# Patient Record
Sex: Female | Born: 1935 | Race: Black or African American | Hispanic: No | Smoking: Former smoker
Health system: Southern US, Community
[De-identification: ages and names within clinical notes are randomized; demographics above are authoritative.]

## PROBLEM LIST (undated history)

## (undated) DIAGNOSIS — I2699 Other pulmonary embolism without acute cor pulmonale: Secondary | ICD-10-CM

## (undated) DIAGNOSIS — M3500 Sicca syndrome, unspecified: Secondary | ICD-10-CM

## (undated) DIAGNOSIS — M1990 Unspecified osteoarthritis, unspecified site: Secondary | ICD-10-CM

---

## 1999-03-02 ENCOUNTER — Other Ambulatory Visit: Admission: RE | Admit: 1999-03-02 | Discharge: 1999-03-02 | Payer: Self-pay | Admitting: Internal Medicine

## 1999-03-17 ENCOUNTER — Ambulatory Visit (HOSPITAL_COMMUNITY): Admission: RE | Admit: 1999-03-17 | Discharge: 1999-03-17 | Payer: Self-pay | Admitting: Internal Medicine

## 1999-03-17 ENCOUNTER — Encounter: Payer: Self-pay | Admitting: Internal Medicine

## 2000-03-06 ENCOUNTER — Other Ambulatory Visit: Admission: RE | Admit: 2000-03-06 | Discharge: 2000-03-06 | Payer: Self-pay | Admitting: Internal Medicine

## 2000-04-10 ENCOUNTER — Ambulatory Visit (HOSPITAL_COMMUNITY): Admission: RE | Admit: 2000-04-10 | Discharge: 2000-04-10 | Payer: Self-pay | Admitting: Internal Medicine

## 2000-04-10 ENCOUNTER — Encounter: Payer: Self-pay | Admitting: Internal Medicine

## 2001-09-27 ENCOUNTER — Other Ambulatory Visit: Admission: RE | Admit: 2001-09-27 | Discharge: 2001-09-27 | Payer: Self-pay | Admitting: Internal Medicine

## 2001-10-10 ENCOUNTER — Encounter: Payer: Self-pay | Admitting: Internal Medicine

## 2001-10-10 ENCOUNTER — Encounter: Admission: RE | Admit: 2001-10-10 | Discharge: 2001-10-10 | Payer: Self-pay | Admitting: Internal Medicine

## 2001-10-17 ENCOUNTER — Encounter: Payer: Self-pay | Admitting: Internal Medicine

## 2001-10-17 ENCOUNTER — Ambulatory Visit (HOSPITAL_COMMUNITY): Admission: RE | Admit: 2001-10-17 | Discharge: 2001-10-17 | Payer: Self-pay | Admitting: Internal Medicine

## 2005-12-08 ENCOUNTER — Other Ambulatory Visit: Admission: RE | Admit: 2005-12-08 | Discharge: 2005-12-08 | Payer: Self-pay | Admitting: Internal Medicine

## 2006-01-18 ENCOUNTER — Ambulatory Visit (HOSPITAL_COMMUNITY): Admission: RE | Admit: 2006-01-18 | Discharge: 2006-01-18 | Payer: Self-pay | Admitting: Internal Medicine

## 2006-04-12 ENCOUNTER — Ambulatory Visit (HOSPITAL_COMMUNITY): Admission: RE | Admit: 2006-04-12 | Discharge: 2006-04-12 | Payer: Self-pay | Admitting: Internal Medicine

## 2007-10-23 ENCOUNTER — Emergency Department (HOSPITAL_COMMUNITY): Admission: EM | Admit: 2007-10-23 | Discharge: 2007-10-23 | Payer: Self-pay | Admitting: Emergency Medicine

## 2014-10-21 DIAGNOSIS — H35363 Drusen (degenerative) of macula, bilateral: Secondary | ICD-10-CM | POA: Diagnosis not present

## 2014-10-21 DIAGNOSIS — H2513 Age-related nuclear cataract, bilateral: Secondary | ICD-10-CM | POA: Diagnosis not present

## 2015-01-14 DIAGNOSIS — L438 Other lichen planus: Secondary | ICD-10-CM | POA: Diagnosis not present

## 2015-01-22 DIAGNOSIS — L281 Prurigo nodularis: Secondary | ICD-10-CM | POA: Diagnosis not present

## 2015-01-22 DIAGNOSIS — E538 Deficiency of other specified B group vitamins: Secondary | ICD-10-CM | POA: Diagnosis not present

## 2015-01-22 DIAGNOSIS — R634 Abnormal weight loss: Secondary | ICD-10-CM | POA: Diagnosis not present

## 2015-01-22 DIAGNOSIS — M150 Primary generalized (osteo)arthritis: Secondary | ICD-10-CM | POA: Diagnosis not present

## 2015-01-22 DIAGNOSIS — F0390 Unspecified dementia without behavioral disturbance: Secondary | ICD-10-CM | POA: Diagnosis not present

## 2015-01-22 DIAGNOSIS — M069 Rheumatoid arthritis, unspecified: Secondary | ICD-10-CM | POA: Diagnosis not present

## 2015-01-22 DIAGNOSIS — M1029 Drug-induced gout, multiple sites: Secondary | ICD-10-CM | POA: Diagnosis not present

## 2015-01-22 DIAGNOSIS — Q809 Congenital ichthyosis, unspecified: Secondary | ICD-10-CM | POA: Diagnosis not present

## 2015-02-03 DIAGNOSIS — M3500 Sicca syndrome, unspecified: Secondary | ICD-10-CM | POA: Diagnosis not present

## 2015-02-03 DIAGNOSIS — M069 Rheumatoid arthritis, unspecified: Secondary | ICD-10-CM | POA: Diagnosis not present

## 2015-02-03 DIAGNOSIS — M25551 Pain in right hip: Secondary | ICD-10-CM | POA: Diagnosis not present

## 2017-11-29 ENCOUNTER — Emergency Department (HOSPITAL_BASED_OUTPATIENT_CLINIC_OR_DEPARTMENT_OTHER)
Admission: EM | Admit: 2017-11-29 | Discharge: 2017-11-29 | Disposition: A | Discharge status: Home / Self Care | Payer: Medicare Other | Attending: Emergency Medicine | Admitting: Emergency Medicine

## 2017-11-29 ENCOUNTER — Other Ambulatory Visit: Payer: Self-pay

## 2017-11-29 ENCOUNTER — Emergency Department (HOSPITAL_BASED_OUTPATIENT_CLINIC_OR_DEPARTMENT_OTHER): Payer: Medicare Other

## 2017-11-29 ENCOUNTER — Encounter (HOSPITAL_BASED_OUTPATIENT_CLINIC_OR_DEPARTMENT_OTHER): Payer: Self-pay | Admitting: Emergency Medicine

## 2017-11-29 DIAGNOSIS — Z79899 Other long term (current) drug therapy: Secondary | ICD-10-CM | POA: Insufficient documentation

## 2017-11-29 DIAGNOSIS — W010XXA Fall on same level from slipping, tripping and stumbling without subsequent striking against object, initial encounter: Secondary | ICD-10-CM | POA: Insufficient documentation

## 2017-11-29 DIAGNOSIS — S42292A Other displaced fracture of upper end of left humerus, initial encounter for closed fracture: Secondary | ICD-10-CM | POA: Insufficient documentation

## 2017-11-29 DIAGNOSIS — Y9301 Activity, walking, marching and hiking: Secondary | ICD-10-CM | POA: Insufficient documentation

## 2017-11-29 DIAGNOSIS — Y92008 Other place in unspecified non-institutional (private) residence as the place of occurrence of the external cause: Secondary | ICD-10-CM | POA: Insufficient documentation

## 2017-11-29 DIAGNOSIS — Y999 Unspecified external cause status: Secondary | ICD-10-CM | POA: Insufficient documentation

## 2017-11-29 DIAGNOSIS — S4992XA Unspecified injury of left shoulder and upper arm, initial encounter: Secondary | ICD-10-CM | POA: Diagnosis present

## 2017-11-29 HISTORY — DX: Unspecified osteoarthritis, unspecified site: M19.90

## 2017-11-29 LAB — CBC WITH DIFFERENTIAL/PLATELET
Basophils Absolute: 0 10*3/uL (ref 0.0–0.1)
Basophils Relative: 0 %
Eosinophils Absolute: 0.2 10*3/uL (ref 0.0–0.7)
Eosinophils Relative: 2 %
HCT: 32.2 % — ABNORMAL LOW (ref 36.0–46.0)
Hemoglobin: 11.7 g/dL — ABNORMAL LOW (ref 12.0–15.0)
Lymphocytes Relative: 20 %
Lymphs Abs: 1.5 10*3/uL (ref 0.7–4.0)
MCH: 29.3 pg (ref 26.0–34.0)
MCHC: 36.3 g/dL — ABNORMAL HIGH (ref 30.0–36.0)
MCV: 80.5 fL (ref 78.0–100.0)
Monocytes Absolute: 0.4 10*3/uL (ref 0.1–1.0)
Monocytes Relative: 5 %
Neutro Abs: 5.4 10*3/uL (ref 1.7–7.7)
Neutrophils Relative %: 73 %
Platelets: 284 10*3/uL (ref 150–400)
RBC: 4 MIL/uL (ref 3.87–5.11)
RDW: 16.6 % — ABNORMAL HIGH (ref 11.5–15.5)
WBC: 7.4 10*3/uL (ref 4.0–10.5)

## 2017-11-29 LAB — BASIC METABOLIC PANEL
Anion gap: 14 (ref 5–15)
BUN: 9 mg/dL (ref 8–23)
CO2: 24 mmol/L (ref 22–32)
Calcium: 9.1 mg/dL (ref 8.9–10.3)
Chloride: 101 mmol/L (ref 98–111)
Creatinine, Ser: 0.56 mg/dL (ref 0.44–1.00)
GFR calc Af Amer: >60 mL/min (ref >60)
GFR calc non Af Amer: >60 mL/min (ref >60)
Glucose, Bld: 125 mg/dL — ABNORMAL HIGH (ref 70–99)
Potassium: 3.8 mmol/L (ref 3.5–5.1)
Sodium: 139 mmol/L (ref 135–145)

## 2017-11-29 MED ORDER — LORAZEPAM 2 MG/ML IJ SOLN
0.5000 mg | Freq: Once | INTRAMUSCULAR | Status: AC
  Administered 2017-11-29: 0.5 mg via INTRAVENOUS
  Filled 2017-11-29: qty 1

## 2017-11-29 MED ORDER — OXYCODONE-ACETAMINOPHEN 5-325 MG PO TABS: 1.0000 | ORAL_TABLET | ORAL | 0 refills | Status: DC | PRN

## 2017-11-29 MED ORDER — SODIUM CHLORIDE 0.9 % IV SOLN
INTRAVENOUS | Status: DC
Start: 2017-11-29 — End: 2017-11-30
  Administered 2017-11-29: 20:00:00 via INTRAVENOUS

## 2017-11-29 MED ORDER — HYDROMORPHONE HCL 1 MG/ML IJ SOLN
0.5000 mg | Freq: Once | INTRAMUSCULAR | Status: AC
  Administered 2017-11-29: 0.5 mg via INTRAVENOUS
  Filled 2017-11-29: qty 1

## 2017-11-29 NOTE — ED Notes (Signed)
ED Provider at bedside. 

## 2017-11-29 NOTE — ED Triage Notes (Signed)
Patient tripped and fell on her porch this evening; co left shoulder pain; obvious deformity noted; good sensation and movement to left fingers.

## 2017-11-29 NOTE — ED Notes (Signed)
Patient transported to X-ray 

## 2017-11-29 NOTE — ED Provider Notes (Signed)
[facility] EMERGENCY DEPARTMENT Provider Note   CSN: [id number] Arrival date & time: 11/29/17  1922     History   Chief Complaint Chief Complaint  Patient presents with  . Shoulder Pain    HPI Connie Snyder is a 84 y.o. female.  HPI   84 year old female presenting with left shoulder pain.  She had a mechanical fall just prior to arrival.  She has had severe left shoulder pain since then.  She denies any significant pain elsewhere.  She is not anticoagulated.  She denies any numbness or tingling.  Past Medical History:  Diagnosis Date  . Arthritis     There are no active problems to display for this patient.   History reviewed. No pertinent surgical history.   OB History   None      Home Medications    Prior to Admission medications   Medication Sig Start Date End Date Taking? Authorizing Provider  donepezil (ARICEPT) 5 MG tablet  11/27/17   [provider]  memantine (NAMENDA) 5 MG tablet Take 5 mg by mouth daily. 11/20/17   [provider]  methotrexate (RHEUMATREX) 2.5 MG tablet Take 15 mg by mouth once a week. 11/13/17   [provider]  mirtazapine (REMERON) 15 MG tablet Take 15 mg by mouth every evening. 09/02/17   [provider]  mirtazapine (REMERON) 30 MG tablet TAKE 1 TABLET BEFORE BEDTIME 11/01/17   [provider]  triamterene-hydrochlorothiazide (MAXZIDE-25) 37.5-25 MG tablet Take 0.5 tablets by mouth daily. 09/06/17   [provider]    Family History No family history on file.  Social History Social History   Tobacco Use  . Smoking status: Never Smoker  . Smokeless tobacco: Never Used  Substance Use Topics  . Alcohol use: Not on file  . Drug use: Not on file     Allergies   Patient has no known allergies.   Review of Systems Review of Systems  All systems reviewed and negative, other than as noted in HPI.  Physical Exam Updated Vital Signs BP (!) 154/96 (BP Location:  Right Arm)   Pulse 70   Temp 99.5 F (37.5 C) (Oral)   Resp (!) 25   Ht 5\' 6"  (1.676 m)   Wt 81.6 kg   SpO2 100%   BMI 29.05 kg/m   Physical Exam  Constitutional: She appears well-developed and well-nourished. No distress.  HENT:  Head: Normocephalic and atraumatic.  Eyes: Conjunctivae are normal. Right eye exhibits no discharge. Left eye exhibits no discharge.  Neck: Neck supple.  Cardiovascular: Normal rate, regular rhythm and normal heart sounds. Exam reveals no gallop and no friction rub.  No murmur heard. Pulmonary/Chest: Effort normal and breath sounds normal. No respiratory distress.  Abdominal: Soft. She exhibits no distension. There is no tenderness.  Musculoskeletal: She exhibits tenderness.  Deformity L proximal humerus/shoulder. Severe TTP. Ecchymosis and skin tenting as pictured. Can move all fingers. Good radial pulse. Hard to assess skin sensation over deltoid. Pt says is just hurts. Sensation intact to light touch distally to this area.   Neurological: She is alert.  Skin: Skin is warm and dry.  Psychiatric: She has a normal mood and affect. Her behavior is normal. Thought content normal.  Nursing note and vitals reviewed.        ED Treatments / Results  Labs (all labs ordered are listed, but only abnormal results are displayed) Labs Reviewed  CBC WITH DIFFERENTIAL/PLATELET - Abnormal; Notable for the following  components:      Result Value   Hemoglobin 11.7 (*)    HCT 32.2 (*)    MCHC 36.3 (*)    RDW 16.6 (*)    All other components within normal limits  BASIC METABOLIC PANEL - Abnormal; Notable for the following components:   Glucose, Bld 125 (*)    All other components within normal limits    EKG None  Radiology Dg Shoulder Left  Result Date: 11/29/2017 CLINICAL DATA:  84 year old who tripped and fell on her porch this evening and injured the LEFT shoulder. Initial encounter. EXAM: LEFT SHOULDER - 2+ VIEW COMPARISON:  LEFT shoulder x-rays and  MRI 04/12/2006. FINDINGS: Acute comminuted fracture involving the proximal LEFT humeral metaphysis. Avulsion fracture arising from the humeral head. Narrowed subacromial space. Degenerative changes involving the acromioclavicular joint. Glenohumeral joint anatomically aligned with narrowing of the joint space. Generalized osseous demineralization. IMPRESSION: 1. Acute comminuted fracture involving the proximal LEFT humeral metaphysis. 2. Avulsion fracture arising from the RIGHT head. 3. Narrowed subacromial space indicating chronic supraspinatus tendon disease. 4. Osseous demineralization. 5. Osteoarthritis involving the glenohumeral joint and the AC joint. Electronically Signed   By: Hulan Saas M.D.   On: 11/29/2017 21:03    Procedures Procedures (including critical care time)  Medications Ordered in ED Medications  0.9 %  sodium chloride infusion (has no administration in time range)  LORazepam (ATIVAN) injection 0.5 mg (has no administration in time range)  HYDROmorphone (DILAUDID) injection 0.5 mg (has no administration in time range)     Initial Impression / Assessment and Plan / ED Course  I have reviewed the triage vital signs and the nursing notes.  Pertinent labs & imaging results that were available during my care of the patient were reviewed by me and considered in my medical decision making (see chart for details).      I have reviewed the triage vital signs and the nursing notes. Prior records were reviewed for additional information.    Pertinent labs & imaging results that were available during my care of the patient were reviewed by me and considered in my medical decision making (see chart for details).   89yF with L shoulder pain after mechanical fall. Clinically proximal humerus fracture. Skin is intact but tenting and bony fragment palpable very superficially.   Imaging as above..  This case was discussed with Dr. Magnus Ivan, orthopedic surgery.  He would treat  this like a typical proximal humerus fracture in an elderly patient.  Sling.  As needed pain medication.  Follow-up in the office in the next few days for reassessment.  Care was discussed with patient and her son.  Return precautions discussed.  Final Clinical Impressions(s) / ED Diagnoses   Final diagnoses:  Other closed displaced fracture of proximal end of left humerus, initial encounter    ED Discharge Orders    None       Raeford Razor, MD 11/30/17 1902

## 2017-11-30 ENCOUNTER — Encounter (INDEPENDENT_AMBULATORY_CARE_PROVIDER_SITE_OTHER): Payer: Self-pay | Admitting: Orthopaedic Surgery

## 2017-11-30 ENCOUNTER — Ambulatory Visit (INDEPENDENT_AMBULATORY_CARE_PROVIDER_SITE_OTHER): Payer: Medicare Other | Admitting: Orthopaedic Surgery

## 2017-11-30 ENCOUNTER — Telehealth (INDEPENDENT_AMBULATORY_CARE_PROVIDER_SITE_OTHER): Payer: Self-pay

## 2017-11-30 DIAGNOSIS — S42202A Unspecified fracture of upper end of left humerus, initial encounter for closed fracture: Secondary | ICD-10-CM | POA: Insufficient documentation

## 2017-11-30 MED ORDER — OXYCODONE HCL 5 MG PO TABS: 5.0000 mg | ORAL_TABLET | ORAL | 0 refills | Status: DC | PRN

## 2017-11-30 NOTE — Progress Notes (Signed)
Office Visit Note   Patient: Connie Snyder           Date of Birth: [birth year]           MRN: [id number] Visit Date: 11/30/2017              Requested by: No referring provider defined for this encounter. PCP: Patient, No Pcp Per   Assessment & Plan: Visit Diagnoses:  1. Closed fracture of proximal end of left humerus, unspecified fracture morphology, initial encounter     Plan: Obviously given the combination of her advanced age combined with osteoporotic bone and her anxiety was dementia within a try to hold off on operative intervention.  I do feel that with a sling and gravity that the alignment will likely improve.  I would like to see her back in a week though.  At that visit I would like a single AP view of the left shoulder.  Her son agrees with this plan as well.  All question concerns were answered and addressed.  I will send in some oxycodone as well.  Follow-Up Instructions: Return in about 1 week (around 12/07/2017).   Orders:  No orders of the defined types were placed in this encounter.  Meds ordered this encounter  Medications  . oxyCODONE (ROXICODONE) 5 MG immediate release tablet    Sig: Take 1-2 tablets (5-10 mg total) by mouth every 4 (four) hours as needed for severe pain.    Dispense:  40 tablet    Refill:  0      Procedures: No procedures performed   Clinical Data: No additional findings.   Subjective: No chief complaint on file. The patient comes in with her son today.  She is 84 years old and has slight dementia and some anxiety and unfortunately sustained a mechanical fall last night injuring her left shoulder.  She was seen at [facility] and found to have a complex proximal humerus fracture.  There is slight tenting of the skin and she was placed appropriately sling and follow-up in our office today.  She does report significant left shoulder pain denies other injuries.  Again her son is with her.  HPI  Review of Systems She  currently denies any shortness of breath, chest pain, nausea, vomiting, fever, chills  Objective: Vital Signs: There were no vitals taken for this visit.  Physical Exam She is alert and does follow commands and in obvious discomfort Ortho Exam Examination of her left shoulder shows limited mobility of the shoulder secondary to her fracture.  There is bruising and slight tenting of the skin anteriorly but no significant soft tissue compromise. Specialty Comments:  No specialty comments available.  Imaging: Dg Shoulder Left  Result Date: 11/29/2017 CLINICAL DATA:  84 year old who tripped and fell on her porch this evening and injured the LEFT shoulder. Initial encounter. EXAM: LEFT SHOULDER - 2+ VIEW COMPARISON:  LEFT shoulder x-rays and MRI 04/12/2006. FINDINGS: Acute comminuted fracture involving the proximal LEFT humeral metaphysis. Avulsion fracture arising from the humeral head. Narrowed subacromial space. Degenerative changes involving the acromioclavicular joint. Glenohumeral joint anatomically aligned with narrowing of the joint space. Generalized osseous demineralization. IMPRESSION: 1. Acute comminuted fracture involving the proximal LEFT humeral metaphysis. 2. Avulsion fracture arising from the RIGHT head. 3. Narrowed subacromial space indicating chronic supraspinatus tendon disease. 4. Osseous demineralization. 5. Osteoarthritis involving the glenohumeral joint and the AC joint. Electronically Signed   By: Hulan Saas M.D.   On: 11/29/2017 21:03  Independent review of the x-rays of her left shoulder do show a 2 part to three-part proximal humerus fracture with significant displacement.  PMFS History: Patient Active Problem List   Diagnosis Date Noted  . Closed fracture of left proximal humerus 11/30/2017   Past Medical History:  Diagnosis Date  . Arthritis     History reviewed. No pertinent family history.  History reviewed. No pertinent surgical history. Social History     Occupational History  . Not on file  Tobacco Use  . Smoking status: Never Smoker  . Smokeless tobacco: Never Used  Substance and Sexual Activity  . Alcohol use: Not on file  . Drug use: Not on file  . Sexual activity: Not on file

## 2017-11-30 NOTE — Telephone Encounter (Signed)
Error

## 2017-12-07 ENCOUNTER — Other Ambulatory Visit (INDEPENDENT_AMBULATORY_CARE_PROVIDER_SITE_OTHER): Payer: Self-pay | Admitting: Orthopaedic Surgery

## 2017-12-07 ENCOUNTER — Encounter (INDEPENDENT_AMBULATORY_CARE_PROVIDER_SITE_OTHER): Payer: Self-pay | Admitting: Orthopaedic Surgery

## 2017-12-07 ENCOUNTER — Ambulatory Visit (INDEPENDENT_AMBULATORY_CARE_PROVIDER_SITE_OTHER): Payer: Medicare Other | Admitting: Orthopaedic Surgery

## 2017-12-07 ENCOUNTER — Ambulatory Visit (INDEPENDENT_AMBULATORY_CARE_PROVIDER_SITE_OTHER): Payer: Self-pay

## 2017-12-07 DIAGNOSIS — S42202A Unspecified fracture of upper end of left humerus, initial encounter for closed fracture: Secondary | ICD-10-CM

## 2017-12-07 MED ORDER — OXYCODONE HCL 5 MG PO TABS: 5.0000 mg | ORAL_TABLET | ORAL | 0 refills | Status: DC | PRN

## 2017-12-07 NOTE — Progress Notes (Signed)
The patient is a very elderly 84 year old female who is following up after having sustained a 2 part left proximal humerus fracture.  With that in a sling.  We have been worried about soft tissue compromise due to tenting of the skin.  With gravity and time the skin tenting is less.  There is still some swelling but there is not the same soft tissue compromise we worried about a week ago.  She is moving her elbow and hand fine.  She is been tolerating the pain better.  This is certainly complex fracture.  The x-rays today show that it is displaced proximal humerus fracture.  We will see her in 2 weeks with internal and external rotated views of the left shoulder.  Certainly may still need to proceed with an operative intervention if this does not head in the right direction in terms of healing or soft tissue issues.  The family understands this as well.  We will see what she looks like in 2 weeks.

## 2017-12-21 ENCOUNTER — Encounter (HOSPITAL_BASED_OUTPATIENT_CLINIC_OR_DEPARTMENT_OTHER): Payer: Self-pay

## 2017-12-21 ENCOUNTER — Ambulatory Visit (INDEPENDENT_AMBULATORY_CARE_PROVIDER_SITE_OTHER): Payer: Medicare Other

## 2017-12-21 ENCOUNTER — Other Ambulatory Visit: Payer: Self-pay

## 2017-12-21 ENCOUNTER — Emergency Department (HOSPITAL_BASED_OUTPATIENT_CLINIC_OR_DEPARTMENT_OTHER): Payer: Medicare Other

## 2017-12-21 ENCOUNTER — Ambulatory Visit (INDEPENDENT_AMBULATORY_CARE_PROVIDER_SITE_OTHER): Payer: Medicare Other | Admitting: Orthopaedic Surgery

## 2017-12-21 ENCOUNTER — Inpatient Hospital Stay (HOSPITAL_BASED_OUTPATIENT_CLINIC_OR_DEPARTMENT_OTHER)
Admission: EM | Admit: 2017-12-21 | Discharge: 2017-12-28 | DRG: 470 | Disposition: A | Discharge status: Skilled Nursing Facility | Payer: Medicare Other | Attending: Internal Medicine | Admitting: Internal Medicine

## 2017-12-21 ENCOUNTER — Encounter (INDEPENDENT_AMBULATORY_CARE_PROVIDER_SITE_OTHER): Payer: Self-pay | Admitting: Orthopaedic Surgery

## 2017-12-21 DIAGNOSIS — S42202D Unspecified fracture of upper end of left humerus, subsequent encounter for fracture with routine healing: Secondary | ICD-10-CM

## 2017-12-21 DIAGNOSIS — I447 Left bundle-branch block, unspecified: Secondary | ICD-10-CM | POA: Diagnosis not present

## 2017-12-21 DIAGNOSIS — M25552 Pain in left hip: Secondary | ICD-10-CM | POA: Diagnosis present

## 2017-12-21 DIAGNOSIS — D62 Acute posthemorrhagic anemia: Secondary | ICD-10-CM | POA: Diagnosis not present

## 2017-12-21 DIAGNOSIS — S72002A Fracture of unspecified part of neck of left femur, initial encounter for closed fracture: Secondary | ICD-10-CM | POA: Diagnosis not present

## 2017-12-21 DIAGNOSIS — E440 Moderate protein-calorie malnutrition: Secondary | ICD-10-CM | POA: Diagnosis not present

## 2017-12-21 DIAGNOSIS — F0390 Unspecified dementia without behavioral disturbance: Secondary | ICD-10-CM | POA: Diagnosis not present

## 2017-12-21 DIAGNOSIS — Z7982 Long term (current) use of aspirin: Secondary | ICD-10-CM | POA: Diagnosis not present

## 2017-12-21 DIAGNOSIS — S72042A Displaced fracture of base of neck of left femur, initial encounter for closed fracture: Secondary | ICD-10-CM | POA: Diagnosis not present

## 2017-12-21 DIAGNOSIS — D649 Anemia, unspecified: Secondary | ICD-10-CM | POA: Diagnosis not present

## 2017-12-21 DIAGNOSIS — M069 Rheumatoid arthritis, unspecified: Secondary | ICD-10-CM | POA: Diagnosis not present

## 2017-12-21 DIAGNOSIS — W19XXXA Unspecified fall, initial encounter: Secondary | ICD-10-CM

## 2017-12-21 DIAGNOSIS — Z8781 Personal history of (healed) traumatic fracture: Secondary | ICD-10-CM

## 2017-12-21 DIAGNOSIS — Z419 Encounter for procedure for purposes other than remedying health state, unspecified: Secondary | ICD-10-CM

## 2017-12-21 DIAGNOSIS — Z6829 Body mass index (BMI) 29.0-29.9, adult: Secondary | ICD-10-CM

## 2017-12-21 DIAGNOSIS — S42202A Unspecified fracture of upper end of left humerus, initial encounter for closed fracture: Secondary | ICD-10-CM | POA: Diagnosis present

## 2017-12-21 DIAGNOSIS — Z9181 History of falling: Secondary | ICD-10-CM

## 2017-12-21 DIAGNOSIS — M3500 Sicca syndrome, unspecified: Secondary | ICD-10-CM | POA: Diagnosis not present

## 2017-12-21 DIAGNOSIS — T148XXA Other injury of unspecified body region, initial encounter: Secondary | ICD-10-CM

## 2017-12-21 DIAGNOSIS — W19XXXD Unspecified fall, subsequent encounter: Secondary | ICD-10-CM | POA: Diagnosis present

## 2017-12-21 DIAGNOSIS — Z96649 Presence of unspecified artificial hip joint: Secondary | ICD-10-CM

## 2017-12-21 DIAGNOSIS — I10 Essential (primary) hypertension: Secondary | ICD-10-CM | POA: Diagnosis present

## 2017-12-21 DIAGNOSIS — W108XXA Fall (on) (from) other stairs and steps, initial encounter: Secondary | ICD-10-CM | POA: Diagnosis present

## 2017-12-21 HISTORY — DX: Sjogren syndrome, unspecified: M35.00

## 2017-12-21 LAB — BASIC METABOLIC PANEL
Anion gap: 9 (ref 5–15)
BUN: 11 mg/dL (ref 8–23)
CALCIUM: 8.8 mg/dL — AB (ref 8.9–10.3)
CO2: 28 mmol/L (ref 22–32)
Chloride: 104 mmol/L (ref 98–111)
Creatinine, Ser: 0.48 mg/dL (ref 0.44–1.00)
GFR calc non Af Amer: >60 mL/min (ref >60)
Glucose, Bld: 97 mg/dL (ref 70–99)
Potassium: 4 mmol/L (ref 3.5–5.1)
SODIUM: 141 mmol/L (ref 135–145)

## 2017-12-21 LAB — CBC WITH DIFFERENTIAL/PLATELET
BASOS PCT: 1 %
Basophils Absolute: 0 10*3/uL (ref 0.0–0.1)
Eosinophils Absolute: 0.2 10*3/uL (ref 0.0–0.7)
Eosinophils Relative: 3 %
HCT: 30.1 % — ABNORMAL LOW (ref 36.0–46.0)
HEMOGLOBIN: 10.7 g/dL — AB (ref 12.0–15.0)
Lymphocytes Relative: 10 %
Lymphs Abs: 0.8 10*3/uL (ref 0.7–4.0)
MCH: 29.4 pg (ref 26.0–34.0)
MCHC: 35.5 g/dL (ref 30.0–36.0)
MCV: 82.7 fL (ref 78.0–100.0)
MONOS PCT: 7 %
Monocytes Absolute: 0.5 10*3/uL (ref 0.1–1.0)
NEUTROS PCT: 79 %
Neutro Abs: 6.2 10*3/uL (ref 1.7–7.7)
PLATELETS: 416 10*3/uL — AB (ref 150–400)
RBC: 3.64 MIL/uL — AB (ref 3.87–5.11)
RDW: 16.2 % — ABNORMAL HIGH (ref 11.5–15.5)
WBC: 7.8 10*3/uL (ref 4.0–10.5)

## 2017-12-21 MED ORDER — MORPHINE SULFATE (PF) 4 MG/ML IV SOLN
4.0000 mg | INTRAVENOUS | Status: AC | PRN
  Administered 2017-12-21 (×2): 4 mg via INTRAVENOUS
  Filled 2017-12-21 (×2): qty 1

## 2017-12-21 MED ORDER — ONDANSETRON HCL 4 MG/2ML IJ SOLN
4.0000 mg | Freq: Four times a day (QID) | INTRAMUSCULAR | Status: DC | PRN
  Administered 2017-12-21: 4 mg via INTRAVENOUS
  Filled 2017-12-21: qty 2

## 2017-12-21 MED ORDER — OXYCODONE HCL 5 MG PO TABS: 5.0000 mg | ORAL_TABLET | ORAL | 0 refills | Status: DC | PRN

## 2017-12-21 NOTE — ED Notes (Signed)
[facility] notified (Kim) - hospitalist consult @ [facility] 

## 2017-12-21 NOTE — ED Triage Notes (Addendum)
Pt was walking into her house and lost her footing and fell, c/o left upper leg and hip pain, already has a broken left arm that is in a sling and apparently just recovered from an injury to the right leg, no shortening, no rotation, but pt does not want to extend leg and is tender to touch

## 2017-12-21 NOTE — ED Notes (Signed)
Attempted to call report to 5N; spoke with Greggory Stallion, RN; he will call back for report.

## 2017-12-21 NOTE — Plan of Care (Signed)
  Problem: Education: Goal: Knowledge of General Education information will improve Description: Including pain rating scale, medication(s)/side effects and non-pharmacologic comfort measures Outcome: Progressing   Problem: Pain Managment: Goal: General experience of comfort will improve Outcome: Progressing   Problem: Safety: Goal: Ability to remain free from injury will improve Outcome: Progressing   

## 2017-12-21 NOTE — ED Notes (Signed)
Attempted to obtain IV access x 2

## 2017-12-21 NOTE — Progress Notes (Addendum)
Transfer from [facility] for hip fracture, secondary to mechanical fall.  Recent closed fracture of the humerus 3 weeks ago, follows with Dr. Magnus Ivan today.  Dr. Roda Shutters with [facility] orthopedist will see patient.  Admit order inpatient MedSurg.  Wendall Stade, MD. TRH.  LOS- NO CHARGE.

## 2017-12-21 NOTE — ED Notes (Signed)
Report to Greggory Stallion, RN at [facility]N.

## 2017-12-21 NOTE — ED Provider Notes (Signed)
[facility] EMERGENCY DEPARTMENT Provider Note   CSN: [id number] Arrival date & time: 12/21/17  1751     History   Chief Complaint Chief Complaint  Patient presents with  . Fall    HPI Connie Snyder is a 84 y.o. female, Sjgrens and arthritis, presents s/p mechanical fall. Pt states she was walking up the steps to her house when she missed a step and fell. She landed on her left hip. Pt's son at bedside was present for the fall and verified HPI. Pt denies hitting her head, LOC. Pt has a known left humeral fx from a fall 3 weeks ago. She has been followed by Dr. Magnus Ivan for that fracture. Pt denies any other pain. She denies any numbness, tingling, weakness, syncope. The history is provided by the patient and a relative.  Fall  This is a new problem. The current episode started less than 1 hour ago. Pertinent negatives include no chest pain, no abdominal pain, no headaches and no shortness of breath.    Past Medical History:  Diagnosis Date  . Arthritis   . Sjogren's disease [facility])     Patient Active Problem List   Diagnosis Date Noted  . Closed fracture of left proximal humerus 11/30/2017    History reviewed. No pertinent surgical history.   OB History   None      Home Medications    Prior to Admission medications   Medication Sig Start Date End Date Taking? Authorizing Provider  donepezil (ARICEPT) 5 MG tablet  11/27/17   [provider]  memantine (NAMENDA) 5 MG tablet Take 5 mg by mouth daily. 11/20/17   [provider]  methotrexate (RHEUMATREX) 2.5 MG tablet Take 15 mg by mouth once a week. 11/13/17   [provider]  mirtazapine (REMERON) 15 MG tablet Take 15 mg by mouth every evening. 09/02/17   [provider]  mirtazapine (REMERON) 30 MG tablet TAKE 1 TABLET BEFORE BEDTIME 11/01/17   [provider]  oxyCODONE (ROXICODONE) 5 MG immediate release tablet Take 1-2 tablets (5-10 mg total) by mouth every 4  (four) hours as needed for severe pain. 12/21/17   Kathryne Hitch, MD  oxyCODONE-acetaminophen (PERCOCET/ROXICET) 5-325 MG tablet Take 1 tablet by mouth every 4 (four) hours as needed for severe pain. 11/29/17   Raeford Razor, MD  triamterene-hydrochlorothiazide (MAXZIDE-25) 37.5-25 MG tablet Take 0.5 tablets by mouth daily. 09/06/17   [provider]    Family History No family history on file.  Social History Social History   Tobacco Use  . Smoking status: Never Smoker  . Smokeless tobacco: Never Used  Substance Use Topics  . Alcohol use: Not on file  . Drug use: Not on file     Allergies   Patient has no known allergies.   Review of Systems Review of Systems  Constitutional: Negative for chills and fever.  HENT: Negative for rhinorrhea and sore throat.   Eyes: Negative for visual disturbance.  Respiratory: Negative for cough and shortness of breath.   Cardiovascular: Negative for chest pain and leg swelling.  Gastrointestinal: Negative for abdominal pain, diarrhea, nausea and vomiting.  Genitourinary: Negative for dysuria, frequency and urgency.  Musculoskeletal: Positive for arthralgias (left hip pain). Negative for back pain and neck pain.  Skin: Negative for rash and wound.  Neurological: Negative for syncope, numbness and headaches.  All other systems reviewed and are negative.    Physical Exam Updated Vital Signs BP (!) 160/71 (BP Location: Right  Arm)   Pulse 87   Temp 100.1 F (37.8 C) (Oral)   Resp 20   Ht 5\' 6"  (1.676 m)   Wt 81.6 kg   SpO2 100%   BMI 29.04 kg/m   Physical Exam  Constitutional: She is oriented to person, place, and time. She appears well-developed and well-nourished.  HENT:  Head: Normocephalic and atraumatic.  Eyes: Conjunctivae and EOM are normal.  Neck: Neck supple.  Cardiovascular: Normal rate, regular rhythm and normal heart sounds.  No murmur heard. Pulmonary/Chest: Effort normal and breath sounds normal. No  respiratory distress. She has no wheezes. She has no rales.  Abdominal: Soft. Bowel sounds are normal. She exhibits no distension. There is no tenderness.  Musculoskeletal:       Left shoulder: She exhibits decreased range of motion (known left humeral fx, skin tenting noted, no change from imaging several weeks ago), tenderness and bony tenderness.       Left hip: She exhibits decreased range of motion and bony tenderness (tender over the lateral left hip at the femoral head). She exhibits no swelling and no crepitus.  Left leg externally rotated and shortened  Neurological: She is alert and oriented to person, place, and time.  Skin: Skin is warm and dry. No rash noted. No erythema.  Nursing note and vitals reviewed.    ED Treatments / Results  Labs (all labs ordered are listed, but only abnormal results are displayed) Labs Reviewed  BASIC METABOLIC PANEL  CBC WITH DIFFERENTIAL/PLATELET    EKG None  Radiology Xr Humerus Left  Result Date: 12/21/2017 2 views of the left proximal humerus show a 2 part proximal humerus fracture with slight interval healing.   Procedures Procedures (including critical care time)  Medications Ordered in ED Medications  morphine 4 MG/ML injection 4 mg (has no administration in time range)  ondansetron (ZOFRAN) injection 4 mg (has no administration in time range)     Initial Impression / Assessment and Plan / ED Course  I have reviewed the triage vital signs and the nursing notes.  Pertinent labs & imaging results that were available during my care of the patient were reviewed by me and considered in my medical decision making (see chart for details).  Clinical Course as of Dec 21 2000  Thu Dec 21, 2017  1931 DG FEMUR MIN 2 VIEWS LEFT [CK]    Clinical Course User Index [CK] Dec 23, 2017, PA-C    8:03 PM Pt has left hip fracture. Pt resting comfortably in bed, no acute distress. Pt declined more pain medication. Spoke with Dr.  Clayborne Artist who states he has a full schedule tomorrow. Recommends admitting to [facility] hospitalitis for Dr. [facility] to do the case.  8:16 PM Dr. Rhona Leavens accepted pt to [facility]   Final Clinical Impressions(s) / ED Diagnoses   Final diagnoses:  None    ED Discharge Orders    None       [facility], PA-C 12/21/17 2355    12/23/17, DO 12/22/17 [phone]

## 2017-12-21 NOTE — ED Notes (Signed)
Pt assisted to use bedpan; voided w/o difficulty

## 2017-12-21 NOTE — Progress Notes (Signed)
The patient is a 84 year old who is now 3 weeks status post a complex left proximal humerus fracture.  All this was just a 2 part fracture, it was causing some soft tissue compromise.  With patterns sling.  She is actually feeling better this recently.  Out of the sling I can put her through some internal extra rotation it seems to move the unit and she seems to be less uncomfortable.  Her son is with her says most of her pain is at night.  He does wish to have a refill of oxycodone.  2 views of the left shoulder show that the humeral head is well located.  I do feel the alignment of the fracture is improving.  At this point given her improved clinical exam, we will have her still use a sling with coming in and out of it as comfort allows.  I like to see her back in 4 weeks with an AP of the left shoulder.

## 2017-12-22 ENCOUNTER — Encounter (HOSPITAL_COMMUNITY): Payer: Self-pay | Admitting: Internal Medicine

## 2017-12-22 ENCOUNTER — Inpatient Hospital Stay (HOSPITAL_COMMUNITY): Payer: Medicare Other

## 2017-12-22 ENCOUNTER — Inpatient Hospital Stay (HOSPITAL_COMMUNITY): Payer: Medicare Other | Admitting: Anesthesiology

## 2017-12-22 ENCOUNTER — Encounter (HOSPITAL_COMMUNITY)
Admission: EM | Disposition: A | Discharge status: Skilled Nursing Facility | Payer: Self-pay | Source: Home / Self Care | Attending: Internal Medicine

## 2017-12-22 DIAGNOSIS — Z8781 Personal history of (healed) traumatic fracture: Secondary | ICD-10-CM

## 2017-12-22 DIAGNOSIS — S72042A Displaced fracture of base of neck of left femur, initial encounter for closed fracture: Secondary | ICD-10-CM

## 2017-12-22 DIAGNOSIS — E440 Moderate protein-calorie malnutrition: Secondary | ICD-10-CM

## 2017-12-22 DIAGNOSIS — D649 Anemia, unspecified: Secondary | ICD-10-CM | POA: Diagnosis present

## 2017-12-22 HISTORY — PX: TOTAL HIP ARTHROPLASTY: SHX124

## 2017-12-22 LAB — CBC
HCT: 28.5 % — ABNORMAL LOW (ref 36.0–46.0)
HEMATOCRIT: 30.4 % — AB (ref 36.0–46.0)
HEMOGLOBIN: 10.1 g/dL — AB (ref 12.0–15.0)
HEMOGLOBIN: 10.4 g/dL — AB (ref 12.0–15.0)
MCH: 29.7 pg (ref 26.0–34.0)
MCH: 28.9 pg (ref 26.0–34.0)
MCHC: 35.4 g/dL (ref 30.0–36.0)
MCHC: 34.2 g/dL (ref 30.0–36.0)
MCV: 83.8 fL (ref 78.0–100.0)
MCV: 84.4 fL (ref 78.0–100.0)
PLATELETS: 345 10*3/uL (ref 150–400)
Platelets: 372 10*3/uL (ref 150–400)
RBC: 3.4 MIL/uL — ABNORMAL LOW (ref 3.87–5.11)
RBC: 3.6 MIL/uL — ABNORMAL LOW (ref 3.87–5.11)
RDW: 15.9 % — ABNORMAL HIGH (ref 11.5–15.5)
RDW: 16.2 % — AB (ref 11.5–15.5)
WBC: 10.9 10*3/uL — ABNORMAL HIGH (ref 4.0–10.5)
WBC: 19.3 10*3/uL — AB (ref 4.0–10.5)

## 2017-12-22 LAB — URINALYSIS, ROUTINE W REFLEX MICROSCOPIC
Bilirubin Urine: NEGATIVE
GLUCOSE, UA: NEGATIVE mg/dL
HGB URINE DIPSTICK: NEGATIVE
Ketones, ur: NEGATIVE mg/dL
Leukocytes, UA: NEGATIVE
Nitrite: NEGATIVE
PROTEIN: NEGATIVE mg/dL
Specific Gravity, Urine: 1.011 (ref 1.005–1.030)
pH: 9 — ABNORMAL HIGH (ref 5.0–8.0)

## 2017-12-22 LAB — SURGICAL PCR SCREEN
MRSA, PCR: NEGATIVE
STAPHYLOCOCCUS AUREUS: POSITIVE — AB

## 2017-12-22 LAB — ABO/RH: ABO/RH(D): B POS

## 2017-12-22 LAB — BASIC METABOLIC PANEL
ANION GAP: 7 (ref 5–15)
BUN: 7 mg/dL — ABNORMAL LOW (ref 8–23)
CALCIUM: 8.6 mg/dL — AB (ref 8.9–10.3)
CO2: 26 mmol/L (ref 22–32)
CREATININE: 0.52 mg/dL (ref 0.44–1.00)
Chloride: 105 mmol/L (ref 98–111)
GFR calc Af Amer: >60 mL/min (ref >60)
GLUCOSE: 107 mg/dL — AB (ref 70–99)
Potassium: 4.1 mmol/L (ref 3.5–5.1)
Sodium: 138 mmol/L (ref 135–145)

## 2017-12-22 LAB — CREATININE, SERUM: CREATININE: 0.54 mg/dL (ref 0.44–1.00)

## 2017-12-22 LAB — TROPONIN I

## 2017-12-22 SURGERY — ARTHROPLASTY, HIP, TOTAL, ANTERIOR APPROACH
Anesthesia: General | Site: Hip | Laterality: Left

## 2017-12-22 MED ORDER — SENNOSIDES-DOCUSATE SODIUM 8.6-50 MG PO TABS: 1.0000 | ORAL_TABLET | Freq: Every evening | ORAL | Status: DC | PRN

## 2017-12-22 MED ORDER — MEMANTINE HCL 10 MG PO TABS
5.0000 mg | ORAL_TABLET | Freq: Every day | ORAL | Status: DC
  Administered 2017-12-23 – 2017-12-28 (×6): 5 mg via ORAL
  Filled 2017-12-22 (×6): qty 1

## 2017-12-22 MED ORDER — POVIDONE-IODINE 10 % EX SWAB: 2.0000 "application " | Freq: Once | CUTANEOUS | Status: DC

## 2017-12-22 MED ORDER — ACETAMINOPHEN 325 MG PO TABS
325.0000 mg | ORAL_TABLET | Freq: Four times a day (QID) | ORAL | Status: DC | PRN
  Administered 2017-12-23: 325 mg via ORAL
  Administered 2017-12-24 – 2017-12-28 (×5): 650 mg via ORAL
  Filled 2017-12-22 (×2): qty 2
  Filled 2017-12-22: qty 1
  Filled 2017-12-22 (×3): qty 2

## 2017-12-22 MED ORDER — FENTANYL CITRATE (PF) 100 MCG/2ML IJ SOLN
INTRAMUSCULAR | Status: AC
  Filled 2017-12-22: qty 2

## 2017-12-22 MED ORDER — POLYETHYLENE GLYCOL 3350 17 G PO PACK: 17.0000 g | PACK | Freq: Every day | ORAL | Status: DC | PRN

## 2017-12-22 MED ORDER — METOCLOPRAMIDE HCL 5 MG/ML IJ SOLN
5.0000 mg | Freq: Once | INTRAMUSCULAR | Status: AC
  Administered 2017-12-22: 5 mg via INTRAVENOUS

## 2017-12-22 MED ORDER — ACETAMINOPHEN 500 MG PO TABS
500.0000 mg | ORAL_TABLET | Freq: Four times a day (QID) | ORAL | Status: AC
  Administered 2017-12-22 – 2017-12-23 (×4): 500 mg via ORAL
  Filled 2017-12-22 (×4): qty 1

## 2017-12-22 MED ORDER — ONDANSETRON HCL 4 MG/2ML IJ SOLN: 4.0000 mg | Freq: Four times a day (QID) | INTRAMUSCULAR | Status: DC | PRN

## 2017-12-22 MED ORDER — ONDANSETRON HCL 4 MG PO TABS: 4.0000 mg | ORAL_TABLET | Freq: Four times a day (QID) | ORAL | Status: DC | PRN

## 2017-12-22 MED ORDER — PHENOL 1.4 % MT LIQD: 1.0000 | OROMUCOSAL | Status: DC | PRN

## 2017-12-22 MED ORDER — SUGAMMADEX SODIUM 200 MG/2ML IV SOLN
INTRAVENOUS | Status: DC | PRN
  Administered 2017-12-22: 200 mg via INTRAVENOUS

## 2017-12-22 MED ORDER — MORPHINE SULFATE (PF) 2 MG/ML IV SOLN: 1.0000 mg | INTRAVENOUS | Status: DC | PRN

## 2017-12-22 MED ORDER — HYDROCODONE-ACETAMINOPHEN 5-325 MG PO TABS
1.0000 | ORAL_TABLET | ORAL | Status: DC | PRN
  Administered 2017-12-27: 2 via ORAL
  Filled 2017-12-22: qty 2

## 2017-12-22 MED ORDER — ROCURONIUM BROMIDE 50 MG/5ML IV SOSY
PREFILLED_SYRINGE | INTRAVENOUS | Status: DC | PRN
  Administered 2017-12-22: 10 mg via INTRAVENOUS
  Administered 2017-12-22: 40 mg via INTRAVENOUS

## 2017-12-22 MED ORDER — SODIUM CHLORIDE 0.9 % IR SOLN
Status: DC | PRN
  Administered 2017-12-22: 3000 mL

## 2017-12-22 MED ORDER — DEXAMETHASONE SODIUM PHOSPHATE 10 MG/ML IJ SOLN
INTRAMUSCULAR | Status: AC
  Filled 2017-12-22: qty 1

## 2017-12-22 MED ORDER — CEFAZOLIN SODIUM-DEXTROSE 2-4 GM/100ML-% IV SOLN
2.0000 g | Freq: Four times a day (QID) | INTRAVENOUS | Status: AC
  Administered 2017-12-22 – 2017-12-23 (×3): 2 g via INTRAVENOUS
  Filled 2017-12-22 (×3): qty 100

## 2017-12-22 MED ORDER — LACTATED RINGERS IV SOLN
INTRAVENOUS | Status: DC
  Administered 2017-12-22 (×2): via INTRAVENOUS

## 2017-12-22 MED ORDER — SORBITOL 70 % SOLN: 30.0000 mL | Freq: Every day | Status: DC | PRN

## 2017-12-22 MED ORDER — FENTANYL CITRATE (PF) 250 MCG/5ML IJ SOLN
INTRAMUSCULAR | Status: AC
  Filled 2017-12-22: qty 5

## 2017-12-22 MED ORDER — 0.9 % SODIUM CHLORIDE (POUR BTL) OPTIME
TOPICAL | Status: DC | PRN
  Administered 2017-12-22: 1000 mL

## 2017-12-22 MED ORDER — ENOXAPARIN SODIUM 40 MG/0.4ML ~~LOC~~ SOLN: 40.0000 mg | Freq: Every day | SUBCUTANEOUS | 0 refills | Status: DC

## 2017-12-22 MED ORDER — MIRTAZAPINE 15 MG PO TABS
30.0000 mg | ORAL_TABLET | Freq: Every day | ORAL | Status: DC
  Administered 2017-12-22 – 2017-12-27 (×7): 30 mg via ORAL
  Filled 2017-12-22 (×7): qty 2

## 2017-12-22 MED ORDER — MORPHINE SULFATE (PF) 2 MG/ML IV SOLN
0.5000 mg | INTRAVENOUS | Status: DC | PRN
  Administered 2017-12-22: 0.5 mg via INTRAVENOUS
  Filled 2017-12-22: qty 1

## 2017-12-22 MED ORDER — LIDOCAINE 2% (20 MG/ML) 5 ML SYRINGE
INTRAMUSCULAR | Status: DC | PRN
  Administered 2017-12-22: 80 mg via INTRAVENOUS

## 2017-12-22 MED ORDER — LIDOCAINE 2% (20 MG/ML) 5 ML SYRINGE
INTRAMUSCULAR | Status: AC
  Filled 2017-12-22: qty 5

## 2017-12-22 MED ORDER — METHOCARBAMOL 1000 MG/10ML IJ SOLN
500.0000 mg | Freq: Four times a day (QID) | INTRAVENOUS | Status: DC | PRN
  Filled 2017-12-22: qty 5

## 2017-12-22 MED ORDER — VANCOMYCIN HCL 1000 MG IV SOLR
INTRAVENOUS | Status: DC | PRN
  Administered 2017-12-22: 1000 mg via TOPICAL

## 2017-12-22 MED ORDER — CEFAZOLIN SODIUM-DEXTROSE 2-4 GM/100ML-% IV SOLN
INTRAVENOUS | Status: AC
  Filled 2017-12-22: qty 100

## 2017-12-22 MED ORDER — CHLORHEXIDINE GLUCONATE CLOTH 2 % EX PADS
6.0000 | MEDICATED_PAD | Freq: Every day | CUTANEOUS | Status: AC
  Administered 2017-12-23 – 2017-12-26 (×4): 6 via TOPICAL

## 2017-12-22 MED ORDER — METOCLOPRAMIDE HCL 5 MG/ML IJ SOLN
INTRAMUSCULAR | Status: AC
  Filled 2017-12-22: qty 2

## 2017-12-22 MED ORDER — OXYCODONE-ACETAMINOPHEN 5-325 MG PO TABS: 1.0000 | ORAL_TABLET | ORAL | 0 refills | Status: DC | PRN

## 2017-12-22 MED ORDER — FENTANYL CITRATE (PF) 100 MCG/2ML IJ SOLN
25.0000 ug | INTRAMUSCULAR | Status: DC | PRN
  Administered 2017-12-22: 25 ug via INTRAVENOUS

## 2017-12-22 MED ORDER — VANCOMYCIN HCL 1000 MG IV SOLR
INTRAVENOUS | Status: AC
  Filled 2017-12-22: qty 1000

## 2017-12-22 MED ORDER — ALUM & MAG HYDROXIDE-SIMETH 200-200-20 MG/5ML PO SUSP: 30.0000 mL | ORAL | Status: DC | PRN

## 2017-12-22 MED ORDER — MENTHOL 3 MG MT LOZG: 1.0000 | LOZENGE | OROMUCOSAL | Status: DC | PRN

## 2017-12-22 MED ORDER — MUPIROCIN 2 % EX OINT
1.0000 "application " | TOPICAL_OINTMENT | Freq: Two times a day (BID) | CUTANEOUS | Status: AC
  Administered 2017-12-22 – 2017-12-26 (×10): 1 via NASAL
  Filled 2017-12-22 (×4): qty 22

## 2017-12-22 MED ORDER — DONEPEZIL HCL 5 MG PO TABS
5.0000 mg | ORAL_TABLET | Freq: Every day | ORAL | Status: DC
  Administered 2017-12-22 – 2017-12-27 (×7): 5 mg via ORAL
  Filled 2017-12-22 (×7): qty 1

## 2017-12-22 MED ORDER — ONDANSETRON HCL 4 MG/2ML IJ SOLN
INTRAMUSCULAR | Status: DC | PRN
  Administered 2017-12-22: 4 mg via INTRAVENOUS

## 2017-12-22 MED ORDER — MAGNESIUM CITRATE PO SOLN: 1.0000 | Freq: Once | ORAL | Status: DC | PRN

## 2017-12-22 MED ORDER — ROCURONIUM BROMIDE 50 MG/5ML IV SOSY
PREFILLED_SYRINGE | INTRAVENOUS | Status: AC
  Filled 2017-12-22: qty 5

## 2017-12-22 MED ORDER — ONDANSETRON HCL 4 MG/2ML IJ SOLN: 4.0000 mg | Freq: Once | INTRAMUSCULAR | Status: DC | PRN

## 2017-12-22 MED ORDER — DEXAMETHASONE SODIUM PHOSPHATE 10 MG/ML IJ SOLN
INTRAMUSCULAR | Status: DC | PRN
  Administered 2017-12-22: 10 mg via INTRAVENOUS

## 2017-12-22 MED ORDER — ENOXAPARIN SODIUM 40 MG/0.4ML ~~LOC~~ SOLN
40.0000 mg | SUBCUTANEOUS | Status: DC
  Administered 2017-12-23 – 2017-12-28 (×6): 40 mg via SUBCUTANEOUS
  Filled 2017-12-22 (×6): qty 0.4

## 2017-12-22 MED ORDER — PROPOFOL 10 MG/ML IV BOLUS
INTRAVENOUS | Status: DC | PRN
  Administered 2017-12-22: 100 mg via INTRAVENOUS

## 2017-12-22 MED ORDER — CEFAZOLIN SODIUM-DEXTROSE 2-4 GM/100ML-% IV SOLN
2.0000 g | INTRAVENOUS | Status: AC
  Administered 2017-12-22: 2 g via INTRAVENOUS

## 2017-12-22 MED ORDER — DOCUSATE SODIUM 100 MG PO CAPS
100.0000 mg | ORAL_CAPSULE | Freq: Two times a day (BID) | ORAL | Status: DC
  Administered 2017-12-22 – 2017-12-28 (×12): 100 mg via ORAL
  Filled 2017-12-22 (×12): qty 1

## 2017-12-22 MED ORDER — ONDANSETRON HCL 4 MG/2ML IJ SOLN
INTRAMUSCULAR | Status: AC
  Filled 2017-12-22: qty 2

## 2017-12-22 MED ORDER — PROPOFOL 10 MG/ML IV BOLUS
INTRAVENOUS | Status: AC
  Filled 2017-12-22: qty 20

## 2017-12-22 MED ORDER — SODIUM CHLORIDE 0.9 % IV SOLN
INTRAVENOUS | Status: DC
  Administered 2017-12-22: 20:00:00 via INTRAVENOUS

## 2017-12-22 MED ORDER — HYDROCODONE-ACETAMINOPHEN 7.5-325 MG PO TABS: 1.0000 | ORAL_TABLET | ORAL | Status: DC | PRN

## 2017-12-22 MED ORDER — TRANEXAMIC ACID 1000 MG/10ML IV SOLN
2000.0000 mg | INTRAVENOUS | Status: AC
  Administered 2017-12-22: 2000 mg via TOPICAL
  Filled 2017-12-22: qty 20

## 2017-12-22 MED ORDER — FENTANYL CITRATE (PF) 100 MCG/2ML IJ SOLN
INTRAMUSCULAR | Status: DC | PRN
  Administered 2017-12-22 (×2): 50 ug via INTRAVENOUS
  Administered 2017-12-22 (×2): 25 ug via INTRAVENOUS
  Administered 2017-12-22 (×2): 50 ug via INTRAVENOUS

## 2017-12-22 MED ORDER — METHOCARBAMOL 500 MG PO TABS
500.0000 mg | ORAL_TABLET | Freq: Four times a day (QID) | ORAL | Status: DC | PRN
  Administered 2017-12-23 – 2017-12-28 (×3): 500 mg via ORAL
  Filled 2017-12-22 (×3): qty 1

## 2017-12-22 MED ORDER — TRANEXAMIC ACID 1000 MG/10ML IV SOLN
1000.0000 mg | INTRAVENOUS | Status: AC
  Administered 2017-12-22: 1000 mg via INTRAVENOUS
  Filled 2017-12-22: qty 1000

## 2017-12-22 SURGICAL SUPPLY — 58 items
BAG DECANTER FOR FLEXI CONT (MISCELLANEOUS) ×3 IMPLANT
CELLS DAT CNTRL 66122 CELL SVR (MISCELLANEOUS) IMPLANT
COVER SURGICAL LIGHT HANDLE (MISCELLANEOUS) ×3 IMPLANT
DRAPE C-ARM 42X72 X-RAY (DRAPES) ×3 IMPLANT
DRAPE POUCH INSTRU U-SHP 10X18 (DRAPES) ×3 IMPLANT
DRAPE STERI IOBAN 125X83 (DRAPES) ×3 IMPLANT
DRAPE U-SHAPE 47X51 STRL (DRAPES) ×6 IMPLANT
DRSG AQUACEL AG ADV 3.5X10 (GAUZE/BANDAGES/DRESSINGS) ×3 IMPLANT
DRSG MEPILEX BORDER 4X8 (GAUZE/BANDAGES/DRESSINGS) ×2 IMPLANT
DURAPREP 26ML APPLICATOR (WOUND CARE) ×3 IMPLANT
ELECT BLADE 4.0 EZ CLEAN MEGAD (MISCELLANEOUS) ×3
ELECT REM PT RETURN 9FT ADLT (ELECTROSURGICAL) ×3
ELECTRODE BLDE 4.0 EZ CLN MEGD (MISCELLANEOUS) ×1 IMPLANT
ELECTRODE REM PT RTRN 9FT ADLT (ELECTROSURGICAL) ×1 IMPLANT
GLOVE BIOGEL PI IND STRL 7.0 (GLOVE) ×1 IMPLANT
GLOVE BIOGEL PI INDICATOR 7.0 (GLOVE) ×2
GLOVE ECLIPSE 7.0 STRL STRAW (GLOVE) ×6 IMPLANT
GLOVE SKINSENSE NS SZ7.5 (GLOVE) ×2
GLOVE SKINSENSE STRL SZ7.5 (GLOVE) ×1 IMPLANT
GLOVE SURG SYN 7.5  E (GLOVE) ×8
GLOVE SURG SYN 7.5 E (GLOVE) ×4 IMPLANT
GLOVE SURG SYN 7.5 PF PI (GLOVE) ×4 IMPLANT
GOWN SRG XL XLNG 56XLVL 4 (GOWN DISPOSABLE) ×1 IMPLANT
GOWN STRL NON-REIN XL XLG LVL4 (GOWN DISPOSABLE) ×2
GOWN STRL REUS W/ TWL LRG LVL3 (GOWN DISPOSABLE) IMPLANT
GOWN STRL REUS W/ TWL XL LVL3 (GOWN DISPOSABLE) ×1 IMPLANT
GOWN STRL REUS W/TWL LRG LVL3 (GOWN DISPOSABLE)
GOWN STRL REUS W/TWL XL LVL3 (GOWN DISPOSABLE) ×2
HANDPIECE INTERPULSE COAX TIP (DISPOSABLE) ×2
HEAD M SROM 36MM PLUS 1.5 (Hips) IMPLANT
HOOD PEEL AWAY FLYTE STAYCOOL (MISCELLANEOUS) ×6 IMPLANT
IV NS IRRIG 3000ML ARTHROMATIC (IV SOLUTION) ×3 IMPLANT
KIT BASIN OR (CUSTOM PROCEDURE TRAY) ×3 IMPLANT
LINER NEUTRAL 52X36MM PLUS 4 (Liner) ×2 IMPLANT
MARKER SKIN DUAL TIP RULER LAB (MISCELLANEOUS) ×3 IMPLANT
NDL SPNL 18GX3.5 QUINCKE PK (NEEDLE) ×1 IMPLANT
NEEDLE SPNL 18GX3.5 QUINCKE PK (NEEDLE) ×3 IMPLANT
PACK TOTAL JOINT (CUSTOM PROCEDURE TRAY) ×3 IMPLANT
PACK UNIVERSAL I (CUSTOM PROCEDURE TRAY) ×3 IMPLANT
PIN SECTOR W/GRIP ACE CUP 52MM (Hips) ×2 IMPLANT
RETRACTOR WND ALEXIS 18 MED (MISCELLANEOUS) IMPLANT
RTRCTR WOUND ALEXIS 18CM MED (MISCELLANEOUS)
SAW OSC TIP CART 19.5X105X1.3 (SAW) ×3 IMPLANT
SCREW 6.5MMX25MM (Screw) ×4 IMPLANT
SET HNDPC FAN SPRY TIP SCT (DISPOSABLE) ×1 IMPLANT
SROM M HEAD 36MM PLUS 1.5 (Hips) ×3 IMPLANT
STAPLER VISISTAT 35W (STAPLE) IMPLANT
STEM CORAIL KA12 (Stem) ×2 IMPLANT
SUT ETHIBOND 2 V 37 (SUTURE) ×3 IMPLANT
SUT ETHILON 2 0 FS 18 (SUTURE) ×8 IMPLANT
SUT VIC AB 1 CT1 27 (SUTURE) ×2
SUT VIC AB 1 CT1 27XBRD ANBCTR (SUTURE) ×1 IMPLANT
SUT VIC AB 2-0 CT1 27 (SUTURE) ×4
SUT VIC AB 2-0 CT1 TAPERPNT 27 (SUTURE) ×2 IMPLANT
SYRINGE 60CC LL (MISCELLANEOUS) ×3 IMPLANT
TOWEL OR 17X26 10 PK STRL BLUE (TOWEL DISPOSABLE) ×3 IMPLANT
TRAY CATH 16FR W/PLASTIC CATH (SET/KITS/TRAYS/PACK) IMPLANT
YANKAUER SUCT BULB TIP NO VENT (SUCTIONS) ×3 IMPLANT

## 2017-12-22 NOTE — Progress Notes (Signed)
Initial Nutrition Assessment  DOCUMENTATION CODES:   Non-severe (moderate) malnutrition in context of acute illness/injury  INTERVENTION:  ONS upon diet advancement. Pt reports liking Chocolate flavor at home. Monitor pt is receiving soft foods for optimal PO intake d/t not having dentures at current time.    NUTRITION DIAGNOSIS:   Moderate Malnutrition related to acute illness, decreased appetite(2 falls resulting in fractures in 3 weeks) as evidenced by energy intake < 75% for > 7 days, mild fat depletion, mild muscle depletion.   GOAL:   Patient will meet greater than or equal to 90% of their needs   MONITOR:   Diet advancement, PO intake, Supplement acceptance  REASON FOR ASSESSMENT:   Consult Hip fracture protocol  ASSESSMENT:   Pt admitted w/ left femoral neck fracture s/p fall on 9/19. Previous fall 3 weeks ago causing left proximal humerus fracture. Scheduled for surgery at 1:30 today.    Son at bedside at visit and reports living with pt. Son stated that pt takes vitD, vitB, and emergen-C every day. Pt reports very dry mouth, feeling week, having mild pain and anxious about surgery. Son stated she has history of anxiety and has not had medication this morning. PTA, son stated pt was very active before previous fall 3 weeks ago.   Pt has no dentes and lost dentures over 2 years ago and diet consists of softer foods. Pt reports no trouble chewing or swallowing. Son stated that her appetite was good, but has declined after her fall a few weeks ago and attributes loss in appetite to pain medications for the broken arm.   Prior to the first fall, pt reports typical breakfast consisting of a piece of toast w/ butter, boiled egg, and cup of coffee. 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 Son reported dinner consisting of a meat w/ potatoes, or mac/chz, and a  vegetable and stated pt would eat about 75-80% of meals. Son stated that pt does drink 1 Boost/day and enjoys the chocolate.   PMH: Sjogens disease, mild dementia  Medications reviewed and include: Remeron, Aricept, Namenda,  Morphine.   Labs reviewed: Glucose 107 (H), BUN 7 (L), Ca 8.6 (L)  NUTRITION - FOCUSED PHYSICAL EXAM:    Most Recent Value  Orbital Region  No depletion  Upper Arm Region  Mild depletion  Thoracic and Lumbar Region  Mild depletion  Buccal Region  Mild depletion  Temple Region  Mild depletion  Clavicle Bone Region  Moderate depletion  Clavicle and Acromion Bone Region  Mild depletion  Scapular Bone Region  Unable to assess  Dorsal Hand  Mild depletion  Patellar Region  Mild depletion  Anterior Thigh Region  No depletion  Posterior Calf Region  No depletion  Edema (RD Assessment)  Mild [non-pitting to BLE]  Hair  Reviewed  Eyes  Reviewed  Mouth  Reviewed [dry]  Skin  Reviewed  Nails  Reviewed       Diet Order:   Diet Order            Diet NPO time specified Except for: Sips with Meds  Diet effective midnight        Diet NPO time specified Except for: Sips with Meds, Ice Chips  Diet effective now              EDUCATION NEEDS:   Not appropriate for education at this  time  Skin:  Skin Assessment: Reviewed RN Assessment(eczema, dry)  Last BM:  (9/19 per RN note)  Height:   Ht Readings from Last 1 Encounters:  12/22/17 5' 5.98" (1.676 m)    Weight:   Wt Readings from Last 1 Encounters:  12/22/17 81.6 kg    Ideal Body Weight:  59 kg  BMI:  Body mass index is 29.05 kg/m.  Estimated Nutritional Needs:   Kcal:  1650-1800 (20-22kcal/kg)  Protein:  90-110grams (1.1-1.3g/kg)  Fluid:  1.8L    Lajuan Lines, RD, LDN  After Hours/Weekend Pager: [phone]

## 2017-12-22 NOTE — Consult Note (Signed)
ORTHOPAEDIC CONSULTATION  REQUESTING PHYSICIAN: Maretta Bees, MD  Chief Complaint: Left femoral neck fracture  HPI: Connie Snyder is a 84 y.o. female who presents with left femoral neck fracture s/p mechanical fall yesterday.  She has mild dementia on namenda and aricept.  She is very active and play ball with her grandson.  She sustained a left proximal humerus fracture 3 weeks ago which is being followed by Dr. Magnus Ivan.   Her son [name] is HCPOA.  Ortho consulted for acute left femoral neck fracture.  Pain is severe and worse with movement; no radiation of pain.  Past Medical History:  Diagnosis Date  . Arthritis   . Sjogren's disease (HCC)    History reviewed. No pertinent surgical history. Social History   Socioeconomic History  . Marital status: Widowed    Spouse name: Not on file  . Number of children: Not on file  . Years of education: Not on file  . Highest education level: Not on file  Occupational History  . Not on file  Social Needs  . Financial resource strain: Not on file  . Food insecurity:    Worry: Not on file    Inability: Not on file  . Transportation needs:    Medical: Not on file    Non-medical: Not on file  Tobacco Use  . Smoking status: Never Smoker  . Smokeless tobacco: Never Used  Substance and Sexual Activity  . Alcohol use: Not on file  . Drug use: Not on file  . Sexual activity: Not on file  Lifestyle  . Physical activity:    Days per week: Not on file    Minutes per session: Not on file  . Stress: Not on file  Relationships  . Social connections:    Talks on phone: Not on file    Gets together: Not on file    Attends religious service: Not on file    Active member of club or organization: Not on file    Attends meetings of clubs or organizations: Not on file    Relationship status: Not on file  Other Topics Concern  . Not on file  Social History Narrative  . Not on file   Family History  Family history unknown: Yes    - negative except otherwise stated in the family history section No Known Allergies Prior to Admission medications   Medication Sig Start Date End Date Taking? Authorizing Provider  donepezil (ARICEPT) 5 MG tablet  11/27/17   [provider]  memantine (NAMENDA) 5 MG tablet Take 5 mg by mouth daily. 11/20/17   [provider]  methotrexate (RHEUMATREX) 2.5 MG tablet Take 15 mg by mouth once a week. 11/13/17   [provider]  mirtazapine (REMERON) 15 MG tablet Take 15 mg by mouth every evening. 09/02/17   [provider]  mirtazapine (REMERON) 30 MG tablet TAKE 1 TABLET BEFORE BEDTIME 11/01/17   [provider]  oxyCODONE (ROXICODONE) 5 MG immediate release tablet Take 1-2 tablets (5-10 mg total) by mouth every 4 (four) hours as needed for severe pain. 12/21/17   Kathryne Hitch, MD  oxyCODONE-acetaminophen (PERCOCET/ROXICET) 5-325 MG tablet Take 1 tablet by mouth every 4 (four) hours as needed for severe pain. 11/29/17   Raeford Razor, MD  triamterene-hydrochlorothiazide (MAXZIDE-25) 37.5-25 MG tablet Take 0.5 tablets by mouth daily. 09/06/17   [provider]   Dg Chest 1 View  Result Date: 12/21/2017 CLINICAL DATA:  Patient fell today while  walking in her house. Left hip and knee pain. EXAM: CHEST  1 VIEW COMPARISON:  Left shoulder radiographs 12/07/2017 FINDINGS: Redemonstration of subacute displaced overlapping fracture of the proximal left humerus involving the surgical neck. Osteoarthritis of the [facility] and both glenohumeral joints. Cardiomegaly with aortic atherosclerosis. No acute pulmonary consolidation or pneumothorax. No effusion. No acute displaced rib fracture. IMPRESSION: Cardiomegaly with aortic atherosclerosis. No active pulmonary disease. Known subacute left surgical neck fracture of the humerus with displaced humeral diaphysis as before. Electronically Signed   By: Tollie Eth M.D.   On: 12/21/2017 19:48   Dg Hip Unilat With  Pelvis 2-3 Views Left  Result Date: 12/21/2017 CLINICAL DATA:  Fall.  Left hip pain. EXAM: DG HIP (WITH OR WITHOUT PELVIS) 2-3V LEFT COMPARISON:  None. FINDINGS: Bones are diffusely demineralized. Frontal pelvis shows unremarkable SI joints and symphysis pubis. Left femoral neck fracture in subcapsular to transcervical location shows varus angulation. No worrisome lytic or sclerotic osseous abnormality. IMPRESSION: Left femoral neck fracture with varus angulation. Electronically Signed   By: Kennith Center M.D.   On: 12/21/2017 19:47   Dg Femur Min 2 Views Left  Result Date: 12/21/2017 CLINICAL DATA:  Fall.  Left hip and knee pain. EXAM: LEFT FEMUR 2 VIEWS COMPARISON:  None. FINDINGS: Two views study reviewed along with the left hip exam. Two-view exam of the left femur shows a femoral neck fracture with varus angulation. No worrisome lytic or sclerotic osseous abnormality. Advanced degenerative changes are evident at the knee with probable intra-articular loose body. IMPRESSION: 1. Femoral neck fracture with varus angulation. 2. Marked degenerative changes in the knee with intra-articular loose body. Electronically Signed   By: Kennith Center M.D.   On: 12/21/2017 19:45   Xr Humerus Left  Result Date: 12/21/2017 2 views of the left proximal humerus show a 2 part proximal humerus fracture with slight interval healing.  - pertinent xrays, CT, MRI studies were reviewed and independently interpreted  Positive ROS: All other systems have been reviewed and were otherwise negative with the exception of those mentioned in the HPI and as above.  Physical Exam: General: Alert, no acute distress Cardiovascular: No pedal edema Respiratory: No cyanosis, no use of accessory musculature GI: No organomegaly, abdomen is soft and non-tender Skin: No lesions in the area of chief complaint Neurologic: Sensation intact distally Psychiatric: Patient has mild dementia.  Oriented to place, time, and self Lymphatic:  No axillary or cervical lymphadenopathy  MUSCULOSKELETAL:  LLE exam shows pain with movement, NVI  Assessment: Left femoral neck fracture  Plan: - patient is very active at her age at baseline - discussed with son that recommendation is for THA which will give pain relief and ability to rehab from injury - r/b/a and rehab and recovery reviewed with son who agrees to proceed - continue NPO - surgery scheduled for 1:30 pm today  Thank you for the consult and the opportunity to see Ms. Hillery Jacks. Glee Arvin, MD [facility] [phone] 7:30 AM

## 2017-12-22 NOTE — H&P (Addendum)
History and Physical    Connie Snyder GNO:[id number] DOB: 04-12-1927 DOA: 12/21/2017  PCP: Patient, No Pcp Per  Patient coming from: Home.  History obtained from ER physician and patient's nurse.  Chief Complaint: Fall.  HPI: Connie Snyder is a 84 y.o. female with history of dementia, Sjogren's disease arthritis on methotrexate, anemia had a fall at home while after returning from a orthopedic office visit.  Was witnessed by patient's son.  Patient slid onto the floor.  Did not lose consciousness.  Patient had a fall 3 weeks ago and had sustained left humerus fracture and followed up with Dr. Magnus Ivan orthopedic surgeon yesterday.  Patient did not complain of any chest pain headache or any visual symptoms.  Was having left hip pain.  ED Course: In the ER x-rays revealed left hip fracture.  Orthopedic surgeon Dr. Magnus Ivan was notified.  On my exam patient is not in distress.  Review of Systems: As per HPI, rest all negative.   Past Medical History:  Diagnosis Date  . Arthritis   . Sjogren's disease (HCC)     History reviewed. No pertinent surgical history.   reports that she has never smoked. She has never used smokeless tobacco. Her alcohol and drug histories are not on file.  No Known Allergies  Family History  Family history unknown: Yes    Prior to Admission medications   Medication Sig Start Date End Date Taking? Authorizing Provider  donepezil (ARICEPT) 5 MG tablet  11/27/17   [provider]  memantine (NAMENDA) 5 MG tablet Take 5 mg by mouth daily. 11/20/17   [provider]  methotrexate (RHEUMATREX) 2.5 MG tablet Take 15 mg by mouth once a week. 11/13/17   [provider]  mirtazapine (REMERON) 15 MG tablet Take 15 mg by mouth every evening. 09/02/17   [provider]  mirtazapine (REMERON) 30 MG tablet TAKE 1 TABLET BEFORE BEDTIME 11/01/17   [provider]  oxyCODONE (ROXICODONE) 5 MG immediate release tablet Take 1-2  tablets (5-10 mg total) by mouth every 4 (four) hours as needed for severe pain. 12/21/17   Kathryne Hitch, MD  oxyCODONE-acetaminophen (PERCOCET/ROXICET) 5-325 MG tablet Take 1 tablet by mouth every 4 (four) hours as needed for severe pain. 11/29/17   Raeford Razor, MD  triamterene-hydrochlorothiazide (MAXZIDE-25) 37.5-25 MG tablet Take 0.5 tablets by mouth daily. 09/06/17   [provider]    Physical Exam: Vitals:   12/21/17 1800 12/21/17 2053 12/21/17 2158 12/21/17 2259  BP: (!) 160/71 (!) 146/68 (!) 146/98 (!) 158/76  Pulse: 87 62 74 79  Resp: 20 18 18 18   Temp: 100.1 F (37.8 C)   100.2 F (37.9 C)  TempSrc: Oral   Oral  SpO2: 100% 100% 100% 100%  Weight:      Height:          Constitutional: Moderately built and nourished. Vitals:   12/21/17 1800 12/21/17 2053 12/21/17 2158 12/21/17 2259  BP: (!) 160/71 (!) 146/68 (!) 146/98 (!) 158/76  Pulse: 87 62 74 79  Resp: 20 18 18 18   Temp: 100.1 F (37.8 C)   100.2 F (37.9 C)  TempSrc: Oral   Oral  SpO2: 100% 100% 100% 100%  Weight:      Height:       Eyes: Anicteric no pallor. ENMT: No discharge from the ears eyes nose or mouth. Neck: No mass or.  No neck rigidity. Respiratory: No rhonchi or crepitations. Cardiovascular: S1-S2 heard no murmurs  appreciated. Abdomen: Soft nontender bowel sounds present. Musculoskeletal: Pain on moving left hip.  And also left shoulder. Skin: No rash. Neurologic: Alert awake oriented to name and place.  Pain on moving both left upper and lower extremities. Psychiatric: Has dementia.   Labs on Admission: I have personally reviewed following labs and imaging studies  CBC: Recent Labs  Lab 12/21/17 1838  WBC 7.8  NEUTROABS 6.2  HGB 10.7*  HCT 30.1*  MCV 82.7  PLT 416*   Basic Metabolic Panel: Recent Labs  Lab 12/21/17 1838  NA 141  K 4.0  CL 104  CO2 28  GLUCOSE 97  BUN 11  CREATININE 0.48  CALCIUM 8.8*   GFR: Estimated Creatinine Clearance: 53.3  mL/min (by C-G formula based on SCr of 0.48 mg/dL). Liver Function Tests: No results for input(s): AST, ALT, ALKPHOS, BILITOT, PROT, ALBUMIN in the last 168 hours. No results for input(s): LIPASE, AMYLASE in the last 168 hours. No results for input(s): AMMONIA in the last 168 hours. Coagulation Profile: No results for input(s): INR, PROTIME in the last 168 hours. Cardiac Enzymes: No results for input(s): CKTOTAL, CKMB, CKMBINDEX, TROPONINI in the last 168 hours. BNP (last 3 results) No results for input(s): PROBNP in the last 8760 hours. HbA1C: No results for input(s): HGBA1C in the last 72 hours. CBG: No results for input(s): GLUCAP in the last 168 hours. Lipid Profile: No results for input(s): CHOL, HDL, LDLCALC, TRIG, CHOLHDL, LDLDIRECT in the last 72 hours. Thyroid Function Tests: No results for input(s): TSH, T4TOTAL, FREET4, T3FREE, THYROIDAB in the last 72 hours. Anemia Panel: No results for input(s): VITAMINB12, FOLATE, FERRITIN, TIBC, IRON, RETICCTPCT in the last 72 hours. Urine analysis: No results found for: COLORURINE, APPEARANCEUR, LABSPEC, PHURINE, GLUCOSEU, HGBUR, BILIRUBINUR, KETONESUR, PROTEINUR, UROBILINOGEN, NITRITE, LEUKOCYTESUR Sepsis Labs: @LABRCNTIP (procalcitonin:4,lacticidven:4) )No results found for this or any previous visit (from the past 240 hour(s)).   Radiological Exams on Admission: Dg Chest 1 View  Result Date: 12/21/2017 CLINICAL DATA:  Patient fell today while walking in her house. Left hip and knee pain. EXAM: CHEST  1 VIEW COMPARISON:  Left shoulder radiographs 12/07/2017 FINDINGS: Redemonstration of subacute displaced overlapping fracture of the proximal left humerus involving the surgical neck. Osteoarthritis of the [facility] and both glenohumeral joints. Cardiomegaly with aortic atherosclerosis. No acute pulmonary consolidation or pneumothorax. No effusion. No acute displaced rib fracture. IMPRESSION: Cardiomegaly with aortic atherosclerosis. No active  pulmonary disease. Known subacute left surgical neck fracture of the humerus with displaced humeral diaphysis as before. Electronically Signed   By: [facility] M.D.   On: 12/21/2017 19:48   Dg Hip Unilat With Pelvis 2-3 Views Left  Result Date: 12/21/2017 CLINICAL DATA:  Fall.  Left hip pain. EXAM: DG HIP (WITH OR WITHOUT PELVIS) 2-3V LEFT COMPARISON:  None. FINDINGS: Bones are diffusely demineralized. Frontal pelvis shows unremarkable SI joints and symphysis pubis. Left femoral neck fracture in subcapsular to transcervical location shows varus angulation. No worrisome lytic or sclerotic osseous abnormality. IMPRESSION: Left femoral neck fracture with varus angulation. Electronically Signed   By: 04-30-1968 M.D.   On: 12/21/2017 19:47   Dg Femur Min 2 Views Left  Result Date: 12/21/2017 CLINICAL DATA:  Fall.  Left hip and knee pain. EXAM: LEFT FEMUR 2 VIEWS COMPARISON:  None. FINDINGS: Two views study reviewed along with the left hip exam. Two-view exam of the left femur shows a femoral neck fracture with varus angulation. No worrisome lytic or sclerotic osseous abnormality. Advanced degenerative changes are  evident at the knee with probable intra-articular loose body. IMPRESSION: 1. Femoral neck fracture with varus angulation. 2. Marked degenerative changes in the knee with intra-articular loose body. Electronically Signed   By: Kennith Center M.D.   On: 12/21/2017 19:45   Xr Humerus Left  Result Date: 12/21/2017 2 views of the left proximal humerus show a 2 part proximal humerus fracture with slight interval healing.   EKG: Independently reviewed.  Normal sinus rhythm with LBBB.  Assessment/Plan Principal Problem:   Closed fracture of left hip (HCC) Active Problems:   Closed fracture of left proximal humerus   Normocytic normochromic anemia   S/p left hip fracture    1. Left hip fracture status post mechanical fall and also has a left humerus fracture -orthopedic surgeon has been  notified.  Patient will be kept n.p.o. in anticipation of possible procedure and pain relief medications.  Patient will be at moderate risk for intermediate risk procedure. 2. History of dementia on Namenda and Aricept. 3. Will be seen in EKG no old EKG to compare denies any chest pain. 4. Mild fever we will check UA chest x-ray unremarkable. 5. Chronic anemia follow CBC. 6. History of arthritis on methotrexate and takes weekly.   DVT prophylaxis: SCDs. Code Status: Full code.  This was confirmed by the patient's son. Family Communication: ER physician and the patient nurse and discussed with patient's son. Disposition Plan: Probably rehab.  On Consults called: Orthopedic surgery. Admission status: Inpatient.   Eduard Clos MD [facility] Pager [phone].  If 7PM-7AM, please contact night-coverage [email or web address] Password [facility]  12/22/2017, 12:35 AM

## 2017-12-22 NOTE — Progress Notes (Signed)
PROGRESS NOTE        PATIENT DETAILS Name: Connie Snyder Age: 84 y.o. Sex: female Date of Birth: 10/19/1927 Admit Date: 12/21/2017 Admitting Physician Ejiroghene Wendall Stade, MD BSW:[id number], No Pcp Per  Brief Narrative: Patient is a 84 y.o. female with prior history of dementia, Sjogren's disease/arthritis on methotrexate admitted following a mechanical fall found to have a left hip fracture.  See below for further details.  Subjective: Lying comfortably in bed-answers most of my questions appropriately-does appear intermittently confused at times.  Denies any chest pain or shortness of breath.  Claims that she did not lose consciousness when she fell and broke her hip-she claims she try to reach over her grandson and tripped.  Assessment/Plan: Left hip fracture: Following a mechanical fall-Ortho following with plans for surgical repair later today.  Dementia: Suspect she is very close to usual baseline-mostly awake and alert-intermittently confused-continue Namenda and Aricept.  Left bundle branch block: Probably chronic-no prior EKGs to compare.  She is highly active-apparently plays ball with her grandson, and is easily able to climb up a few flight of stairs.  She has no prior history of CAD/CHF/CVA.  Hypertension: Blood pressure currently controlled-all antihypertensives on hold-resume when able.  Normocytic anemia: Appears to be chronic-we will follow-no evidence of GI blood loss at this point-slight drop can be due to hip fracture itself.  Sjogren's disease/arthritis: Hold methotrexate for now.  DVT Prophylaxis: SCD's-postoperative DVT prophylaxis deferred to the orthopedic service.  Code Status: Full code  Family Communication: None at bedside  Disposition Plan: Remain inpatient-probably SNF on discharge sometime early next week.  Antimicrobial agents: Anti-infectives (From admission, onward)   None     Procedures: None  CONSULTS:  orthopedic surgery  Time spent: 25- minutes-Greater than 50% of this time was spent in counseling, explanation of diagnosis, planning of further management, and coordination of care.  MEDICATIONS: Scheduled Meds: . Chlorhexidine Gluconate Cloth  6 each Topical Daily  . donepezil  5 mg Oral QHS  . memantine  5 mg Oral Daily  . mirtazapine  30 mg Oral QHS  . mupirocin ointment  1 application Nasal BID  . povidone-iodine  2 application Topical Once   Continuous Infusions: . tranexamic acid     PRN Meds:.morphine injection, senna-docusate   PHYSICAL EXAM: Vital signs: Vitals:   12/21/17 2053 12/21/17 2158 12/21/17 2259 12/22/17 0415  BP: (!) 146/68 (!) 146/98 (!) 158/76 132/83  Pulse: 62 74 79 69  Resp: 18 18 18 17   Temp:   100.2 F (37.9 C) 99.1 F (37.3 C)  TempSrc:   Oral Oral  SpO2: 100% 100% 100% 98%  Weight:    81.6 kg  Height:    5' 5.98" (1.676 m)   Filed Weights   12/21/17 1757 12/22/17 0415  Weight: 81.6 kg 81.6 kg   Body mass index is 29.05 kg/m.   General appearance :Awake, alert, not in any distress.  Eyes:Pink conjunctiva HEENT: Atraumatic and Normocephalic Neck: supple Resp:Good air entry bilaterally, no added sounds  CVS: S1 S2 regular, no murmurs.  GI: Bowel sounds present, Non tender and not distended with no gaurding, rigidity or rebound.No organomegaly Extremities: B/L Lower Ext shows no edema, both legs are warm to touch Neurology:  speech clear,Non focal, sensation is grossly intact. Musculoskeletal:No digital cyanosis Skin:No Rash, warm and dry Wounds:N/A  I have personally  reviewed following labs and imaging studies  LABORATORY DATA: CBC: Recent Labs  Lab 12/21/17 1838 12/22/17 0335  WBC 7.8 10.9*  NEUTROABS 6.2  --   HGB 10.7* 10.1*  HCT 30.1* 28.5*  MCV 82.7 83.8  PLT 416* 345    Basic Metabolic Panel: Recent Labs  Lab 12/21/17 1838 12/22/17 0335  NA 141 138  K 4.0 4.1  CL 104 105  CO2 28 26  GLUCOSE 97 107*  BUN  11 7*  CREATININE 0.48 0.52  CALCIUM 8.8* 8.6*    GFR: Estimated Creatinine Clearance: 53.3 mL/min (by C-G formula based on SCr of 0.52 mg/dL).  Liver Function Tests: No results for input(s): AST, ALT, ALKPHOS, BILITOT, PROT, ALBUMIN in the last 168 hours. No results for input(s): LIPASE, AMYLASE in the last 168 hours. No results for input(s): AMMONIA in the last 168 hours.  Coagulation Profile: No results for input(s): INR, PROTIME in the last 168 hours.  Cardiac Enzymes: Recent Labs  Lab 12/22/17 0639  TROPONINI <0.03    BNP (last 3 results) No results for input(s): PROBNP in the last 8760 hours.  HbA1C: No results for input(s): HGBA1C in the last 72 hours.  CBG: No results for input(s): GLUCAP in the last 168 hours.  Lipid Profile: No results for input(s): CHOL, HDL, LDLCALC, TRIG, CHOLHDL, LDLDIRECT in the last 72 hours.  Thyroid Function Tests: No results for input(s): TSH, T4TOTAL, FREET4, T3FREE, THYROIDAB in the last 72 hours.  Anemia Panel: No results for input(s): VITAMINB12, FOLATE, FERRITIN, TIBC, IRON, RETICCTPCT in the last 72 hours.  Urine analysis:    Component Value Date/Time   COLORURINE YELLOW 12/22/2017 0742   APPEARANCEUR CLOUDY (A) 12/22/2017 0742   LABSPEC 1.011 12/22/2017 0742   PHURINE 9.0 (H) 12/22/2017 0742   GLUCOSEU NEGATIVE 12/22/2017 0742   HGBUR NEGATIVE 12/22/2017 0742   BILIRUBINUR NEGATIVE 12/22/2017 0742   KETONESUR NEGATIVE 12/22/2017 0742   PROTEINUR NEGATIVE 12/22/2017 0742   NITRITE NEGATIVE 12/22/2017 0742   LEUKOCYTESUR NEGATIVE 12/22/2017 0742    Sepsis Labs: Lactic Acid, Venous No results found for: LATICACIDVEN  MICROBIOLOGY: Recent Results (from the past 240 hour(s))  Surgical pcr screen     Status: Abnormal   Collection Time: 12/21/17 10:48 PM  Result Value Ref Range Status   MRSA, PCR NEGATIVE NEGATIVE Final   Staphylococcus aureus POSITIVE (A) NEGATIVE Final    Comment: (NOTE) The Xpert SA Assay (FDA  approved for NASAL specimens in patients 37 years of age and older), is one component of a comprehensive surveillance program. It is not intended to diagnose infection nor to guide or monitor treatment. Performed at [facility], [address], [place], [place] [address]     RADIOLOGY STUDIES/RESULTS: Dg Chest 1 View  Result Date: 12/21/2017 CLINICAL DATA:  Patient fell today while walking in her house. Left hip and knee pain. EXAM: CHEST  1 VIEW COMPARISON:  Left shoulder radiographs 12/07/2017 FINDINGS: Redemonstration of subacute displaced overlapping fracture of the proximal left humerus involving the surgical neck. Osteoarthritis of the [facility] and both glenohumeral joints. Cardiomegaly with aortic atherosclerosis. No acute pulmonary consolidation or pneumothorax. No effusion. No acute displaced rib fracture. IMPRESSION: Cardiomegaly with aortic atherosclerosis. No active pulmonary disease. Known subacute left surgical neck fracture of the humerus with displaced humeral diaphysis as before. Electronically Signed   By: Tollie Eth M.D.   On: 12/21/2017 19:48   Dg Shoulder Left  Result Date: 11/29/2017 CLINICAL DATA:  84 year old who tripped and  fell on her porch this evening and injured the LEFT shoulder. Initial encounter. EXAM: LEFT SHOULDER - 2+ VIEW COMPARISON:  LEFT shoulder x-rays and MRI 04/12/2006. FINDINGS: Acute comminuted fracture involving the proximal LEFT humeral metaphysis. Avulsion fracture arising from the humeral head. Narrowed subacromial space. Degenerative changes involving the acromioclavicular joint. Glenohumeral joint anatomically aligned with narrowing of the joint space. Generalized osseous demineralization. IMPRESSION: 1. Acute comminuted fracture involving the proximal LEFT humeral metaphysis. 2. Avulsion fracture arising from the RIGHT head. 3. Narrowed subacromial space indicating chronic supraspinatus tendon disease. 4. Osseous demineralization. 5. Osteoarthritis  involving the glenohumeral joint and the AC joint. Electronically Signed   By: Hulan Saas M.D.   On: 11/29/2017 21:03   Dg Shoulder Left Port  Result Date: 12/22/2017 CLINICAL DATA:  Left shoulder fracture.  Follow-up. EXAM: LEFT SHOULDER - 1 VIEW COMPARISON:  11/29/2017 FINDINGS: Left humeral neck fracture again noted. There is anterior displacement on the Y-view with significant angulation. This has changed since prior study. No subluxation or dislocation. IMPRESSION: Increasing anterior displacement and angulation at the left humeral neck fracture. Electronically Signed   By: Charlett Nose M.D.   On: 12/22/2017 09:19   Dg Hip Unilat With Pelvis 2-3 Views Left  Result Date: 12/21/2017 CLINICAL DATA:  Fall.  Left hip pain. EXAM: DG HIP (WITH OR WITHOUT PELVIS) 2-3V LEFT COMPARISON:  None. FINDINGS: Bones are diffusely demineralized. Frontal pelvis shows unremarkable SI joints and symphysis pubis. Left femoral neck fracture in subcapsular to transcervical location shows varus angulation. No worrisome lytic or sclerotic osseous abnormality. IMPRESSION: Left femoral neck fracture with varus angulation. Electronically Signed   By: Kennith Center M.D.   On: 12/21/2017 19:47   Dg Femur Min 2 Views Left  Result Date: 12/21/2017 CLINICAL DATA:  Fall.  Left hip and knee pain. EXAM: LEFT FEMUR 2 VIEWS COMPARISON:  None. FINDINGS: Two views study reviewed along with the left hip exam. Two-view exam of the left femur shows a femoral neck fracture with varus angulation. No worrisome lytic or sclerotic osseous abnormality. Advanced degenerative changes are evident at the knee with probable intra-articular loose body. IMPRESSION: 1. Femoral neck fracture with varus angulation. 2. Marked degenerative changes in the knee with intra-articular loose body. Electronically Signed   By: Kennith Center M.D.   On: 12/21/2017 19:45   Xr Humerus Left  Result Date: 12/21/2017 2 views of the left proximal humerus show a 2  part proximal humerus fracture with slight interval healing.  Xr Shoulder 1v Left  Result Date: 12/07/2017 Single view of the left shoulder shows a 2 part proximal humerus fracture that is displaced.    LOS: 1 day   Jeoffrey Massed, MD  [facility]  If 7PM-7AM, please contact night-coverage  Please page via [email or web address]-Password [email or web address]-click on MD name and type text message  12/22/2017, 10:11 AM

## 2017-12-22 NOTE — Op Note (Signed)
TOTAL HIP ARTHROPLASTY ANTERIOR APPROACH  Procedure Note Connie Snyder   [id number]  Pre-op Diagnosis: left femoral neck fracture     Post-op Diagnosis: same   Operative Procedures  1. Total hip replacement; Left hip; uncemented [id number]   Personnel  Surgeon(s): Tarry Kos, MD  Assist: Oneal Grout, PA-C; necessary for the timely completion of procedure and due to complexity of procedure.   Anesthesia: general  Prosthesis: Depuy Acetabulum: Pinnacle 52 mm Femur: Corail KA 12 Head: 36 mm size: +1.5 Liner: +4 neutral Bearing Type: metal on poly  Total Hip Arthroplasty (Anterior Approach) Op Note:  After informed consent was obtained and the operative extremity marked in the holding area, the patient was brought back to the operating room and placed supine on the HANA table. Next, the operative extremity was prepped and draped in normal sterile fashion. Surgical timeout occurred verifying patient identification, surgical site, surgical procedure and administration of antibiotics.  A modified anterior Smith-Peterson approach to the hip was performed, using the interval between tensor fascia lata and sartorius.  Dissection was carried bluntly down onto the anterior hip capsule. The lateral femoral circumflex vessels were identified and coagulated. A capsulotomy was performed and the capsular flaps tagged for later repair.  Fluoroscopy was utilized to prepare for the femoral neck cut. The neck osteotomy was performed. The femoral head was removed, the acetabular rim was cleared of soft tissue and attention was turned to reaming the acetabulum.  Sequential reaming was performed under fluoroscopic guidance. We reamed to a size 51 mm, and then impacted the acetabular shell. The liner was then placed after irrigation and attention turned to the femur.  After placing the femoral hook, the leg was taken to externally rotated, extended and adducted position taking care to perform  soft tissue releases to allow for adequate mobilization of the femur. Soft tissue was cleared from the shoulder of the greater trochanter and the hook elevator used to improve exposure of the proximal femur. Sequential broaching performed up to a size 12. Trial neck and head were placed. The leg was brought back up to neutral and the construct reduced. The position and sizing of components, offset and leg lengths were checked using fluoroscopy. Stability of the  construct was checked in extension and external rotation without any subluxation or impingement of prosthesis. We dislocated the prosthesis, dropped the leg back into position, removed trial components, and irrigated copiously. The final stem and head was then placed, the leg brought back up, the system reduced and fluoroscopy used to verify positioning.  We irrigated, obtained hemostasis and closed the capsule using #2 ethibond suture.  One gram of vancomycin powder was placed in the surgical bed. The fascia was closed with #1 vicryl plus, the deep fat layer was closed with 0 vicryl, the subcutaneous layers closed with 2.0 Vicryl Plus and the skin closed with 2.0 nylon. A sterile dressing was applied. The patient was awakened in the operating room and taken to recovery in stable condition.  All sponge, needle, and instrument counts were correct at the end of the case.   Position: supine  Complications: none.  Time Out: performed   Drains/Packing: none  Estimated blood loss: 150 cc  Returned to Recovery Room: in good condition.   Antibiotics: yes   Mechanical VTE (DVT) Prophylaxis: sequential compression devices, TED thigh-high  Chemical VTE (DVT) Prophylaxis: lovenox   Fluid Replacement: see anesthesia record  Specimens Removed: 1 to pathology   Sponge and Instrument Count  Correct? yes   PACU: portable radiograph - low AP   Admission: inpatient status  Plan/RTC: Return in 2 weeks for staple removal. Weight Bearing/Load Lower  Extremity: full  Hip precautions: none Suture Removal: 10-14 days  Betadine to incision twice daily once dressing is removed on POD#7  N. Glee Arvin, MD [facility] [phone] 3:39 PM     Implant Name Type Inv. Item Serial No. Manufacturer Lot No. LRB No. Used  LINER NEUTRAL [id number] PLUS 4 - [id number] Liner LINER NEUTRAL 52X36MM PLUS 4  DEPUY SYNTHES [id number] Left 1  PIN SECTOR W/GRIP ACE CUP - [id number] Hips PIN SECTOR W/GRIP ACE CUP  DEPUY SYNTHES [id number] Left 1  SCREW 6.5MMX25MM - [id number] Screw SCREW 6.5MMX25MM  DEPUY SYNTHES [id number] Left 1  SCREW 6.5MMX25MM - [id number] Screw SCREW 6.5MMX25MM  DEPUY SYNTHES [id number] Left 1

## 2017-12-22 NOTE — Anesthesia Preprocedure Evaluation (Addendum)
Anesthesia Evaluation  Patient identified by MRN, date of birth, ID band Patient awake    Reviewed: Allergy & Precautions, NPO status , Patient's Chart, lab work & pertinent test results  Airway Mallampati: II  TM Distance: >3 FB Neck ROM: Full    Dental  (+) Dental Advisory Given, Edentulous Upper, Edentulous Lower   Pulmonary neg pulmonary ROS,    Pulmonary exam normal breath sounds clear to auscultation       Cardiovascular Normal cardiovascular exam+ dysrhythmias (LBBB)  Rhythm:Regular Rate:Normal     Neuro/Psych Dementia  negative psych ROS   GI/Hepatic negative GI ROS, Neg liver ROS,   Endo/Other  Sjogren's disease   Renal/GU negative Renal ROS     Musculoskeletal  (+) Arthritis , Rheumatoid disorders,  left femoral neck fracture   Abdominal   Peds  Hematology  (+) Blood dyscrasia, anemia , Plt 345k   Anesthesia Other Findings Day of surgery medications reviewed with the patient.  Reproductive/Obstetrics                          Anesthesia Physical Anesthesia Plan  ASA: III  Anesthesia Plan: General   Post-op Pain Management:    Induction: Intravenous  PONV Risk Score and Plan: 3 and Dexamethasone and Ondansetron  Airway Management Planned: Oral ETT  Additional Equipment:   Intra-op Plan:   Post-operative Plan: Extubation in OR  Informed Consent: I have reviewed the patients History and Physical, chart, labs and discussed the procedure including the risks, benefits and alternatives for the proposed anesthesia with the patient or authorized representative who has indicated his/her understanding and acceptance.   Dental advisory given  Plan Discussed with: CRNA  Anesthesia Plan Comments:        Anesthesia Quick Evaluation

## 2017-12-22 NOTE — Progress Notes (Signed)
Called patient son concerning patient home meds and code status. Son stated that he wanted CPR to be performed in the event that patient stops breathing.  Son also listed current meds that patient was taking at home. Information relayed to MD. Son also stated that he will bring patients sling from home and that patient is supposed to wear sling d/t a fractured Lt humerus.

## 2017-12-22 NOTE — Anesthesia Procedure Notes (Signed)
Procedure Name: Intubation Date/Time: 12/22/2017 2:15 PM Performed by: Scheryl Darter, CRNA Pre-anesthesia Checklist: Patient identified, Emergency Drugs available, Suction available and Patient being monitored Patient Re-evaluated:Patient Re-evaluated prior to induction Oxygen Delivery Method: Circle System Utilized Preoxygenation: Pre-oxygenation with 100% oxygen Induction Type: IV induction Ventilation: Mask ventilation without difficulty and Oral airway inserted - appropriate to patient size Laryngoscope Size: Mac and 3 Grade View: Grade I Tube type: Oral Tube size: 7.0 mm Number of attempts: 1 Airway Equipment and Method: Stylet and Oral airway Placement Confirmation: ETT inserted through vocal cords under direct vision,  positive ETCO2 and breath sounds checked- equal and bilateral Secured at: 22 cm Tube secured with: Tape Dental Injury: Teeth and Oropharynx as per pre-operative assessment

## 2017-12-22 NOTE — H&P (Signed)
H&P update  The surgical history has been reviewed and remains accurate without interval change.  The patient was re-examined and patient's physiologic condition has not changed significantly in the last 30 days. The condition still exists that makes this procedure necessary. The treatment plan remains the same, without new options for care.  No new pharmacological allergies or types of therapy has been initiated that would change the plan or the appropriateness of the plan.  The patient and/or family understand the potential benefits and risks.  Mayra Reel, MD 12/22/2017 10:06 AM

## 2017-12-22 NOTE — Transfer of Care (Signed)
Immediate Anesthesia Transfer of Care Note  Patient: Connie Snyder  Procedure(s) Performed: TOTAL HIP ARTHROPLASTY ANTERIOR APPROACH (Left Hip)  Patient Location: PACU  Anesthesia Type:General  Level of Consciousness: awake, alert  and patient cooperative  Airway & Oxygen Therapy: Patient Spontanous Breathing and Patient connected to face mask oxygen  Post-op Assessment: Report given to RN and Post -op Vital signs reviewed and stable  Post vital signs: Reviewed and stable  Last Vitals:  Vitals Value Taken Time  BP 155/129 12/22/2017  4:18 PM  Temp    Pulse 99 12/22/2017  4:18 PM  Resp 27 12/22/2017  4:18 PM  SpO2 98 % 12/22/2017  4:18 PM  Vitals shown include unvalidated device data.  Last Pain:  Vitals:   12/22/17 0738  TempSrc:   PainSc: Asleep         Complications: No apparent anesthesia complications

## 2017-12-22 NOTE — Discharge Instructions (Signed)
° ° °  1. Change dressings as needed °2. May shower but keep incisions covered and dry °3. Take lovenox to prevent blood clots °4. Take stool softeners as needed °5. Take pain meds as needed ° °

## 2017-12-23 DIAGNOSIS — F0390 Unspecified dementia without behavioral disturbance: Secondary | ICD-10-CM

## 2017-12-23 DIAGNOSIS — I10 Essential (primary) hypertension: Secondary | ICD-10-CM

## 2017-12-23 DIAGNOSIS — D649 Anemia, unspecified: Secondary | ICD-10-CM

## 2017-12-23 LAB — BASIC METABOLIC PANEL
Anion gap: 11 (ref 5–15)
BUN: 12 mg/dL (ref 8–23)
CHLORIDE: 102 mmol/L (ref 98–111)
CO2: 22 mmol/L (ref 22–32)
CREATININE: 0.61 mg/dL (ref 0.44–1.00)
Calcium: 8.3 mg/dL — ABNORMAL LOW (ref 8.9–10.3)
GFR calc non Af Amer: >60 mL/min (ref >60)
Glucose, Bld: 184 mg/dL — ABNORMAL HIGH (ref 70–99)
Potassium: 3.9 mmol/L (ref 3.5–5.1)
Sodium: 135 mmol/L (ref 135–145)

## 2017-12-23 LAB — CBC
HCT: 25.6 % — ABNORMAL LOW (ref 36.0–46.0)
HEMOGLOBIN: 8.9 g/dL — AB (ref 12.0–15.0)
MCH: 29.3 pg (ref 26.0–34.0)
MCHC: 34.8 g/dL (ref 30.0–36.0)
MCV: 84.2 fL (ref 78.0–100.0)
Platelets: 297 10*3/uL (ref 150–400)
RBC: 3.04 MIL/uL — ABNORMAL LOW (ref 3.87–5.11)
RDW: 16 % — ABNORMAL HIGH (ref 11.5–15.5)
WBC: 12.6 10*3/uL — ABNORMAL HIGH (ref 4.0–10.5)

## 2017-12-23 NOTE — Evaluation (Signed)
Physical Therapy Evaluation Patient Details Name: Connie Snyder MRN: [id number] DOB: 30-Sep-1927 Today's Date: 12/23/2017   History of Present Illness  (P)  Pt is an 84 y.o. female with prior history of dementia, Sjogren's disease/arthritis, and L proximal humeral fx sustained in a fall approx 3 weeks ago. She was admitted following a second mechanical fall sustaining a left hip fracture. She underwent L THA 12-22-17.     Clinical Impression  Pt admitted with above diagnosis. Pt currently with functional limitations due to the deficits listed below (see PT Problem List). PTA pt lived at home with son, independent with functional mobility. On eval, she required +2 mod assist bed mobility, +2 min assist sit to stand, and +2 mod assist SPT bed to recliner.  Mobility limited by pain LLE and NWB status LUE. She is very motivated and eager to participate in therapy. She has a supportive family. Pt will benefit from skilled PT to increase their independence and safety with mobility to allow discharge to the venue listed below.       Follow Up Recommendations CIR    Equipment Recommendations  Other (comment)(TBD)    Recommendations for Other Services Rehab consult     Precautions / Restrictions Precautions Precautions: (P) Fall Precaution Comments: (P) Lt UE humeral fx  Required Braces or Orthoses: (P) Sling Restrictions Weight Bearing Restrictions: (P) Yes LUE Weight Bearing: (P) Non weight bearing LLE Weight Bearing: (P) Weight bearing as tolerated      Mobility  Bed Mobility Overal bed mobility: Needs Assistance Bed Mobility: Supine to Sit     Supine to sit: +2 for physical assistance;Mod assist;HOB elevated     General bed mobility comments: use of bed pad to scoot hips to EOB, assist with LLE off bed, assist to elevate trunk  Transfers Overall transfer level: Needs assistance Equipment used: None Transfers: Sit to/from [employer] Transfers Sit to Stand: +2 physical  assistance;Min assist Stand pivot transfers: +2 physical assistance;Mod assist       General transfer comment: pivot steps toward right, bed to recliner, with upright posture  Ambulation/Gait             General Gait Details: unable due to pain with WB LLE and inability to use RW due to L humeral fx  Stairs            Wheelchair Mobility    Modified Rankin (Stroke Patients Only)       Balance Overall balance assessment: Needs assistance Sitting-balance support: No upper extremity supported;Feet supported Sitting balance-Leahy Scale: Good     Standing balance support: Single extremity supported;During functional activity Standing balance-Leahy Scale: Poor Standing balance comment: reliant on external support                             Pertinent Vitals/Pain Pain Assessment: (P) Faces Faces Pain Scale: Hurts even more Pain Location: L thigh Pain Descriptors / Indicators: Sore;Grimacing;Operative site guarding Pain Intervention(s): Monitored during session;Repositioned    Home Living Family/patient expects to be discharged to:: (P) Private residence Living Arrangements: (P) Children Available Help at Discharge: (P) Family;Available PRN/intermittently Type of Home: (P) House Home Access: (P) Stairs to enter Entrance Stairs-Rails: (P) Right;Left Entrance Stairs-Number of Steps: (P) 2 Home Layout: (P) Two level   Additional Comments: (P) Pt lives with son and grandson     Prior Function Level of Independence: (P) Independent         Comments: (P)  son provided short-term assist with bathing and dressing during the first 2 weeks following L humeral fx     Hand Dominance   Dominant Hand: (P) Right    Extremity/Trunk Assessment   Upper Extremity Assessment Upper Extremity Assessment: Defer to OT evaluation    Lower Extremity Assessment Lower Extremity Assessment: LLE deficits/detail LLE Deficits / Details: s/p THA LLE: Unable to  fully assess due to pain    Cervical / Trunk Assessment Cervical / Trunk Assessment: Normal  Communication   Communication: (P) No difficulties  Cognition Arousal/Alertness: Awake/alert Behavior During Therapy: WFL for tasks assessed/performed Overall Cognitive Status: History of cognitive impairments - at baseline                                 General Comments: h/o dementia      General Comments      Exercises     Assessment/Plan    PT Assessment Patient needs continued PT services  PT Problem List Decreased mobility;Decreased safety awareness;Decreased knowledge of precautions;Decreased activity tolerance;Decreased cognition;Pain;Decreased knowledge of use of DME;Decreased balance       PT Treatment Interventions DME instruction;Therapeutic activities    PT Goals (Current goals can be found in the Care Plan section)  Acute Rehab PT Goals Patient Stated Goal: home PT Goal Formulation: With patient/family Time For Goal Achievement: 01/06/18 Potential to Achieve Goals: Good    Frequency Min 3X/week   Barriers to discharge        Co-evaluation PT/OT/SLP Co-Evaluation/Treatment: Yes Reason for Co-Treatment: For patient/therapist safety;To address functional/ADL transfers PT goals addressed during session: Mobility/safety with mobility;Balance OT goals addressed during session: ADL's and self-care       AM-PAC PT "6 Clicks" Daily Activity  Outcome Measure Difficulty turning over in bed (including adjusting bedclothes, sheets and blankets)?: A Lot Difficulty moving from lying on back to sitting on the side of the bed? : Unable Difficulty sitting down on and standing up from a chair with arms (e.g., wheelchair, bedside commode, etc,.)?: Unable Help needed moving to and from a bed to chair (including a wheelchair)?: A Lot Help needed walking in hospital room?: A Lot Help needed climbing 3-5 steps with a railing? : Total 6 Click Score: 9    End of  Session Equipment Utilized During Treatment: Gait belt;Other (comment)(LUE sling) Activity Tolerance: Patient tolerated treatment well Patient left: in chair;with call bell/phone within reach;with family/visitor present Nurse Communication: Mobility status;Weight bearing status PT Visit Diagnosis: Other abnormalities of gait and mobility (R26.89);Repeated falls (R29.6);Pain Pain - Right/Left: Left Pain - part of body: Leg    Time: 5361-4431 PT Time Calculation (min) (ACUTE ONLY): 24 min   Charges:   PT Evaluation $PT Eval Moderate Complexity: 1 Mod          Aida Raider, PT  Office # [phone] Pager [phone]   Ilda Foil 12/23/2017, 2:08 PM

## 2017-12-23 NOTE — Plan of Care (Signed)
  Problem: Activity: Goal: Risk for activity intolerance will decrease Outcome: Progressing   Problem: Nutrition: Goal: Adequate nutrition will be maintained Outcome: Progressing   Problem: Pain Managment: Goal: General experience of comfort will improve Outcome: Progressing   Problem: Safety: Goal: Ability to remain free from injury will improve Outcome: Progressing   Problem: Skin Integrity: Goal: Risk for impaired skin integrity will decrease Outcome: Progressing   

## 2017-12-23 NOTE — Progress Notes (Signed)
Pt removed her IV and Mepilex dressing and states that she "wants to go home." Pt is re-oriented, new dressing applied, pain med is given. Pt's son is at the bedside. RN will continue to monitor.

## 2017-12-23 NOTE — Evaluation (Signed)
Occupational Therapy Evaluation Patient Details Name: Connie Snyder MRN: [id number] DOB: 16-Jun-1927 Today's Date: 12/23/2017    History of Present Illness  Pt is an 84 y.o. female with prior history of dementia, Sjogren's disease/arthritis, and L proximal humeral fx sustained in a fall approx 3 weeks ago. She was admitted following a second mechanical fall sustaining a left hip fracture. She underwent L THA 12-22-17.    Clinical Impression   Pt admitted with above. She demonstrates the below listed deficits and will benefit from continued OT to maximize safety and independence with BADLs.  Pt requires min A for bed mobility, min A for UB ADLs, max A, overall for LB ADLs, and mod A +2 for functional transfers. She lives with her son, who is supportive.  She had recently returned to mod I with ADLs after her humeral fx.  I anticipate she will progress nicely with rehab efforts, and would benefit from the consistency and intensity of CIR to allow her to maximize her ability to regain and maintain her independence.  Will follow acutely.       Follow Up Recommendations  CIR;Supervision - Intermittent    Equipment Recommendations  3 in 1 bedside commode    Recommendations for Other Services Rehab consult     Precautions / Restrictions Precautions Precautions: Fall Precaution Comments: Lt UE humeral fx  Required Braces or Orthoses: Sling Restrictions Weight Bearing Restrictions: Yes LUE Weight Bearing: Non weight bearing LLE Weight Bearing: Weight bearing as tolerated      Mobility Bed Mobility Overal bed mobility: Needs Assistance Bed Mobility: Supine to Sit     Supine to sit: +2 for physical assistance;Mod assist;HOB elevated     General bed mobility comments: use of bed pad to scoot hips to EOB, assist with LLE off bed, assist to elevate trunk  Transfers Overall transfer level: Needs assistance Equipment used: 1 person hand held assist Transfers: Sit to/from Stand;Stand  Pivot Transfers Sit to Stand: +2 physical assistance;Min assist Stand pivot transfers: +2 physical assistance;Mod assist       General transfer comment: pivot steps toward right, bed to recliner, with upright posture    Balance Overall balance assessment: Needs assistance Sitting-balance support: No upper extremity supported;Feet supported Sitting balance-Leahy Scale: Good     Standing balance support: Single extremity supported;During functional activity Standing balance-Leahy Scale: Poor Standing balance comment: reliant on external support                           ADL either performed or assessed with clinical judgement   ADL Overall ADL's : Needs assistance/impaired Eating/Feeding: Set up;Sitting   Grooming: Wash/dry hands;Wash/dry face;Oral care;Brushing hair;Set up;Sitting   Upper Body Bathing: Minimal assistance;Sitting   Lower Body Bathing: Maximal assistance;Sit to/from stand   Upper Body Dressing : Sitting;Minimal assistance   Lower Body Dressing: +2 for physical assistance;Sit to/from stand   Toilet Transfer: Moderate assistance;+2 for physical assistance;+2 for safety/equipment;BSC   Toileting- Clothing Manipulation and Hygiene: Sit to/from stand;Maximal assistance       Functional mobility during ADLs: Moderate assistance;+2 for physical assistance;+2 for safety/equipment       Vision         Perception     Praxis      Pertinent Vitals/Pain Pain Assessment: Faces Faces Pain Scale: Hurts even more Pain Location: L thigh Pain Descriptors / Indicators: Sore;Grimacing;Operative site guarding Pain Intervention(s): Monitored during session     Hand Dominance Right  Extremity/Trunk Assessment Upper Extremity Assessment Upper Extremity Assessment: LUE deficits/detail LUE Deficits / Details: h/o recent humeral fx.  Not formally assessed    Lower Extremity Assessment Lower Extremity Assessment: Defer to PT evaluation LLE Deficits /  Details: s/p THA LLE: Unable to fully assess due to pain   Cervical / Trunk Assessment Cervical / Trunk Assessment: Normal   Communication Communication Communication: No difficulties   Cognition Arousal/Alertness: Awake/alert Behavior During Therapy: WFL for tasks assessed/performed Overall Cognitive Status: History of cognitive impairments - at baseline                                 General Comments: h/o dementia   General Comments       Exercises     Shoulder Instructions      Home Living Family/patient expects to be discharged to:: Private residence Living Arrangements: Children Available Help at Discharge: Family;Available PRN/intermittently Type of Home: House Home Access: Stairs to enter [employer] of Steps: 2 Entrance Stairs-Rails: Right;Left Home Layout: Two level Alternate Level Stairs-Number of Steps: 8 Alternate Level Stairs-Rails: Right;Left Bathroom Shower/Tub: Chief Strategy Officer: Standard         Additional Comments: Pt lives with son and grandson       Prior Functioning/Environment Level of Independence: Independent        Comments: son provided short-term assist with bathing and dressing during the first 2 weeks following L humeral fx        OT Problem List: Decreased strength;Decreased activity tolerance;Impaired balance (sitting and/or standing);Decreased cognition;Decreased safety awareness;Pain      OT Treatment/Interventions: Self-care/ADL training;DME and/or AE instruction;Therapeutic activities;Cognitive remediation/compensation;Patient/family education;Balance training    OT Goals(Current goals can be found in the care plan section) Acute Rehab OT Goals Patient Stated Goal: home OT Goal Formulation: With patient/family Time For Goal Achievement: 12/30/17 Potential to Achieve Goals: Good ADL Goals Pt Will Perform Upper Body Bathing: with supervision;sitting Pt Will Perform Lower Body  Bathing: with min assist;sit to/from stand Pt Will Perform Upper Body Dressing: with supervision;sitting Pt Will Perform Lower Body Dressing: with min assist;sit to/from stand Pt Will Transfer to Toilet: with min assist;ambulating;regular height toilet;bedside commode;grab bars Pt Will Perform Toileting - Clothing Manipulation and hygiene: with min assist;sit to/from stand  OT Frequency: Min 2X/week   Barriers to D/C:            Co-evaluation PT/OT/SLP Co-Evaluation/Treatment: Yes Reason for Co-Treatment: For patient/therapist safety;To address functional/ADL transfers PT goals addressed during session: Mobility/safety with mobility;Balance OT goals addressed during session: ADL's and self-care      AM-PAC PT "6 Clicks" Daily Activity     Outcome Measure Help from another person eating meals?: None Help from another person taking care of personal grooming?: A Little Help from another person toileting, which includes using toliet, bedpan, or urinal?: A Lot Help from another person bathing (including washing, rinsing, drying)?: A Lot Help from another person to put on and taking off regular upper body clothing?: A Little Help from another person to put on and taking off regular lower body clothing?: Total 6 Click Score: 15   End of Session Equipment Utilized During Treatment: Gait belt Nurse Communication: Mobility status  Activity Tolerance: Patient tolerated treatment well Patient left: in chair;with call bell/phone within reach  OT Visit Diagnosis: Unsteadiness on feet (R26.81);Pain Pain - Right/Left: Left Pain - part of body: Hip  Time: 0737-1062 OT Time Calculation (min): 19 min Charges:  OT General Charges $OT Visit: 1 Visit OT Evaluation $OT Eval Moderate Complexity: 1 Mod  Jeani Hawking, OTR/L Acute Rehabilitation Services Pager [phone] Office [phone]   Jeani Hawking M 12/23/2017, 2:23 PM

## 2017-12-23 NOTE — Progress Notes (Signed)
   Subjective:  Patient reports pain as mild.  Mainly soreness  Objective:   VITALS:   Vitals:   12/22/17 1745 12/22/17 1832 12/23/17 0031 12/23/17 0617  BP: (!) 114/56 114/70 124/73 104/65  Pulse: (!) 111 (!) 102 93 98  Resp: (!) 22  16 17   Temp:  98.3 F (36.8 C) 98.9 F (37.2 C) 99.2 F (37.3 C)  TempSrc:  Oral Oral Oral  SpO2: 99% 98% 99% 99%  Weight:      Height:        Neurologically intact Neurovascular intact Sensation intact distally Intact pulses distally Dorsiflexion/Plantar flexion intact Incision: dressing C/D/I and no drainage No cellulitis present Compartment soft   Lab Results  Component Value Date   WBC 12.6 (H) 12/23/2017   HGB 8.9 (L) 12/23/2017   HCT 25.6 (L) 12/23/2017   MCV 84.2 12/23/2017   PLT 297 12/23/2017     Assessment/Plan:  1 Day Post-Op   - Expected postop acute blood loss anemia - will monitor for symptoms - Up with PT/OT - DVT ppx - SCDs, ambulation, lovenox - WBAT LLE - NWB LUE, sling for comfort - no hip precautions - Pain control - Discharge planning - will need SNF  12/25/2017 12/23/2017, 8:13 AM [phone]

## 2017-12-23 NOTE — Progress Notes (Signed)
PROGRESS NOTE        PATIENT DETAILS Name: Connie Snyder Age: 84 y.o. Sex: female Date of Birth: [birth year] Admit Date: 12/21/2017 Admitting Physician Ejiroghene Wendall Stade, MD KLK:[id number], No Pcp Per  Brief Narrative: Patient is a 84 y.o. female with prior history of dementia, Sjogren's disease/arthritis on methotrexate admitted following a mechanical fall found to have a left hip fracture.  See below for further details.  Subjective: Some mild pain at the operative site-but no major events overnight.  No chest pain or shortness of breath.  Assessment/Plan: Left hip fracture: Following a mechanical fall, underwent left hip replacement on 9/20.  Orthopedics following, WBAT LLE, NWB LUE, continue Lovenox for prophylaxis.  Await PT eval-suspect will require SNF.   Anemia: Mild perioperative blood loss anemia, no indication for transfusion-follow for now.  Recent history of left proximal humerus fracture: Approximately 3 weeks prior to this hospital-followed by Dr. Roe Rutherford NWB to LUE  Dementia: Pleasantly confused-very close to usual baseline.  Continue Namenda and Aricept.  May have mild delirium due to narcotics/acute illness-family aware.    Left bundle branch block: Probably chronic-no prior EKGs to compare with.  She has tolerated surgery well.  She apparently is highly functional-unable to play with her grandson.  She has no prior history of CAD.   Hypertension: BP controlled-continue to hold all antihypertensives.  Will resume when able.  Sjogren's disease/arthritis: Continue to hold methotrexate.  DVT Prophylaxis: Prophylactic Lovenox.  Code Status: Full code  Family Communication: None at bedside  Disposition Plan: Remain inpatient-probably SNF on discharge sometime early next week.  Antimicrobial agents: Anti-infectives (From admission, onward)   Start     Dose/Rate Route Frequency Ordered Stop   12/23/17 0600  ceFAZolin  (ANCEF) IVPB 2g/100 mL premix     2 g 200 mL/hr over 30 Minutes Intravenous On call to O.R. 12/22/17 1316 12/22/17 1447   12/22/17 2100  ceFAZolin (ANCEF) IVPB 2g/100 mL premix     2 g 200 mL/hr over 30 Minutes Intravenous Every 6 hours 12/22/17 1813 12/23/17 0918   12/22/17 1512  vancomycin (VANCOCIN) powder  Status:  Discontinued       As needed 12/22/17 1512 12/22/17 1613   12/22/17 1321  ceFAZolin (ANCEF) 2-4 GM/100ML-% IVPB    Note to Pharmacy:  Aquilla Hacker   : cabinet override      12/22/17 1321 12/22/17 1417     Procedures: None  CONSULTS:  orthopedic surgery  Time spent: 25- minutes-Greater than 50% of this time was spent in counseling, explanation of diagnosis, planning of further management, and coordination of care.  MEDICATIONS: Scheduled Meds: . acetaminophen  500 mg Oral Q6H  . Chlorhexidine Gluconate Cloth  6 each Topical Daily  . docusate sodium  100 mg Oral BID  . donepezil  5 mg Oral QHS  . enoxaparin (LOVENOX) injection  40 mg Subcutaneous Q24H  . memantine  5 mg Oral Daily  . mirtazapine  30 mg Oral QHS  . mupirocin ointment  1 application Nasal BID   Continuous Infusions: . sodium chloride 75 mL/hr at 12/22/17 2013  . lactated ringers 10 mL/hr at 12/22/17 1313  . methocarbamol (ROBAXIN) IV     PRN Meds:.acetaminophen, alum & mag hydroxide-simeth, HYDROcodone-acetaminophen, HYDROcodone-acetaminophen, magnesium citrate, menthol-cetylpyridinium **OR** phenol, methocarbamol **OR** methocarbamol (ROBAXIN) IV, morphine injection, ondansetron **OR** ondansetron (ZOFRAN) IV, polyethylene glycol,  senna-docusate, sorbitol   PHYSICAL EXAM: Vital signs: Vitals:   12/22/17 1745 12/22/17 1832 12/23/17 0031 12/23/17 0617  BP: (!) 114/56 114/70 124/73 104/65  Pulse: (!) 111 (!) 102 93 98  Resp: (!) 22  16 17   Temp:  98.3 F (36.8 C) 98.9 F (37.2 C) 99.2 F (37.3 C)  TempSrc:  Oral Oral Oral  SpO2: 99% 98% 99% 99%  Weight:      Height:       Filed  Weights   12/21/17 1757 12/22/17 0415  Weight: 81.6 kg 81.6 kg   Body mass index is 29.05 kg/m.   General appearance:Awake, alert, not in any distress.  Eyes:no scleral icterus. HEENT: Atraumatic and Normocephalic Neck: supple, no JVD. Resp:Good air entry bilaterally,no rales or rhonchi CVS: S1 S2 regular, no murmurs.  GI: Bowel sounds present, Non tender and not distended with no gaurding, rigidity or rebound. Extremities: B/L Lower Ext shows no edema, both legs are warm to touch Neurology:  Non focal Psychiatric: Normal judgment and insight. Normal mood. Musculoskeletal:No digital cyanosis Skin:No Rash, warm and dry Wounds:N/A  I have personally reviewed following labs and imaging studies  LABORATORY DATA: CBC: Recent Labs  Lab 12/21/17 1838 12/22/17 0335 12/22/17 1839 12/23/17 0408  WBC 7.8 10.9* 19.3* 12.6*  NEUTROABS 6.2  --   --   --   HGB 10.7* 10.1* 10.4* 8.9*  HCT 30.1* 28.5* 30.4* 25.6*  MCV 82.7 83.8 84.4 84.2  PLT 416* 345 372 297    Basic Metabolic Panel: Recent Labs  Lab 12/21/17 1838 12/22/17 0335 12/22/17 1839 12/23/17 0408  NA 141 138  --  135  K 4.0 4.1  --  3.9  CL 104 105  --  102  CO2 28 26  --  22  GLUCOSE 97 107*  --  184*  BUN 11 7*  --  12  CREATININE 0.48 0.52 0.54 0.61  CALCIUM 8.8* 8.6*  --  8.3*    GFR: Estimated Creatinine Clearance: 53.3 mL/min (by C-G formula based on SCr of 0.61 mg/dL).  Liver Function Tests: No results for input(s): AST, ALT, ALKPHOS, BILITOT, PROT, ALBUMIN in the last 168 hours. No results for input(s): LIPASE, AMYLASE in the last 168 hours. No results for input(s): AMMONIA in the last 168 hours.  Coagulation Profile: No results for input(s): INR, PROTIME in the last 168 hours.  Cardiac Enzymes: Recent Labs  Lab 12/22/17 0639  TROPONINI <0.03    BNP (last 3 results) No results for input(s): PROBNP in the last 8760 hours.  HbA1C: No results for input(s): HGBA1C in the last 72  hours.  CBG: No results for input(s): GLUCAP in the last 168 hours.  Lipid Profile: No results for input(s): CHOL, HDL, LDLCALC, TRIG, CHOLHDL, LDLDIRECT in the last 72 hours.  Thyroid Function Tests: No results for input(s): TSH, T4TOTAL, FREET4, T3FREE, THYROIDAB in the last 72 hours.  Anemia Panel: No results for input(s): VITAMINB12, FOLATE, FERRITIN, TIBC, IRON, RETICCTPCT in the last 72 hours.  Urine analysis:    Component Value Date/Time   COLORURINE YELLOW 12/22/2017 0742   APPEARANCEUR CLOUDY (A) 12/22/2017 0742   LABSPEC 1.011 12/22/2017 0742   PHURINE 9.0 (H) 12/22/2017 0742   GLUCOSEU NEGATIVE 12/22/2017 0742   HGBUR NEGATIVE 12/22/2017 0742   BILIRUBINUR NEGATIVE 12/22/2017 0742   KETONESUR NEGATIVE 12/22/2017 0742   PROTEINUR NEGATIVE 12/22/2017 0742   NITRITE NEGATIVE 12/22/2017 0742   LEUKOCYTESUR NEGATIVE 12/22/2017 0742    Sepsis Labs: Lactic Acid,  Venous No results found for: LATICACIDVEN  MICROBIOLOGY: Recent Results (from the past 240 hour(s))  Surgical pcr screen     Status: Abnormal   Collection Time: 12/21/17 10:48 PM  Result Value Ref Range Status   MRSA, PCR NEGATIVE NEGATIVE Final   Staphylococcus aureus POSITIVE (A) NEGATIVE Final    Comment: (NOTE) The Xpert SA Assay (FDA approved for NASAL specimens in patients 97 years of age and older), is one component of a comprehensive surveillance program. It is not intended to diagnose infection nor to guide or monitor treatment. Performed at [facility], [address], [place], [place] [address]     RADIOLOGY STUDIES/RESULTS: Dg Chest 1 View  Result Date: 12/21/2017 CLINICAL DATA:  Patient fell today while walking in her house. Left hip and knee pain. EXAM: CHEST  1 VIEW COMPARISON:  Left shoulder radiographs 12/07/2017 FINDINGS: Redemonstration of subacute displaced overlapping fracture of the proximal left humerus involving the surgical neck. Osteoarthritis of the [facility] and both  glenohumeral joints. Cardiomegaly with aortic atherosclerosis. No acute pulmonary consolidation or pneumothorax. No effusion. No acute displaced rib fracture. IMPRESSION: Cardiomegaly with aortic atherosclerosis. No active pulmonary disease. Known subacute left surgical neck fracture of the humerus with displaced humeral diaphysis as before. Electronically Signed   By: Tollie Eth M.D.   On: 12/21/2017 19:48   Pelvis Portable  Result Date: 12/22/2017 CLINICAL DATA:  Anterior approach left hip arthroplasty EXAM: PORTABLE PELVIS 1-2 VIEWS COMPARISON:  12/22/2017 intraoperative radiographs. FINDINGS: Postop change with soft tissue emphysema overlying the left hip is identified. Intact uncemented left total hip arthroplasty without complicating features or postoperative fracture. Degenerative disc disease and facet arthrosis of the included lower lumbar spine from L4 through S1. The bony pelvis appears intact with osteoarthritic change of the SI joints. Pubic symphysis is maintained. The native right hip joint is intact with minimal axial joint space narrowing noted. No fracture of the right hip. IMPRESSION: No immediate complications status post left total hip arthroplasty. Electronically Signed   By: Tollie Eth M.D.   On: 12/22/2017 17:43   Dg Shoulder Left  Result Date: 11/29/2017 CLINICAL DATA:  84 year old who tripped and fell on her porch this evening and injured the LEFT shoulder. Initial encounter. EXAM: LEFT SHOULDER - 2+ VIEW COMPARISON:  LEFT shoulder x-rays and MRI 04/12/2006. FINDINGS: Acute comminuted fracture involving the proximal LEFT humeral metaphysis. Avulsion fracture arising from the humeral head. Narrowed subacromial space. Degenerative changes involving the acromioclavicular joint. Glenohumeral joint anatomically aligned with narrowing of the joint space. Generalized osseous demineralization. IMPRESSION: 1. Acute comminuted fracture involving the proximal LEFT humeral metaphysis. 2.  Avulsion fracture arising from the RIGHT head. 3. Narrowed subacromial space indicating chronic supraspinatus tendon disease. 4. Osseous demineralization. 5. Osteoarthritis involving the glenohumeral joint and the AC joint. Electronically Signed   By: Hulan Saas M.D.   On: 11/29/2017 21:03   Dg Shoulder Left Port  Result Date: 12/22/2017 CLINICAL DATA:  Left shoulder fracture.  Follow-up. EXAM: LEFT SHOULDER - 1 VIEW COMPARISON:  11/29/2017 FINDINGS: Left humeral neck fracture again noted. There is anterior displacement on the Y-view with significant angulation. This has changed since prior study. No subluxation or dislocation. IMPRESSION: Increasing anterior displacement and angulation at the left humeral neck fracture. Electronically Signed   By: Charlett Nose M.D.   On: 12/22/2017 09:19   Dg C-arm 1-60 Min  Result Date: 12/22/2017 CLINICAL DATA:  LEFT hip fracture. EXAM: DG C-ARM 61-120 MIN; OPERATIVE LEFT HIP WITH  PELVIS COMPARISON:  Hip films 12/21/2017. FINDINGS: LEFT total hip arthroplasty.  Satisfactory position and alignment. IMPRESSION: No adverse features. Electronically Signed   By: Elsie Stain M.D.   On: 12/22/2017 15:48   Dg Hip Operative Unilat W Or W/o Pelvis Left  Result Date: 12/22/2017 CLINICAL DATA:  LEFT hip fracture. EXAM: DG C-ARM 61-120 MIN; OPERATIVE LEFT HIP WITH PELVIS COMPARISON:  Hip films 12/21/2017. FINDINGS: LEFT total hip arthroplasty.  Satisfactory position and alignment. IMPRESSION: No adverse features. Electronically Signed   By: Elsie Stain M.D.   On: 12/22/2017 15:48   Dg Hip Unilat With Pelvis 2-3 Views Left  Result Date: 12/21/2017 CLINICAL DATA:  Fall.  Left hip pain. EXAM: DG HIP (WITH OR WITHOUT PELVIS) 2-3V LEFT COMPARISON:  None. FINDINGS: Bones are diffusely demineralized. Frontal pelvis shows unremarkable SI joints and symphysis pubis. Left femoral neck fracture in subcapsular to transcervical location shows varus angulation. No worrisome lytic  or sclerotic osseous abnormality. IMPRESSION: Left femoral neck fracture with varus angulation. Electronically Signed   By: Kennith Center M.D.   On: 12/21/2017 19:47   Dg Femur Min 2 Views Left  Result Date: 12/21/2017 CLINICAL DATA:  Fall.  Left hip and knee pain. EXAM: LEFT FEMUR 2 VIEWS COMPARISON:  None. FINDINGS: Two views study reviewed along with the left hip exam. Two-view exam of the left femur shows a femoral neck fracture with varus angulation. No worrisome lytic or sclerotic osseous abnormality. Advanced degenerative changes are evident at the knee with probable intra-articular loose body. IMPRESSION: 1. Femoral neck fracture with varus angulation. 2. Marked degenerative changes in the knee with intra-articular loose body. Electronically Signed   By: Kennith Center M.D.   On: 12/21/2017 19:45   Xr Humerus Left  Result Date: 12/21/2017 2 views of the left proximal humerus show a 2 part proximal humerus fracture with slight interval healing.  Xr Shoulder 1v Left  Result Date: 12/07/2017 Single view of the left shoulder shows a 2 part proximal humerus fracture that is displaced.    LOS: 2 days   Jeoffrey Massed, MD  [facility]  If 7PM-7AM, please contact night-coverage  Please page via [email or web address]-Password [email or web address]-click on MD name and type text message  12/23/2017, 12:51 PM

## 2017-12-24 DIAGNOSIS — S72002A Fracture of unspecified part of neck of left femur, initial encounter for closed fracture: Principal | ICD-10-CM

## 2017-12-24 DIAGNOSIS — S42202A Unspecified fracture of upper end of left humerus, initial encounter for closed fracture: Secondary | ICD-10-CM

## 2017-12-24 DIAGNOSIS — D62 Acute posthemorrhagic anemia: Secondary | ICD-10-CM

## 2017-12-24 LAB — BASIC METABOLIC PANEL
ANION GAP: 9 (ref 5–15)
BUN: 15 mg/dL (ref 8–23)
CALCIUM: 8.1 mg/dL — AB (ref 8.9–10.3)
CO2: 26 mmol/L (ref 22–32)
Chloride: 101 mmol/L (ref 98–111)
Creatinine, Ser: 0.54 mg/dL (ref 0.44–1.00)
GFR calc non Af Amer: >60 mL/min (ref >60)
GLUCOSE: 103 mg/dL — AB (ref 70–99)
POTASSIUM: 3.9 mmol/L (ref 3.5–5.1)
Sodium: 136 mmol/L (ref 135–145)

## 2017-12-24 LAB — CBC
HCT: 24 % — ABNORMAL LOW (ref 36.0–46.0)
Hemoglobin: 8.5 g/dL — ABNORMAL LOW (ref 12.0–15.0)
MCH: 29.2 pg (ref 26.0–34.0)
MCHC: 35.4 g/dL (ref 30.0–36.0)
MCV: 82.5 fL (ref 78.0–100.0)
PLATELETS: 284 10*3/uL (ref 150–400)
RBC: 2.91 MIL/uL — AB (ref 3.87–5.11)
RDW: 15.9 % — AB (ref 11.5–15.5)
WBC: 11.4 10*3/uL — AB (ref 4.0–10.5)

## 2017-12-24 MED ORDER — MAGIC MOUTHWASH W/LIDOCAINE
10.0000 mL | Freq: Four times a day (QID) | ORAL | Status: DC | PRN
  Filled 2017-12-24: qty 10

## 2017-12-24 NOTE — Consult Note (Signed)
Physical Medicine and Rehabilitation Consult Reason for Consult: Decreased mobility after left femoral neck fracture Referring Phsyician: CONSTANT KRONINGER is an 84 y.o. female.   HPI: Patient fell on 12/21/2017 and sustained left femoral neck fracture.  No loss of consciousness.  Patient reportedly slid onto the floor.  Prior history of mild dementia on Namenda and Aricept but remained independent and active physically.  Previously she had a left proximal humerus fracture after a fall approximately 3 weeks prior to admission.  Her past medical history is also significant for Sjogren's disease and arthritis.  She is on methotrexate. Patient had an elevated temp at admission, work-up included UA and chest x-ray which were negative.  Initial white count was up to 19 K but subsequent follow-ups including 12/24/2017 down to 11.4K.  Troponin was negative.  Basic metabolic package revealed normal kidney function. Repeat left shoulder x-ray demonstrated some increased anterior displacement and angulation of left humeral neck fracture.  Left hip total arthroplasty was performed on 12/22/2017 via anterior approach.  Preoperative hemoglobin was 10.4 postoperative hemoglobin down to 8.5  Prior to admission patient had a functional decline related to her humeral fracture but was able to regain functional independence just prior to her most recent fall.  She lives with her son. OT evaluation on 12/23/2017+ to mod assist for functional transfers, min assist upper body ADLs max assist lower body ADLs PT evaluation 12/23/2017, +2 mod assist bed mobility, plus to min assist to stand, plus to mod assist standing pivot transfers bed to recliner.  Did not ambulate during session  Patient unaware of left hip or left humeral fracture.  Review of Systems - Unable to obtain because of cognition.  Past Medical History:  Diagnosis Date  . Arthritis   . Sjogren's disease (HCC)    History reviewed. No pertinent surgical  history. Family History  Family history unknown: Yes   Social History:  reports that she has never smoked. She has never used smokeless tobacco. Her alcohol and drug histories are not on file. Allergies: No Known Allergies Medications Prior to Admission  Medication Sig Dispense Refill  . aspirin 325 MG tablet Take 650 mg by mouth daily.    . cholecalciferol (VITAMIN D) 1000 units tablet Take 1,000 Units by mouth daily.    . feeding supplement (BOOST HIGH PROTEIN) LIQD Take 1 Container by mouth 2 (two) times daily between meals.    . ferrous sulfate 325 (65 FE) MG tablet Take 325 mg by mouth daily with breakfast.    . folic acid (FOLVITE) 400 MCG tablet Take 800 mcg by mouth daily.    . memantine (NAMENDA) 5 MG tablet Take 5 mg by mouth daily.  2  . methotrexate (RHEUMATREX) 2.5 MG tablet Take 15 mg by mouth once a week.  0  . mirtazapine (REMERON) 15 MG tablet Take 15 mg by mouth every evening.  12  . mirtazapine (REMERON) 30 MG tablet Take 30 mg by mouth at bedtime.   3  . triamterene-hydrochlorothiazide (MAXZIDE-25) 37.5-25 MG tablet Take 0.5 tablets by mouth daily.  3  . vitamin B-12 (CYANOCOBALAMIN) 1000 MCG tablet Take 1,000 mcg by mouth daily.    Marland Kitchen donepezil (ARICEPT) 5 MG tablet     . oxyCODONE (ROXICODONE) 5 MG immediate release tablet Take 1-2 tablets (5-10 mg total) by mouth every 4 (four) hours as needed for severe pain. 40 tablet 0  . oxyCODONE-acetaminophen (PERCOCET/ROXICET) 5-325 MG tablet Take 1 tablet by mouth every 4 (four) hours  as needed for severe pain. (Patient not taking: Reported on 12/22/2017) 20 tablet 0    Home: Home Living Family/patient expects to be discharged to:: Private residence Living Arrangements: Children Available Help at Discharge: Family, Available PRN/intermittently Type of Home: House Home Access: Stairs to enter Secretary/administrator of Steps: 2 Entrance Stairs-Rails: Right, Left Home Layout: Two level Alternate Level Stairs-Number of  Steps: 8 Alternate Level Stairs-Rails: Right, Left Bathroom Shower/Tub: Engineer, manufacturing systems: Standard Additional Comments: Pt lives with son and grandson   Functional History: Prior Function Comments: son provided short-term assist with bathing and dressing during the first 2 weeks following L humeral fx Functional Status:  Mobility:     Ambulation/Gait General Gait Details: unable due to pain with WB LLE and inability to use RW due to L humeral fx    ADL:    Cognition: Cognition Overall Cognitive Status: History of cognitive impairments - at baseline Orientation Level: Oriented to person, Disoriented to situation, Disoriented to time, Disoriented to place Cognition Arousal/Alertness: Awake/alert Behavior During Therapy: WFL for tasks assessed/performed Overall Cognitive Status: History of cognitive impairments - at baseline General Comments: h/o dementia  Blood pressure 113/61, pulse (!) 102, temperature 98.3 F (36.8 C), temperature source Oral, resp. rate 16, height 5' 5.98" (1.676 m), weight 81.6 kg, SpO2 100 %. Physical Exam  Nursing note and vitals reviewed. Constitutional: She is oriented to person, place, and time. She appears well-developed and well-nourished. No distress.  HENT:  Head: Normocephalic and atraumatic.  Eyes: Pupils are equal, round, and reactive to light. Conjunctivae and EOM are normal.  Neck: Normal range of motion.  Cardiovascular: Normal rate and regular rhythm.  No murmur heard. Respiratory: Effort normal and breath sounds normal. No respiratory distress. She has no wheezes.  GI: Soft. Bowel sounds are normal. She exhibits no distension. There is no tenderness.  Musculoskeletal:  No pain with left shoulder or left hip range of motion  Neurological: She is alert and oriented to person, place, and time. Gait abnormal.  Motor strength is 4- in the left bicep tricep grip and limited range of motion left shoulder, 4- at the left equal  dorsiflexion plantar flexor, 2- hip flexor Right side is 5/5 in the deltoid bicep tricep grip hip flexor knee extensor ankle dorsi flexion.  Gait not tested, please see PT notes Sensation reported equal to light touch in bilateral upper and lower limbs  Skin: Skin is warm and dry. She is not diaphoretic.  Psychiatric: She has a normal mood and affect.    Results for orders placed or performed during the hospital encounter of 12/21/17 (from the past 24 hour(s))  CBC     Status: Abnormal   Collection Time: 12/24/17  7:07 AM  Result Value Ref Range   WBC 11.4 (H) 4.0 - 10.5 K/uL   RBC 2.91 (L) 3.87 - 5.11 MIL/uL   Hemoglobin 8.5 (L) 12.0 - 15.0 g/dL   HCT 40.8 (L) 14.4 - 81.8 %   MCV 82.5 78.0 - 100.0 fL   MCH 29.2 26.0 - 34.0 pg   MCHC 35.4 30.0 - 36.0 g/dL   RDW 56.3 (H) 14.9 - 70.2 %   Platelets 284 150 - 400 K/uL  Basic metabolic panel     Status: Abnormal   Collection Time: 12/24/17  7:07 AM  Result Value Ref Range   Sodium 136 135 - 145 mmol/L   Potassium 3.9 3.5 - 5.1 mmol/L   Chloride 101 98 - 111 mmol/L  CO2 26 22 - 32 mmol/L   Glucose, Bld 103 (H) 70 - 99 mg/dL   BUN 15 8 - 23 mg/dL   Creatinine, Ser 2.70 0.44 - 1.00 mg/dL   Calcium 8.1 (L) 8.9 - 10.3 mg/dL   GFR calc non Af Amer >60 >60 mL/min   GFR calc Af Amer >60 >60 mL/min   Anion gap 9 5 - 15   Pelvis Portable  Result Date: 12/22/2017 CLINICAL DATA:  Anterior approach left hip arthroplasty EXAM: PORTABLE PELVIS 1-2 VIEWS COMPARISON:  12/22/2017 intraoperative radiographs. FINDINGS: Postop change with soft tissue emphysema overlying the left hip is identified. Intact uncemented left total hip arthroplasty without complicating features or postoperative fracture. Degenerative disc disease and facet arthrosis of the included lower lumbar spine from L4 through S1. The bony pelvis appears intact with osteoarthritic change of the SI joints. Pubic symphysis is maintained. The native right hip joint is intact with minimal  axial joint space narrowing noted. No fracture of the right hip. IMPRESSION: No immediate complications status post left total hip arthroplasty. Electronically Signed   By: Tollie Eth M.D.   On: 12/22/2017 17:43   Dg C-arm 1-60 Min  Result Date: 12/22/2017 CLINICAL DATA:  LEFT hip fracture. EXAM: DG C-ARM 61-120 MIN; OPERATIVE LEFT HIP WITH PELVIS COMPARISON:  Hip films 12/21/2017. FINDINGS: LEFT total hip arthroplasty.  Satisfactory position and alignment. IMPRESSION: No adverse features. Electronically Signed   By: Elsie Stain M.D.   On: 12/22/2017 15:48   Dg Hip Operative Unilat W Or W/o Pelvis Left  Result Date: 12/22/2017 CLINICAL DATA:  LEFT hip fracture. EXAM: DG C-ARM 61-120 MIN; OPERATIVE LEFT HIP WITH PELVIS COMPARISON:  Hip films 12/21/2017. FINDINGS: LEFT total hip arthroplasty.  Satisfactory position and alignment. IMPRESSION: No adverse features. Electronically Signed   By: Elsie Stain M.D.   On: 12/22/2017 15:48    Assessment/Plan: Diagnosis: Left femoral neck fracture following mechanical fall without loss of consciousness in a patient who has had a recent left humeral fracture 1. Does the need for close, 24 hr/day medical supervision in concert with the patient's rehab needs make it unreasonable for this patient to be served in a less intensive setting? Yes 2. Co-Morbidities requiring supervision/potential complications: Acute blood loss anemia history of dementia, Sjogren's disease, on chronic methotrexate, at risk for infection 3. Due to bladder management, bowel management, safety, skin/wound care, disease management, medication administration, pain management and patient education, does the patient require 24 hr/day rehab nursing? Yes 4. Does the patient require coordinated care of a physician, rehab nurse, PT (1-2 hrs/day, 5 days/week), OT (1-2 hrs/day, 5 days/week) and SLP (.5-1 hrs/day, 3 days/week) to address physical and functional deficits in the context of the above  medical diagnosis(es)? Yes Addressing deficits in the following areas: balance, endurance, locomotion, strength, transferring, bowel/bladder control, bathing, dressing, feeding, grooming, toileting, cognition and psychosocial support 5. Can the patient actively participate in an intensive therapy program of at least 3 hrs of therapy per day at least 5 days per week? Yes 6. The potential for patient to make measurable gains while on inpatient rehab is good 7. Anticipated functional outcomes upon discharge from inpatients are Sup PT, sup OT, sup SLP 8. Estimated rehab length of stay to reach the above functional goals is: 14-17d 9. Does the patient have adequate social supports to accommodate these discharge functional goals? Yes 10. Anticipated D/C setting: Home 11. Anticipated post D/C treatments: HH therapy 12. Overall Rehab/Functional Prognosis: good  RECOMMENDATIONS: This  patient's condition is appropriate for continued rehabilitative care in the following setting: CIR Patient has agreed to participate in recommended program. Yes Note that insurance prior authorization may be required for reimbursement for recommended care.  Comment: Need to check with orthopedics whether any additional range of motion or weightbearing restrictions are needed for left upper extremity.   Erick Colace 12/24/2017

## 2017-12-24 NOTE — Progress Notes (Addendum)
PROGRESS NOTE        PATIENT DETAILS Name: SAPHRONIA THISTLE Age: 84 y.o. Sex: female Date of Birth: 04/28/1927 Admit Date: 12/21/2017 Admitting Physician Ejiroghene Wendall Stade, MD EXH:[id number], No Pcp Per  Brief Narrative: Patient is a 84 y.o. female with prior history of dementia, Sjogren's disease/arthritis on methotrexate admitted following a mechanical fall found to have a left hip fracture.  See below for further details.  Subjective: Just woke up-somewhat confused but then easily reoriented.  Son at bedside claims that she is having some difficulty swallowing solid food.  See does not have any difficulty with liquid intake.  Assessment/Plan: Left hip fracture: Following a mechanical fall, underwent left hip replacement on 9/20.  Orthopedics following, recommendations are WBAT LLE and Lovenox for VTE prophylaxis.  I have consulted CIR.  Per son-she is having some mild difficulty swallowing some solids-this morning she was able to drink water without any difficulty or pain.  Will add as needed Magic mouthwash-if this issue continues-we will need swallow eval at some point.    Anemia: Slight drop in hemoglobin but no indication for transfusion, etiology of anemia is thought to be perioperative blood loss.  Follow CBC.  Recent history of left proximal humerus fracture: This apparently occurred 3 weeks prior to this hospital stay, follows with Dr. Roe Rutherford NWB to LUE.    Dementia: Pleasantly confused but very close to usual baseline, continue Namenda and Aricept.  Left bundle branch block: Probably chronic-no prior EKGs to compare with.   Hypertension: BP controlled-continue to hold all antihypertensives, resume when able.  Sjogren's disease/arthritis: Continue to hold methotrexate  DVT Prophylaxis: Prophylactic Lovenox.  Code Status: Full code  Family Communication: Son at bedside  Disposition Plan: Remain inpatient-probably SNF on discharge  sometime early next week.  Antimicrobial agents: Anti-infectives (From admission, onward)   Start     Dose/Rate Route Frequency Ordered Stop   12/23/17 0600  ceFAZolin (ANCEF) IVPB 2g/100 mL premix     2 g 200 mL/hr over 30 Minutes Intravenous On call to O.R. 12/22/17 1316 12/22/17 1447   12/22/17 2100  ceFAZolin (ANCEF) IVPB 2g/100 mL premix     2 g 200 mL/hr over 30 Minutes Intravenous Every 6 hours 12/22/17 1813 12/23/17 0918   12/22/17 1512  vancomycin (VANCOCIN) powder  Status:  Discontinued       As needed 12/22/17 1512 12/22/17 1613   12/22/17 1321  ceFAZolin (ANCEF) 2-4 GM/100ML-% IVPB    Note to Pharmacy:  Aquilla Hacker   : cabinet override      12/22/17 1321 12/22/17 1417     Procedures: None  CONSULTS:  orthopedic surgery  Time spent: 25- minutes-Greater than 50% of this time was spent in counseling, explanation of diagnosis, planning of further management, and coordination of care.  MEDICATIONS: Scheduled Meds: . Chlorhexidine Gluconate Cloth  6 each Topical Daily  . docusate sodium  100 mg Oral BID  . donepezil  5 mg Oral QHS  . enoxaparin (LOVENOX) injection  40 mg Subcutaneous Q24H  . memantine  5 mg Oral Daily  . mirtazapine  30 mg Oral QHS  . mupirocin ointment  1 application Nasal BID   Continuous Infusions: . sodium chloride 75 mL/hr at 12/22/17 2013  . methocarbamol (ROBAXIN) IV     PRN Meds:.acetaminophen, alum & mag hydroxide-simeth, HYDROcodone-acetaminophen, HYDROcodone-acetaminophen, magnesium citrate, menthol-cetylpyridinium **OR**  phenol, methocarbamol **OR** methocarbamol (ROBAXIN) IV, morphine injection, ondansetron **OR** ondansetron (ZOFRAN) IV, polyethylene glycol, senna-docusate, sorbitol   PHYSICAL EXAM: Vital signs: Vitals:   12/23/17 0617 12/23/17 1558 12/23/17 1929 12/24/17 0433  BP: 104/65 (!) 91/58 101/70 113/61  Pulse: 98 100 (!) 106 (!) 102  Resp: 17 15 16 16   Temp: 99.2 F (37.3 C) 98.5 F (36.9 C) 98.6 F (37 C) 98.3 F  (36.8 C)  TempSrc: Oral Oral Oral Oral  SpO2: 99% 100% 100% 100%  Weight:      Height:       Filed Weights   12/21/17 1757 12/22/17 0415  Weight: 81.6 kg 81.6 kg   Body mass index is 29.05 kg/m.   General appearance:Awake, pleasantly confused, not in any distress.  Eyes:no scleral icterus. HEENT: Atraumatic and Normocephalic Neck: supple, no JVD. Resp:Good air entry bilaterally,no rales or rhonchi CVS: S1 S2 regular, no murmurs.  GI: Bowel sounds present, Non tender and not distended with no gaurding, rigidity or rebound. Extremities: B/L Lower Ext shows no edema, both legs are warm to touch Neurology:  Non focal Psychiatric: Normal judgment and insight. Normal mood. Musculoskeletal:No digital cyanosis Skin:No Rash, warm and dry Wounds:N/A  I have personally reviewed following labs and imaging studies  LABORATORY DATA: CBC: Recent Labs  Lab 12/21/17 1838 12/22/17 0335 12/22/17 1839 12/23/17 0408 12/24/17 0707  WBC 7.8 10.9* 19.3* 12.6* 11.4*  NEUTROABS 6.2  --   --   --   --   HGB 10.7* 10.1* 10.4* 8.9* 8.5*  HCT 30.1* 28.5* 30.4* 25.6* 24.0*  MCV 82.7 83.8 84.4 84.2 82.5  PLT 416* 345 372 297 284    Basic Metabolic Panel: Recent Labs  Lab 12/21/17 1838 12/22/17 0335 12/22/17 1839 12/23/17 0408 12/24/17 0707  NA 141 138  --  135 136  K 4.0 4.1  --  3.9 3.9  CL 104 105  --  102 101  CO2 28 26  --  22 26  GLUCOSE 97 107*  --  184* 103*  BUN 11 7*  --  12 15  CREATININE 0.48 0.52 0.54 0.61 0.54  CALCIUM 8.8* 8.6*  --  8.3* 8.1*    GFR: Estimated Creatinine Clearance: 53.3 mL/min (by C-G formula based on SCr of 0.54 mg/dL).  Liver Function Tests: No results for input(s): AST, ALT, ALKPHOS, BILITOT, PROT, ALBUMIN in the last 168 hours. No results for input(s): LIPASE, AMYLASE in the last 168 hours. No results for input(s): AMMONIA in the last 168 hours.  Coagulation Profile: No results for input(s): INR, PROTIME in the last 168 hours.  Cardiac  Enzymes: Recent Labs  Lab 12/22/17 0639  TROPONINI <0.03    BNP (last 3 results) No results for input(s): PROBNP in the last 8760 hours.  HbA1C: No results for input(s): HGBA1C in the last 72 hours.  CBG: No results for input(s): GLUCAP in the last 168 hours.  Lipid Profile: No results for input(s): CHOL, HDL, LDLCALC, TRIG, CHOLHDL, LDLDIRECT in the last 72 hours.  Thyroid Function Tests: No results for input(s): TSH, T4TOTAL, FREET4, T3FREE, THYROIDAB in the last 72 hours.  Anemia Panel: No results for input(s): VITAMINB12, FOLATE, FERRITIN, TIBC, IRON, RETICCTPCT in the last 72 hours.  Urine analysis:    Component Value Date/Time   COLORURINE YELLOW 12/22/2017 0742   APPEARANCEUR CLOUDY (A) 12/22/2017 0742   LABSPEC 1.011 12/22/2017 0742   PHURINE 9.0 (H) 12/22/2017 0742   GLUCOSEU NEGATIVE 12/22/2017 0742   HGBUR NEGATIVE 12/22/2017  0742   BILIRUBINUR NEGATIVE 12/22/2017 0742   KETONESUR NEGATIVE 12/22/2017 0742   PROTEINUR NEGATIVE 12/22/2017 0742   NITRITE NEGATIVE 12/22/2017 0742   LEUKOCYTESUR NEGATIVE 12/22/2017 0742    Sepsis Labs: Lactic Acid, Venous No results found for: LATICACIDVEN  MICROBIOLOGY: Recent Results (from the past 240 hour(s))  Surgical pcr screen     Status: Abnormal   Collection Time: 12/21/17 10:48 PM  Result Value Ref Range Status   MRSA, PCR NEGATIVE NEGATIVE Final   Staphylococcus aureus POSITIVE (A) NEGATIVE Final    Comment: (NOTE) The Xpert SA Assay (FDA approved for NASAL specimens in patients 82 years of age and older), is one component of a comprehensive surveillance program. It is not intended to diagnose infection nor to guide or monitor treatment. Performed at [facility], [address], [place], [place] [address]     RADIOLOGY STUDIES/RESULTS: Dg Chest 1 View  Result Date: 12/21/2017 CLINICAL DATA:  Patient fell today while walking in her house. Left hip and knee pain. EXAM: CHEST  1 VIEW COMPARISON:   Left shoulder radiographs 12/07/2017 FINDINGS: Redemonstration of subacute displaced overlapping fracture of the proximal left humerus involving the surgical neck. Osteoarthritis of the [facility] and both glenohumeral joints. Cardiomegaly with aortic atherosclerosis. No acute pulmonary consolidation or pneumothorax. No effusion. No acute displaced rib fracture. IMPRESSION: Cardiomegaly with aortic atherosclerosis. No active pulmonary disease. Known subacute left surgical neck fracture of the humerus with displaced humeral diaphysis as before. Electronically Signed   By: Tollie Eth M.D.   On: 12/21/2017 19:48   Pelvis Portable  Result Date: 12/22/2017 CLINICAL DATA:  Anterior approach left hip arthroplasty EXAM: PORTABLE PELVIS 1-2 VIEWS COMPARISON:  12/22/2017 intraoperative radiographs. FINDINGS: Postop change with soft tissue emphysema overlying the left hip is identified. Intact uncemented left total hip arthroplasty without complicating features or postoperative fracture. Degenerative disc disease and facet arthrosis of the included lower lumbar spine from L4 through S1. The bony pelvis appears intact with osteoarthritic change of the SI joints. Pubic symphysis is maintained. The native right hip joint is intact with minimal axial joint space narrowing noted. No fracture of the right hip. IMPRESSION: No immediate complications status post left total hip arthroplasty. Electronically Signed   By: Tollie Eth M.D.   On: 12/22/2017 17:43   Dg Shoulder Left  Result Date: 11/29/2017 CLINICAL DATA:  84 year old who tripped and fell on her porch this evening and injured the LEFT shoulder. Initial encounter. EXAM: LEFT SHOULDER - 2+ VIEW COMPARISON:  LEFT shoulder x-rays and MRI 04/12/2006. FINDINGS: Acute comminuted fracture involving the proximal LEFT humeral metaphysis. Avulsion fracture arising from the humeral head. Narrowed subacromial space. Degenerative changes involving the acromioclavicular joint.  Glenohumeral joint anatomically aligned with narrowing of the joint space. Generalized osseous demineralization. IMPRESSION: 1. Acute comminuted fracture involving the proximal LEFT humeral metaphysis. 2. Avulsion fracture arising from the RIGHT head. 3. Narrowed subacromial space indicating chronic supraspinatus tendon disease. 4. Osseous demineralization. 5. Osteoarthritis involving the glenohumeral joint and the AC joint. Electronically Signed   By: Hulan Saas M.D.   On: 11/29/2017 21:03   Dg Shoulder Left Port  Result Date: 12/22/2017 CLINICAL DATA:  Left shoulder fracture.  Follow-up. EXAM: LEFT SHOULDER - 1 VIEW COMPARISON:  11/29/2017 FINDINGS: Left humeral neck fracture again noted. There is anterior displacement on the Y-view with significant angulation. This has changed since prior study. No subluxation or dislocation. IMPRESSION: Increasing anterior displacement and angulation at the left humeral neck fracture. Electronically  Signed   By: Charlett Nose M.D.   On: 12/22/2017 09:19   Dg C-arm 1-60 Min  Result Date: 12/22/2017 CLINICAL DATA:  LEFT hip fracture. EXAM: DG C-ARM 61-120 MIN; OPERATIVE LEFT HIP WITH PELVIS COMPARISON:  Hip films 12/21/2017. FINDINGS: LEFT total hip arthroplasty.  Satisfactory position and alignment. IMPRESSION: No adverse features. Electronically Signed   By: Elsie Stain M.D.   On: 12/22/2017 15:48   Dg Hip Operative Unilat W Or W/o Pelvis Left  Result Date: 12/22/2017 CLINICAL DATA:  LEFT hip fracture. EXAM: DG C-ARM 61-120 MIN; OPERATIVE LEFT HIP WITH PELVIS COMPARISON:  Hip films 12/21/2017. FINDINGS: LEFT total hip arthroplasty.  Satisfactory position and alignment. IMPRESSION: No adverse features. Electronically Signed   By: Elsie Stain M.D.   On: 12/22/2017 15:48   Dg Hip Unilat With Pelvis 2-3 Views Left  Result Date: 12/21/2017 CLINICAL DATA:  Fall.  Left hip pain. EXAM: DG HIP (WITH OR WITHOUT PELVIS) 2-3V LEFT COMPARISON:  None. FINDINGS: Bones  are diffusely demineralized. Frontal pelvis shows unremarkable SI joints and symphysis pubis. Left femoral neck fracture in subcapsular to transcervical location shows varus angulation. No worrisome lytic or sclerotic osseous abnormality. IMPRESSION: Left femoral neck fracture with varus angulation. Electronically Signed   By: Kennith Center M.D.   On: 12/21/2017 19:47   Dg Femur Min 2 Views Left  Result Date: 12/21/2017 CLINICAL DATA:  Fall.  Left hip and knee pain. EXAM: LEFT FEMUR 2 VIEWS COMPARISON:  None. FINDINGS: Two views study reviewed along with the left hip exam. Two-view exam of the left femur shows a femoral neck fracture with varus angulation. No worrisome lytic or sclerotic osseous abnormality. Advanced degenerative changes are evident at the knee with probable intra-articular loose body. IMPRESSION: 1. Femoral neck fracture with varus angulation. 2. Marked degenerative changes in the knee with intra-articular loose body. Electronically Signed   By: Kennith Center M.D.   On: 12/21/2017 19:45   Xr Humerus Left  Result Date: 12/21/2017 2 views of the left proximal humerus show a 2 part proximal humerus fracture with slight interval healing.  Xr Shoulder 1v Left  Result Date: 12/07/2017 Single view of the left shoulder shows a 2 part proximal humerus fracture that is displaced.    LOS: 3 days   Jeoffrey Massed, MD  [facility]  If 7PM-7AM, please contact night-coverage  Please page via [email or web address]-Password [email or web address]-click on MD name and type text message  12/24/2017, 10:20 AM

## 2017-12-24 NOTE — Clinical Social Work Note (Signed)
Clinical Social Work Assessment  Patient Details  Name: Connie Snyder MRN: [id number] Date of Birth: [birth year]  Date of referral:  12/24/17               Reason for consult:  Facility Placement                Permission sought to share information with:  Family Supports Permission granted to share information::     Name::     Administrator, arts::     Relationship::  Daughter  Contact Information:     Housing/Transportation Living arrangements for the past 2 months:  Single Family Home Source of Information:  Adult Children Patient Interpreter Needed:  None Criminal Activity/Legal Involvement Pertinent to Current Situation/Hospitalization:  No - Comment as needed Significant Relationships:  Adult Children Lives with:  Adult Children(Brother) Do you feel safe going back to the place where you live?  No Need for family participation in patient care:  No (Coment)  Care giving concerns:  Pt is only alert to self. CSW spoke with pt's daughter via telephone.   Social Worker assessment / plan:  CSW spoke with pt's daughter, Ander Slade, via telephone. Pt lives with pt's son. Pt's daughter states her understanding is they are recommending CIR. CSW explained that SNF would be backup in the case that CIR could not take pt--pt's daughter understanding and appreciative. Pt's daughter does not know of any facilities and would appreciate a list-- CSW emailed pt's daughter a list of SNF to be looking over via email per pt's daughter's request. CSW will follow up with pt's daughter regarding choices.  Employment status:  Retired Database administrator PT Recommendations:  Inpatient Rehab Consult Information / Referral to community resources:  Skilled Nursing Facility  Patient/Family's Response to care:  Pt's daughter verbalized understanding of CSW role and expressed appreciation for support. Pt's daughter denies any concern regarding pt care at this time.  Patient/Family's Understanding  of and Emotional Response to Diagnosis, Current Treatment, and Prognosis:  Pt's daughter understanding and realistic regarding pt's physical limitations. Pt's daughter understands the need for pt to have a SNF backup in the case that CIR can not take pt.  Pt's daughter denies any concern regarding pt's treatment plan at this time. CSW will continue to provide support and facilitate d/c needs.   Emotional Assessment Appearance:  Appears stated age Attitude/Demeanor/Rapport:  Unable to Assess Affect (typically observed):  Unable to Assess Orientation:  Oriented to Self Alcohol / Substance use:  Not Applicable Psych involvement (Current and /or in the community):  No (Comment)  Discharge Needs  Concerns to be addressed:  Basic Needs, Care Coordination Readmission within the last 30 days:  No Current discharge risk:  Dependent with Mobility Barriers to Discharge:  Continued Medical Work up   [employer], LCSW 12/24/2017, 12:13 PM

## 2017-12-25 ENCOUNTER — Encounter (HOSPITAL_COMMUNITY): Payer: Self-pay | Admitting: Orthopaedic Surgery

## 2017-12-25 LAB — CBC
HCT: 21.7 % — ABNORMAL LOW (ref 36.0–46.0)
HCT: 28.2 % — ABNORMAL LOW (ref 36.0–46.0)
HEMOGLOBIN: 7.6 g/dL — AB (ref 12.0–15.0)
Hemoglobin: 9.9 g/dL — ABNORMAL LOW (ref 12.0–15.0)
MCH: 29.3 pg (ref 26.0–34.0)
MCH: 29.4 pg (ref 26.0–34.0)
MCHC: 35 g/dL (ref 30.0–36.0)
MCHC: 35.1 g/dL (ref 30.0–36.0)
MCV: 83.8 fL (ref 78.0–100.0)
MCV: 83.7 fL (ref 78.0–100.0)
PLATELETS: 258 10*3/uL (ref 150–400)
PLATELETS: 292 10*3/uL (ref 150–400)
RBC: 2.59 MIL/uL — AB (ref 3.87–5.11)
RBC: 3.37 MIL/uL — AB (ref 3.87–5.11)
RDW: 15.7 % — ABNORMAL HIGH (ref 11.5–15.5)
RDW: 15.4 % (ref 11.5–15.5)
WBC: 8.8 10*3/uL (ref 4.0–10.5)
WBC: 8.8 10*3/uL (ref 4.0–10.5)

## 2017-12-25 LAB — PREPARE RBC (CROSSMATCH)

## 2017-12-25 MED ORDER — DIPHENHYDRAMINE HCL 50 MG/ML IJ SOLN
25.0000 mg | Freq: Once | INTRAMUSCULAR | Status: AC
  Administered 2017-12-25: 25 mg via INTRAVENOUS
  Filled 2017-12-25: qty 1

## 2017-12-25 MED ORDER — ACETAMINOPHEN 325 MG PO TABS
650.0000 mg | ORAL_TABLET | Freq: Once | ORAL | Status: AC
  Administered 2017-12-25: 650 mg via ORAL
  Filled 2017-12-25: qty 2

## 2017-12-25 MED ORDER — FUROSEMIDE 10 MG/ML IJ SOLN
20.0000 mg | Freq: Once | INTRAMUSCULAR | Status: AC
  Administered 2017-12-25: 20 mg via INTRAVENOUS
  Filled 2017-12-25: qty 2

## 2017-12-25 MED ORDER — SODIUM CHLORIDE 0.9% IV SOLUTION
Freq: Once | INTRAVENOUS | Status: AC
  Administered 2017-12-25: 09:00:00 via INTRAVENOUS

## 2017-12-25 NOTE — Care Management Important Message (Signed)
Important Message  Patient Details  Name: DEVANSHI CALIFF MRN: [id number] Date of Birth: 1927-04-23   Medicare Important Message Given:  Yes    Dorena Bodo 12/25/2017, 2:53 PM

## 2017-12-25 NOTE — Progress Notes (Signed)
Physical Therapy Treatment Patient Details Name: Connie Snyder MRN: [id number] DOB: 1927/09/06 Today's Date: 12/25/2017    History of Present Illness  Pt is an 84 y.o. female with prior history of dementia, Sjogren's disease/arthritis, and L proximal humeral fx sustained in a fall approx 3 weeks ago. She was admitted following a second mechanical fall sustaining a left hip fracture. She underwent L THA 12-22-17.     PT Comments    Session limited today by lethargy from medication. Pt received pain meds as well as benadryl for blood transfusion. She performed LLE exercises in bed. She required mod assist supine to sit, min assist sit to stand, and mod assist SPT bed to recliner. At end of session, pt in recliner with feet elevated.    Follow Up Recommendations  CIR     Equipment Recommendations  Other (comment)(TBD)    Recommendations for Other Services       Precautions / Restrictions Precautions Precautions: Fall;Other (comment) Precaution Comments: Lt UE humeral fx  Required Braces or Orthoses: Sling Restrictions LUE Weight Bearing: Non weight bearing LLE Weight Bearing: Weight bearing as tolerated    Mobility  Bed Mobility Overal bed mobility: Needs Assistance Bed Mobility: Supine to Sit     Supine to sit: Mod assist;HOB elevated     General bed mobility comments: cues for sequencing and NWB LUE  Transfers Overall transfer level: Needs assistance Equipment used: 1 person hand held assist Transfers: Sit to/from [employer] Sit to Stand: Min assist Stand pivot transfers: Mod assist       General transfer comment: pivot tranfer toward right bed to recliner. Cues for sequencing. Therapist positioned in front of pt providing RUE support.  Ambulation/Gait             General Gait Details: unable due to lethargy from meds.   Stairs             Wheelchair Mobility    Modified Rankin (Stroke Patients Only)       Balance  Overall balance assessment: Needs assistance Sitting-balance support: No upper extremity supported;Feet supported Sitting balance-Leahy Scale: Good     Standing balance support: Single extremity supported;During functional activity Standing balance-Leahy Scale: Poor Standing balance comment: reliant on external support                            Cognition Arousal/Alertness: Lethargic;Suspect due to medications(benadryl given for blood transfusion) Behavior During Therapy: [facility] for tasks assessed/performed Overall Cognitive Status: History of cognitive impairments - at baseline                                 General Comments: h/o dementia      Exercises Total Joint Exercises Ankle Circles/Pumps: AROM;Both;10 reps Heel Slides: AAROM;Left;10 reps Hip ABduction/ADduction: AAROM;Left;10 reps Straight Leg Raises: AAROM;Left;10 reps    General Comments        Pertinent Vitals/Pain Pain Assessment: Faces Faces Pain Scale: Hurts little more Pain Location: L hip Pain Descriptors / Indicators: Sore;Grimacing;Operative site guarding Pain Intervention(s): Limited activity within patient's tolerance;Monitored during session;Repositioned    Home Living                      Prior Function            PT Goals (current goals can now be found in the care plan section) Acute Rehab  PT Goals Patient Stated Goal: home PT Goal Formulation: With patient/family Time For Goal Achievement: 01/06/18 Potential to Achieve Goals: Good Progress towards PT goals: Progressing toward goals    Frequency    Min 3X/week      PT Plan Current plan remains appropriate    Co-evaluation              AM-PAC PT "6 Clicks" Daily Activity  Outcome Measure  Difficulty turning over in bed (including adjusting bedclothes, sheets and blankets)?: A Lot Difficulty moving from lying on back to sitting on the side of the bed? : Unable Difficulty sitting down on and  standing up from a chair with arms (e.g., wheelchair, bedside commode, etc,.)?: Unable Help needed moving to and from a bed to chair (including a wheelchair)?: A Little Help needed walking in hospital room?: A Lot Help needed climbing 3-5 steps with a railing? : A Lot 6 Click Score: 11    End of Session Equipment Utilized During Treatment: Gait belt;Other (comment)(LUE sling) Activity Tolerance: Patient tolerated treatment well Patient left: in chair;with call bell/phone within reach;with chair alarm set Nurse Communication: Mobility status PT Visit Diagnosis: Other abnormalities of gait and mobility (R26.89);Repeated falls (R29.6);Pain Pain - Right/Left: Left Pain - part of body: Hip     Time: 6269-4854 PT Time Calculation (min) (ACUTE ONLY): 25 min  Charges:  $Therapeutic Exercise: 8-22 mins $Therapeutic Activity: 8-22 mins                     Aida Raider, PT  Office # [phone] Pager [phone]    Ilda Foil 12/25/2017, 1:24 PM

## 2017-12-25 NOTE — Progress Notes (Signed)
PT Cancellation Note  Patient Details Name: Connie Snyder MRN: [id number] DOB: November 06, 1927   Cancelled Treatment:    Reason Eval/Treat Not Completed: Medical issues which prohibited therapy. Pt receiving blood. PT to re-attempt as time allows.   Ilda Foil 12/25/2017, 10:51 AM   Aida Raider, PT  Office # [phone] Pager [phone]

## 2017-12-25 NOTE — Plan of Care (Signed)
  Problem: Education: Goal: Knowledge of General Education information will improve Description Including pain rating scale, medication(s)/side effects and non-pharmacologic comfort measures Outcome: Progressing   Problem: Health Behavior/Discharge Planning: Goal: Ability to manage health-related needs will improve Outcome: Progressing   Problem: Clinical Measurements: Goal: Ability to maintain clinical measurements within normal limits will improve Outcome: Progressing Goal: Will remain free from infection Outcome: Progressing   Problem: Activity: Goal: Risk for activity intolerance will decrease Outcome: Progressing   Problem: Nutrition: Goal: Adequate nutrition will be maintained Outcome: Progressing   Problem: Elimination: Goal: Will not experience complications related to bowel motility Outcome: Progressing Goal: Will not experience complications related to urinary retention Outcome: Progressing

## 2017-12-25 NOTE — Progress Notes (Signed)
Inpatient Rehabilitation-Admissions Coordinator    Met with patient and her son at the bedside to discuss team's recommendation for inpatient rehabilitation. Shared booklets, expectations while in CIR, expected length of stay, and anticipated functional level at DC.  Both pt and son interested in rehab at this time. AC will initiate insurance authorization and will follow up once decision has been made.   Jhonnie Garner, OTR/L  Rehab Admissions Coordinator  [phone] 12/25/2017 12:24 PM

## 2017-12-25 NOTE — Evaluation (Signed)
Clinical/Bedside Swallow Evaluation Patient Details  Name: Connie Snyder MRN: [id number] Date of Birth: 05-16-1927  Today's Date: 12/25/2017 Time: SLP Start Time (ACUTE ONLY): 1355 SLP Stop Time (ACUTE ONLY): 1422 SLP Time Calculation (min) (ACUTE ONLY): 27 min  Past Medical History:  Past Medical History:  Diagnosis Date  . Arthritis   . Sjogren's disease [facility])    Past Surgical History:  Past Surgical History:  Procedure Laterality Date  . TOTAL HIP ARTHROPLASTY Left 12/22/2017   Procedure: TOTAL HIP ARTHROPLASTY ANTERIOR APPROACH;  Surgeon: Tarry Kos, MD;  Location: [facility] OR;  Service: Orthopedics;  Laterality: Left;   HPI:  Pt is an 84 y.o. female with prior history of dementia, Sjogren's disease/arthritis and anemia. Per chart, she was admitted to ED on 9/19 with left hip pain secondary to a fall at home (no LOC as witnessed by son). Pt was Intubated and extubated for left total hip anthroplasty 9/20 and per MD, reports pain when swallowing. CXR revealed cardiomegaly with aortic atherosclerosis.   Assessment / Plan / Recommendation Clinical Impression    Pt was alert and cooperative throughout evaluation with regular lunch tray today. She denied history of pneumonia, dysphagia, or pain during swallowing, however pt did report decreased appetite and difficulty masticating regular texture PO's. Son at bedside expressed concern for lack of PO intake and suspected pain during swallowing given recent intubation during her surgery. Oral motor exam revealed facial symmetry, lingual and labial strength WFL, ROM mildly impaired. Volitional cough was strong, and given palpation during volitional swallow, suspect decreased hyolaryngeal elevation. Although pt did not exhibit any overt s/s aspiration during consumption of puree, thin, and regular texture PO's, prolonged and ineffective mastication observed with regular texture. She expectorated regular PO's that she could not adequately masticate  and son reported she has been without dentures for approx 2 years. Given oral phase impairments and endentulous status, recommend downgrade to dysphagia 2 (minced/ground), continue thin liquids, take small bites/sips and minimize environmental distractions. ST will follow up to provide treatment with diet safety and efficiency.   SLP Visit Diagnosis: Dysphagia, oral phase (R13.11)    Aspiration Risk  Mild aspiration risk    Diet Recommendation Dysphagia 2 (Fine chop);Thin liquid   Liquid Administration via: Cup;Straw Medication Administration: Whole meds with liquid Supervision: Patient able to self feed;Intermittent supervision to cue for compensatory strategies Compensations: Minimize environmental distractions;Slow rate;Small sips/bites Postural Changes: Seated upright at 90 degrees    Other  Recommendations Oral Care Recommendations: Oral care BID   Follow up Recommendations (TBD)      Frequency and Duration min 1 x/week  2 weeks       Prognosis Prognosis for Safe Diet Advancement: Good Barriers to Reach Goals: Cognitive deficits      Swallow Study   General HPI: Pt is an 84 y.o. female with prior history of dementia, Sjogren's disease/arthritis and anemia. Per chart, she was admitted to ED on 9/19 with left hip pain secondary to a fall at home (no LOC as witnessed by son). Pt was Intubated and extubated for left total hip anthroplasty 9/20 and per MD, reports pain when swallowing. CXR revealed cardiomegaly with aortic atherosclerosis. Type of Study: Bedside Swallow Evaluation Diet Prior to this Study: Regular;Thin liquids Temperature Spikes Noted: No Respiratory Status: Room air History of Recent Intubation: Yes Length of Intubations (days): 1 days(during surgery) Date extubated: 12/22/17 Behavior/Cognition: Alert;Cooperative;Pleasant mood;Distractible;Requires cueing Oral Cavity Assessment: Within Functional Limits Oral Care Completed by SLP: No Oral Cavity -  Dentition: Edentulous(no dentures (did not replace after lost 2nd pair)) Vision: Functional for self-feeding Self-Feeding Abilities: Able to feed self Patient Positioning: Upright in chair Baseline Vocal Quality: Normal Volitional Cough: Strong Volitional Swallow: Able to elicit    Oral/Motor/Sensory Function Overall Oral Motor/Sensory Function: Within functional limits   Ice Chips Ice chips: Not tested   Thin Liquid Thin Liquid: Within functional limits Presentation: Cup;Straw    Nectar Thick Nectar Thick Liquid: Not tested   Honey Thick Honey Thick Liquid: Not tested   Puree Puree: Within functional limits Presentation: Spoon;Self Fed   Solid     Solid: Impaired Oral Phase Impairments: Impaired mastication Oral Phase Functional Implications: Prolonged oral transit;Impaired mastication Pharyngeal Phase Impairments: (none)     Suzzette Righter, Student SLP  Suzzette Righter 12/25/2017,3:33 PM

## 2017-12-25 NOTE — Anesthesia Postprocedure Evaluation (Signed)
Anesthesia Post Note  Patient: Connie Snyder  Procedure(s) Performed: TOTAL HIP ARTHROPLASTY ANTERIOR APPROACH (Left Hip)     Patient location during evaluation: PACU Anesthesia Type: General Level of consciousness: awake and alert Pain management: pain level controlled Vital Signs Assessment: post-procedure vital signs reviewed and stable Respiratory status: spontaneous breathing, nonlabored ventilation, respiratory function stable and patient connected to nasal cannula oxygen Cardiovascular status: blood pressure returned to baseline and stable Postop Assessment: no apparent nausea or vomiting Anesthetic complications: no    Last Vitals:  Vitals:   12/25/17 0822 12/25/17 0847  BP: 106/65 101/66  Pulse: 76 82  Resp: 12 12  Temp: 37.3 C 37.1 C  SpO2: 100% 100%    Last Pain:  Vitals:   12/25/17 0847  TempSrc: Oral  PainSc:                  Trevor Iha

## 2017-12-25 NOTE — Progress Notes (Signed)
Subjective: 3 Days Post-Op Procedure(s) (LRB): TOTAL HIP ARTHROPLASTY ANTERIOR APPROACH (Left) Patient reports pain as mild.  Patient resting comfortably.  Very minimal pain  Objective: Vital signs in last 24 hours: Temp:  [97.6 F (36.4 C)-98.5 F (36.9 C)] 97.6 F (36.4 C) (09/23 0611) Pulse Rate:  [81-97] 81 (09/23 0611) Resp:  [14-16] 15 (09/23 0611) BP: (99-124)/(56-70) 124/70 (09/23 0611) SpO2:  [99 %-100 %] 100 % (09/23 0611)  Intake/Output from previous day: 09/22 0701 - 09/23 0700 In: 357 [P.O.:357] Out: 1000 [Urine:1000] Intake/Output this shift: No intake/output data recorded.  Recent Labs    12/22/17 1839 12/23/17 0408 12/24/17 0707 12/25/17 0402  HGB 10.4* 8.9* 8.5* 7.6*   Recent Labs    12/24/17 0707 12/25/17 0402  WBC 11.4* 8.8  RBC 2.91* 2.59*  HCT 24.0* 21.7*  PLT 284 258   Recent Labs    12/23/17 0408 12/24/17 0707  NA 135 136  K 3.9 3.9  CL 102 101  CO2 22 26  BUN 12 15  CREATININE 0.61 0.54  GLUCOSE 184* 103*  CALCIUM 8.3* 8.1*   No results for input(s): LABPT, INR in the last 72 hours.  Neurologically intact Neurovascular intact Sensation intact distally Intact pulses distally Dorsiflexion/Plantar flexion intact Incision: dressing C/D/I No cellulitis present Compartment soft    Assessment/Plan: 3 Days Post-Op Procedure(s) (LRB): TOTAL HIP ARTHROPLASTY ANTERIOR APPROACH (Left) Up with therapy  WBAT LLE- no precautions ABLA- trending down- consider transfusion but will defer to medicine D/c dispo per medicine F/u with Dr. Roda Shutters 2 weeks post-op for wound check and suture removal    Cristie Hem 12/25/2017, 7:20 AM

## 2017-12-25 NOTE — Progress Notes (Signed)
PROGRESS NOTE        PATIENT DETAILS Name: Connie Snyder Age: 84 y.o. Sex: female Date of Birth: Sep 05, 1927 Admit Date: 12/21/2017 Admitting Physician Ejiroghene Wendall Stade, MD RFF:[id number], No Pcp Per  Brief Narrative: Patient is a 84 y.o. female with prior history of dementia, Sjogren's disease/arthritis on methotrexate admitted following a mechanical fall found to have a left hip fracture.  See below for further details.  Subjective: No major issues overnight-son continues to claim that she is not able to swallow well-primarily due to pain.  Assessment/Plan: Left hip fracture: Following a mechanical fall-underwent left hip replacement on 9/20.  Orthopedics following-AA following a mechanical fall, WBAT LLE.  Continue Lovenox for prophylaxis.  Awaiting CIR placement.    Anemia: Secondary to perioperative blood loss anemia-hemoglobin continues to downtrend-we will transfuse 1 unit of PRBC today.  Follow CBC.  Recent history of left proximal humerus fracture: Occurred approximately 3 weeks prior to this hospital stay, followed by Dr. Roe Rutherford NWB to LUE  Dementia: Pleasantly confused-suspect close to usual baseline.  Continue Namenda and Aricept.    Left bundle branch block: Probably chronic-no prior EKGs to compare with.  She has tolerated surgery well.  She apparently is highly functional-unable to play with her grandson.  She has no prior history of CAD.   Hypertension: BP controlled-continue to hold all antihypertensives.  Resume when able.  Sjogren's disease/arthritis: Continue to hold methotrexate.  ?  Odynophagia/dysphagia: Obtain speech therapy evaluation-if found to have objective issues-we can consider further work-up.  DVT Prophylaxis: Prophylactic Lovenox.  Code Status: Full code  Family Communication: None at bedside  Disposition Plan: Remain inpatient-CIR when bed available.  Antimicrobial agents: Anti-infectives (From  admission, onward)   Start     Dose/Rate Route Frequency Ordered Stop   12/23/17 0600  ceFAZolin (ANCEF) IVPB 2g/100 mL premix     2 g 200 mL/hr over 30 Minutes Intravenous On call to O.R. 12/22/17 1316 12/22/17 1447   12/22/17 2100  ceFAZolin (ANCEF) IVPB 2g/100 mL premix     2 g 200 mL/hr over 30 Minutes Intravenous Every 6 hours 12/22/17 1813 12/23/17 0918   12/22/17 1512  vancomycin (VANCOCIN) powder  Status:  Discontinued       As needed 12/22/17 1512 12/22/17 1613   12/22/17 1321  ceFAZolin (ANCEF) 2-4 GM/100ML-% IVPB    Note to Pharmacy:  Aquilla Hacker   : cabinet override      12/22/17 1321 12/22/17 1417     Procedures: None  CONSULTS:  orthopedic surgery  Time spent: 25- minutes-Greater than 50% of this time was spent in counseling, explanation of diagnosis, planning of further management, and coordination of care.  MEDICATIONS: Scheduled Meds: . Chlorhexidine Gluconate Cloth  6 each Topical Daily  . docusate sodium  100 mg Oral BID  . donepezil  5 mg Oral QHS  . enoxaparin (LOVENOX) injection  40 mg Subcutaneous Q24H  . memantine  5 mg Oral Daily  . mirtazapine  30 mg Oral QHS  . mupirocin ointment  1 application Nasal BID   Continuous Infusions: . sodium chloride 75 mL/hr at 12/22/17 2013  . methocarbamol (ROBAXIN) IV     PRN Meds:.acetaminophen, alum & mag hydroxide-simeth, HYDROcodone-acetaminophen, HYDROcodone-acetaminophen, magic mouthwash w/lidocaine, magnesium citrate, menthol-cetylpyridinium **OR** phenol, methocarbamol **OR** methocarbamol (ROBAXIN) IV, morphine injection, ondansetron **OR** ondansetron (ZOFRAN) IV, polyethylene glycol, senna-docusate, sorbitol  PHYSICAL EXAM: Vital signs: Vitals:   12/25/17 0611 12/25/17 0822 12/25/17 0847 12/25/17 1124  BP: 124/70 106/65 101/66 107/67  Pulse: 81 76 82 87  Resp: 15 12 12 14   Temp: 97.6 F (36.4 C) 99.1 F (37.3 C) 98.7 F (37.1 C) 98.5 F (36.9 C)  TempSrc: Axillary Oral Oral Oral  SpO2: 100%  100% 100% 99%  Weight:      Height:       Filed Weights   12/21/17 1757 12/22/17 0415  Weight: 81.6 kg 81.6 kg   Body mass index is 29.05 kg/m.   General appearance:Awake, pleasantly confused Eyes:no scleral icterus. HEENT: Atraumatic and Normocephalic Neck: supple, no JVD. Resp:Good air entry bilaterally,no rales or rhonchi CVS: S1 S2 regular, no murmurs.  GI: Bowel sounds present, Non tender and not distended with no gaurding, rigidity or rebound. Extremities: B/L Lower Ext shows no edema, both legs are warm to touch Neurology:  Non focal Psychiatric: Normal judgment and insight. Normal mood. Musculoskeletal:No digital cyanosis Skin:No Rash, warm and dry Wounds:N/A  I have personally reviewed following labs and imaging studies  LABORATORY DATA: CBC: Recent Labs  Lab 12/21/17 1838 12/22/17 0335 12/22/17 1839 12/23/17 0408 12/24/17 0707 12/25/17 0402  WBC 7.8 10.9* 19.3* 12.6* 11.4* 8.8  NEUTROABS 6.2  --   --   --   --   --   HGB 10.7* 10.1* 10.4* 8.9* 8.5* 7.6*  HCT 30.1* 28.5* 30.4* 25.6* 24.0* 21.7*  MCV 82.7 83.8 84.4 84.2 82.5 83.8  PLT 416* 345 372 297 284 258    Basic Metabolic Panel: Recent Labs  Lab 12/21/17 1838 12/22/17 0335 12/22/17 1839 12/23/17 0408 12/24/17 0707  NA 141 138  --  135 136  K 4.0 4.1  --  3.9 3.9  CL 104 105  --  102 101  CO2 28 26  --  22 26  GLUCOSE 97 107*  --  184* 103*  BUN 11 7*  --  12 15  CREATININE 0.48 0.52 0.54 0.61 0.54  CALCIUM 8.8* 8.6*  --  8.3* 8.1*    GFR: Estimated Creatinine Clearance: 53.3 mL/min (by C-G formula based on SCr of 0.54 mg/dL).  Liver Function Tests: No results for input(s): AST, ALT, ALKPHOS, BILITOT, PROT, ALBUMIN in the last 168 hours. No results for input(s): LIPASE, AMYLASE in the last 168 hours. No results for input(s): AMMONIA in the last 168 hours.  Coagulation Profile: No results for input(s): INR, PROTIME in the last 168 hours.  Cardiac Enzymes: Recent Labs  Lab  12/22/17 0639  TROPONINI <0.03    BNP (last 3 results) No results for input(s): PROBNP in the last 8760 hours.  HbA1C: No results for input(s): HGBA1C in the last 72 hours.  CBG: No results for input(s): GLUCAP in the last 168 hours.  Lipid Profile: No results for input(s): CHOL, HDL, LDLCALC, TRIG, CHOLHDL, LDLDIRECT in the last 72 hours.  Thyroid Function Tests: No results for input(s): TSH, T4TOTAL, FREET4, T3FREE, THYROIDAB in the last 72 hours.  Anemia Panel: No results for input(s): VITAMINB12, FOLATE, FERRITIN, TIBC, IRON, RETICCTPCT in the last 72 hours.  Urine analysis:    Component Value Date/Time   COLORURINE YELLOW 12/22/2017 0742   APPEARANCEUR CLOUDY (A) 12/22/2017 0742   LABSPEC 1.011 12/22/2017 0742   PHURINE 9.0 (H) 12/22/2017 0742   GLUCOSEU NEGATIVE 12/22/2017 0742   HGBUR NEGATIVE 12/22/2017 0742   BILIRUBINUR NEGATIVE 12/22/2017 0742   KETONESUR NEGATIVE 12/22/2017 0742   PROTEINUR NEGATIVE  12/22/2017 0742   NITRITE NEGATIVE 12/22/2017 0742   LEUKOCYTESUR NEGATIVE 12/22/2017 0742    Sepsis Labs: Lactic Acid, Venous No results found for: LATICACIDVEN  MICROBIOLOGY: Recent Results (from the past 240 hour(s))  Surgical pcr screen     Status: Abnormal   Collection Time: 12/21/17 10:48 PM  Result Value Ref Range Status   MRSA, PCR NEGATIVE NEGATIVE Final   Staphylococcus aureus POSITIVE (A) NEGATIVE Final    Comment: (NOTE) The Xpert SA Assay (FDA approved for NASAL specimens in patients 46 years of age and older), is one component of a comprehensive surveillance program. It is not intended to diagnose infection nor to guide or monitor treatment. Performed at [facility], [address] [place], [place] [address]     RADIOLOGY STUDIES/RESULTS: Dg Chest 1 View  Result Date: 12/21/2017 CLINICAL DATA:  Patient fell today while walking in her house. Left hip and knee pain. EXAM: CHEST  1 VIEW COMPARISON:  Left shoulder radiographs  12/07/2017 FINDINGS: Redemonstration of subacute displaced overlapping fracture of the proximal left humerus involving the surgical neck. Osteoarthritis of the [facility] and both glenohumeral joints. Cardiomegaly with aortic atherosclerosis. No acute pulmonary consolidation or pneumothorax. No effusion. No acute displaced rib fracture. IMPRESSION: Cardiomegaly with aortic atherosclerosis. No active pulmonary disease. Known subacute left surgical neck fracture of the humerus with displaced humeral diaphysis as before. Electronically Signed   By: Tollie Eth M.D.   On: 12/21/2017 19:48   Pelvis Portable  Result Date: 12/22/2017 CLINICAL DATA:  Anterior approach left hip arthroplasty EXAM: PORTABLE PELVIS 1-2 VIEWS COMPARISON:  12/22/2017 intraoperative radiographs. FINDINGS: Postop change with soft tissue emphysema overlying the left hip is identified. Intact uncemented left total hip arthroplasty without complicating features or postoperative fracture. Degenerative disc disease and facet arthrosis of the included lower lumbar spine from L4 through S1. The bony pelvis appears intact with osteoarthritic change of the SI joints. Pubic symphysis is maintained. The native right hip joint is intact with minimal axial joint space narrowing noted. No fracture of the right hip. IMPRESSION: No immediate complications status post left total hip arthroplasty. Electronically Signed   By: Tollie Eth M.D.   On: 12/22/2017 17:43   Dg Shoulder Left  Result Date: 11/29/2017 CLINICAL DATA:  84 year old who tripped and fell on her porch this evening and injured the LEFT shoulder. Initial encounter. EXAM: LEFT SHOULDER - 2+ VIEW COMPARISON:  LEFT shoulder x-rays and MRI 04/12/2006. FINDINGS: Acute comminuted fracture involving the proximal LEFT humeral metaphysis. Avulsion fracture arising from the humeral head. Narrowed subacromial space. Degenerative changes involving the acromioclavicular joint. Glenohumeral joint anatomically  aligned with narrowing of the joint space. Generalized osseous demineralization. IMPRESSION: 1. Acute comminuted fracture involving the proximal LEFT humeral metaphysis. 2. Avulsion fracture arising from the RIGHT head. 3. Narrowed subacromial space indicating chronic supraspinatus tendon disease. 4. Osseous demineralization. 5. Osteoarthritis involving the glenohumeral joint and the AC joint. Electronically Signed   By: Hulan Saas M.D.   On: 11/29/2017 21:03   Dg Shoulder Left Port  Result Date: 12/22/2017 CLINICAL DATA:  Left shoulder fracture.  Follow-up. EXAM: LEFT SHOULDER - 1 VIEW COMPARISON:  11/29/2017 FINDINGS: Left humeral neck fracture again noted. There is anterior displacement on the Y-view with significant angulation. This has changed since prior study. No subluxation or dislocation. IMPRESSION: Increasing anterior displacement and angulation at the left humeral neck fracture. Electronically Signed   By: Charlett Nose M.D.   On: 12/22/2017 09:19   Dg C-arm  1-60 Min  Result Date: 12/22/2017 CLINICAL DATA:  LEFT hip fracture. EXAM: DG C-ARM 61-120 MIN; OPERATIVE LEFT HIP WITH PELVIS COMPARISON:  Hip films 12/21/2017. FINDINGS: LEFT total hip arthroplasty.  Satisfactory position and alignment. IMPRESSION: No adverse features. Electronically Signed   By: Elsie Stain M.D.   On: 12/22/2017 15:48   Dg Hip Operative Unilat W Or W/o Pelvis Left  Result Date: 12/22/2017 CLINICAL DATA:  LEFT hip fracture. EXAM: DG C-ARM 61-120 MIN; OPERATIVE LEFT HIP WITH PELVIS COMPARISON:  Hip films 12/21/2017. FINDINGS: LEFT total hip arthroplasty.  Satisfactory position and alignment. IMPRESSION: No adverse features. Electronically Signed   By: Elsie Stain M.D.   On: 12/22/2017 15:48   Dg Hip Unilat With Pelvis 2-3 Views Left  Result Date: 12/21/2017 CLINICAL DATA:  Fall.  Left hip pain. EXAM: DG HIP (WITH OR WITHOUT PELVIS) 2-3V LEFT COMPARISON:  None. FINDINGS: Bones are diffusely demineralized.  Frontal pelvis shows unremarkable SI joints and symphysis pubis. Left femoral neck fracture in subcapsular to transcervical location shows varus angulation. No worrisome lytic or sclerotic osseous abnormality. IMPRESSION: Left femoral neck fracture with varus angulation. Electronically Signed   By: Kennith Center M.D.   On: 12/21/2017 19:47   Dg Femur Min 2 Views Left  Result Date: 12/21/2017 CLINICAL DATA:  Fall.  Left hip and knee pain. EXAM: LEFT FEMUR 2 VIEWS COMPARISON:  None. FINDINGS: Two views study reviewed along with the left hip exam. Two-view exam of the left femur shows a femoral neck fracture with varus angulation. No worrisome lytic or sclerotic osseous abnormality. Advanced degenerative changes are evident at the knee with probable intra-articular loose body. IMPRESSION: 1. Femoral neck fracture with varus angulation. 2. Marked degenerative changes in the knee with intra-articular loose body. Electronically Signed   By: Kennith Center M.D.   On: 12/21/2017 19:45   Xr Humerus Left  Result Date: 12/21/2017 2 views of the left proximal humerus show a 2 part proximal humerus fracture with slight interval healing.  Xr Shoulder 1v Left  Result Date: 12/07/2017 Single view of the left shoulder shows a 2 part proximal humerus fracture that is displaced.    LOS: 4 days   Jeoffrey Massed, MD  [facility]  If 7PM-7AM, please contact night-coverage  Please page via [email or web address]-Password [email or web address]-click on MD name and type text message  12/25/2017, 12:26 PM

## 2017-12-26 LAB — TYPE AND SCREEN
ABO/RH(D): B POS
ANTIBODY SCREEN: NEGATIVE
UNIT DIVISION: 0

## 2017-12-26 LAB — BPAM RBC
Blood Product Expiration Date: 201910012359
ISSUE DATE / TIME: 201909230820
UNIT TYPE AND RH: 7300

## 2017-12-26 MED ORDER — ENSURE ENLIVE PO LIQD
237.0000 mL | Freq: Two times a day (BID) | ORAL | Status: DC
  Administered 2017-12-26 – 2017-12-28 (×5): 237 mL via ORAL

## 2017-12-26 MED ORDER — WHITE PETROLATUM EX OINT
TOPICAL_OINTMENT | CUTANEOUS | Status: AC
  Administered 2017-12-27: 04:00:00
  Filled 2017-12-26: qty 28.35

## 2017-12-26 NOTE — Progress Notes (Signed)
CSW notes CIR involvement. Awaiting insurance auth.   CSW singing off at this time.   [place], [place] [phone]

## 2017-12-26 NOTE — Progress Notes (Signed)
Nutrition Follow-up  DOCUMENTATION CODES:   Non-severe (moderate) malnutrition in context of acute illness/injury  INTERVENTION:    Chocolate Ensure Enlive po BID, each supplement provides 350 kcal and 20 grams of protein  NUTRITION DIAGNOSIS:   Moderate Malnutrition related to acute illness, decreased appetite(2 falls resulting in fractures in 3 weeks) as evidenced by energy intake < 75% for > 7 days, mild fat depletion, mild muscle depletion.  Ongoing  GOAL:   Patient will meet greater than or equal to 90% of their needs  Unmet  MONITOR:   Diet advancement, PO intake, Supplement acceptance  ASSESSMENT:   Pt admitted w/ left femoral neck fracture s/p fall on 9/19. Previous fall 3 weeks ago causing left proximal humerus fracture. Scheduled for surgery at 1:30 today.   Patient speaking with RN during RD visit. Spoke with Runner, broadcasting/film/video, who reports patient consumed 50% of breakfast today. Patient would benefit from PO supplements to maximize protein and energy intake.   Labs and medications reviewed.   I/O -2.5 L since admission  Diet Order:   Diet Order            DIET DYS 2 Room service appropriate? Yes; Fluid consistency: Thin  Diet effective now              EDUCATION NEEDS:   Not appropriate for education at this time  Skin:  Skin Assessment: Reviewed RN Assessment(eczema, dry)  Last BM:  9/21 (type 6)  Height:   Ht Readings from Last 1 Encounters:  12/22/17 5' 5.98" (1.676 m)    Weight:   Wt Readings from Last 1 Encounters:  12/22/17 81.6 kg    Ideal Body Weight:  59 kg  BMI:  Body mass index is 29.05 kg/m.  Estimated Nutritional Needs:   Kcal:  1650-1850  Protein:  90-100 gm  Fluid:  1.8 L   Joaquin Courts, RD, LDN, CNSC Pager [phone] After Hours Pager [phone]

## 2017-12-26 NOTE — Progress Notes (Signed)
Physical Therapy Treatment Patient Details Name: Connie Snyder MRN: [id number] DOB: [birth year] Today's Date: 12/26/2017    History of Present Illness  Pt is an 84 y.o. female with prior history of dementia, Sjogren's disease/arthritis, and L proximal humeral fx sustained in a fall approx 3 weeks ago. She was admitted following a second mechanical fall sustaining a left hip fracture. She underwent L THA 12-22-17.     PT Comments    Pt asleep upon entry, Son reports pt has altered sleeping habits at baseline usually awake from noon to midnight. Pt easily awakened and agreeable to participate. Pt has strong short term memory deficits and is oriented to both surgical leg and subacute Lt humeral fracture >4x each in session. Pt requires modA to rise from chair, but minA when surface is elevated. Tactile and verbal cues provided for safety. Pt performs 6 transfers with rest between, becomes "woozie" after the last of each set of three. Vitals established and WNL. Session ended and RN notified.     Follow Up Recommendations  CIR     Equipment Recommendations  (hemiwalker)    Recommendations for Other Services Rehab consult     Precautions / Restrictions Precautions Precautions: Fall;Other (comment) Precaution Comments: Lt UE humeral fx (3-4WA)  Required Braces or Orthoses: Sling Restrictions LUE Weight Bearing: Non weight bearing LLE Weight Bearing: Weight bearing as tolerated    Mobility  Bed Mobility               General bed mobility comments: received up in chair   Transfers Overall transfer level: Needs assistance Equipment used: Hemi-walker Transfers: Sit to/from Stand Sit to Stand: Mod assist;From elevated surface         General transfer comment: support given as Rt ischial tuberosity and sternum for cuing and safety, good transisiton to [facility] once standing. Pt reports feelign dizzy two times, vitals established as WNL. RN notified   Ambulation/Gait                  Stairs             Wheelchair Mobility    Modified Rankin (Stroke Patients Only)       Balance                                            Cognition Arousal/Alertness: Awake/alert Behavior During Therapy: WFL for tasks assessed/performed Overall Cognitive Status: History of cognitive impairments - at baseline                                 General Comments: h/o dementia      Exercises Total Joint Exercises Long Arc Quad: AROM;10 reps;Both    General Comments        Pertinent Vitals/Pain Pain Assessment: No/denies pain(mild discomfort)    Home Living                      Prior Function            PT Goals (current goals can now be found in the care plan section) Acute Rehab PT Goals Patient Stated Goal: home PT Goal Formulation: With patient/family Time For Goal Achievement: 01/06/18 Potential to Achieve Goals: Good Progress towards PT goals: Progressing toward goals    Frequency    Min  3X/week      PT Plan Current plan remains appropriate    Co-evaluation              AM-PAC PT "6 Clicks" Daily Activity  Outcome Measure  Difficulty turning over in bed (including adjusting bedclothes, sheets and blankets)?: A Lot Difficulty moving from lying on back to sitting on the side of the bed? : Unable Difficulty sitting down on and standing up from a chair with arms (e.g., wheelchair, bedside commode, etc,.)?: Unable Help needed moving to and from a bed to chair (including a wheelchair)?: A Little Help needed walking in hospital room?: A Lot Help needed climbing 3-5 steps with a railing? : A Lot 6 Click Score: 11    End of Session Equipment Utilized During Treatment: Gait belt;Other (comment) Activity Tolerance: Patient tolerated treatment well Patient left: in chair;with call bell/phone within reach;with chair alarm set Nurse Communication: Mobility status PT Visit Diagnosis: Other  abnormalities of gait and mobility (R26.89);Repeated falls (R29.6);Pain Pain - Right/Left: Left Pain - part of body: Hip     Time: 1204-1237 PT Time Calculation (min) (ACUTE ONLY): 33 min  Charges:  $Therapeutic Exercise: 8-22 mins $Therapeutic Activity: 8-22 mins                     1:09 PM, 12/26/17 Rosamaria Lints, PT, DPT Physical Therapist - [place] [phone] (Pager)  [phone] (Office)      Pa Tennant C 12/26/2017, 1:05 PM

## 2017-12-26 NOTE — Progress Notes (Signed)
Inpatient Rehabilitation-Admissions Coordinator   Informed by pt's insurance that she has been denied request for CIR. They did offer a peer to peer option, which Dr. Jerral Ralph has agreed to complete.   Noted that the pt will be assigned a new attending tomorrow due to schedule changes and if peer to peer not completed today, [facility] will discuss with new provider if they are willing to complete peer to peer if needed.   Will follow up once final insurance decision has been made.   Nanine Means, OTR/L  Rehab Admissions Coordinator  [phone] 12/26/2017 4:37 PM

## 2017-12-26 NOTE — Progress Notes (Signed)
PROGRESS NOTE        PATIENT DETAILS Name: Connie Snyder Age: 84 y.o. Sex: female Date of Birth: 1928-01-04 Admit Date: 12/21/2017 Admitting Physician Ejiroghene Wendall Stade, MD TKP:[id number], No Pcp Per  Brief Narrative: Patient is a 84 y.o. female with prior history of dementia, Sjogren's disease/arthritis on methotrexate admitted following a mechanical fall found to have a left hip fracture.  See below for further details.  Subjective: Remains pleasantly confused-no major issues overnight.  Denies any chest pain or shortness of breath.  Assessment/Plan: Left hip fracture: Following a mechanical fall-underwent left hip replacement on 9/20.orthopedics following-WBAT to LLE, continue Lovenox for prophylaxis.  Awaiting CIR placement.    Anemia: Secondary to perioperative blood loss-hemoglobin stable-transfuse 1 unit of PRBC so far.  Continue to follow CBC periodically.  Recent history of left proximal humerus fracture: Occurred approximately 3 weeks prior to this hospital stay, followed by Dr. Roe Rutherford [facility] to LUE  Dementia: Remains pleasantly confused-she is close to usual baseline-continue Namenda and Aricept.      Left bundle branch block: Probably chronic-no prior EKGs to compare with.  She has tolerated surgery well.  She apparently is highly functional-unable to play with her grandson.  She has no prior history of CAD.   Hypertension: Controlled without the use of any antihypertensives-resume when able.    Sjogren's disease/arthritis: Need to hold methotrexate-suspect should be able to resume in the next few weeks if no issues with wound healing..  ?  Odynophagia/dysphagia: Appreciate speech therapy evaluation-per RN-having no issues with dysphagia to diet.  Doubt any further work-up required at this point.   DVT Prophylaxis: Prophylactic Lovenox.  Code Status: Full code  Family Communication: None at bedside  Disposition Plan: Remain  inpatient-CIR when bed available.  Antimicrobial agents: Anti-infectives (From admission, onward)   Start     Dose/Rate Route Frequency Ordered Stop   12/23/17 0600  ceFAZolin (ANCEF) IVPB 2g/100 mL premix     2 g 200 mL/hr over 30 Minutes Intravenous On call to O.R. 12/22/17 1316 12/22/17 1447   12/22/17 2100  ceFAZolin (ANCEF) IVPB 2g/100 mL premix     2 g 200 mL/hr over 30 Minutes Intravenous Every 6 hours 12/22/17 1813 12/23/17 0918   12/22/17 1512  vancomycin (VANCOCIN) powder  Status:  Discontinued       As needed 12/22/17 1512 12/22/17 1613   12/22/17 1321  ceFAZolin (ANCEF) 2-4 GM/100ML-% IVPB    Note to Pharmacy:  Aquilla Hacker   : cabinet override      12/22/17 1321 12/22/17 1417     Procedures: None  CONSULTS:  orthopedic surgery  Time spent: 25- minutes-Greater than 50% of this time was spent in counseling, explanation of diagnosis, planning of further management, and coordination of care.  MEDICATIONS: Scheduled Meds: . Chlorhexidine Gluconate Cloth  6 each Topical Daily  . docusate sodium  100 mg Oral BID  . donepezil  5 mg Oral QHS  . enoxaparin (LOVENOX) injection  40 mg Subcutaneous Q24H  . memantine  5 mg Oral Daily  . mirtazapine  30 mg Oral QHS   Continuous Infusions: . sodium chloride 75 mL/hr at 12/22/17 2013  . methocarbamol (ROBAXIN) IV     PRN Meds:.acetaminophen, alum & mag hydroxide-simeth, HYDROcodone-acetaminophen, HYDROcodone-acetaminophen, magic mouthwash w/lidocaine, magnesium citrate, menthol-cetylpyridinium **OR** phenol, methocarbamol **OR** methocarbamol (ROBAXIN) IV, morphine injection, ondansetron **OR**  ondansetron (ZOFRAN) IV, polyethylene glycol, senna-docusate, sorbitol   PHYSICAL EXAM: Vital signs: Vitals:   12/25/17 2100 12/26/17 0800 12/26/17 1143 12/26/17 1225  BP: 112/69 (!) 103/58 112/61   Pulse: 80 85 81   Resp: 15 18 12    Temp: 98.8 F (37.1 C) 99.1 F (37.3 C) 98.3 F (36.8 C)   TempSrc: Oral Oral Oral   SpO2:  100% 97% 98% 98%  Weight:      Height:       Filed Weights   12/21/17 1757 12/22/17 0415  Weight: 81.6 kg 81.6 kg   Body mass index is 29.05 kg/m.   General appearance:Awake, pleasantly confused-lying comfortably in bed without any distress. Eyes:no scleral icterus. HEENT: Atraumatic and Normocephalic Neck: supple, no JVD. Resp:Good air entry bilaterally,no rales or rhonchi CVS: S1 S2 regular, no murmurs.  GI: Bowel sounds present, Non tender and not distended with no gaurding, rigidity or rebound. Extremities: B/L Lower Ext shows no edema, both legs are warm to touch Neurology:  Non focal Musculoskeletal:No digital cyanosis Skin:No Rash, warm and dry Wounds:N/A  I have personally reviewed following labs and imaging studies  LABORATORY DATA: CBC: Recent Labs  Lab 12/21/17 1838  12/22/17 1839 12/23/17 0408 12/24/17 0707 12/25/17 0402 12/25/17 1611  WBC 7.8   < > 19.3* 12.6* 11.4* 8.8 8.8  NEUTROABS 6.2  --   --   --   --   --   --   HGB 10.7*   < > 10.4* 8.9* 8.5* 7.6* 9.9*  HCT 30.1*   < > 30.4* 25.6* 24.0* 21.7* 28.2*  MCV 82.7   < > 84.4 84.2 82.5 83.8 83.7  PLT 416*   < > 372 297 284 258 292   < > = values in this interval not displayed.    Basic Metabolic Panel: Recent Labs  Lab 12/21/17 1838 12/22/17 0335 12/22/17 1839 12/23/17 0408 12/24/17 0707  NA 141 138  --  135 136  K 4.0 4.1  --  3.9 3.9  CL 104 105  --  102 101  CO2 28 26  --  22 26  GLUCOSE 97 107*  --  184* 103*  BUN 11 7*  --  12 15  CREATININE 0.48 0.52 0.54 0.61 0.54  CALCIUM 8.8* 8.6*  --  8.3* 8.1*    GFR: Estimated Creatinine Clearance: 53.3 mL/min (by C-G formula based on SCr of 0.54 mg/dL).  Liver Function Tests: No results for input(s): AST, ALT, ALKPHOS, BILITOT, PROT, ALBUMIN in the last 168 hours. No results for input(s): LIPASE, AMYLASE in the last 168 hours. No results for input(s): AMMONIA in the last 168 hours.  Coagulation Profile: No results for input(s): INR,  PROTIME in the last 168 hours.  Cardiac Enzymes: Recent Labs  Lab 12/22/17 0639  TROPONINI <0.03    BNP (last 3 results) No results for input(s): PROBNP in the last 8760 hours.  HbA1C: No results for input(s): HGBA1C in the last 72 hours.  CBG: No results for input(s): GLUCAP in the last 168 hours.  Lipid Profile: No results for input(s): CHOL, HDL, LDLCALC, TRIG, CHOLHDL, LDLDIRECT in the last 72 hours.  Thyroid Function Tests: No results for input(s): TSH, T4TOTAL, FREET4, T3FREE, THYROIDAB in the last 72 hours.  Anemia Panel: No results for input(s): VITAMINB12, FOLATE, FERRITIN, TIBC, IRON, RETICCTPCT in the last 72 hours.  Urine analysis:    Component Value Date/Time   COLORURINE YELLOW 12/22/2017 0742   APPEARANCEUR CLOUDY (A) 12/22/2017 2330  LABSPEC 1.011 12/22/2017 0742   PHURINE 9.0 (H) 12/22/2017 0742   GLUCOSEU NEGATIVE 12/22/2017 0742   HGBUR NEGATIVE 12/22/2017 0742   BILIRUBINUR NEGATIVE 12/22/2017 0742   KETONESUR NEGATIVE 12/22/2017 0742   PROTEINUR NEGATIVE 12/22/2017 0742   NITRITE NEGATIVE 12/22/2017 0742   LEUKOCYTESUR NEGATIVE 12/22/2017 0742    Sepsis Labs: Lactic Acid, Venous No results found for: LATICACIDVEN  MICROBIOLOGY: Recent Results (from the past 240 hour(s))  Surgical pcr screen     Status: Abnormal   Collection Time: 12/21/17 10:48 PM  Result Value Ref Range Status   MRSA, PCR NEGATIVE NEGATIVE Final   Staphylococcus aureus POSITIVE (A) NEGATIVE Final    Comment: (NOTE) The Xpert SA Assay (FDA approved for NASAL specimens in patients 34 years of age and older), is one component of a comprehensive surveillance program. It is not intended to diagnose infection nor to guide or monitor treatment. Performed at [facility], [address] [place], [place] [address]     RADIOLOGY STUDIES/RESULTS: Dg Chest 1 View  Result Date: 12/21/2017 CLINICAL DATA:  Patient fell today while walking in her house. Left hip and  knee pain. EXAM: CHEST  1 VIEW COMPARISON:  Left shoulder radiographs 12/07/2017 FINDINGS: Redemonstration of subacute displaced overlapping fracture of the proximal left humerus involving the surgical neck. Osteoarthritis of the [facility] and both glenohumeral joints. Cardiomegaly with aortic atherosclerosis. No acute pulmonary consolidation or pneumothorax. No effusion. No acute displaced rib fracture. IMPRESSION: Cardiomegaly with aortic atherosclerosis. No active pulmonary disease. Known subacute left surgical neck fracture of the humerus with displaced humeral diaphysis as before. Electronically Signed   By: Tollie Eth M.D.   On: 12/21/2017 19:48   Pelvis Portable  Result Date: 12/22/2017 CLINICAL DATA:  Anterior approach left hip arthroplasty EXAM: PORTABLE PELVIS 1-2 VIEWS COMPARISON:  12/22/2017 intraoperative radiographs. FINDINGS: Postop change with soft tissue emphysema overlying the left hip is identified. Intact uncemented left total hip arthroplasty without complicating features or postoperative fracture. Degenerative disc disease and facet arthrosis of the included lower lumbar spine from L4 through S1. The bony pelvis appears intact with osteoarthritic change of the SI joints. Pubic symphysis is maintained. The native right hip joint is intact with minimal axial joint space narrowing noted. No fracture of the right hip. IMPRESSION: No immediate complications status post left total hip arthroplasty. Electronically Signed   By: Tollie Eth M.D.   On: 12/22/2017 17:43   Dg Shoulder Left  Result Date: 11/29/2017 CLINICAL DATA:  84 year old who tripped and fell on her porch this evening and injured the LEFT shoulder. Initial encounter. EXAM: LEFT SHOULDER - 2+ VIEW COMPARISON:  LEFT shoulder x-rays and MRI 04/12/2006. FINDINGS: Acute comminuted fracture involving the proximal LEFT humeral metaphysis. Avulsion fracture arising from the humeral head. Narrowed subacromial space. Degenerative changes  involving the acromioclavicular joint. Glenohumeral joint anatomically aligned with narrowing of the joint space. Generalized osseous demineralization. IMPRESSION: 1. Acute comminuted fracture involving the proximal LEFT humeral metaphysis. 2. Avulsion fracture arising from the RIGHT head. 3. Narrowed subacromial space indicating chronic supraspinatus tendon disease. 4. Osseous demineralization. 5. Osteoarthritis involving the glenohumeral joint and the AC joint. Electronically Signed   By: Hulan Saas M.D.   On: 11/29/2017 21:03   Dg Shoulder Left Port  Result Date: 12/22/2017 CLINICAL DATA:  Left shoulder fracture.  Follow-up. EXAM: LEFT SHOULDER - 1 VIEW COMPARISON:  11/29/2017 FINDINGS: Left humeral neck fracture again noted. There is anterior displacement on the Y-view with significant angulation. This  has changed since prior study. No subluxation or dislocation. IMPRESSION: Increasing anterior displacement and angulation at the left humeral neck fracture. Electronically Signed   By: Charlett Nose M.D.   On: 12/22/2017 09:19   Dg C-arm 1-60 Min  Result Date: 12/22/2017 CLINICAL DATA:  LEFT hip fracture. EXAM: DG C-ARM 61-120 MIN; OPERATIVE LEFT HIP WITH PELVIS COMPARISON:  Hip films 12/21/2017. FINDINGS: LEFT total hip arthroplasty.  Satisfactory position and alignment. IMPRESSION: No adverse features. Electronically Signed   By: Elsie Stain M.D.   On: 12/22/2017 15:48   Dg Hip Operative Unilat W Or W/o Pelvis Left  Result Date: 12/22/2017 CLINICAL DATA:  LEFT hip fracture. EXAM: DG C-ARM 61-120 MIN; OPERATIVE LEFT HIP WITH PELVIS COMPARISON:  Hip films 12/21/2017. FINDINGS: LEFT total hip arthroplasty.  Satisfactory position and alignment. IMPRESSION: No adverse features. Electronically Signed   By: Elsie Stain M.D.   On: 12/22/2017 15:48   Dg Hip Unilat With Pelvis 2-3 Views Left  Result Date: 12/21/2017 CLINICAL DATA:  Fall.  Left hip pain. EXAM: DG HIP (WITH OR WITHOUT PELVIS) 2-3V  LEFT COMPARISON:  None. FINDINGS: Bones are diffusely demineralized. Frontal pelvis shows unremarkable SI joints and symphysis pubis. Left femoral neck fracture in subcapsular to transcervical location shows varus angulation. No worrisome lytic or sclerotic osseous abnormality. IMPRESSION: Left femoral neck fracture with varus angulation. Electronically Signed   By: Kennith Center M.D.   On: 12/21/2017 19:47   Dg Femur Min 2 Views Left  Result Date: 12/21/2017 CLINICAL DATA:  Fall.  Left hip and knee pain. EXAM: LEFT FEMUR 2 VIEWS COMPARISON:  None. FINDINGS: Two views study reviewed along with the left hip exam. Two-view exam of the left femur shows a femoral neck fracture with varus angulation. No worrisome lytic or sclerotic osseous abnormality. Advanced degenerative changes are evident at the knee with probable intra-articular loose body. IMPRESSION: 1. Femoral neck fracture with varus angulation. 2. Marked degenerative changes in the knee with intra-articular loose body. Electronically Signed   By: Kennith Center M.D.   On: 12/21/2017 19:45   Xr Humerus Left  Result Date: 12/21/2017 2 views of the left proximal humerus show a 2 part proximal humerus fracture with slight interval healing.  Xr Shoulder 1v Left  Result Date: 12/07/2017 Single view of the left shoulder shows a 2 part proximal humerus fracture that is displaced.    LOS: 5 days   Jeoffrey Massed, MD  [facility]  If 7PM-7AM, please contact night-coverage  Please page via [email or web address]-Password [email or web address]-click on MD name and type text message  12/26/2017, 1:02 PM

## 2017-12-27 LAB — CBC
HEMATOCRIT: 28.7 % — AB (ref 36.0–46.0)
Hemoglobin: 9.9 g/dL — ABNORMAL LOW (ref 12.0–15.0)
MCH: 28.9 pg (ref 26.0–34.0)
MCHC: 34.5 g/dL (ref 30.0–36.0)
MCV: 83.7 fL (ref 78.0–100.0)
Platelets: 354 10*3/uL (ref 150–400)
RBC: 3.43 MIL/uL — ABNORMAL LOW (ref 3.87–5.11)
RDW: 15.5 % (ref 11.5–15.5)
WBC: 7.7 10*3/uL (ref 4.0–10.5)

## 2017-12-27 NOTE — Progress Notes (Signed)
Inpatient Rehabilitation-Admissions Coordinator   [facility] made aware by insurance that pt's denial for CIR was upheld. AC has communicated with SW/CM regarding need for new dispo needs. Family made aware.   AC will sign off.   Please call if questions.   Nanine Means, OTR/L  Rehab Admissions Coordinator  [phone] 12/27/2017 9:09 AM

## 2017-12-27 NOTE — Progress Notes (Signed)
CSW left voicemail with son after consulting with Dr. Who reports anticipated D/C tomorrow. CSW inquiring about SNF choice as soon as possible. No response at this time.   [place], [place] [phone]

## 2017-12-27 NOTE — Progress Notes (Signed)
CSW provded son [name] with list of SNF's in [place] area. Family is meeting this evening, son will be available tomorrow to speak with concerning top 3 choices to being referral process/insurance auth.   [place], [place] [phone]

## 2017-12-27 NOTE — Progress Notes (Signed)
PROGRESS NOTE        PATIENT DETAILS Name: Connie Snyder Age: 84 y.o. Sex: female Date of Birth: [birth year] Admit Date: 12/21/2017 Admitting Physician Ejiroghene Wendall Stade, MD OBS:JGGEZMO, No Pcp Per  Brief Narrative: Patient is a 84 y.o. female with prior history of dementia, Sjogren's disease/arthritis on methotrexate admitted following a mechanical fall found to have a left hip fracture.  See below for further details.  Subjective: Remains pleasantly confused-no major issues overnight.    Assessment/Plan: Left hip fracture: Following a mechanical fall-underwent left hip replacement on 9/20.orthopedics following-WBAT to LLE, continue Lovenox for prophylaxis.  Insurance did not approve for CIR, current plan for SNF  Anemia: Secondary to perioperative blood loss-hemoglobin stable-transfuse 1 unit of PRBC so far.  Continue to follow CBC periodically.  Recent history of left proximal humerus fracture: Occurred approximately 3 weeks prior to this hospital stay, followed by Dr. Roe Rutherford [facility] to LUE  Dementia: Remains pleasantly confused-she is close to usual baseline-continue Namenda and Aricept.      Left bundle branch block: Probably chronic-no prior EKGs to compare with.  She has tolerated surgery well.  She apparently is highly functional-unable to play with her grandson.  She has no prior history of CAD.   Hypertension: Controlled without the use of any antihypertensives-resume when able.    Sjogren's disease/arthritis: Need to hold methotrexate-suspect should be able to resume in the next few weeks if no issues with wound healing..  ?  Odynophagia/dysphagia: Appreciate speech therapy evaluation-per RN-having no issues with dysphagia to diet.  Doubt any further work-up required at this point.   DVT Prophylaxis: Prophylactic Lovenox.  Code Status: Full code  Family Communication: Son at bedside  Disposition Plan: Insurance did not  approve for CIR, discharge for SNF,  Antimicrobial agents: Anti-infectives (From admission, onward)   Start     Dose/Rate Route Frequency Ordered Stop   12/23/17 0600  ceFAZolin (ANCEF) IVPB 2g/100 mL premix     2 g 200 mL/hr over 30 Minutes Intravenous On call to O.R. 12/22/17 1316 12/22/17 1447   12/22/17 2100  ceFAZolin (ANCEF) IVPB 2g/100 mL premix     2 g 200 mL/hr over 30 Minutes Intravenous Every 6 hours 12/22/17 1813 12/23/17 0918   12/22/17 1512  vancomycin (VANCOCIN) powder  Status:  Discontinued       As needed 12/22/17 1512 12/22/17 1613   12/22/17 1321  ceFAZolin (ANCEF) 2-4 GM/100ML-% IVPB    Note to Pharmacy:  Aquilla Hacker   : cabinet override      12/22/17 1321 12/22/17 1417     Procedures: None  CONSULTS:  orthopedic surgery  Time spent: 25- minutes-Greater than 50% of this time was spent in counseling, explanation of diagnosis, planning of further management, and coordination of care.  MEDICATIONS: Scheduled Meds: . docusate sodium  100 mg Oral BID  . donepezil  5 mg Oral QHS  . enoxaparin (LOVENOX) injection  40 mg Subcutaneous Q24H  . feeding supplement (ENSURE ENLIVE)  237 mL Oral BID BM  . memantine  5 mg Oral Daily  . mirtazapine  30 mg Oral QHS   Continuous Infusions: . sodium chloride 75 mL/hr at 12/22/17 2013  . methocarbamol (ROBAXIN) IV     PRN Meds:.acetaminophen, alum & mag hydroxide-simeth, HYDROcodone-acetaminophen, HYDROcodone-acetaminophen, magic mouthwash w/lidocaine, magnesium citrate, menthol-cetylpyridinium **OR** phenol, methocarbamol **OR** methocarbamol (ROBAXIN) IV,  morphine injection, ondansetron **OR** ondansetron (ZOFRAN) IV, polyethylene glycol, senna-docusate, sorbitol   PHYSICAL EXAM: Vital signs: Vitals:   12/26/17 1225 12/26/17 1551 12/26/17 1953 12/27/17 0418  BP:  (!) 99/54 (!) 108/52 110/64  Pulse:  91 93 95  Resp:  16 13 15   Temp:  98.6 F (37 C) 99 F (37.2 C) 98.7 F (37.1 C)  TempSrc:  Oral Oral Oral    SpO2: 98% 100% 100% 100%  Weight:      Height:       Filed Weights   12/21/17 1757 12/22/17 0415  Weight: 81.6 kg 81.6 kg   Body mass index is 29.05 kg/m.   Awake Alert, pleasantly confused, denies any complaints  symmetrical Chest wall movement, Good air movement bilaterally, CTAB RRR,No Gallops,Rubs or new Murmurs, No Parasternal Heave +ve B.Sounds, Abd Soft, No tenderness, No rebound - guarding or rigidity. No Cyanosis, Clubbing or edema, No new Rash or bruise, both legs warm to touch   I have personally reviewed following labs and imaging studies  LABORATORY DATA: CBC: Recent Labs  Lab 12/21/17 1838  12/23/17 0408 12/24/17 0707 12/25/17 0402 12/25/17 1611 12/27/17 0344  WBC 7.8   < > 12.6* 11.4* 8.8 8.8 7.7  NEUTROABS 6.2  --   --   --   --   --   --   HGB 10.7*   < > 8.9* 8.5* 7.6* 9.9* 9.9*  HCT 30.1*   < > 25.6* 24.0* 21.7* 28.2* 28.7*  MCV 82.7   < > 84.2 82.5 83.8 83.7 83.7  PLT 416*   < > 297 284 258 292 354   < > = values in this interval not displayed.    Basic Metabolic Panel: Recent Labs  Lab 12/21/17 1838 12/22/17 0335 12/22/17 1839 12/23/17 0408 12/24/17 0707  NA 141 138  --  135 136  K 4.0 4.1  --  3.9 3.9  CL 104 105  --  102 101  CO2 28 26  --  22 26  GLUCOSE 97 107*  --  184* 103*  BUN 11 7*  --  12 15  CREATININE 0.48 0.52 0.54 0.61 0.54  CALCIUM 8.8* 8.6*  --  8.3* 8.1*    GFR: Estimated Creatinine Clearance: 53.3 mL/min (by C-G formula based on SCr of 0.54 mg/dL).  Liver Function Tests: No results for input(s): AST, ALT, ALKPHOS, BILITOT, PROT, ALBUMIN in the last 168 hours. No results for input(s): LIPASE, AMYLASE in the last 168 hours. No results for input(s): AMMONIA in the last 168 hours.  Coagulation Profile: No results for input(s): INR, PROTIME in the last 168 hours.  Cardiac Enzymes: Recent Labs  Lab 12/22/17 0639  TROPONINI <0.03    BNP (last 3 results) No results for input(s): PROBNP in the last 8760  hours.  HbA1C: No results for input(s): HGBA1C in the last 72 hours.  CBG: No results for input(s): GLUCAP in the last 168 hours.  Lipid Profile: No results for input(s): CHOL, HDL, LDLCALC, TRIG, CHOLHDL, LDLDIRECT in the last 72 hours.  Thyroid Function Tests: No results for input(s): TSH, T4TOTAL, FREET4, T3FREE, THYROIDAB in the last 72 hours.  Anemia Panel: No results for input(s): VITAMINB12, FOLATE, FERRITIN, TIBC, IRON, RETICCTPCT in the last 72 hours.  Urine analysis:    Component Value Date/Time   COLORURINE YELLOW 12/22/2017 0742   APPEARANCEUR CLOUDY (A) 12/22/2017 0742   LABSPEC 1.011 12/22/2017 0742   PHURINE 9.0 (H) 12/22/2017 0742   GLUCOSEU NEGATIVE  12/22/2017 0742   HGBUR NEGATIVE 12/22/2017 0742   BILIRUBINUR NEGATIVE 12/22/2017 0742   KETONESUR NEGATIVE 12/22/2017 0742   PROTEINUR NEGATIVE 12/22/2017 0742   NITRITE NEGATIVE 12/22/2017 0742   LEUKOCYTESUR NEGATIVE 12/22/2017 0742    Sepsis Labs: Lactic Acid, Venous No results found for: LATICACIDVEN  MICROBIOLOGY: Recent Results (from the past 240 hour(s))  Surgical pcr screen     Status: Abnormal   Collection Time: 12/21/17 10:48 PM  Result Value Ref Range Status   MRSA, PCR NEGATIVE NEGATIVE Final   Staphylococcus aureus POSITIVE (A) NEGATIVE Final    Comment: (NOTE) The Xpert SA Assay (FDA approved for NASAL specimens in patients 26 years of age and older), is one component of a comprehensive surveillance program. It is not intended to diagnose infection nor to guide or monitor treatment. Performed at [facility], [address], [place], [place] [address]     RADIOLOGY STUDIES/RESULTS: Dg Chest 1 View  Result Date: 12/21/2017 CLINICAL DATA:  Patient fell today while walking in her house. Left hip and knee pain. EXAM: CHEST  1 VIEW COMPARISON:  Left shoulder radiographs 12/07/2017 FINDINGS: Redemonstration of subacute displaced overlapping fracture of the proximal left humerus  involving the surgical neck. Osteoarthritis of the [facility] and both glenohumeral joints. Cardiomegaly with aortic atherosclerosis. No acute pulmonary consolidation or pneumothorax. No effusion. No acute displaced rib fracture. IMPRESSION: Cardiomegaly with aortic atherosclerosis. No active pulmonary disease. Known subacute left surgical neck fracture of the humerus with displaced humeral diaphysis as before. Electronically Signed   By: Tollie Eth M.D.   On: 12/21/2017 19:48   Pelvis Portable  Result Date: 12/22/2017 CLINICAL DATA:  Anterior approach left hip arthroplasty EXAM: PORTABLE PELVIS 1-2 VIEWS COMPARISON:  12/22/2017 intraoperative radiographs. FINDINGS: Postop change with soft tissue emphysema overlying the left hip is identified. Intact uncemented left total hip arthroplasty without complicating features or postoperative fracture. Degenerative disc disease and facet arthrosis of the included lower lumbar spine from L4 through S1. The bony pelvis appears intact with osteoarthritic change of the SI joints. Pubic symphysis is maintained. The native right hip joint is intact with minimal axial joint space narrowing noted. No fracture of the right hip. IMPRESSION: No immediate complications status post left total hip arthroplasty. Electronically Signed   By: Tollie Eth M.D.   On: 12/22/2017 17:43   Dg Shoulder Left  Result Date: 11/29/2017 CLINICAL DATA:  83 year old who tripped and fell on her porch this evening and injured the LEFT shoulder. Initial encounter. EXAM: LEFT SHOULDER - 2+ VIEW COMPARISON:  LEFT shoulder x-rays and MRI 04/12/2006. FINDINGS: Acute comminuted fracture involving the proximal LEFT humeral metaphysis. Avulsion fracture arising from the humeral head. Narrowed subacromial space. Degenerative changes involving the acromioclavicular joint. Glenohumeral joint anatomically aligned with narrowing of the joint space. Generalized osseous demineralization. IMPRESSION: 1. Acute comminuted  fracture involving the proximal LEFT humeral metaphysis. 2. Avulsion fracture arising from the RIGHT head. 3. Narrowed subacromial space indicating chronic supraspinatus tendon disease. 4. Osseous demineralization. 5. Osteoarthritis involving the glenohumeral joint and the AC joint. Electronically Signed   By: Hulan Saas M.D.   On: 11/29/2017 21:03   Dg Shoulder Left Port  Result Date: 12/22/2017 CLINICAL DATA:  Left shoulder fracture.  Follow-up. EXAM: LEFT SHOULDER - 1 VIEW COMPARISON:  11/29/2017 FINDINGS: Left humeral neck fracture again noted. There is anterior displacement on the Y-view with significant angulation. This has changed since prior study. No subluxation or dislocation. IMPRESSION: Increasing anterior displacement and angulation  at the left humeral neck fracture. Electronically Signed   By: Charlett Nose M.D.   On: 12/22/2017 09:19   Dg C-arm 1-60 Min  Result Date: 12/22/2017 CLINICAL DATA:  LEFT hip fracture. EXAM: DG C-ARM 61-120 MIN; OPERATIVE LEFT HIP WITH PELVIS COMPARISON:  Hip films 12/21/2017. FINDINGS: LEFT total hip arthroplasty.  Satisfactory position and alignment. IMPRESSION: No adverse features. Electronically Signed   By: Elsie Stain M.D.   On: 12/22/2017 15:48   Dg Hip Operative Unilat W Or W/o Pelvis Left  Result Date: 12/22/2017 CLINICAL DATA:  LEFT hip fracture. EXAM: DG C-ARM 61-120 MIN; OPERATIVE LEFT HIP WITH PELVIS COMPARISON:  Hip films 12/21/2017. FINDINGS: LEFT total hip arthroplasty.  Satisfactory position and alignment. IMPRESSION: No adverse features. Electronically Signed   By: Elsie Stain M.D.   On: 12/22/2017 15:48   Dg Hip Unilat With Pelvis 2-3 Views Left  Result Date: 12/21/2017 CLINICAL DATA:  Fall.  Left hip pain. EXAM: DG HIP (WITH OR WITHOUT PELVIS) 2-3V LEFT COMPARISON:  None. FINDINGS: Bones are diffusely demineralized. Frontal pelvis shows unremarkable SI joints and symphysis pubis. Left femoral neck fracture in subcapsular to  transcervical location shows varus angulation. No worrisome lytic or sclerotic osseous abnormality. IMPRESSION: Left femoral neck fracture with varus angulation. Electronically Signed   By: Kennith Center M.D.   On: 12/21/2017 19:47   Dg Femur Min 2 Views Left  Result Date: 12/21/2017 CLINICAL DATA:  Fall.  Left hip and knee pain. EXAM: LEFT FEMUR 2 VIEWS COMPARISON:  None. FINDINGS: Two views study reviewed along with the left hip exam. Two-view exam of the left femur shows a femoral neck fracture with varus angulation. No worrisome lytic or sclerotic osseous abnormality. Advanced degenerative changes are evident at the knee with probable intra-articular loose body. IMPRESSION: 1. Femoral neck fracture with varus angulation. 2. Marked degenerative changes in the knee with intra-articular loose body. Electronically Signed   By: Kennith Center M.D.   On: 12/21/2017 19:45   Xr Humerus Left  Result Date: 12/21/2017 2 views of the left proximal humerus show a 2 part proximal humerus fracture with slight interval healing.  Xr Shoulder 1v Left  Result Date: 12/07/2017 Single view of the left shoulder shows a 2 part proximal humerus fracture that is displaced.    LOS: 6 days   Huey Bienenstock, MD  [facility]  If 7PM-7AM, please contact night-coverage  Please page via [email or web address]-Password [email or web address]-click on MD name and type text message  12/27/2017, 2:18 PM

## 2017-12-27 NOTE — NC FL2 (Signed)
[place] MEDICAID FL2 LEVEL OF CARE SCREENING TOOL     IDENTIFICATION  Patient Name: Connie Snyder Birthdate: [birth year] Sex: female Admission Date (Current Location): 12/21/2017  [facility] and [place] Number:  Producer, television/film/video and Address:  [facility], [address], [place], [place] [address]      Provider Number: [id number]  Attending Physician Name and Address:  Starleen Arms, MD  Relative Name and Phone Number:  [name] (son) [phone]    Current Level of Care: Hospital Recommended Level of Care: Skilled Nursing Facility Prior Approval Number:    Date Approved/Denied: 12/27/17 PASRR Number: [id number]  Discharge Plan: SNF    Current Diagnoses: Patient Active Problem List   Diagnosis Date Noted  . Normocytic normochromic anemia 12/22/2017  . S/p left hip fracture 12/22/2017  . Malnutrition of moderate degree 12/22/2017  . Closed fracture of left hip (HCC) 12/21/2017  . Closed fracture of left proximal humerus 11/30/2017    Orientation RESPIRATION BLADDER Height & Weight     Self  Normal Continent Weight: 179 lb 14.3 oz (81.6 kg) Height:  5' 5.98" (167.6 cm)  BEHAVIORAL SYMPTOMS/MOOD NEUROLOGICAL BOWEL NUTRITION STATUS      Continent Diet(see discharge summary)  AMBULATORY STATUS COMMUNICATION OF NEEDS Skin   Limited Assist Verbally Surgical wounds(eczema, left hip surgical closed incision)                       Personal Care Assistance Level of Assistance  Bathing, Feeding, Dressing, Total care Bathing Assistance: Maximum assistance Feeding assistance: Limited assistance Dressing Assistance: Maximum assistance Total Care Assistance: Maximum assistance   Functional Limitations Info  Sight, Hearing, Speech Sight Info: Adequate Hearing Info: Adequate Speech Info: Adequate    SPECIAL CARE FACTORS FREQUENCY  PT (By licensed PT), OT (By licensed OT)     PT Frequency: 5x weekly OT Frequency: 5x weekly             Contractures Contractures Info: Not present    Additional Factors Info  Code Status, Allergies Code Status Info: full Allergies Info: no known           Current Medications (12/27/2017):  This is the current hospital active medication list Current Facility-Administered Medications  Medication Dose Route Frequency Provider Last Rate Last Dose  . 0.9 %  sodium chloride infusion   Intravenous Continuous Maretta Bees, MD 75 mL/hr at 12/22/17 2013    . acetaminophen (TYLENOL) tablet 325-650 mg  325-650 mg Oral Q6H PRN Tarry Kos, MD   650 mg at 12/27/17 1136  . alum & mag hydroxide-simeth (MAALOX/MYLANTA) 200-200-20 MG/5ML suspension 30 mL  30 mL Oral Q4H PRN Tarry Kos, MD      . docusate sodium (COLACE) capsule 100 mg  100 mg Oral BID Tarry Kos, MD   100 mg at 12/27/17 1136  . donepezil (ARICEPT) tablet 5 mg  5 mg Oral QHS Tarry Kos, MD   5 mg at 12/26/17 2219  . enoxaparin (LOVENOX) injection 40 mg  40 mg Subcutaneous Q24H Tarry Kos, MD   40 mg at 12/27/17 1136  . feeding supplement (ENSURE ENLIVE) (ENSURE ENLIVE) liquid 237 mL  237 mL Oral BID BM Maretta Bees, MD   237 mL at 12/27/17 1137  . HYDROcodone-acetaminophen (NORCO) 7.5-325 MG per tablet 1-2 tablet  1-2 tablet Oral Q4H PRN Tarry Kos, MD      . HYDROcodone-acetaminophen (NORCO/VICODIN) 5-325 MG  per tablet 1-2 tablet  1-2 tablet Oral Q4H PRN Tarry Kos, MD   2 tablet at 12/27/17 0429  . magic mouthwash w/lidocaine  10 mL Oral QID PRN Maretta Bees, MD      . magnesium citrate solution 1 Bottle  1 Bottle Oral Once PRN Tarry Kos, MD      . memantine [facility]) tablet 5 mg  5 mg Oral Daily Tarry Kos, MD   5 mg at 12/27/17 1137  . menthol-cetylpyridinium (CEPACOL) lozenge 3 mg  1 lozenge Oral PRN Tarry Kos, MD       Or  . phenol (CHLORASEPTIC) mouth spray 1 spray  1 spray Mouth/Throat PRN Tarry Kos, MD      . methocarbamol (ROBAXIN) tablet 500 mg  500 mg Oral Q6H PRN  Tarry Kos, MD   500 mg at 12/26/17 0125   Or  . methocarbamol (ROBAXIN) 500 mg in dextrose 5 % 50 mL IVPB  500 mg Intravenous Q6H PRN Tarry Kos, MD      . mirtazapine (REMERON) tablet 30 mg  30 mg Oral QHS Tarry Kos, MD   30 mg at 12/26/17 2218  . morphine 2 MG/ML injection 1 mg  1 mg Intravenous Q2H PRN Tarry Kos, MD      . ondansetron [facility]) tablet 4 mg  4 mg Oral Q6H PRN Tarry Kos, MD       Or  . ondansetron [facility]) injection 4 mg  4 mg Intravenous Q6H PRN Tarry Kos, MD      . polyethylene glycol (MIRALAX / GLYCOLAX) packet 17 g  17 g Oral Daily PRN Tarry Kos, MD      . senna-docusate (Senokot-S) tablet 1 tablet  1 tablet Oral QHS PRN Tarry Kos, MD      . sorbitol 70 % solution 30 mL  30 mL Oral Daily PRN Tarry Kos, MD         Discharge Medications: Please see discharge summary for a list of discharge medications.  Relevant Imaging Results:  Relevant Lab Results:   Additional Information SSN: [id number]  Gildardo Griffes, LCSW

## 2017-12-27 NOTE — Progress Notes (Signed)
D/W Dr Jerral Ralph, peer to peer done 12/26/2017 evening, still not approved to CIR, plan for SNF placment. Huey Bienenstock MD

## 2017-12-27 NOTE — Progress Notes (Signed)
Physical Therapy Treatment Patient Details Name: Connie Snyder MRN: [id number] DOB: 07-18-1927 Today's Date: 12/27/2017    History of Present Illness  Pt is an 84 y.o. female with prior history of dementia, Sjogren's disease/arthritis, and L proximal humeral fx sustained in a fall approx 3 weeks ago. She was admitted following a second mechanical fall sustaining a left hip fracture. She underwent L THA 12-22-17.     PT Comments    Pt performed supine exercises in bed with AAROM for LLE.  Pt with pain with movement but good tolerance.  Pt performed OOB to [facility] and BSC to recliner.  Pt able to perform short bouts of gt between surfaces.  Plan remains appropriate for rehab in a post acute setting to improve strength and function before returning home.      Follow Up Recommendations  CIR     Equipment Recommendations  (hemiwalker)    Recommendations for Other Services Rehab consult     Precautions / Restrictions Precautions Precautions: Fall;Other (comment) Precaution Comments: Lt UE humeral fx (3-4WA)  Required Braces or Orthoses: Sling Restrictions Weight Bearing Restrictions: Yes LUE Weight Bearing: Non weight bearing LLE Weight Bearing: Weight bearing as tolerated    Mobility  Bed Mobility Overal bed mobility: Needs Assistance Bed Mobility: Supine to Sit     Supine to sit: Mod assist;HOB elevated     General bed mobility comments: Pt required assistance with B LEs to edge of bed, cues for R hand placement and use of bed pad to advance to edge of bed.    Transfers Overall transfer level: Needs assistance Equipment used: Hemi-walker Transfers: Sit to/from Stand Sit to Stand: Mod assist         General transfer comment: Pt required assistance to boost into standing.  Pt with LOB forward backwards and laterally requring mod assistance to correct and stabilize.    Ambulation/Gait Ambulation/Gait assistance: Mod assist;+2 safety/equipment Gait Distance (Feet): 4  Feet(+ 6 ft.  Pt sat on BSC inbetween trials.  ) Assistive device: Hemi-walker Gait Pattern/deviations: Step-to pattern;Shuffle;Trunk flexed;Decreased stride length     General Gait Details: Pt required assistance to weight shift and advance steps forward, assistance to turn and back with hemiwalker.     Stairs             Wheelchair Mobility    Modified Rankin (Stroke Patients Only)       Balance Overall balance assessment: Needs assistance   Sitting balance-Leahy Scale: Good       Standing balance-Leahy Scale: Poor                              Cognition Arousal/Alertness: Awake/alert Behavior During Therapy: WFL for tasks assessed/performed Overall Cognitive Status: History of cognitive impairments - at baseline                                 General Comments: h/o dementia      Exercises General Exercises - Lower Extremity Ankle Circles/Pumps: AROM;Both;10 reps;Supine Quad Sets: AROM;10 reps;Supine;Both Short Arc Quad: AROM;10 reps;Supine;AAROM Heel Slides: AROM;AAROM;Left;10 reps;Supine Hip ABduction/ADduction: AROM;AAROM;Left;10 reps;Supine Straight Leg Raises: AAROM;AROM;Left;10 reps;Supine    General Comments        Pertinent Vitals/Pain Pain Assessment: Faces Faces Pain Scale: Hurts even more Pain Location: L hip Pain Descriptors / Indicators: Sore;Grimacing;Operative site guarding Pain Intervention(s): Monitored during session;Repositioned    Home  Living                      Prior Function            PT Goals (current goals can now be found in the care plan section) Acute Rehab PT Goals Patient Stated Goal: home Potential to Achieve Goals: Good Progress towards PT goals: Progressing toward goals    Frequency    Min 3X/week      PT Plan Current plan remains appropriate    Co-evaluation              AM-PAC PT "6 Clicks" Daily Activity  Outcome Measure  Difficulty turning over in  bed (including adjusting bedclothes, sheets and blankets)?: A Lot Difficulty moving from lying on back to sitting on the side of the bed? : Unable Difficulty sitting down on and standing up from a chair with arms (e.g., wheelchair, bedside commode, etc,.)?: Unable Help needed moving to and from a bed to chair (including a wheelchair)?: A Little Help needed walking in hospital room?: A Lot Help needed climbing 3-5 steps with a railing? : A Lot 6 Click Score: 11    End of Session Equipment Utilized During Treatment: Gait belt;Other (comment) Activity Tolerance: Patient tolerated treatment well Patient left: in chair;with call bell/phone within reach;with chair alarm set Nurse Communication: Mobility status PT Visit Diagnosis: Other abnormalities of gait and mobility (R26.89);Repeated falls (R29.6);Pain Pain - Right/Left: Left Pain - part of body: Hip     Time: 3810-1751 PT Time Calculation (min) (ACUTE ONLY): 43 min  Charges:  $Gait Training: 8-22 mins $Therapeutic Exercise: 8-22 mins $Therapeutic Activity: 8-22 mins                     Joycelyn Rua, PTA Acute Rehabilitation Services Pager [phone] Office [phone]     Connie Snyder 12/27/2017, 5:57 PM

## 2017-12-28 MED ORDER — ENOXAPARIN SODIUM 40 MG/0.4ML ~~LOC~~ SOLN: 40.0000 mg | Freq: Every day | SUBCUTANEOUS | 0 refills | Status: DC

## 2017-12-28 MED ORDER — HYDROCODONE-ACETAMINOPHEN 7.5-325 MG PO TABS: 1.0000 | ORAL_TABLET | ORAL | 0 refills | Status: DC | PRN

## 2017-12-28 MED ORDER — ONDANSETRON HCL 4 MG PO TABS: 4.0000 mg | ORAL_TABLET | Freq: Four times a day (QID) | ORAL | 0 refills | Status: DC | PRN

## 2017-12-28 MED ORDER — DOCUSATE SODIUM 100 MG PO CAPS: 100.0000 mg | ORAL_CAPSULE | Freq: Two times a day (BID) | ORAL | 0 refills | Status: DC

## 2017-12-28 MED ORDER — ASPIRIN EC 81 MG PO TBEC: 81.0000 mg | DELAYED_RELEASE_TABLET | Freq: Every day | ORAL | Status: DC

## 2017-12-28 MED ORDER — SENNOSIDES-DOCUSATE SODIUM 8.6-50 MG PO TABS: 1.0000 | ORAL_TABLET | Freq: Every evening | ORAL | Status: DC | PRN

## 2017-12-28 NOTE — Clinical Social Work Placement (Signed)
   CLINICAL SOCIAL WORK PLACEMENT  NOTE  Date:  12/28/2017  Patient Details  Name: Connie Snyder MRN: [id number] Date of Birth: [birth year]  Clinical Social Work is seeking post-discharge placement for this patient at the Skilled  Nursing Facility level of care (*CSW will initial, date and re-position this form in  chart as items are completed):  Yes   Patient/family provided with [facility] Clinical Social Work Department's list of facilities offering this level of care within the geographic area requested by the patient (or if unable, by the patient's family).  Yes   Patient/family informed of their freedom to choose among providers that offer the needed level of care, that participate in Medicare, Medicaid or managed care program needed by the patient, have an available bed and are willing to accept the patient.      Patient/family informed of [facility]'s ownership interest in [facility] and [facility], as well as of the fact that they are under no obligation to receive care at these facilities.  PASRR submitted to EDS on       PASRR number received on 12/27/17     Existing PASRR number confirmed on       FL2 transmitted to all facilities in geographic area requested by pt/family on 12/27/17     FL2 transmitted to all facilities within larger geographic area on       Patient informed that his/her managed care company has contracts with or will negotiate with certain facilities, including the following:        Yes   Patient/family informed of bed offers received.  Patient chooses bed at Other - please specify in the comment section below:([facility])     Physician recommends and patient chooses bed at Other - please specify in the comment section below:([facility])    Patient to be transferred to Other - please specify in the comment section below:([facility]) on 12/28/17.  Patient to be transferred to facility by [facility]     Patient family notified on 12/28/17 of  transfer.  Name of family member notified:  Son [name]     PHYSICIAN       Additional Comment:    _______________________________________________ Gildardo Griffes, LCSW 12/28/2017, 4:06 PM

## 2017-12-28 NOTE — Progress Notes (Addendum)
Patient son [name] called CSW to report he now wants [facility] instead of [facility]. CSW explained authorization had already started on [facility] but CSW would switch to [facility] and send insurance information after meeting. Patient son became very verbally aggressive in which CSW attempted to calm the son, and ultimately had to end conversation with son due to aggressive language.  CSW notified 5N desk of aggression and that patient son had reported he was heading up to the unit to speak with management as he is very upset.   CSW has started insurance authorization with [facility] who will be making a bed offer for discharge, possibly today.   [place], [place] [phone]

## 2017-12-28 NOTE — Progress Notes (Signed)
CSW called son for 3rd time to ensure notified of discharge and needing SNF choice of placement. Son answered, reported being upset about not being notified regarding discharge. CSW explained 3 calls to son with voicemail left, yesterday and today after speaking with son at hospital.   Connie Snyder reports choosing [facility] as SNF. CSW called [facility] who is able to accept pt, running insurance authorization now and hoping to have acceptance by end of day today.   [place], [place] [phone]

## 2017-12-28 NOTE — Progress Notes (Signed)
  Speech Language Pathology Treatment: Dysphagia  Patient Details Name: Connie Snyder MRN: [id number] DOB: 25-Jan-1928 Today's Date: 12/28/2017 Time: 2836-6294 SLP Time Calculation (min) (ACUTE ONLY): 15 min  Assessment / Plan / Recommendation Clinical Impression  Pt seen with lunch meal with son at bedside. Pt continues to orally hold solids, complains she doesn't eat at this time, doesn't want to eat and expresses some anxiety about eating. No pain noted with swallowing; appears more cognitive in nature with decreased initiation of bolus formation and transit common with dementia and cognitive impairment. Discussed strategies with son including reducing distraction, given a liquid wash, nutritional supplementation, giving sweet but nutritionally dense foods just as Magic cup, and softening foods to puree. Will change diet to puree at this time as son wants pt to focus on increased nutrition. Also suggested interventions to target xerostomia in setting of Sjogren's disease. Will continue to follow acutely, but SLp at next level fo care to also f/u.   HPI HPI: Pt is an 84 y.o. female with prior history of dementia, Sjogren's disease/arthritis and anemia. Per chart, she was admitted to ED on 9/19 with left hip pain secondary to a fall at home (no LOC as witnessed by son). Pt was Intubated and extubated for left total hip anthroplasty 9/20 and per MD, reports pain when swallowing. CXR revealed cardiomegaly with aortic atherosclerosis.      SLP Plan  Continue with current plan of care       Recommendations  Diet recommendations: Dysphagia 1 (puree);Thin liquid Liquids provided via: Cup;Straw Medication Administration: Whole meds with liquid Supervision: Patient able to self feed Compensations: Minimize environmental distractions;Slow rate;Small sips/bites;Follow solids with liquid                Oral Care Recommendations: Oral care BID Plan: Continue with current plan of care        GO               Connie Ditty, MA CCC-SLP  Acute Rehabilitation Services Pager [phone] Office [phone]  Connie Snyder 12/28/2017, 1:38 PM

## 2017-12-28 NOTE — Clinical Social Work Note (Signed)
Clinical Social Work Assessment  Patient Details  Name: Connie Snyder MRN: [id number] Date of Birth: 1927-08-17  Date of referral:  12/24/17               Reason for consult:  Facility Placement                Permission sought to share information with:  Family Supports Permission granted to share information::     Name::     Electronics engineer::  SNF  Relationship::  son  Contact Information:  [phone]  Housing/Transportation Living arrangements for the past 2 months:  Single Family Home Source of Information:  Adult Children Patient Interpreter Needed:  None Criminal Activity/Legal Involvement Pertinent to Current Situation/Hospitalization:  No - Comment as needed Significant Relationships:  Adult Children Lives with:  Adult Children(Brother) Do you feel safe going back to the place where you live?  No Need for family participation in patient care:  No (Coment)  Care giving concerns: CSW received referral for possible SNF placement at time of discharge. Spoke with patient regarding possibility of SNF placement . Patient's son is currently unable to care for her at their home given patient's current needs and fall risk.  Patient and  Son expressed understanding of PT recommendation and are agreeable to SNF placement at time of discharge. CSW to continue to follow and assist with discharge planning needs.     Social Worker assessment / plan:  Spoke with patient and son concerning possibility of rehab at [facility] before returning home.    Employment status:  Retired Database administrator PT Recommendations:  Inpatient Rehab Consult Information / Referral to community resources:  Skilled Nursing Facility  Patient/Family's Response to care:  Patient and son  recognize need for rehab before returning home and are agreeable to a SNF. They report preference for [facility]  . CSW explained insurance authorization process. Patient's family reported that they want  patient to get stronger to be able to come back home.    Patient/Family's Understanding of and Emotional Response to Diagnosis, Current Treatment, and Prognosis:  Patient/family is realistic regarding therapy needs and expressed being hopeful for SNF placement. Patient expressed understanding of CSW role and discharge process as well as medical condition. No questions/concerns about plan or treatment.    Emotional Assessment Appearance:  Appears stated age Attitude/Demeanor/Rapport:  Unable to Assess Affect (typically observed):  Unable to Assess Orientation:  Oriented to Self Alcohol / Substance use:  Not Applicable Psych involvement (Current and /or in the community):  No (Comment)  Discharge Needs  Concerns to be addressed:  Basic Needs, Care Coordination Readmission within the last 30 days:  No Current discharge risk:  Dependent with Mobility Barriers to Discharge:  Continued Medical Work up   [employer], LCSW 12/28/2017, 4:03 PM

## 2017-12-28 NOTE — Progress Notes (Signed)
Patient will DC to:[facility]  Anticipated DC date: 12/28/2017 Family notified: son Corporate investment banker by: Sharin Mons  Per MD patient ready for DC to [facility] . RN, patient, patient's family, and facility notified of DC. Discharge Summary sent to facility. RN given number for report [phone] Room 106. DC packet on chart. Ambulance transport requested for patient.  CSW signing off.  [place], [place] [phone]

## 2017-12-28 NOTE — Progress Notes (Signed)
CSW left 2nd voicemail to son Connie Snyder regarding decision for SNF after no response from voicemail yesterday. No SNF decision has been made at this time, CSW will continue to attempt calls and follow up with room contact in attempts to connect with son regarding decision.   [place], [place] [phone]

## 2017-12-28 NOTE — Discharge Summary (Signed)
Connie Snyder, is a 84 y.o. female  DOB 21-Jun-1927  MRN [id number].  Admission date:  12/21/2017  Admitting Physician  Onnie Boer, MD  Discharge Date:  12/28/2017   Primary MD  Patient, No Pcp Per  Recommendations for primary care physician for things to follow:  -Please check CBC, BMP in 3 days -To follow with orthopedics in 10 days   Admission Diagnosis  Fall [W19.XXXA] Closed fracture of left hip, initial encounter (HCC) [S72.002A] Fall, initial encounter [W19.XXXA]   Discharge Diagnosis  Fall [W19.XXXA] Closed fracture of left hip, initial encounter (HCC) [S72.002A] Fall, initial encounter [W19.XXXA]    Principal Problem:   Closed fracture of left hip (HCC) Active Problems:   Closed fracture of left proximal humerus   Normocytic normochromic anemia   S/p left hip fracture   Malnutrition of moderate degree      Past Medical History:  Diagnosis Date  . Arthritis   . Sjogren's disease [facility])     Past Surgical History:  Procedure Laterality Date  . TOTAL HIP ARTHROPLASTY Left 12/22/2017   Procedure: TOTAL HIP ARTHROPLASTY ANTERIOR APPROACH;  Surgeon: Tarry Kos, MD;  Location: [facility] OR;  Service: Orthopedics;  Laterality: Left;       History of present illness and  Hospital Course:     Kindly see H&P for history of present illness and admission details, please review complete Labs, Consult reports and Test reports for all details in brief  HPI  from the history and physical done on the day of admission 12/22/2017  HPI: Connie Snyder is a 84 y.o. female with history of dementia, Sjogren's disease arthritis on methotrexate, anemia had a fall at home while after returning from a orthopedic office visit.  Was witnessed by patient's son.  Patient slid onto the floor.  Did not lose consciousness.  Patient had a fall 3 weeks ago and had sustained left humerus fracture and  followed up with Dr. Magnus Ivan orthopedic surgeon yesterday.  Patient did not complain of any chest pain headache or any visual symptoms.  Was having left hip pain.  ED Course: In the ER x-rays revealed left hip fracture.  Orthopedic surgeon Dr. Magnus Ivan was notified.  On my exam patient is not in distress.   Hospital Course   Patient is a 84 y.o. female with prior history of dementia, Sjogren's disease/arthritis on methotrexate admitted following a mechanical fall found to have a left hip fracture.  See below for further details.   Left hip fracture: Following a mechanical fall-underwent left hip replacement on 9/20.orthopedics following-WBAT to LLE, continue Lovenox for prophylaxis.  Insurance did not approve for CIR, current plan for SNF  Anemia: Secondary to perioperative blood loss-hemoglobin stable-transfused 1 unit of PRBC so far.  Continue to follow CBC periodically.  She will be discharged on iron supplements  Recent history of left proximal humerus fracture: Occurred approximately 3 weeks prior to this hospital stay, followed by Dr. Roe Rutherford [facility] to LUE  Dementia: Remains pleasantly confused-she is close to  usual baseline-continue Namenda and Aricept.      Left bundle branch block: Probably chronic-no prior EKGs to compare with.  She has tolerated surgery well.  She apparently is highly functional-unable to play with her grandson.  She has no prior history of CAD.   Hypertension: Controlled without the use of any antihypertensives-so her home meds has been stopped on discharge  Sjogren's disease/arthritis: Continue with methotrexate  ?  Odynophagia/dysphagia: Appreciate speech therapy evaluation-per RN-having no issues with dysphagia to diet.  Doubt any further work-up required at this point.    Discharge Condition:  stable      Follow UP  Follow-up Information    Tarry Kos, MD In 2 weeks.   Specialty:  Orthopedic Surgery Why:  For suture removal,  For wound re-check Contact information: [address] [place] [address] [phone]             Discharge Instructions  and  Discharge Medications    Discharge Instructions    Discharge instructions   Complete by:  As directed    Follow with Primary MD or SNF physician in 3 days  Get CBC, CMP,    Activity: Per Ortho recommendation   Disposition SNF   Diet: Heart Healthy , with feeding assistance and aspiration precautions.  For Heart failure patients - Check your Weight same time everyday, if you gain over 2 pounds, or you develop in leg swelling, experience more shortness of breath or chest pain, call your Primary MD immediately. Follow Cardiac Low Salt Diet and 1.5 lit/day fluid restriction.   On your next visit with your primary care physician please Get Medicines reviewed and adjusted.   Please request your Prim.MD to go over all Hospital Tests and Procedure/Radiological results at the follow up, please get all Hospital records sent to your Prim MD by signing hospital release before you go home.   If you experience worsening of your admission symptoms, develop shortness of breath, life threatening emergency, suicidal or homicidal thoughts you must seek medical attention immediately by calling 911 or calling your MD immediately  if symptoms less severe.  You Must read complete instructions/literature along with all the possible adverse reactions/side effects for all the Medicines you take and that have been prescribed to you. Take any new Medicines after you have completely understood and accpet all the possible adverse reactions/side effects.   Do not drive, operating heavy machinery, perform activities at heights, swimming or participation in water activities or provide baby sitting services if your were admitted for syncope or siezures until you have seen by Primary MD or a Neurologist and advised to do so again.  Do not drive when taking Pain  medications.    Do not take more than prescribed Pain, Sleep and Anxiety Medications  Special Instructions: If you have smoked or chewed Tobacco  in the last 2 yrs please stop smoking, stop any regular Alcohol  and or any Recreational drug use.  Wear Seat belts while driving.   Please note  You were cared for by a hospitalist during your hospital stay. If you have any questions about your discharge medications or the care you received while you were in the hospital after you are discharged, you can call the unit and asked to speak with the hospitalist on call if the hospitalist that took care of you is not available. Once you are discharged, your primary care physician will handle any further medical issues. Please note that NO REFILLS for any discharge  medications will be authorized once you are discharged, as it is imperative that you return to your primary care physician (or establish a relationship with a primary care physician if you do not have one) for your aftercare needs so that they can reassess your need for medications and monitor your lab values.   Increase activity slowly   Complete by:  As directed    Weight bearing as tolerated   Complete by:  As directed      Allergies as of 12/28/2017   No Known Allergies     Medication List    STOP taking these medications   aspirin 325 MG tablet Replaced by:  aspirin EC 81 MG tablet   oxyCODONE 5 MG immediate release tablet Commonly known as:  Oxy IR/ROXICODONE   oxyCODONE-acetaminophen 5-325 MG tablet Commonly known as:  PERCOCET/ROXICET   triamterene-hydrochlorothiazide 37.5-25 MG tablet Commonly known as:  MAXZIDE-25     TAKE these medications   aspirin EC 81 MG tablet Take 1 tablet (81 mg total) by mouth daily. Replaces:  aspirin 325 MG tablet   cholecalciferol 1000 units tablet Commonly known as:  VITAMIN D Take 1,000 Units by mouth daily.   docusate sodium 100 MG capsule Commonly known as:  COLACE Take 1  capsule (100 mg total) by mouth 2 (two) times daily.   donepezil 5 MG tablet Commonly known as:  ARICEPT   enoxaparin 40 MG/0.4ML injection Commonly known as:  LOVENOX Inject 0.4 mLs (40 mg total) into the skin daily for 22 days.   feeding supplement Liqd Take 1 Container by mouth 2 (two) times daily between meals.   ferrous sulfate 325 (65 FE) MG tablet Take 325 mg by mouth daily with breakfast.   folic acid 400 MCG tablet Commonly known as:  FOLVITE Take 800 mcg by mouth daily.   HYDROcodone-acetaminophen 7.5-325 MG tablet Commonly known as:  NORCO Take 1 tablet by mouth every 4 (four) hours as needed for severe pain (pain score 7-10).   memantine 5 MG tablet Commonly known as:  NAMENDA Take 5 mg by mouth daily.   methotrexate 2.5 MG tablet Commonly known as:  RHEUMATREX Take 15 mg by mouth once a week.   mirtazapine 30 MG tablet Commonly known as:  REMERON Take 30 mg by mouth at bedtime. What changed:  Another medication with the same name was removed. Continue taking this medication, and follow the directions you see here.   ondansetron 4 MG tablet Commonly known as:  ZOFRAN Take 1 tablet (4 mg total) by mouth every 6 (six) hours as needed for nausea.   senna-docusate 8.6-50 MG tablet Commonly known as:  Senokot-S Take 1 tablet by mouth at bedtime as needed for mild constipation.   vitamin B-12 1000 MCG tablet Commonly known as:  CYANOCOBALAMIN Take 1,000 mcg by mouth daily.            Discharge Care Instructions  (From admission, onward)         Start     Ordered   12/22/17 0000  Weight bearing as tolerated     12/22/17 1542            Diet and Activity recommendation: See Discharge Instructions above   Consults obtained -  ortho   Major procedures and Radiology Reports - PLEASE review detailed and final reports for all details, in brief -     Dg Chest 1 View  Result Date: 12/21/2017 CLINICAL DATA:  Patient fell today while walking  in her house. Left hip and knee pain. EXAM: CHEST  1 VIEW COMPARISON:  Left shoulder radiographs 12/07/2017 FINDINGS: Redemonstration of subacute displaced overlapping fracture of the proximal left humerus involving the surgical neck. Osteoarthritis of the [facility] and both glenohumeral joints. Cardiomegaly with aortic atherosclerosis. No acute pulmonary consolidation or pneumothorax. No effusion. No acute displaced rib fracture. IMPRESSION: Cardiomegaly with aortic atherosclerosis. No active pulmonary disease. Known subacute left surgical neck fracture of the humerus with displaced humeral diaphysis as before. Electronically Signed   By: Tollie Eth M.D.   On: 12/21/2017 19:48   Pelvis Portable  Result Date: 12/22/2017 CLINICAL DATA:  Anterior approach left hip arthroplasty EXAM: PORTABLE PELVIS 1-2 VIEWS COMPARISON:  12/22/2017 intraoperative radiographs. FINDINGS: Postop change with soft tissue emphysema overlying the left hip is identified. Intact uncemented left total hip arthroplasty without complicating features or postoperative fracture. Degenerative disc disease and facet arthrosis of the included lower lumbar spine from L4 through S1. The bony pelvis appears intact with osteoarthritic change of the SI joints. Pubic symphysis is maintained. The native right hip joint is intact with minimal axial joint space narrowing noted. No fracture of the right hip. IMPRESSION: No immediate complications status post left total hip arthroplasty. Electronically Signed   By: Tollie Eth M.D.   On: 12/22/2017 17:43   Dg Shoulder Left  Result Date: 11/29/2017 CLINICAL DATA:  84 year old who tripped and fell on her porch this evening and injured the LEFT shoulder. Initial encounter. EXAM: LEFT SHOULDER - 2+ VIEW COMPARISON:  LEFT shoulder x-rays and MRI 04/12/2006. FINDINGS: Acute comminuted fracture involving the proximal LEFT humeral metaphysis. Avulsion fracture arising from the humeral head. Narrowed subacromial space.  Degenerative changes involving the acromioclavicular joint. Glenohumeral joint anatomically aligned with narrowing of the joint space. Generalized osseous demineralization. IMPRESSION: 1. Acute comminuted fracture involving the proximal LEFT humeral metaphysis. 2. Avulsion fracture arising from the RIGHT head. 3. Narrowed subacromial space indicating chronic supraspinatus tendon disease. 4. Osseous demineralization. 5. Osteoarthritis involving the glenohumeral joint and the AC joint. Electronically Signed   By: Hulan Saas M.D.   On: 11/29/2017 21:03   Dg Shoulder Left Port  Result Date: 12/22/2017 CLINICAL DATA:  Left shoulder fracture.  Follow-up. EXAM: LEFT SHOULDER - 1 VIEW COMPARISON:  11/29/2017 FINDINGS: Left humeral neck fracture again noted. There is anterior displacement on the Y-view with significant angulation. This has changed since prior study. No subluxation or dislocation. IMPRESSION: Increasing anterior displacement and angulation at the left humeral neck fracture. Electronically Signed   By: Charlett Nose M.D.   On: 12/22/2017 09:19   Dg C-arm 1-60 Min  Result Date: 12/22/2017 CLINICAL DATA:  LEFT hip fracture. EXAM: DG C-ARM 61-120 MIN; OPERATIVE LEFT HIP WITH PELVIS COMPARISON:  Hip films 12/21/2017. FINDINGS: LEFT total hip arthroplasty.  Satisfactory position and alignment. IMPRESSION: No adverse features. Electronically Signed   By: Elsie Stain M.D.   On: 12/22/2017 15:48   Dg Hip Operative Unilat W Or W/o Pelvis Left  Result Date: 12/22/2017 CLINICAL DATA:  LEFT hip fracture. EXAM: DG C-ARM 61-120 MIN; OPERATIVE LEFT HIP WITH PELVIS COMPARISON:  Hip films 12/21/2017. FINDINGS: LEFT total hip arthroplasty.  Satisfactory position and alignment. IMPRESSION: No adverse features. Electronically Signed   By: Elsie Stain M.D.   On: 12/22/2017 15:48   Dg Hip Unilat With Pelvis 2-3 Views Left  Result Date: 12/21/2017 CLINICAL DATA:  Fall.  Left hip pain. EXAM: DG HIP (WITH OR  WITHOUT PELVIS) 2-3V LEFT COMPARISON:  None. FINDINGS: Bones are diffusely demineralized. Frontal pelvis shows unremarkable SI joints and symphysis pubis. Left femoral neck fracture in subcapsular to transcervical location shows varus angulation. No worrisome lytic or sclerotic osseous abnormality. IMPRESSION: Left femoral neck fracture with varus angulation. Electronically Signed   By: Kennith Center M.D.   On: 12/21/2017 19:47   Dg Femur Min 2 Views Left  Result Date: 12/21/2017 CLINICAL DATA:  Fall.  Left hip and knee pain. EXAM: LEFT FEMUR 2 VIEWS COMPARISON:  None. FINDINGS: Two views study reviewed along with the left hip exam. Two-view exam of the left femur shows a femoral neck fracture with varus angulation. No worrisome lytic or sclerotic osseous abnormality. Advanced degenerative changes are evident at the knee with probable intra-articular loose body. IMPRESSION: 1. Femoral neck fracture with varus angulation. 2. Marked degenerative changes in the knee with intra-articular loose body. Electronically Signed   By: Kennith Center M.D.   On: 12/21/2017 19:45   Xr Humerus Left  Result Date: 12/21/2017 2 views of the left proximal humerus show a 2 part proximal humerus fracture with slight interval healing.  Xr Shoulder 1v Left  Result Date: 12/07/2017 Single view of the left shoulder shows a 2 part proximal humerus fracture that is displaced.   Micro Results    Recent Results (from the past 240 hour(s))  Surgical pcr screen     Status: Abnormal   Collection Time: 12/21/17 10:48 PM  Result Value Ref Range Status   MRSA, PCR NEGATIVE NEGATIVE Final   Staphylococcus aureus POSITIVE (A) NEGATIVE Final    Comment: (NOTE) The Xpert SA Assay (FDA approved for NASAL specimens in patients 85 years of age and older), is one component of a comprehensive surveillance program. It is not intended to diagnose infection nor to guide or monitor treatment. Performed at [facility], [address] [place], [place], [place] [address]        Today   Subjective:   Shalae Lamke today denies any complaints.   Objective:   Blood pressure 102/60, pulse 90, temperature 97.6 F (36.4 C), temperature source Oral, resp. rate 13, height 5' 5.98" (1.676 m), weight 81.6 kg, SpO2 98 %.   Intake/Output Summary (Last 24 hours) at 12/28/2017 1142 Last data filed at 12/28/2017 0413 Gross per 24 hour  Intake 840 ml  Output 900 ml  Net -60 ml    Exam Awake Alert, pleasantly confused, laying in bed in no apparent distress Symmetrical Chest wall movement, Good air movement bilaterally, CTAB RRR,No Gallops,Rubs or new Murmurs, No Parasternal Heave +ve B.Sounds, Abd Soft, Non tender, No rebound -guarding or rigidity. No Cyanosis, Clubbing or edema, No new Rash or bruise  Data Review   CBC w Diff:  Lab Results  Component Value Date   WBC 7.7 12/27/2017   HGB 9.9 (L) 12/27/2017   HCT 28.7 (L) 12/27/2017   PLT 354 12/27/2017   LYMPHOPCT 10 12/21/2017   MONOPCT 7 12/21/2017   EOSPCT 3 12/21/2017   BASOPCT 1 12/21/2017    CMP:  Lab Results  Component Value Date   NA 136 12/24/2017   K 3.9 12/24/2017   CL 101 12/24/2017   CO2 26 12/24/2017   BUN 15 12/24/2017   CREATININE 0.54 12/24/2017  .   Total Time in preparing paper work, data evaluation and todays exam - 35 minutes  Huey Bienenstock M.D on 12/28/2017 at 11:42 AM  [facility]   Office  [phone]

## 2018-01-04 ENCOUNTER — Telehealth (INDEPENDENT_AMBULATORY_CARE_PROVIDER_SITE_OTHER): Payer: Self-pay | Admitting: Orthopaedic Surgery

## 2018-01-04 NOTE — Telephone Encounter (Signed)
blackman

## 2018-01-04 NOTE — Telephone Encounter (Signed)
Connie Snyder, PT, from [employer] called requesting clarification on if she can do ROM exercises.  CB#[phone]

## 2018-01-04 NOTE — Telephone Encounter (Signed)
Gentle shoulder motion allowed.  No abduction of the shoulder.

## 2018-01-04 NOTE — Telephone Encounter (Signed)
Connie Snyder did her surgery, which was a hip replacement.  They should mainly work on balance and coordination for a hip.  They do not need to worry about range of motion.

## 2018-01-04 NOTE — Telephone Encounter (Signed)
Tried calling. No answer. LMVM advising per Dr Magnus Ivan.

## 2018-01-04 NOTE — Telephone Encounter (Signed)
Please advise. Thanks.  

## 2018-01-04 NOTE — Telephone Encounter (Signed)
Please advise. Thanks. Wanting to know clarification on ROM

## 2018-01-04 NOTE — Telephone Encounter (Signed)
Needs clarification on ROM to UE for fracture in arm.

## 2018-01-06 ENCOUNTER — Emergency Department (HOSPITAL_COMMUNITY): Payer: Medicare Other

## 2018-01-06 ENCOUNTER — Emergency Department (HOSPITAL_COMMUNITY)
Admission: EM | Admit: 2018-01-06 | Discharge: 2018-01-06 | Disposition: A | Discharge status: Home / Self Care | Payer: Medicare Other | Attending: Emergency Medicine | Admitting: Emergency Medicine

## 2018-01-06 ENCOUNTER — Other Ambulatory Visit: Payer: Self-pay

## 2018-01-06 DIAGNOSIS — X58XXXA Exposure to other specified factors, initial encounter: Secondary | ICD-10-CM | POA: Diagnosis not present

## 2018-01-06 DIAGNOSIS — Y939 Activity, unspecified: Secondary | ICD-10-CM | POA: Insufficient documentation

## 2018-01-06 DIAGNOSIS — Z96642 Presence of left artificial hip joint: Secondary | ICD-10-CM | POA: Insufficient documentation

## 2018-01-06 DIAGNOSIS — S42352A Displaced comminuted fracture of shaft of humerus, left arm, initial encounter for closed fracture: Secondary | ICD-10-CM | POA: Diagnosis present

## 2018-01-06 DIAGNOSIS — S42352D Displaced comminuted fracture of shaft of humerus, left arm, subsequent encounter for fracture with routine healing: Secondary | ICD-10-CM

## 2018-01-06 DIAGNOSIS — Z79899 Other long term (current) drug therapy: Secondary | ICD-10-CM | POA: Diagnosis not present

## 2018-01-06 DIAGNOSIS — Y999 Unspecified external cause status: Secondary | ICD-10-CM | POA: Insufficient documentation

## 2018-01-06 DIAGNOSIS — Y929 Unspecified place or not applicable: Secondary | ICD-10-CM | POA: Insufficient documentation

## 2018-01-06 NOTE — ED Provider Notes (Signed)
[facility] EMERGENCY DEPARTMENT Provider Note   CSN: [id number] Arrival date & time: 01/06/18  0041     History   Chief Complaint No chief complaint on file.   HPI Connie Snyder is a 84 y.o. female.  The history is provided by the patient and medical records.    84 year old female with history of arthritis, Sjogren's syndrome, presenting to the ED with concern of new left arm fracture.  Patient initially seen in the ED on 11/29/17, found to have comminuted left humeral fracture, treated with sling.  She did have some skin tenting at that time but conservative treatment pursued.  This was followed up during subsequent ED visit and admission on 12/21/17 due to hip fracture.  She has been at rehab facility since September recovering, sent in today due to concern of deformity of left arm.  Patient denies any new injury/trauma or falls.  She denies any current pain in her arm.  She is wearing sling as directed.    Past Medical History:  Diagnosis Date  . Arthritis   . Sjogren's disease [facility])     Patient Active Problem List   Diagnosis Date Noted  . Normocytic normochromic anemia 12/22/2017  . S/p left hip fracture 12/22/2017  . Malnutrition of moderate degree 12/22/2017  . Closed fracture of left hip (HCC) 12/21/2017  . Closed fracture of left proximal humerus 11/30/2017    Past Surgical History:  Procedure Laterality Date  . TOTAL HIP ARTHROPLASTY Left 12/22/2017   Procedure: TOTAL HIP ARTHROPLASTY ANTERIOR APPROACH;  Surgeon: Tarry Kos, MD;  Location: [facility] OR;  Service: Orthopedics;  Laterality: Left;     OB History   None      Home Medications    Prior to Admission medications   Medication Sig Start Date End Date Taking? Authorizing Provider  acetaminophen (TYLENOL) 325 MG tablet Take 650 mg by mouth every 8 (eight) hours as needed for mild pain.   Yes [provider]  aspirin EC 81 MG tablet Take 1 tablet (81 mg total) by mouth daily.  12/28/17  Yes Elgergawy, Leana Roe, MD  cholecalciferol (VITAMIN D) 1000 units tablet Take 1,000 Units by mouth daily.   Yes [provider]  docusate sodium (COLACE) 100 MG capsule Take 1 capsule (100 mg total) by mouth 2 (two) times daily. 12/28/17  Yes Elgergawy, Leana Roe, MD  donepezil (ARICEPT) 5 MG tablet Take 5 mg by mouth daily.  11/27/17  Yes [provider]  enoxaparin (LOVENOX) 40 MG/0.4ML injection Inject 0.4 mLs (40 mg total) into the skin daily for 22 days. 12/28/17 01/19/18 Yes Elgergawy, Leana Roe, MD  ferrous sulfate 325 (65 FE) MG tablet Take 325 mg by mouth daily with breakfast.   Yes [provider]  folic acid (FOLVITE) 400 MCG tablet Take 800 mcg by mouth daily.   Yes [provider]  memantine (NAMENDA) 5 MG tablet Take 5 mg by mouth daily. 11/20/17  Yes [provider]  methotrexate (RHEUMATREX) 2.5 MG tablet Take 2.5 mg by mouth daily.  11/13/17  Yes [provider]  mirtazapine (REMERON) 30 MG tablet Take 30 mg by mouth at bedtime.  11/01/17  Yes [provider]  ondansetron (ZOFRAN) 4 MG tablet Take 1 tablet (4 mg total) by mouth every 6 (six) hours as needed for nausea. 12/28/17  Yes Elgergawy, Leana Roe, MD  oxyCODONE-acetaminophen (PERCOCET/ROXICET) 5-325 MG tablet Take 1 tablet by mouth every 4 (four) hours as needed for  severe pain.   Yes [provider]  sertraline (ZOLOFT) 25 MG tablet Take 12.5 mg by mouth daily.   Yes [provider]  vitamin B-12 (CYANOCOBALAMIN) 1000 MCG tablet Take 1,000 mcg by mouth daily.   Yes [provider]  feeding supplement (BOOST HIGH PROTEIN) LIQD Take 1 Container by mouth 2 (two) times daily between meals.    [provider]  HYDROcodone-acetaminophen (NORCO) 7.5-325 MG tablet Take 1 tablet by mouth every 4 (four) hours as needed for severe pain (pain score 7-10). 12/28/17   Elgergawy, Leana Roe, MD  senna-docusate (SENOKOT-S) 8.6-50 MG tablet Take 1  tablet by mouth at bedtime as needed for mild constipation. 12/28/17   Elgergawy, Leana Roe, MD    Family History Family History  Family history unknown: Yes    Social History Social History   Tobacco Use  . Smoking status: Never Smoker  . Smokeless tobacco: Never Used  Substance Use Topics  . Alcohol use: Not on file  . Drug use: Not on file     Allergies   Patient has no known allergies.   Review of Systems Review of Systems  Musculoskeletal: Positive for arthralgias.  All other systems reviewed and are negative.    Physical Exam Updated Vital Signs BP 108/66   Pulse 71   Temp 98.5 F (36.9 C) (Oral)   Resp 16   SpO2 98%   Physical Exam  Constitutional: She is oriented to person, place, and time. She appears well-developed and well-nourished.  HENT:  Head: Normocephalic and atraumatic.  Mouth/Throat: Oropharynx is clear and moist.  Eyes: Pupils are equal, round, and reactive to light. Conjunctivae and EOM are normal.  Neck: Normal range of motion.  Cardiovascular: Normal rate, regular rhythm and normal heart sounds.  Pulmonary/Chest: Effort normal and breath sounds normal.  Abdominal: Soft. Bowel sounds are normal.  Musculoskeletal: Normal range of motion.  Patient wearing sling, left shoulder deformity noted without swelling or bruising; no skin tenting or compromise; states this has been present for a few weeks now; denies pain; arm is NVI without signs of acute trauma  Neurological: She is alert and oriented to person, place, and time.  Skin: Skin is warm and dry.  Psychiatric: She has a normal mood and affect.  Nursing note and vitals reviewed.    ED Treatments / Results  Labs (all labs ordered are listed, but only abnormal results are displayed) Labs Reviewed - No data to display  EKG None  Radiology Ct Shoulder Left Wo Contrast  Result Date: 01/06/2018 CLINICAL DATA:  Left shoulder fracture. EXAM: CT OF THE UPPER LEFT EXTREMITY WITHOUT  CONTRAST TECHNIQUE: Multidetector CT imaging of the upper left extremity was performed according to the standard protocol. COMPARISON:  Multiple prior radiographs most recently earlier this day. Radiographs dating back to 11/29/2017 reviewed. FINDINGS: Bones/Joint/Cartilage Significantly displaced left proximal humerus fracture involves the proximal shaft. There is 2.7 cm anterior displacement of dominant distal fracture fragment. There is approximately 15 mm osseous overriding. Some early callus formation seen at the fracture site. No intra-articular extension. Humeral head is normally located with moderate glenohumeral osteoarthritis. Mild acromioclavicular osteoarthritis. Ligaments Suboptimally assessed by CT. Muscles and Tendons No muscle atrophy or hematoma Soft tissues No soft tissue hematoma. IMPRESSION: Subacute displaced left proximal humeral shaft fracture with osseous distraction and overriding. Some incomplete callus formation but no solid bony bridging. Electronically Signed   By: Narda Rutherford M.D.   On: 01/06/2018 04:02   Dg Humerus Left  Result Date: 01/06/2018 CLINICAL DATA:  Pain after fall EXAM: LEFT HUMERUS - 2+ VIEW COMPARISON:  December 22, 2017 FINDINGS: Unknown fracture through the proximal humerus at the base of the humeral head is again identified. The transscapular Y-view is limited limiting evaluation for dislocation. That being said, I do not believe the humeral head is likely dislocated. The remainder of the humerus is intact. The proximal radius and ulna are intact. IMPRESSION: Known proximal humeral fracture as above. Evaluation for humeral head dislocation is limited but I do not believe there is a dislocation. No other humeral fractures. Electronically Signed   By: Gerome Sam III M.D   On: 01/06/2018 02:29    Procedures Procedures (including critical care time)  Medications Ordered in ED Medications - No data to display   Initial Impression / Assessment and  Plan / ED Course  I have reviewed the triage vital signs and the nursing notes.  Pertinent labs & imaging results that were available during my care of the patient were reviewed by me and considered in my medical decision making (see chart for details).  84 year old female here with left arm deformity.  Facility sent her in with concern of possible new injury.  There was no fall or trauma today reported.  Patient's initial injury occurred 11/29/2017, found to have comminuted humeral fracture at that time.  Has been followed by orthopedics with routine healing and angiogram.  On exam she continues to have deformity of the left shoulder but is not complaining of any pain.  Arm is NVI.  Imaging today without acute changes from prior.  Patient remains without acute complaints there.  Stable for discharge back to her facility.  She can return here for any new/acute changes.  Final Clinical Impressions(s) / ED Diagnoses   Final diagnoses:  Closed displaced comminuted fracture of shaft of left humerus with routine healing, subsequent encounter    ED Discharge Orders    None       Garlon Hatchet, PA-C 01/06/18 [id number]    Glynn Octave, MD 01/06/18 2309

## 2018-01-06 NOTE — ED Notes (Signed)
[facility] called for transport.  

## 2018-01-06 NOTE — ED Notes (Signed)
[facility] has arrived 

## 2018-01-06 NOTE — Discharge Instructions (Signed)
Fracture is old, no new injuries seen on films today. Follow-up with orthopedics as scheduled. Return here for any new/acute changes.

## 2018-01-06 NOTE — ED Notes (Signed)
Report called to [facility]  [facility] called to transport back there

## 2018-01-06 NOTE — ED Triage Notes (Signed)
Pt to ED via [facility] from [facility]. Per EMS pt is at facility for rehab after fall and hip fracture. During stay thus far, pt has only been doing leg exercises but no walking. Today they got pt up to walk and her left arm "went forward." X ray was done, showing a fracture and the provider at facility wanted pt sent out since they do not know if it is a new or old fracture. Deformity noted, pulses and sensation intact. Pt denies pain in the arm unless she's moving it. Hx dementia.

## 2018-01-18 ENCOUNTER — Encounter (INDEPENDENT_AMBULATORY_CARE_PROVIDER_SITE_OTHER): Payer: Self-pay | Admitting: Physician Assistant

## 2018-01-18 ENCOUNTER — Ambulatory Visit (INDEPENDENT_AMBULATORY_CARE_PROVIDER_SITE_OTHER): Payer: Medicare Other | Admitting: Physician Assistant

## 2018-01-18 ENCOUNTER — Ambulatory Visit (INDEPENDENT_AMBULATORY_CARE_PROVIDER_SITE_OTHER): Payer: Medicare Other

## 2018-01-18 DIAGNOSIS — M25512 Pain in left shoulder: Secondary | ICD-10-CM

## 2018-01-18 DIAGNOSIS — S42202D Unspecified fracture of upper end of left humerus, subsequent encounter for fracture with routine healing: Secondary | ICD-10-CM

## 2018-01-18 NOTE — Progress Notes (Signed)
HPI: Connie Snyder returns today several weeks 2 days status post left proximal humerus fracture.  She is overall trending towards improvement.  She is no longer using a sling.  Physical exam: Left shoulder she has good internal and external rotation without pain.  She has full forward flexion passively.  Animal tenderness over the proximal humerus.  Negative impingement.  Full sensation left hand to light touch.  Good range of motion the elbow without pain.  Radial pulse intact.  Radiographs 2 views left shoulder: Shows significant consolidation of the fracture.  The humeral heads well located.  Overall position alignment remain unchanged from previous films.  Impression: Proximal left humerus fracture  Plan: At this point time she can work on range of motion strengthening of the left shoulder with physical therapy.  She is activities as tolerated.  She will follow-up on as-needed basis.

## 2018-01-24 ENCOUNTER — Encounter (INDEPENDENT_AMBULATORY_CARE_PROVIDER_SITE_OTHER): Payer: Self-pay

## 2018-01-24 ENCOUNTER — Telehealth (INDEPENDENT_AMBULATORY_CARE_PROVIDER_SITE_OTHER): Payer: Self-pay | Admitting: Orthopaedic Surgery

## 2018-01-24 NOTE — Telephone Encounter (Signed)
She is PRN activities as tolerated

## 2018-01-24 NOTE — Telephone Encounter (Signed)
[facility] is requesting a fax stating that the patient is cleared from using the sling and needs no more appts unless symptoms worsen.  Fax # [phone]

## 2018-01-24 NOTE — Telephone Encounter (Signed)
Note faxed to provided number.

## 2018-01-24 NOTE — Telephone Encounter (Signed)
Making sure this sounds ok before I write the note

## 2018-03-13 ENCOUNTER — Ambulatory Visit (INDEPENDENT_AMBULATORY_CARE_PROVIDER_SITE_OTHER): Payer: Medicare Other | Admitting: Orthopaedic Surgery

## 2018-03-13 ENCOUNTER — Ambulatory Visit (INDEPENDENT_AMBULATORY_CARE_PROVIDER_SITE_OTHER): Payer: Self-pay

## 2018-03-13 DIAGNOSIS — S42202D Unspecified fracture of upper end of left humerus, subsequent encounter for fracture with routine healing: Secondary | ICD-10-CM

## 2018-03-13 DIAGNOSIS — S72002D Fracture of unspecified part of neck of left femur, subsequent encounter for closed fracture with routine healing: Secondary | ICD-10-CM | POA: Diagnosis not present

## 2018-03-13 DIAGNOSIS — M25552 Pain in left hip: Secondary | ICD-10-CM | POA: Diagnosis not present

## 2018-03-13 NOTE — Progress Notes (Signed)
Office Visit Note   Patient: Connie Snyder           Date of Birth: 1927/10/24           MRN: [id number] Visit Date: 03/13/2018              Requested by: No referring provider defined for this encounter. PCP: Patient, No Pcp Per   Assessment & Plan: Visit Diagnoses:  1. Pain in left hip   2. Closed fracture of proximal end of left humerus with routine healing, unspecified fracture morphology, subsequent encounter   3. Closed fracture of left hip with routine healing, subsequent encounter     Plan: Impression is 3 months status post left total hip replacement for femoral neck fracture as well as nonunion of left proximal humerus fracture.  X-rays reviewed today with the patient and her son.  The hip implant is stable and doing well.  I would like to see her back in 3 months for this.  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  There is significant displacement of the humeral head and the humeral shaft.  Unfortunate this will not go on to union.  Patient would need a reverse shoulder replacement if she is symptomatic but she seems to be compensating for this quite well at this point.  She should follow-up in 3 months with standing AP pelvis and lateral hip on return. Total face to face encounter time was greater than 25 minutes and over half of this time was spent in counseling and/or coordination of care.  Follow-Up Instructions: Return in about 3 months (around 06/12/2018).   Orders:  Orders Placed This Encounter  Procedures  . XR HIP UNILAT W OR W/O PELVIS 2-3 VIEWS LEFT  . XR Shoulder Left   No orders of the defined types were placed in this encounter.     Procedures: No procedures performed   Clinical Data: No additional findings.   Subjective: Chief Complaint  Patient presents with  . Left Hip - Follow-up    Connie Snyder is almost 3 months status post left total hip replacement for femoral neck fracture as well as a left  proximal humerus fracture treated nonoperatively.  She reports no pain in her left hip.  She reports no significant pain at rest in her left shoulder.  She does have a prominent area anterior to her shoulder and this is causing her difficulty with raising her arm.  She is walking with a walker.  Denies any numbness and tingling.  She presents today with her son.   Review of Systems  Constitutional: Negative.   HENT: Negative.   Eyes: Negative.   Respiratory: Negative.   Cardiovascular: Negative.   Endocrine: Negative.   Musculoskeletal: Negative.   Neurological: Negative.   Hematological: Negative.   Psychiatric/Behavioral: Negative.   All other systems reviewed and are negative.    Objective: Vital Signs: There were no vitals taken for this visit.  Physical Exam  Constitutional: She is oriented to person, place, and time. She appears well-developed and well-nourished.  Pulmonary/Chest: Effort normal.  Neurological: She is alert and oriented to person, place, and time.  Skin: Skin is warm. Capillary refill takes less than 2 seconds.  Psychiatric: She has a normal mood and affect. Her behavior is normal. Judgment and thought content normal.  Nursing note and vitals reviewed.   Ortho Exam Left hip exam shows a fully healed surgical scar.  Painless movement  of the left hip joint. Left shoulder exam shows a prominent humeral shaft underneath the skin.  There is no skin compromise or tenting or blanching.  She has difficulty with forward elevation. Specialty Comments:  No specialty comments available.  Imaging: Xr Hip Unilat W Or W/o Pelvis 2-3 Views Left  Result Date: 03/13/2018 Stable left total hip replacement without complication.  Xr Shoulder Left  Result Date: 03/13/2018 Nonunion of proximal humerus fracture with anterior and superior displacement of humeral shaft    PMFS History: Patient Active Problem List   Diagnosis Date Noted  . Normocytic normochromic anemia  12/22/2017  . S/p left hip fracture 12/22/2017  . Malnutrition of moderate degree 12/22/2017  . Closed fracture of left hip (HCC) 12/21/2017  . Closed fracture of left proximal humerus 11/30/2017   Past Medical History:  Diagnosis Date  . Arthritis   . Sjogren's disease (HCC)     Family History  Family history unknown: Yes    Past Surgical History:  Procedure Laterality Date  . TOTAL HIP ARTHROPLASTY Left 12/22/2017   Procedure: TOTAL HIP ARTHROPLASTY ANTERIOR APPROACH;  Surgeon: Tarry Kos, MD;  Location: [facility] OR;  Service: Orthopedics;  Laterality: Left;   Social History   Occupational History  . Not on file  Tobacco Use  . Smoking status: Never Smoker  . Smokeless tobacco: Never Used  Substance and Sexual Activity  . Alcohol use: Not on file  . Drug use: Not on file  . Sexual activity: Not on file

## 2018-03-15 ENCOUNTER — Telehealth (INDEPENDENT_AMBULATORY_CARE_PROVIDER_SITE_OTHER): Payer: Self-pay

## 2018-03-15 NOTE — Telephone Encounter (Signed)
Flossie Buffy with  [facility] called stating that they will be going to see patient on 03/19/2018 for HHPT.

## 2018-03-20 ENCOUNTER — Telehealth (INDEPENDENT_AMBULATORY_CARE_PROVIDER_SITE_OTHER): Payer: Self-pay

## 2018-03-20 NOTE — Telephone Encounter (Signed)
Connie Snyder with [facility] wanted to let Dr. Magnus Ivan know that they will start HHPT with patient today, 03/20/2018.

## 2018-03-21 ENCOUNTER — Telehealth (INDEPENDENT_AMBULATORY_CARE_PROVIDER_SITE_OTHER): Payer: Self-pay | Admitting: Orthopaedic Surgery

## 2018-03-21 NOTE — Telephone Encounter (Signed)
Patient called and requested refill for Hydocodone.  Please call patient to advise.  [phone]

## 2018-03-21 NOTE — Telephone Encounter (Signed)
See message.

## 2018-03-21 NOTE — Telephone Encounter (Signed)
30

## 2018-03-22 ENCOUNTER — Other Ambulatory Visit (INDEPENDENT_AMBULATORY_CARE_PROVIDER_SITE_OTHER): Payer: Self-pay

## 2018-03-22 ENCOUNTER — Telehealth (INDEPENDENT_AMBULATORY_CARE_PROVIDER_SITE_OTHER): Payer: Self-pay

## 2018-03-22 MED ORDER — HYDROCODONE-ACETAMINOPHEN 5-325 MG PO TABS: 1.0000 | ORAL_TABLET | Freq: Two times a day (BID) | ORAL | 0 refills | Status: DC | PRN

## 2018-03-22 NOTE — Telephone Encounter (Signed)
Flossie Buffy with [facility] called stating that Home Health services will start for patient today, 03/22/2018.

## 2018-03-23 NOTE — Telephone Encounter (Signed)
Can you please sign this since Roda Shutters is out of the office?

## 2018-03-26 NOTE — Telephone Encounter (Signed)
In data base pt received a percocet 5/325 # 40 rx from another doctor on 03/13/18 do you wish to refill? Will hold for Dr. Roda Shutters

## 2018-03-26 NOTE — Telephone Encounter (Signed)
Looks like she got an Rx on 03/22/18

## 2018-03-29 ENCOUNTER — Telehealth (INDEPENDENT_AMBULATORY_CARE_PROVIDER_SITE_OTHER): Payer: Self-pay | Admitting: Orthopaedic Surgery

## 2018-03-29 NOTE — Telephone Encounter (Signed)
Tried to call patient. Number is disconnected no longer in service.

## 2018-03-29 NOTE — Telephone Encounter (Signed)
Maryruth Hancock, Case Manager, with [facility] called to request VO for evaluation of [facility] PT starting in January for 1x a month.  She was out to see her today, but the patient wants her son to be there and he was not home today.  CB#[phone].  Thank you.

## 2018-03-29 NOTE — Telephone Encounter (Signed)
That will be fine. 

## 2018-03-29 NOTE — Telephone Encounter (Signed)
Please advise 

## 2018-03-29 NOTE — Telephone Encounter (Signed)
In that case, no we shouldn't refill it.  We need to ask her why she's getting it from multiple doctors

## 2018-03-29 NOTE — Telephone Encounter (Signed)
LMOM ok

## 2018-03-29 NOTE — Telephone Encounter (Signed)
See message below °

## 2018-03-30 ENCOUNTER — Telehealth (INDEPENDENT_AMBULATORY_CARE_PROVIDER_SITE_OTHER): Payer: Self-pay | Admitting: Orthopedic Surgery

## 2018-03-30 MED ORDER — OXYCODONE-ACETAMINOPHEN 5-325 MG PO TABS: 1.0000 | ORAL_TABLET | Freq: Every day | ORAL | 0 refills | Status: DC | PRN

## 2018-03-30 NOTE — Telephone Encounter (Signed)
Please let her know that I sent this in.  This medication will not be refilled on a regular basis b/c it's a narcotic.

## 2018-03-30 NOTE — Telephone Encounter (Signed)
Please see message below

## 2018-03-30 NOTE — Addendum Note (Signed)
Addended by: Mayra Reel on: 03/30/2018 05:04 PM   Modules accepted: Orders

## 2018-03-30 NOTE — Telephone Encounter (Signed)
[name] is calling to request a refill of pain medication for his mother, Connie Snyder. She has taken Percocet in the past.  Ms. Hostler uses the [facility] on [address].  His call back # is [phone].

## 2018-04-02 NOTE — Telephone Encounter (Signed)
Called, she was unavailable. I will retry again later.

## 2018-04-03 ENCOUNTER — Telehealth (INDEPENDENT_AMBULATORY_CARE_PROVIDER_SITE_OTHER): Payer: Self-pay

## 2018-04-03 NOTE — Telephone Encounter (Signed)
Tried calling patient to advise her of Dr. Warren Danes message.  Please see other message in patient's chart.

## 2018-04-03 NOTE — Telephone Encounter (Signed)
Tried calling patient to advise her of Dr. Warren Danes message, but her home number is disconnected and her cell phone is stating that she is unavailable.

## 2018-04-06 ENCOUNTER — Other Ambulatory Visit (INDEPENDENT_AMBULATORY_CARE_PROVIDER_SITE_OTHER): Payer: Self-pay | Admitting: Orthopaedic Surgery

## 2018-04-06 ENCOUNTER — Telehealth (INDEPENDENT_AMBULATORY_CARE_PROVIDER_SITE_OTHER): Payer: Self-pay | Admitting: Orthopaedic Surgery

## 2018-04-06 NOTE — Telephone Encounter (Signed)
See Xu's last note, he said pain meds would not be filled on a regular basis.  I would hold for him until next week.

## 2018-04-06 NOTE — Telephone Encounter (Signed)
Please advise 

## 2018-04-06 NOTE — Telephone Encounter (Signed)
Patient's family member left a voicemail message requesting an RX refill on her pain medication (4:42 p.m).  The CB#[phone].  Thank you.

## 2018-04-09 NOTE — Telephone Encounter (Signed)
Tramadol #30

## 2018-04-09 NOTE — Telephone Encounter (Signed)
Per Dr. Magnus Ivan, hold for Dr. Roda Shutters.

## 2018-04-09 NOTE — Telephone Encounter (Signed)
SEE MESSAGE BELOW:

## 2018-04-12 ENCOUNTER — Other Ambulatory Visit (INDEPENDENT_AMBULATORY_CARE_PROVIDER_SITE_OTHER): Payer: Self-pay

## 2018-04-12 MED ORDER — TRAMADOL HCL 50 MG PO TABS: 50.0000 mg | ORAL_TABLET | Freq: Two times a day (BID) | ORAL | 0 refills | Status: DC | PRN

## 2018-04-12 NOTE — Telephone Encounter (Signed)
Called into pharm  

## 2018-04-16 ENCOUNTER — Encounter (INDEPENDENT_AMBULATORY_CARE_PROVIDER_SITE_OTHER): Payer: Self-pay | Admitting: Physician Assistant

## 2018-04-16 ENCOUNTER — Ambulatory Visit (INDEPENDENT_AMBULATORY_CARE_PROVIDER_SITE_OTHER): Payer: Medicare Other | Admitting: Physician Assistant

## 2018-04-16 DIAGNOSIS — S42202K Unspecified fracture of upper end of left humerus, subsequent encounter for fracture with nonunion: Secondary | ICD-10-CM | POA: Diagnosis not present

## 2018-04-16 MED ORDER — HYDROCODONE-ACETAMINOPHEN 7.5-325 MG PO TABS: 1.0000 | ORAL_TABLET | ORAL | 0 refills | Status: AC | PRN

## 2018-04-16 NOTE — Progress Notes (Signed)
Office Visit Note   Patient: Connie Snyder           Date of Birth: 05/19/1927           MRN: [id number] Visit Date: 04/16/2018              Requested by: No referring provider defined for this encounter. PCP: Connie Snyder   Assessment & Plan: Visit Diagnoses:  1. 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  Therefore she is activities as tolerated with the arm.  They ask about a possible brace discussed this with Connie Snyder he and I felt that we could try Sarmiento brace to see if this would help some especially with tenting skin and keep the humeral shaft more in place.  In regards to pain medicines we will give her a small amount for nighttime pain only and will refer her to pain management.  She will follow-up with Connie Snyder as planned for her hip in approximately 2 months.efer to pain management.   Follow-Up Instructions: Return for Connie Snyder.   Orders:  No orders of the defined types were placed in this encounter.  No orders of the defined types were placed in this encounter.     Procedures: No procedures performed   Clinical Data: No additional findings.   Subjective: Chief Complaint  Patient presents with  . Left Shoulder - Follow-up    HPI Connie Snyder returns today follow-up of her left shoulder again she sustained a left proximal humerus fracture in August 2019.  She and her family have elected to treat this conservatively.  This however is gone on to a nonunion.  Patient does not want any type of surgical procedure.  Her son who accompanies her today states that his mother after her hip surgery had some cognitive issues and she is never returned to her baseline is  concerned about further surgery and further cognitive decline.  However they are wondering what she can expect as far as range of motion and if her condition will become worse in regards to left shoulder.  She does not really have any pain during the day and only pain at night.  They are asking  for a refill on pain medicines. Review of Systems See HPI  Objective: Vital Signs: There were no vitals taken for this visit.  Physical Exam Constitutional:      Appearance: She is not ill-appearing or diaphoretic.  Pulmonary:     Effort: Pulmonary effort is normal.  Neurological:     Mental Status: She is alert.  Psychiatric:        Behavior: Behavior is slowed. Behavior is not agitated or aggressive.     Ortho Exam She is able to forward flex the humerus but the proximal end of the shaft actually tents the skin.  There is no breakdown of the skin where the shaft often tents skin.  She has good range of motion of the elbow forearm without pain. Specialty Comments:  No specialty comments available.  Imaging: No results found.   PMFS History: Patient Active Problem List   Diagnosis Date Noted  . Normocytic normochromic anemia 12/22/2017  . S/p left hip fracture 12/22/2017  .  Malnutrition of moderate degree 12/22/2017  . Closed fracture of left hip (HCC) 12/21/2017  . Closed fracture of left proximal humerus 11/30/2017   Past Medical History:  Diagnosis Date  . Arthritis   . Sjogren's disease (HCC)     Family History  Family history unknown: Yes    Past Surgical History:  Procedure Laterality Date  . TOTAL HIP ARTHROPLASTY Left 12/22/2017   Procedure: TOTAL HIP ARTHROPLASTY ANTERIOR APPROACH;  Surgeon: Connie Kos, MD;  Location: [facility] OR;  Service: Orthopedics;  Laterality: Left;   Social History   Occupational History  . Not on file  Tobacco Use  . Smoking status: Never Smoker  . Smokeless tobacco: Never Used  Substance and Sexual Activity  . Alcohol use: Not on file    . Drug use: Not on file  . Sexual activity: Not on file

## 2018-04-17 ENCOUNTER — Other Ambulatory Visit (INDEPENDENT_AMBULATORY_CARE_PROVIDER_SITE_OTHER): Payer: Self-pay

## 2018-04-17 DIAGNOSIS — S42202K Unspecified fracture of upper end of left humerus, subsequent encounter for fracture with nonunion: Secondary | ICD-10-CM

## 2018-04-26 ENCOUNTER — Telehealth (INDEPENDENT_AMBULATORY_CARE_PROVIDER_SITE_OTHER): Payer: Self-pay | Admitting: Orthopaedic Surgery

## 2018-04-26 NOTE — Telephone Encounter (Signed)
Prescription  [facility]  [address] Dr[address] Pearline Cables   [phone]   Patient son came into the office to request a prescription for him  3 in 1 for using the bathroom

## 2018-04-27 NOTE — Telephone Encounter (Signed)
Will bring to you

## 2018-04-27 NOTE — Telephone Encounter (Signed)
See message.

## 2018-04-27 NOTE — Telephone Encounter (Signed)
FAXED TO [facility] TO FAX NUMBER [phone]

## 2018-05-14 ENCOUNTER — Ambulatory Visit (INDEPENDENT_AMBULATORY_CARE_PROVIDER_SITE_OTHER): Payer: Medicare Other | Admitting: Physician Assistant

## 2018-06-12 ENCOUNTER — Ambulatory Visit (INDEPENDENT_AMBULATORY_CARE_PROVIDER_SITE_OTHER): Payer: Medicare Other | Admitting: Orthopaedic Surgery

## 2018-06-22 ENCOUNTER — Telehealth (INDEPENDENT_AMBULATORY_CARE_PROVIDER_SITE_OTHER): Payer: Self-pay | Admitting: Physician Assistant

## 2018-06-22 NOTE — Telephone Encounter (Signed)
Can you please advise?

## 2018-06-22 NOTE — Telephone Encounter (Signed)
Pt son called in said his mother needs a refill of Hydrocodone but the pharmacy told them to contact the provider who prescribed it.  [phone]

## 2018-06-23 MED ORDER — HYDROCODONE-ACETAMINOPHEN 5-325 MG PO TABS: 1.0000 | ORAL_TABLET | Freq: Four times a day (QID) | ORAL | 0 refills | Status: DC | PRN

## 2018-06-23 NOTE — Telephone Encounter (Signed)
I sent in just one more prescription for this medication.  If can not be refilled after this point.

## 2018-06-25 ENCOUNTER — Telehealth (INDEPENDENT_AMBULATORY_CARE_PROVIDER_SITE_OTHER): Payer: Self-pay

## 2018-06-25 NOTE — Telephone Encounter (Signed)
  Called patient. No answer Not receiving calls. If they return call, please ask them screening questions below. Thank you.   Do you have now or have you had in the past 7 days a fever and/or chills?   Do you have now or have you had in the past 7 days a cough?   Do you have now or have you had in the last 7 days nausea, vomiting or abdominal pain?   Have you been exposed to anyone who has tested positive for COVID-19?   Have you or anyone who lives with you traveled within the last month?

## 2018-06-25 NOTE — Telephone Encounter (Signed)
I called Connie Snyder and advised.

## 2018-06-26 ENCOUNTER — Ambulatory Visit (INDEPENDENT_AMBULATORY_CARE_PROVIDER_SITE_OTHER): Payer: Medicare Other | Admitting: Orthopaedic Surgery

## 2018-06-26 ENCOUNTER — Encounter (INDEPENDENT_AMBULATORY_CARE_PROVIDER_SITE_OTHER): Payer: Self-pay | Admitting: Orthopaedic Surgery

## 2018-06-26 ENCOUNTER — Other Ambulatory Visit: Payer: Self-pay

## 2018-06-26 ENCOUNTER — Ambulatory Visit (INDEPENDENT_AMBULATORY_CARE_PROVIDER_SITE_OTHER): Payer: Medicare Other

## 2018-06-26 DIAGNOSIS — Z96642 Presence of left artificial hip joint: Secondary | ICD-10-CM | POA: Diagnosis not present

## 2018-06-26 DIAGNOSIS — S42202K Unspecified fracture of upper end of left humerus, subsequent encounter for fracture with nonunion: Secondary | ICD-10-CM | POA: Diagnosis not present

## 2018-06-26 DIAGNOSIS — M25552 Pain in left hip: Secondary | ICD-10-CM

## 2018-06-26 MED ORDER — METHYLPREDNISOLONE 4 MG PO TBPK: ORAL_TABLET | ORAL | 0 refills | Status: DC

## 2018-06-26 MED ORDER — BACLOFEN 10 MG PO TABS: 5.0000 mg | ORAL_TABLET | Freq: Three times a day (TID) | ORAL | 2 refills | Status: DC | PRN

## 2018-06-26 MED ORDER — HYDROCODONE-ACETAMINOPHEN 5-325 MG PO TABS: 1.0000 | ORAL_TABLET | Freq: Every evening | ORAL | 0 refills | Status: DC | PRN

## 2018-06-26 NOTE — Progress Notes (Signed)
Office Visit Note   Patient: Connie Snyder           Date of Birth: September 02, 1927           MRN: [id number] Visit Date: 06/26/2018              Requested by: No referring provider defined for this encounter. PCP: Patient, No Pcp Per   Assessment & Plan: Visit Diagnoses:  1. 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 Pain in left hip     Plan: Impression is left hip pain likely abductor tendinosis with likely component from the back.  Prescription for hydrocodone, Medrol Dosepak, baclofen plus heat and rest.  Questions encouraged and answered.  In terms of the left proximal humerus nonunion she and her son both understand that the only way to correct the deformity and function would be to perform a reverse shoulder replacement which they are not interested in.  All questions answered to her satisfaction today.  Follow-up as needed. Total face to face encounter time was greater than 25 minutes and over half of this time was spent in counseling and/or coordination of care.  Follow-Up Instructions: Return if symptoms worsen or fail to improve.   Orders:  Orders Placed This Encounter  Procedures  . XR HIP UNILAT W OR W/O PELVIS 2-3 VIEWS LEFT  . XR Shoulder Left   Meds ordered this encounter  Medications  . HYDROcodone-acetaminophen (NORCO) 5-325 MG tablet    Sig: Take 1 tablet by mouth at bedtime as needed.    Dispense:  30 tablet    Refill:  0  . methylPREDNISolone (MEDROL DOSEPAK) 4 MG TBPK tablet    Sig: Use as directed    Dispense:  21 tablet    Refill:  0  . baclofen (LIORESAL) 10 MG tablet    Sig: Take 0.5 tablets (5 mg total) by mouth 3 (three) times daily as needed for muscle spasms.    Dispense:  30 each    Refill:  2      Procedures: No procedures performed   Clinical Data: No additional findings.   Subjective: Chief Complaint  Patient presents with  . Left Hip - Pain  .  Left Shoulder - Pain    Connie Snyder is a 85 year old female who is 6 months status post left total hip replacement for femoral neck fracture that I did.  She comes in for left hip pain.  She is also here for evaluation of her proximal humerus fracture.  In terms of the hip she endorses pain in the lower back and lateral hip region that radiates down into the thigh.  Her son states that she did a lot of walking on Sunday and left hip felt like it locked up.  Hydrocodone does help.  She has not had any definite trauma or injuries.  She also has questions regarding her left proximal humerus fracture that resulted in a nonunion.   Review of Systems  Constitutional: Negative.   HENT: Negative.   Eyes: Negative.   Respiratory: Negative.   Cardiovascular: Negative.   Endocrine: Negative.   Musculoskeletal: Negative.   Neurological: Negative.   Hematological: Negative.   Psychiatric/Behavioral: Negative.   All other systems reviewed and are negative.    Objective: Vital Signs: There were no vitals taken for this visit.  Physical Exam Vitals signs and nursing note reviewed.  Constitutional:      Appearance: She is  well-developed.  Pulmonary:     Effort: Pulmonary effort is normal.  Skin:    General: Skin is warm.     Capillary Refill: Capillary refill takes less than 2 seconds.  Neurological:     Mental Status: She is alert and oriented to person, place, and time.  Psychiatric:        Behavior: Behavior normal.        Thought Content: Thought content normal.        Judgment: Judgment normal.     Ortho Exam Left hip exam shows no pain with internal or external rotation.  She her surgical scar is fully healed.  She has mild tenderness of the trochanteric bursa.  Lower back is slightly tender to palpation.  No focal motor or sensory deficits. Left shoulder exam shows palpable fracture site with prominence of a humeral shaft.  There is no skin compromise. Specialty Comments:  No specialty  comments available.  Imaging: Xr Hip Unilat W Or W/o Pelvis 2-3 Views Left  Result Date: 06/26/2018 No complications to the left total hip replacement.  Small ossification within the abductors.  Xr Shoulder Left  Result Date: 06/26/2018 Chronic nonunion of proximal humerus fracture    PMFS History: Patient Active Problem List   Diagnosis Date Noted  . Normocytic normochromic anemia 12/22/2017  . S/p left hip fracture 12/22/2017  . Malnutrition of moderate degree 12/22/2017  . Closed fracture of left hip (HCC) 12/21/2017  . Closed fracture of left proximal humerus 11/30/2017   Past Medical History:  Diagnosis Date  . Arthritis   . Sjogren's disease (HCC)     Family History  Family history unknown: Yes    Past Surgical History:  Procedure Laterality Date  . TOTAL HIP ARTHROPLASTY Left 12/22/2017   Procedure: TOTAL HIP ARTHROPLASTY ANTERIOR APPROACH;  Surgeon: Tarry KosXu,  M, MD;  Location: [facility] OR;  Service: Orthopedics;  Laterality: Left;   Social History   Occupational History  . Not on file  Tobacco Use  . Smoking status: Never Smoker  . Smokeless tobacco: Never Used  Substance and Sexual Activity  . Alcohol use: Not on file  . Drug use: Not on file  . Sexual activity: Not on file

## 2018-11-07 ENCOUNTER — Observation Stay (HOSPITAL_BASED_OUTPATIENT_CLINIC_OR_DEPARTMENT_OTHER)
Admission: EM | Admit: 2018-11-07 | Discharge: 2018-11-09 | Disposition: A | Discharge status: Home / Self Care | Payer: Medicare Other | Attending: Internal Medicine | Admitting: Internal Medicine

## 2018-11-07 ENCOUNTER — Encounter (HOSPITAL_BASED_OUTPATIENT_CLINIC_OR_DEPARTMENT_OTHER): Payer: Self-pay

## 2018-11-07 ENCOUNTER — Emergency Department (HOSPITAL_BASED_OUTPATIENT_CLINIC_OR_DEPARTMENT_OTHER): Payer: Medicare Other

## 2018-11-07 ENCOUNTER — Other Ambulatory Visit: Payer: Self-pay

## 2018-11-07 DIAGNOSIS — R0600 Dyspnea, unspecified: Secondary | ICD-10-CM | POA: Insufficient documentation

## 2018-11-07 DIAGNOSIS — R4182 Altered mental status, unspecified: Secondary | ICD-10-CM | POA: Diagnosis not present

## 2018-11-07 DIAGNOSIS — Z96642 Presence of left artificial hip joint: Secondary | ICD-10-CM | POA: Diagnosis not present

## 2018-11-07 DIAGNOSIS — Z7982 Long term (current) use of aspirin: Secondary | ICD-10-CM | POA: Diagnosis not present

## 2018-11-07 DIAGNOSIS — Z791 Long term (current) use of non-steroidal anti-inflammatories (NSAID): Secondary | ICD-10-CM | POA: Insufficient documentation

## 2018-11-07 DIAGNOSIS — R0602 Shortness of breath: Secondary | ICD-10-CM

## 2018-11-07 DIAGNOSIS — Z20828 Contact with and (suspected) exposure to other viral communicable diseases: Secondary | ICD-10-CM | POA: Diagnosis not present

## 2018-11-07 DIAGNOSIS — R064 Hyperventilation: Secondary | ICD-10-CM | POA: Insufficient documentation

## 2018-11-07 DIAGNOSIS — F0390 Unspecified dementia without behavioral disturbance: Secondary | ICD-10-CM | POA: Diagnosis not present

## 2018-11-07 DIAGNOSIS — R443 Hallucinations, unspecified: Secondary | ICD-10-CM | POA: Diagnosis not present

## 2018-11-07 DIAGNOSIS — Z7901 Long term (current) use of anticoagulants: Secondary | ICD-10-CM | POA: Insufficient documentation

## 2018-11-07 DIAGNOSIS — Z79899 Other long term (current) drug therapy: Secondary | ICD-10-CM | POA: Insufficient documentation

## 2018-11-07 DIAGNOSIS — Z87891 Personal history of nicotine dependence: Secondary | ICD-10-CM | POA: Insufficient documentation

## 2018-11-07 DIAGNOSIS — E042 Nontoxic multinodular goiter: Secondary | ICD-10-CM | POA: Diagnosis not present

## 2018-11-07 DIAGNOSIS — M1990 Unspecified osteoarthritis, unspecified site: Secondary | ICD-10-CM | POA: Diagnosis not present

## 2018-11-07 DIAGNOSIS — R410 Disorientation, unspecified: Secondary | ICD-10-CM

## 2018-11-07 DIAGNOSIS — M3500 Sicca syndrome, unspecified: Secondary | ICD-10-CM | POA: Insufficient documentation

## 2018-11-07 DIAGNOSIS — F0150 Vascular dementia without behavioral disturbance: Secondary | ICD-10-CM

## 2018-11-07 LAB — CBC WITH DIFFERENTIAL/PLATELET
Abs Immature Granulocytes: 0.02 10*3/uL (ref 0.00–0.07)
Basophils Absolute: 0 10*3/uL (ref 0.0–0.1)
Basophils Relative: 0 %
Eosinophils Absolute: 0.2 10*3/uL (ref 0.0–0.5)
Eosinophils Relative: 3 %
HCT: 36 % (ref 36.0–46.0)
Hemoglobin: 12.7 g/dL (ref 12.0–15.0)
Immature Granulocytes: 0 %
Lymphocytes Relative: 18 %
Lymphs Abs: 1.3 10*3/uL (ref 0.7–4.0)
MCH: 29.2 pg (ref 26.0–34.0)
MCHC: 35.3 g/dL (ref 30.0–36.0)
MCV: 82.8 fL (ref 80.0–100.0)
Monocytes Absolute: 0.5 10*3/uL (ref 0.1–1.0)
Monocytes Relative: 8 %
Neutro Abs: 5 10*3/uL (ref 1.7–7.7)
Neutrophils Relative %: 71 %
Platelets: 288 10*3/uL (ref 150–400)
RBC: 4.35 MIL/uL (ref 3.87–5.11)
RDW: 14.6 % (ref 11.5–15.5)
WBC: 7 10*3/uL (ref 4.0–10.5)
nRBC: 0 % (ref 0.0–0.2)

## 2018-11-07 LAB — BASIC METABOLIC PANEL
Anion gap: 11 (ref 5–15)
BUN: 20 mg/dL (ref 8–23)
CO2: 20 mmol/L — ABNORMAL LOW (ref 22–32)
Calcium: 9.6 mg/dL (ref 8.9–10.3)
Chloride: 101 mmol/L (ref 98–111)
Creatinine, Ser: 0.63 mg/dL (ref 0.44–1.00)
GFR calc Af Amer: >60 mL/min (ref >60)
GFR calc non Af Amer: >60 mL/min (ref >60)
Glucose, Bld: 98 mg/dL (ref 70–99)
Potassium: 4.2 mmol/L (ref 3.5–5.1)
Sodium: 132 mmol/L — ABNORMAL LOW (ref 135–145)

## 2018-11-07 LAB — URINALYSIS, ROUTINE W REFLEX MICROSCOPIC
Bilirubin Urine: NEGATIVE
Glucose, UA: NEGATIVE mg/dL
Hgb urine dipstick: NEGATIVE
Ketones, ur: NEGATIVE mg/dL
Leukocytes,Ua: NEGATIVE
Nitrite: NEGATIVE
Protein, ur: NEGATIVE mg/dL
Specific Gravity, Urine: 1.01 (ref 1.005–1.030)
pH: 7 (ref 5.0–8.0)

## 2018-11-07 NOTE — ED Notes (Signed)
Son at bedside.

## 2018-11-07 NOTE — ED Notes (Signed)
Attempted IV in right forearm- unsuccessful

## 2018-11-07 NOTE — ED Notes (Signed)
Patient transported to CT 

## 2018-11-07 NOTE — ED Notes (Signed)
Unable to obtain an IV at this time. Attempt x 3 to R and L arms.

## 2018-11-07 NOTE — ED Notes (Signed)
Pt hyperventilating in room- Pt able to be redirected to take deep breaths. Pt continues to look at clock and ask is it AM or PM. Pt spelling B-[id number]-a-t-h-e repeatedly. Pt denies pain. Pt unable to explain situation. Pt alert to self.

## 2018-11-07 NOTE — ED Triage Notes (Addendum)
Per son he left home to go to the gym ~7pm-pt was baseline-when he came home ~10pm-pt was "panting and hallucinating"-states she had slurred speech and left arm hurting/chronic since fx last year-he reports pt with dx anxiety with no new meds-pt alert to name-denies pain-began hyperventilating when son started telling events-to triage then tx area in w/c

## 2018-11-08 ENCOUNTER — Observation Stay (HOSPITAL_COMMUNITY): Payer: Medicare Other

## 2018-11-08 ENCOUNTER — Emergency Department (HOSPITAL_BASED_OUTPATIENT_CLINIC_OR_DEPARTMENT_OTHER): Payer: Medicare Other

## 2018-11-08 ENCOUNTER — Encounter (HOSPITAL_BASED_OUTPATIENT_CLINIC_OR_DEPARTMENT_OTHER): Payer: Self-pay | Admitting: Emergency Medicine

## 2018-11-08 DIAGNOSIS — R0600 Dyspnea, unspecified: Secondary | ICD-10-CM | POA: Diagnosis not present

## 2018-11-08 DIAGNOSIS — R410 Disorientation, unspecified: Secondary | ICD-10-CM | POA: Diagnosis present

## 2018-11-08 DIAGNOSIS — E042 Nontoxic multinodular goiter: Secondary | ICD-10-CM | POA: Diagnosis not present

## 2018-11-08 DIAGNOSIS — R4182 Altered mental status, unspecified: Secondary | ICD-10-CM | POA: Diagnosis present

## 2018-11-08 DIAGNOSIS — F0150 Vascular dementia without behavioral disturbance: Secondary | ICD-10-CM | POA: Diagnosis not present

## 2018-11-08 DIAGNOSIS — R0602 Shortness of breath: Secondary | ICD-10-CM | POA: Diagnosis present

## 2018-11-08 DIAGNOSIS — R064 Hyperventilation: Secondary | ICD-10-CM | POA: Diagnosis not present

## 2018-11-08 DIAGNOSIS — M3500 Sicca syndrome, unspecified: Secondary | ICD-10-CM | POA: Diagnosis not present

## 2018-11-08 DIAGNOSIS — Z791 Long term (current) use of non-steroidal anti-inflammatories (NSAID): Secondary | ICD-10-CM | POA: Diagnosis not present

## 2018-11-08 DIAGNOSIS — M1990 Unspecified osteoarthritis, unspecified site: Secondary | ICD-10-CM | POA: Diagnosis not present

## 2018-11-08 DIAGNOSIS — Z96642 Presence of left artificial hip joint: Secondary | ICD-10-CM | POA: Diagnosis not present

## 2018-11-08 DIAGNOSIS — Z79899 Other long term (current) drug therapy: Secondary | ICD-10-CM | POA: Diagnosis not present

## 2018-11-08 DIAGNOSIS — F0390 Unspecified dementia without behavioral disturbance: Secondary | ICD-10-CM | POA: Diagnosis not present

## 2018-11-08 DIAGNOSIS — Z7982 Long term (current) use of aspirin: Secondary | ICD-10-CM | POA: Diagnosis not present

## 2018-11-08 DIAGNOSIS — Z7901 Long term (current) use of anticoagulants: Secondary | ICD-10-CM | POA: Diagnosis not present

## 2018-11-08 DIAGNOSIS — R443 Hallucinations, unspecified: Secondary | ICD-10-CM | POA: Diagnosis not present

## 2018-11-08 DIAGNOSIS — Z87891 Personal history of nicotine dependence: Secondary | ICD-10-CM | POA: Diagnosis not present

## 2018-11-08 DIAGNOSIS — Z20828 Contact with and (suspected) exposure to other viral communicable diseases: Secondary | ICD-10-CM | POA: Diagnosis not present

## 2018-11-08 LAB — COMPREHENSIVE METABOLIC PANEL
ALT: 31 U/L (ref 0–44)
AST: 35 U/L (ref 15–41)
Albumin: 3.6 g/dL (ref 3.5–5.0)
Alkaline Phosphatase: 81 U/L (ref 38–126)
Anion gap: 13 (ref 5–15)
BUN: 11 mg/dL (ref 8–23)
CO2: 23 mmol/L (ref 22–32)
Calcium: 9.3 mg/dL (ref 8.9–10.3)
Chloride: 104 mmol/L (ref 98–111)
Creatinine, Ser: 0.53 mg/dL (ref 0.44–1.00)
GFR calc Af Amer: >60 mL/min (ref >60)
GFR calc non Af Amer: >60 mL/min (ref >60)
Glucose, Bld: 100 mg/dL — ABNORMAL HIGH (ref 70–99)
Potassium: 3.9 mmol/L (ref 3.5–5.1)
Sodium: 140 mmol/L (ref 135–145)
Total Bilirubin: 0.4 mg/dL (ref 0.3–1.2)
Total Protein: 7.2 g/dL (ref 6.5–8.1)

## 2018-11-08 LAB — RAPID URINE DRUG SCREEN, HOSP PERFORMED
Amphetamines: NOT DETECTED
Barbiturates: NOT DETECTED
Benzodiazepines: NOT DETECTED
Cocaine: NOT DETECTED
Opiates: NOT DETECTED
Tetrahydrocannabinol: NOT DETECTED

## 2018-11-08 LAB — MAGNESIUM: Magnesium: 1.8 mg/dL (ref 1.7–2.4)

## 2018-11-08 LAB — AMMONIA: Ammonia: <9 umol/L — ABNORMAL LOW (ref 9–35)

## 2018-11-08 LAB — TSH: TSH: 1.582 u[IU]/mL (ref 0.350–4.500)

## 2018-11-08 LAB — TROPONIN I (HIGH SENSITIVITY)
Troponin I (High Sensitivity): 5 ng/L (ref <18)
Troponin I (High Sensitivity): 6 ng/L (ref <18)

## 2018-11-08 LAB — SARS CORONAVIRUS 2 BY RT PCR (HOSPITAL ORDER, PERFORMED IN ~~LOC~~ HOSPITAL LAB): SARS Coronavirus 2: NEGATIVE

## 2018-11-08 LAB — D-DIMER, QUANTITATIVE: D-Dimer, Quant: 14.7 ug/mL-FEU — ABNORMAL HIGH (ref 0.00–0.50)

## 2018-11-08 MED ORDER — VITAMIN D3 25 MCG (1000 UNIT) PO TABS
1000.0000 [IU] | ORAL_TABLET | Freq: Every day | ORAL | Status: DC
  Administered 2018-11-09: 1000 [IU] via ORAL
  Filled 2018-11-08: qty 1

## 2018-11-08 MED ORDER — IOHEXOL 350 MG/ML SOLN
100.0000 mL | Freq: Once | INTRAVENOUS | Status: DC | PRN
  Administered 2018-11-08: 100 mL via INTRAVENOUS

## 2018-11-08 MED ORDER — CYANOCOBALAMIN 250 MCG PO TABS
500.0000 ug | ORAL_TABLET | Freq: Every day | ORAL | Status: DC
  Administered 2018-11-09: 500 ug via ORAL
  Filled 2018-11-08: qty 2

## 2018-11-08 MED ORDER — BOOST HIGH PROTEIN PO LIQD
1.0000 | Freq: Every day | ORAL | Status: DC
  Filled 2018-11-08: qty 237

## 2018-11-08 MED ORDER — LORAZEPAM 0.5 MG PO TABS
0.5000 mg | ORAL_TABLET | ORAL | Status: AC
  Administered 2018-11-08: 0.5 mg via ORAL
  Filled 2018-11-08: qty 1

## 2018-11-08 MED ORDER — ADULT MULTIVITAMIN W/MINERALS CH
1.0000 | ORAL_TABLET | Freq: Every day | ORAL | Status: DC
  Administered 2018-11-09: 1 via ORAL
  Filled 2018-11-08: qty 1

## 2018-11-08 MED ORDER — FOLIC ACID-VIT B6-VIT B12 2.5-25-1 MG PO TABS: 1.0000 | ORAL_TABLET | Freq: Every day | ORAL | Status: DC

## 2018-11-08 MED ORDER — CELECOXIB 200 MG PO CAPS
200.0000 mg | ORAL_CAPSULE | Freq: Every day | ORAL | Status: DC
  Administered 2018-11-09: 200 mg via ORAL
  Filled 2018-11-08: qty 1

## 2018-11-08 MED ORDER — VITAMIN B-6 50 MG PO TABS
50.0000 mg | ORAL_TABLET | Freq: Every day | ORAL | Status: DC
  Administered 2018-11-09: 50 mg via ORAL
  Filled 2018-11-08: qty 1

## 2018-11-08 MED ORDER — FOLIC ACID 1 MG PO TABS
1.0000 mg | ORAL_TABLET | Freq: Every day | ORAL | Status: DC
  Administered 2018-11-09: 1 mg via ORAL
  Filled 2018-11-08: qty 1

## 2018-11-08 MED ORDER — FERROUS SULFATE 325 (65 FE) MG PO TABS
325.0000 mg | ORAL_TABLET | Freq: Every day | ORAL | Status: DC
  Administered 2018-11-09: 325 mg via ORAL
  Filled 2018-11-08: qty 1

## 2018-11-08 MED ORDER — MIRTAZAPINE 15 MG PO TABS
30.0000 mg | ORAL_TABLET | Freq: Every day | ORAL | Status: DC
  Administered 2018-11-08: 30 mg via ORAL
  Filled 2018-11-08: qty 2

## 2018-11-08 MED ORDER — ENOXAPARIN SODIUM 40 MG/0.4ML ~~LOC~~ SOLN
40.0000 mg | SUBCUTANEOUS | Status: DC
  Administered 2018-11-08: 40 mg via SUBCUTANEOUS
  Filled 2018-11-08: qty 0.4

## 2018-11-08 MED ORDER — ACETAMINOPHEN 500 MG PO TABS
1000.0000 mg | ORAL_TABLET | Freq: Once | ORAL | Status: AC
  Administered 2018-11-08: 1000 mg via ORAL
  Filled 2018-11-08: qty 2

## 2018-11-08 MED ORDER — METHOTREXATE 2.5 MG PO TABS: 10.0000 mg | ORAL_TABLET | ORAL | Status: DC

## 2018-11-08 MED ORDER — SENNOSIDES-DOCUSATE SODIUM 8.6-50 MG PO TABS: 1.0000 | ORAL_TABLET | Freq: Every evening | ORAL | Status: DC | PRN

## 2018-11-08 NOTE — ED Notes (Signed)
Pt found to have dislodged her IVs in her sleep.  Restarted in right wrist.  [facility] here for transport to [facility].

## 2018-11-08 NOTE — ED Notes (Signed)
Pts son requested that he be able to give patient her evening anxiety meds. RN stated that he would need to check with EDP. RN asked EDP but EDP needed to know the medication and dose. RN went into to ask and he stated he already gave her the medication, he thought it was ok. The patient received her prescribed 30mg  Mirtazapine. EDP made aware

## 2018-11-08 NOTE — ED Notes (Signed)
Repeated EKG due to artifact- delay in export due to computer issues.

## 2018-11-08 NOTE — ED Notes (Signed)
Report called to [facility] for transport.  ETA [phone]

## 2018-11-08 NOTE — ED Notes (Signed)
Call to [facility] to give report.  Receiving nurse unavailable for report at this time.  Number left for call back.

## 2018-11-08 NOTE — ED Notes (Signed)
Beverely Low [phone] (son)

## 2018-11-08 NOTE — ED Notes (Signed)
purewick applied to pt

## 2018-11-08 NOTE — ED Notes (Signed)
Son stated that he was going to leave for the evening. RN took down contact information to update when his mother gets a bed and leaves to go to the other facility. Son mentioned that we could leave a message because he has intermittent signal.

## 2018-11-08 NOTE — ED Notes (Signed)
pts son also wanted a plan of care update. RN stated once all test result the EDP would be by and update them. EDP made aware of patients request.

## 2018-11-08 NOTE — ED Notes (Signed)
Patient transported to CT 

## 2018-11-08 NOTE — H&P (Addendum)
History and Physical  Connie Snyder WUJ:[id number] DOB: 1927-09-19 DOA: 11/07/2018  Referring physician: EDP PCP: Patient, No Pcp Per   Chief Complaint:  Altered mental status  HPI: Connie Countsvelyn J Hackenberg is a 85 y.o. female   history of dementia, Sjogren's disease arthritis on methotrexate, h/o anemia [facility] with hallucinations ? slurred speech (but no facial droop), ED physician denies any slurred speech and doesn't think TIA, or CVA, and CT brain negative, and CTA chest negative for PE.   pox 99% on RA,   ED requesting that patient be admitted for altered mental status as well as c/o  sob .   cxr no acute findings   one of the EKG with questionable afib? Vs poor baseline , repeat ekg NSR with chronic LBBB  Currently she reports feeling " as normal as can be" , she denies pain, denies sob.  SARSCOV2 sceening negative  Review of Systems:  Detail per HPI, Review of systems are otherwise negative  Past Medical History:  Diagnosis Date  . Arthritis   . Sjogren's disease [facility](HCC)    Past Surgical History:  Procedure Laterality Date  . TOTAL HIP ARTHROPLASTY Left 12/22/2017   Procedure: TOTAL HIP ARTHROPLASTY ANTERIOR APPROACH;  Surgeon: Tarry KosXu, Naiping M, MD;  Location: [facility] OR;  Service: Orthopedics;  Laterality: Left;   Social History:  reports that she has quit smoking. She has never used smokeless tobacco. She reports current alcohol use. No history on file for drug. Patient lives at home with son & is able to participate in activities of daily living independently   No Known Allergies  Family History  Family history unknown: Yes      Prior to Admission medications   Medication Sig Start Date End Date Taking? Authorizing Provider  aspirin 325 MG EC tablet Take 325 mg by mouth daily as needed for pain.   Yes [provider]  baclofen (LIORESAL) 10 MG tablet Take 0.5 tablets (5 mg total) by mouth 3 (three) times daily as needed for muscle spasms. Patient taking  differently: Take 10 mg by mouth 2 (two) times daily as needed for muscle spasms.  06/26/18  Yes Tarry KosXu, Naiping M, MD  celecoxib (CELEBREX) 200 MG capsule Take 200 mg by mouth daily. 06/03/18  Yes [provider]  cholecalciferol (VITAMIN D) 1000 units tablet Take 1,000 Units by mouth daily.   Yes [provider]  feeding supplement (BOOST HIGH PROTEIN) LIQD Take 1 Container by mouth daily.    Yes [provider]  ferrous sulfate 325 (65 FE) MG tablet Take 325 mg by mouth daily with breakfast.   Yes [provider]  Folic Acid-Vit B6-Vit B12 (FOLBEE) 2.5-25-1 MG TABS tablet Take 1 tablet by mouth daily.   Yes [provider]  methotrexate (RHEUMATREX) 2.5 MG tablet Take 10 mg by mouth every Sunday.  11/13/17  Yes [provider]  mirtazapine (REMERON) 30 MG tablet Take 30 mg by mouth at bedtime.  11/01/17  Yes [provider]  Multiple Vitamins-Minerals (MULTIVITAMIN WOMEN 50+) TABS Take 1 tablet by mouth daily.   Yes [provider]  ondansetron (ZOFRAN) 4 MG tablet Take 1 tablet (4 mg total) by mouth every 6 (six) hours as needed for nausea. 12/28/17  Yes Elgergawy, Leana Roeawood S, MD  senna-docusate (SENOKOT-S) 8.6-50 MG tablet Take 1 tablet by mouth at bedtime as needed for mild constipation. 12/28/17  Yes Elgergawy, Leana Roeawood S, MD  aspirin EC 81 MG tablet Take 1 tablet (81  mg total) by mouth daily. Patient not taking: Reported on 11/08/2018 12/28/17   Elgergawy, Silver Huguenin, MD  docusate sodium (COLACE) 100 MG capsule Take 1 capsule (100 mg total) by mouth 2 (two) times daily. Patient not taking: Reported on 11/08/2018 12/28/17   Elgergawy, Silver Huguenin, MD  enoxaparin (LOVENOX) 40 MG/0.4ML injection Inject 0.4 mLs (40 mg total) into the skin daily for 22 days. Patient not taking: Reported on 11/08/2018 12/28/17 01/19/18  Elgergawy, Silver Huguenin, MD  HYDROcodone-acetaminophen (NORCO) 5-325 MG tablet Take 1 tablet by mouth at bedtime as needed. Patient not  taking: Reported on 11/08/2018 06/26/18   Leandrew Koyanagi, MD  HYDROcodone-acetaminophen (NORCO/VICODIN) 5-325 MG tablet Take 1 tablet by mouth every 6 (six) hours as needed for moderate pain. Patient not taking: Reported on 11/08/2018 06/23/18   Mcarthur Rossetti, MD  methylPREDNISolone (MEDROL DOSEPAK) 4 MG TBPK tablet Use as directed Patient not taking: Reported on 11/08/2018 06/26/18   Leandrew Koyanagi, MD    Physical Exam: BP (!) 144/77   Pulse 76   Temp 98 F (36.7 C)   Resp 18   Ht 5\' 4"  (1.626 m)   Wt 54.4 kg   SpO2 99%   BMI 20.60 kg/m   . General:  NAD, Alert, pleasantly confused, not oriented to time, know she is in the hospital in [place], not able to provide the name of the hospital . Eyes: PERRL . ENT: unremarkable . Neck: supple, no JVD . Cardiovascular: RRR, +precordial murmur . Respiratory: CTABL . Abdomen: soft/NT/ND, positive bowel sounds . Skin: no rash . Musculoskeletal:  No edema . Psychiatric: calm/cooperative . Neurologic:  Alert, pleasantly confused, not oriented to time, know she is in the hospital in [place], not able to provide the name of the hospital, no focal weakness           Labs on Admission:  Basic Metabolic Panel: Recent Labs  Lab 11/07/18 2316  NA 132*  K 4.2  CL 101  CO2 20*  GLUCOSE 98  BUN 20  CREATININE 0.63  CALCIUM 9.6   Liver Function Tests: No results for input(s): AST, ALT, ALKPHOS, BILITOT, PROT, ALBUMIN in the last 168 hours. No results for input(s): LIPASE, AMYLASE in the last 168 hours. No results for input(s): AMMONIA in the last 168 hours. CBC: Recent Labs  Lab 11/07/18 2316  WBC 7.0  NEUTROABS 5.0  HGB 12.7  HCT 36.0  MCV 82.8  PLT 288   Cardiac Enzymes: No results for input(s): CKTOTAL, CKMB, CKMBINDEX, TROPONINI in the last 168 hours.  BNP (last 3 results) No results for input(s): BNP in the last 8760 hours.  ProBNP (last 3 results) No results for input(s): PROBNP in the last 8760  hours.  CBG: No results for input(s): GLUCAP in the last 168 hours.  Radiological Exams on Admission: Ct Head Wo Contrast  Result Date: 11/08/2018 CLINICAL DATA:  Hallucinations EXAM: CT HEAD WITHOUT CONTRAST TECHNIQUE: Contiguous axial images were obtained from the base of the skull through the vertex without intravenous contrast. COMPARISON:  None. FINDINGS: Brain: No evidence of acute infarction, hemorrhage, hydrocephalus, extra-axial collection or mass lesion/mass effect. Mild atrophic changes are noted. Vascular: No hyperdense vessel or unexpected calcification. Skull: Normal. Negative for fracture or focal lesion. Sinuses/Orbits: No acute finding. Other: None. IMPRESSION: No acute abnormality is noted.  Mild atrophic changes are seen. Electronically Signed   By: Inez Catalina M.D.   On: 11/08/2018 00:02   Ct Angio Chest Pe W And/or  Wo Contrast  Result Date: 11/08/2018 CLINICAL DATA:  Altered mental status.  Elevated D-dimer. EXAM: CT ANGIOGRAPHY CHEST WITH CONTRAST TECHNIQUE: Multidetector CT imaging of the chest was performed using the standard protocol during bolus administration of intravenous contrast. Multiplanar CT image reconstructions and MIPs were obtained to evaluate the vascular anatomy. CONTRAST:  100 mL Omnipaque 350 COMPARISON:  None. FINDINGS: Cardiovascular: --Pulmonary arteries: Contrast injection is sufficient to demonstrate satisfactory opacification of the pulmonary arteries to the segmental level, with attenuation of at least 200 HU at the main pulmonary artery. There is no pulmonary embolus. The main pulmonary artery is mildly enlarged, measuring 3.2 cm. --Aorta: Limited opacification of the aorta due to bolus timing optimization for the pulmonary arteries. The course and caliber of the aorta are normal. Conventional 3 vessel aortic branching pattern. There is mild aortic atherosclerosis. --Heart: Normal size. No pericardial effusion. There are coronary artery calcifications.  Mediastinum/Nodes: Multiple thyroid nodules, the largest of which measures 2.4 cm. No mediastinal or axillary lymphadenopathy. Normal course of the thoracic esophagus. Lungs/Pleura: No pulmonary nodules or masses. No pleural effusion or pneumothorax. No focal airspace consolidation. No focal pleural abnormality. Upper Abdomen: Contrast bolus timing is not optimized for evaluation of the abdominal organs. Large cyst in the right hepatic lobe. Musculoskeletal: No chest wall abnormality. No bony spinal canal stenosis. Review of the MIP images confirms the above findings. IMPRESSION: 1. No pulmonary embolus or other acute thoracic abnormality. 2. Multiple thyroid nodules, measuring up to 2.4 cm. Consider nonemergent evaluation with dedicated thyroid ultrasound. Aortic atherosclerosis (ICD10-I70.0). Electronically Signed   By: Deatra Robinson M.D.   On: 11/08/2018 03:24   Dg Chest Portable 1 View  Result Date: 11/07/2018 CLINICAL DATA:  Shortness of breath and altered mental state EXAM: PORTABLE CHEST 1 VIEW COMPARISON:  12/21/2017 FINDINGS: Cardiac shadow is within normal limits. The lungs are well aerated bilaterally. No focal infiltrate or effusion is seen. Chronic changes related to prior left humeral fracture are noted. IMPRESSION: No acute abnormality seen. Electronically Signed   By: Alcide Clever M.D.   On: 11/07/2018 23:54    Assessment/Plan Present on Admission: . Altered mental status    Altered mental status/hallucination, with baseline dementia --currently she is very pleasant and cooperative, patient reports she is feeling fine, her son reports patient is much better but not back to her baseline yet  -son reports although norco and baclofen are listed on her home meds, she  does not take them -Will get mri brain, EEG, rpr, tsh, ammonia, uds    Sob , cxr no acute findings, lung clear, she currently denies sob, no hypoxia, no cough, no edema, she does has cardiac murmur on exam but denies  chest pain, denies passing out Afib? one of the EKG with questionable afib? Vs poor baseline , repeat ekg NSR with chronic LBBB Will keep on tele  Sjogren's disease arthritis on methotrexate   COVID19 screening negative  DVT prophylaxis: lovenox  Consultants: none  Code Status: full   Family Communication:  Patient , son over the phone  Disposition Plan: med tele obs  Time spent:  Albertine Grates MD, PhD, FACP [facility] Pager [phone] If 7PM-7AM, please contact night-coverage at [email or web address], password [facility]

## 2018-11-08 NOTE — ED Notes (Signed)
Call placed to son Beverely Low to notify of bed assignment and transport pending.

## 2018-11-08 NOTE — ED Provider Notes (Addendum)
[facility] EMERGENCY DEPARTMENT Provider Note   CSN: [id number] Arrival date & time: 11/07/18  2240     History   Chief Complaint Chief Complaint  Patient presents with  . Shortness of Breath  . Hallucinations    HPI Connie Snyder is a 85 y.o. female.     The history is provided by a relative (level 5 caveat dementia). The history is limited by the condition of the patient.  Shortness of Breath Severity:  Moderate Onset quality:  Sudden Duration:  5 hours Timing:  Constant Progression:  Unchanged Chronicity:  New Context: not activity, not animal exposure and not emotional upset   Relieved by:  Nothing Worsened by:  Nothing Ineffective treatments:  None tried Associated symptoms: no abdominal pain, no fever, no rash, no sore throat and no wheezing   Risk factors: no recent alcohol use, no family hx of DVT and no hx of cancer   Patient with dementia presents with altered mental status and hallucinations stating someone was trying to kill her and shortness of breath.  Connie Snyder reports patient was in her usual state of health this afternoon and then he had to go out and came home and the patient was very anxious, hyperventilating not making sense and then stated there were people in the home trying to kill her.  He stated this stopped but the patient continued to pant and say she was short of breath and that her left arm was hurting at the shoulder.  He states he maintains all the patient's medications.  No f/c/r.  Per son, No COVID exposures. Patient is alert per her normal at this time. She is oriented to slef and knows she is at the hospital.  She continues to breath rapidly. Patient is denying chest or leg pain.  No fevers at home.    Past Medical History:  Diagnosis Date  . Arthritis   . Sjogren's disease [facility])     Patient Active Problem List   Diagnosis Date Noted  . Normocytic normochromic anemia 12/22/2017  . S/p left hip fracture 12/22/2017  . Malnutrition  of moderate degree 12/22/2017  . Closed fracture of left hip (HCC) 12/21/2017  . Closed fracture of left proximal humerus 11/30/2017    Past Surgical History:  Procedure Laterality Date  . TOTAL HIP ARTHROPLASTY Left 12/22/2017   Procedure: TOTAL HIP ARTHROPLASTY ANTERIOR APPROACH;  Surgeon: Tarry Kos, MD;  Location: [facility] OR;  Service: Orthopedics;  Laterality: Left;     OB History   No obstetric history on file.      Home Medications    Prior to Admission medications   Medication Sig Start Date End Date Taking? Authorizing Provider  aspirin EC 81 MG tablet Take 1 tablet (81 mg total) by mouth daily. 12/28/17   Elgergawy, Leana Roe, MD  baclofen (LIORESAL) 10 MG tablet Take 0.5 tablets (5 mg total) by mouth 3 (three) times daily as needed for muscle spasms. 06/26/18   Tarry Kos, MD  cholecalciferol (VITAMIN D) 1000 units tablet Take 1,000 Units by mouth daily.    [provider]  docusate sodium (COLACE) 100 MG capsule Take 1 capsule (100 mg total) by mouth 2 (two) times daily. 12/28/17   Elgergawy, Leana Roe, MD  donepezil (ARICEPT) 5 MG tablet Take 5 mg by mouth daily.  11/27/17   [provider]  enoxaparin (LOVENOX) 40 MG/0.4ML injection Inject 0.4 mLs (40 mg total) into the skin daily for 22 days. 12/28/17 01/19/18  Elgergawy, Leana Roe, MD  feeding supplement (BOOST HIGH PROTEIN) LIQD Take 1 Container by mouth 2 (two) times daily between meals.    [provider]  ferrous sulfate 325 (65 FE) MG tablet Take 325 mg by mouth daily with breakfast.    [provider]  folic acid (FOLVITE) 400 MCG tablet Take 800 mcg by mouth daily.    [provider]  HYDROcodone-acetaminophen (NORCO) 5-325 MG tablet Take 1 tablet by mouth at bedtime as needed. 06/26/18   Tarry Kos, MD  HYDROcodone-acetaminophen (NORCO/VICODIN) 5-325 MG tablet Take 1 tablet by mouth every 6 (six) hours as needed for moderate pain. 06/23/18   Kathryne Hitch, MD   memantine (NAMENDA) 5 MG tablet Take 5 mg by mouth daily. 11/20/17   [provider]  methotrexate (RHEUMATREX) 2.5 MG tablet Take 2.5 mg by mouth daily.  11/13/17   [provider]  methylPREDNISolone (MEDROL DOSEPAK) 4 MG TBPK tablet Use as directed 06/26/18   Tarry Kos, MD  mirtazapine (REMERON) 30 MG tablet Take 30 mg by mouth at bedtime.  11/01/17   [provider]  ondansetron (ZOFRAN) 4 MG tablet Take 1 tablet (4 mg total) by mouth every 6 (six) hours as needed for nausea. 12/28/17   Elgergawy, Leana Roe, MD  senna-docusate (SENOKOT-S) 8.6-50 MG tablet Take 1 tablet by mouth at bedtime as needed for mild constipation. 12/28/17   Elgergawy, Leana Roe, MD  sertraline (ZOLOFT) 25 MG tablet Take 12.5 mg by mouth daily.    [provider]  vitamin B-12 (CYANOCOBALAMIN) 1000 MCG tablet Take 1,000 mcg by mouth daily.    [provider]    Family History Family History  Family history unknown: Yes    Social History Social History   Tobacco Use  . Smoking status: Former Games developer  . Smokeless tobacco: Never Used  Substance Use Topics  . Alcohol use: Yes    Comment: weekly  . Drug use: Not on file     Allergies   Patient has no known allergies.   Review of Systems Review of Systems  Unable to perform ROS: Dementia  Constitutional: Negative for fever.  HENT: Negative for sore throat.   Eyes: Negative for visual disturbance.  Respiratory: Positive for shortness of breath. Negative for wheezing.   Gastrointestinal: Negative for abdominal pain.  Genitourinary: Negative for dysuria.  Musculoskeletal: Negative for arthralgias.  Skin: Negative for rash.  Neurological: Negative for dizziness.  Psychiatric/Behavioral: The patient is nervous/anxious.      Physical Exam Updated Vital Signs BP (!) 156/76   Pulse 64   Temp 98.2 F (36.8 C) (Oral)   Resp 17   Ht  (1.626 m)   Wt 54.4 kg   SpO2 100%   BMI 20.60 kg/m   Physical  Exam Vitals signs and nursing note reviewed.  Constitutional:      Appearance: She is normal weight.  HENT:     Head: Normocephalic and atraumatic.     Nose: Nose normal.  Eyes:     Conjunctiva/sclera: Conjunctivae normal.     Pupils: Pupils are equal, round, and reactive to light.  Neck:     Musculoskeletal: Normal range of motion and neck supple.  Cardiovascular:     Rate and Rhythm: Normal rate and regular rhythm.     Pulses: Normal pulses.     Heart sounds: Normal heart sounds.  Pulmonary:     Effort: Pulmonary effort is normal. Tachypnea present.  Breath sounds: Normal breath sounds.  Abdominal:     General: Abdomen is flat. Bowel sounds are normal.     Tenderness: There is no abdominal tenderness. There is no guarding or rebound.  Musculoskeletal: Normal range of motion.        General: No swelling.     Right lower leg: No edema.     Left lower leg: No edema.  Skin:    General: Skin is warm and dry.     Capillary Refill: Capillary refill takes less than 2 seconds.  Neurological:     General: No focal deficit present.     Mental Status: She is alert.     Cranial Nerves: No cranial nerve deficit.     Deep Tendon Reflexes: Reflexes normal.  Psychiatric:        Mood and Affect: Mood is anxious.      ED Treatments / Results  Labs (all labs ordered are listed, but only abnormal results are displayed) Results for orders placed or performed during the hospital encounter of 11/07/18  CBC with Differential/Platelet  Result Value Ref Range   WBC 7.0 4.0 - 10.5 K/uL   RBC 4.35 3.87 - 5.11 MIL/uL   Hemoglobin 12.7 12.0 - 15.0 g/dL   HCT 21.336.0 08.636.0 - 57.846.0 %   MCV 82.8 80.0 - 100.0 fL   MCH 29.2 26.0 - 34.0 pg   MCHC 35.3 30.0 - 36.0 g/dL   RDW 46.914.6 62.911.5 - 52.815.5 %   Platelets 288 150 - 400 K/uL   nRBC 0.0 0.0 - 0.2 %   Neutrophils Relative % 71 %   Neutro Abs 5.0 1.7 - 7.7 K/uL   Lymphocytes Relative 18 %   Lymphs Abs 1.3 0.7 - 4.0 K/uL   Monocytes Relative 8 %    Monocytes Absolute 0.5 0.1 - 1.0 K/uL   Eosinophils Relative 3 %   Eosinophils Absolute 0.2 0.0 - 0.5 K/uL   Basophils Relative 0 %   Basophils Absolute 0.0 0.0 - 0.1 K/uL   Immature Granulocytes 0 %   Abs Immature Granulocytes 0.02 0.00 - 0.07 K/uL  Basic metabolic panel  Result Value Ref Range   Sodium 132 (L) 135 - 145 mmol/L   Potassium 4.2 3.5 - 5.1 mmol/L   Chloride 101 98 - 111 mmol/L   CO2 20 (L) 22 - 32 mmol/L   Glucose, Bld 98 70 - 99 mg/dL   BUN 20 8 - 23 mg/dL   Creatinine, Ser 4.130.63 0.44 - 1.00 mg/dL   Calcium 9.6 8.9 - 24.410.3 mg/dL   GFR calc non Af Amer >60 >60 mL/min   GFR calc Af Amer >60 >60 mL/min   Anion gap 11 5 - 15  Urinalysis, Routine w reflex microscopic  Result Value Ref Range   Color, Urine YELLOW YELLOW   APPearance CLEAR CLEAR   Specific Gravity, Urine 1.010 1.005 - 1.030   pH 7.0 5.0 - 8.0   Glucose, UA NEGATIVE NEGATIVE mg/dL   Hgb urine dipstick NEGATIVE NEGATIVE   Bilirubin Urine NEGATIVE NEGATIVE   Ketones, ur NEGATIVE NEGATIVE mg/dL   Protein, ur NEGATIVE NEGATIVE mg/dL   Nitrite NEGATIVE NEGATIVE   Leukocytes,Ua NEGATIVE NEGATIVE  D-dimer, quantitative ([facility])  Result Value Ref Range   D-Dimer, Quant 14.70 (H) 0.00 - 0.50 ug/mL-FEU  Troponin I (High Sensitivity)  Result Value Ref Range   Troponin I (High Sensitivity) 5 <18 ng/L  Troponin I (High Sensitivity)  Result Value Ref Range  Troponin I (High Sensitivity) 6 <18 ng/L   Ct Head Wo Contrast  Result Date: 11/08/2018 CLINICAL DATA:  Hallucinations EXAM: CT HEAD WITHOUT CONTRAST TECHNIQUE: Contiguous axial images were obtained from the base of the skull through the vertex without intravenous contrast. COMPARISON:  None. FINDINGS: Brain: No evidence of acute infarction, hemorrhage, hydrocephalus, extra-axial collection or mass lesion/mass effect. Mild atrophic changes are noted. Vascular: No hyperdense vessel or unexpected calcification. Skull: Normal. Negative for fracture or focal  lesion. Sinuses/Orbits: No acute finding. Other: None. IMPRESSION: No acute abnormality is noted.  Mild atrophic changes are seen. Electronically Signed   By: Inez Catalina M.D.   On: 11/08/2018 00:02   Dg Chest Portable 1 View  Result Date: 11/07/2018 CLINICAL DATA:  Shortness of breath and altered mental state EXAM: PORTABLE CHEST 1 VIEW COMPARISON:  12/21/2017 FINDINGS: Cardiac shadow is within normal limits. The lungs are well aerated bilaterally. No focal infiltrate or effusion is seen. Chronic changes related to prior left humeral fracture are noted. IMPRESSION: No acute abnormality seen. Electronically Signed   By: Inez Catalina M.D.   On: 11/07/2018 23:54    EKG EKG Interpretation  Date/Time:  Thursday November 08 2018 00:16:53 EDT Ventricular Rate:  66 PR Interval:    QRS Duration: 114 QT Interval:  488 QTC Calculation: 511 R Axis:   -60 Text Interpretation:  Atrial fibrillation with a competing junctional pacemaker Left axis deviation Incomplete left bundle branch block Prolonged QT Confirmed by Randal Buba, Iyannah Blake ([id number]) on 11/08/2018 12:20:17 AM   Radiology Ct Head Wo Contrast  Result Date: 11/08/2018 CLINICAL DATA:  Hallucinations EXAM: CT HEAD WITHOUT CONTRAST TECHNIQUE: Contiguous axial images were obtained from the base of the skull through the vertex without intravenous contrast. COMPARISON:  None. FINDINGS: Brain: No evidence of acute infarction, hemorrhage, hydrocephalus, extra-axial collection or mass lesion/mass effect. Mild atrophic changes are noted. Vascular: No hyperdense vessel or unexpected calcification. Skull: Normal. Negative for fracture or focal lesion. Sinuses/Orbits: No acute finding. Other: None. IMPRESSION: No acute abnormality is noted.  Mild atrophic changes are seen. Electronically Signed   By: Inez Catalina M.D.   On: 11/08/2018 00:02   Dg Chest Portable 1 View  Result Date: 11/07/2018 CLINICAL DATA:  Shortness of breath and altered mental state EXAM: PORTABLE  CHEST 1 VIEW COMPARISON:  12/21/2017 FINDINGS: Cardiac shadow is within normal limits. The lungs are well aerated bilaterally. No focal infiltrate or effusion is seen. Chronic changes related to prior left humeral fracture are noted. IMPRESSION: No acute abnormality seen. Electronically Signed   By: Inez Catalina M.D.   On: 11/07/2018 23:54    Procedures Procedures (including critical care time)  Medications Ordered in ED Medications  acetaminophen (TYLENOL) tablet 1,000 mg (1,000 mg Oral Given 11/08/18 0057)  iohexol (OMNIPAQUE) 350 MG/ML injection 100 mL (100 mLs Intravenous Contrast Given 11/08/18 0237)    I do not believe this is a TIA.  It sounds like the patient was beside herself when her son arrived home and was hyperventilating.  I suspect she was near syncopal.  When he calmed her down as he said she appeared very anxious she stated that there were people trying to kill her and that she felt short of breath and based on this information and this not being the patient's baseline he brought her in for evaluation.    Final Clinical Impressions(s) / ED Diagnoses   Patient presents with dyspnea with elevated ddimer, negative for PE.  She also has delirium and  I am unsure of the source.  Will admit for delirium and AMS work up.  First troponin is negative.     Samule Life, MD 11/08/18 [id number]    Cy BlamerPalumbo, Shlok Raz, MD 11/08/18 [id number]

## 2018-11-09 ENCOUNTER — Observation Stay (HOSPITAL_COMMUNITY)
Admit: 2018-11-09 | Discharge: 2018-11-09 | Disposition: A | Discharge status: Home / Self Care | Payer: Medicare Other | Attending: Internal Medicine | Admitting: Internal Medicine

## 2018-11-09 DIAGNOSIS — E876 Hypokalemia: Secondary | ICD-10-CM

## 2018-11-09 DIAGNOSIS — E041 Nontoxic single thyroid nodule: Secondary | ICD-10-CM

## 2018-11-09 DIAGNOSIS — R0600 Dyspnea, unspecified: Secondary | ICD-10-CM

## 2018-11-09 DIAGNOSIS — F0150 Vascular dementia without behavioral disturbance: Secondary | ICD-10-CM | POA: Diagnosis not present

## 2018-11-09 DIAGNOSIS — R410 Disorientation, unspecified: Secondary | ICD-10-CM | POA: Diagnosis not present

## 2018-11-09 LAB — BASIC METABOLIC PANEL
Anion gap: 10 (ref 5–15)
BUN: 8 mg/dL (ref 8–23)
CO2: 23 mmol/L (ref 22–32)
Calcium: 9.1 mg/dL (ref 8.9–10.3)
Chloride: 105 mmol/L (ref 98–111)
Creatinine, Ser: 0.59 mg/dL (ref 0.44–1.00)
GFR calc Af Amer: >60 mL/min (ref >60)
GFR calc non Af Amer: >60 mL/min (ref >60)
Glucose, Bld: 92 mg/dL (ref 70–99)
Potassium: 3 mmol/L — ABNORMAL LOW (ref 3.5–5.1)
Sodium: 138 mmol/L (ref 135–145)

## 2018-11-09 LAB — CBC WITH DIFFERENTIAL/PLATELET
Abs Immature Granulocytes: 0.02 10*3/uL (ref 0.00–0.07)
Basophils Absolute: 0 10*3/uL (ref 0.0–0.1)
Basophils Relative: 1 %
Eosinophils Absolute: 0.4 10*3/uL (ref 0.0–0.5)
Eosinophils Relative: 7 %
HCT: 34.5 % — ABNORMAL LOW (ref 36.0–46.0)
Hemoglobin: 12.3 g/dL (ref 12.0–15.0)
Immature Granulocytes: 0 %
Lymphocytes Relative: 20 %
Lymphs Abs: 1.3 10*3/uL (ref 0.7–4.0)
MCH: 29.5 pg (ref 26.0–34.0)
MCHC: 35.7 g/dL (ref 30.0–36.0)
MCV: 82.7 fL (ref 80.0–100.0)
Monocytes Absolute: 0.4 10*3/uL (ref 0.1–1.0)
Monocytes Relative: 6 %
Neutro Abs: 4.1 10*3/uL (ref 1.7–7.7)
Neutrophils Relative %: 66 %
Platelets: 294 10*3/uL (ref 150–400)
RBC: 4.17 MIL/uL (ref 3.87–5.11)
RDW: 15.1 % (ref 11.5–15.5)
WBC: 6.2 10*3/uL (ref 4.0–10.5)
nRBC: 0.3 % — ABNORMAL HIGH (ref 0.0–0.2)

## 2018-11-09 LAB — MAGNESIUM: Magnesium: 1.9 mg/dL (ref 1.7–2.4)

## 2018-11-09 LAB — RPR: RPR Ser Ql: NONREACTIVE

## 2018-11-09 MED ORDER — ENSURE ENLIVE PO LIQD
237.0000 mL | Freq: Every day | ORAL | Status: DC
  Administered 2018-11-09: 237 mL via ORAL

## 2018-11-09 MED ORDER — POTASSIUM CHLORIDE CRYS ER 20 MEQ PO TBCR: 40.0000 meq | EXTENDED_RELEASE_TABLET | Freq: Every day | ORAL | 0 refills | Status: DC

## 2018-11-09 MED ORDER — SODIUM CHLORIDE 0.9 % IV SOLN
INTRAVENOUS | Status: DC | PRN
  Administered 2018-11-09: 250 mL via INTRAVENOUS

## 2018-11-09 MED ORDER — POTASSIUM CHLORIDE CRYS ER 20 MEQ PO TBCR
40.0000 meq | EXTENDED_RELEASE_TABLET | Freq: Two times a day (BID) | ORAL | Status: DC
  Administered 2018-11-09: 40 meq via ORAL
  Filled 2018-11-09: qty 2

## 2018-11-09 MED ORDER — POTASSIUM CHLORIDE 10 MEQ/100ML IV SOLN
10.0000 meq | INTRAVENOUS | Status: AC
  Administered 2018-11-09 (×4): 10 meq via INTRAVENOUS
  Filled 2018-11-09 (×4): qty 100

## 2018-11-09 NOTE — Discharge Summary (Signed)
. Physician Discharge Summary  Connie Snyder DXI:[id number] DOB: July 24, 1927 DOA: 11/07/2018  PCP: Georgianne Fick, MD  Admit date: 11/07/2018 Discharge date: 11/09/2018  Admitted From: Home Disposition:  Discharged to home.  Recommendations for Outpatient Follow-up:  1. Follow up with PCP in 1 week   Discharge Condition: Stable  CODE STATUS: FULL    Brief/Interim Summary: history of dementia, Sjogren's disease arthritis on methotrexate, h/o anemia present to [facility] with hallucinations ? slurred speech (but no facial droop), ED physician denies any slurred speech and doesn't think TIA, or CVA, and CT brain negative, and CTA chest negative for PE.   pox 99% on RA,   ED requesting that patient be admitted for altered mental status as well as c/o  sob   Discharge Diagnoses:  Active Problems:   Altered mental status   Confusion   Vascular dementia without behavioral disturbance (HCC)  Altered mental status/hallucination, with baseline dementia     - currently she is very pleasant and cooperative, patient reports she is feeling fine, her son reports patient is much better but not back to her baseline yet     - son reports although norco and baclofen are listed on her home meds, she  does not take them     - Will get mri brain, EEG, rpr, tsh, ammonia, uds     - MR Brain negative, EEG negative, RPR negative, TSH ok, Ammonia ok, UDS negative     - per son, pt much better than Wednesday     - seen walking halls w/ nursing and stable, ok for d/c     - likely advancing dementia  Dyspnea     - cxr no acute findings, lung clear, she currently denies sob, no hypoxia, no cough, no edema, she does has cardiac murmur on exam but denies chest pain, denies passing out     - CTA chest negative for PE   Afib?     - one of the EKG with questionable afib? Vs poor baseline , repeat ekg NSR with chronic LBBB     - Will keep on tele     - normal sinus on tele  Sjogren's disease arthritis      -  on methotrexate  Thyroid nodules as seen on CTA chest     - recommend nonemergent eval w/ PCP in 2 - 4 weeks  Discharge Instructions   Allergies as of 11/09/2018   No Known Allergies     Medication List    STOP taking these medications   docusate sodium 100 MG capsule Commonly known as: COLACE   enoxaparin 40 MG/0.4ML injection Commonly known as: LOVENOX   HYDROcodone-acetaminophen 5-325 MG tablet Commonly known as: Norco   methylPREDNISolone 4 MG Tbpk tablet Commonly known as: MEDROL DOSEPAK     TAKE these medications   aspirin 325 MG EC tablet Take 325 mg by mouth daily as needed for pain. What changed: Another medication with the same name was removed. Continue taking this medication, and follow the directions you see here.   baclofen 10 MG tablet Commonly known as: LIORESAL Take 0.5 tablets (5 mg total) by mouth 3 (three) times daily as needed for muscle spasms. What changed:   how much to take  when to take this   celecoxib 200 MG capsule Commonly known as: CELEBREX Take 200 mg by mouth daily.   cholecalciferol 1000 units tablet Commonly known as: VITAMIN D Take 1,000 Units by mouth daily.   feeding supplement  Liqd Take 1 Container by mouth daily.   ferrous sulfate 325 (65 FE) MG tablet Take 325 mg by mouth daily with breakfast.   Folic Acid-Vit B6-Vit B12 2.5-25-1 MG Tabs tablet Commonly known as: FOLBEE Take 1 tablet by mouth daily.   methotrexate 2.5 MG tablet Commonly known as: RHEUMATREX Take 10 mg by mouth every Sunday.   mirtazapine 30 MG tablet Commonly known as: REMERON Take 30 mg by mouth at bedtime.   Multivitamin Women 50+ Tabs Take 1 tablet by mouth daily.   ondansetron 4 MG tablet Commonly known as: ZOFRAN Take 1 tablet (4 mg total) by mouth every 6 (six) hours as needed for nausea.   potassium chloride SA 20 MEQ tablet Commonly known as: K-DUR Take 2 tablets (40 mEq total) by mouth daily for 3 days.   senna-docusate  8.6-50 MG tablet Commonly known as: Senokot-S Take 1 tablet by mouth at bedtime as needed for mild constipation.      Follow-up Information    Georgianne Fick, MD Follow up in 1 week(s).   Specialty: Internal Medicine Why: hospital discharge follow up, please discuss with pcp about neurology referral  Contact information: [address] [place] [address] [phone]          No Known Allergies   Procedures/Studies: Ct Head Wo Contrast  Result Date: 11/09/2018 CLINICAL DATA:  History of loose in a shins, possible encephalopathy EXAM: CT HEAD WITHOUT CONTRAST TECHNIQUE: Contiguous axial images were obtained from the base of the skull through the vertex without intravenous contrast. COMPARISON:  11/07/2018 CT, MRI from 11/08/2018 FINDINGS: Brain: Motion artifact is noted. Chronic atrophic changes are again seen. No findings to suggest acute hemorrhage, acute infarction or space-occupying mass lesion are noted. Vascular: No hyperdense vessel or unexpected calcification. Skull: Normal. Negative for fracture or focal lesion. Sinuses/Orbits: No acute finding. Other: None. IMPRESSION: Stable chronic atrophic changes. No acute abnormality noted. Examination is somewhat limited by patient motion artifact. Electronically Signed   By: Alcide Clever M.D.   On: 11/09/2018 00:14   Ct Head Wo Contrast  Result Date: 11/08/2018 CLINICAL DATA:  Hallucinations EXAM: CT HEAD WITHOUT CONTRAST TECHNIQUE: Contiguous axial images were obtained from the base of the skull through the vertex without intravenous contrast. COMPARISON:  None. FINDINGS: Brain: No evidence of acute infarction, hemorrhage, hydrocephalus, extra-axial collection or mass lesion/mass effect. Mild atrophic changes are noted. Vascular: No hyperdense vessel or unexpected calcification. Skull: Normal. Negative for fracture or focal lesion. Sinuses/Orbits: No acute finding. Other: None. IMPRESSION: No acute abnormality is  noted.  Mild atrophic changes are seen. Electronically Signed   By: Alcide Clever M.D.   On: 11/08/2018 00:02   Ct Angio Chest Pe W And/or Wo Contrast  Result Date: 11/08/2018 CLINICAL DATA:  Altered mental status.  Elevated D-dimer. EXAM: CT ANGIOGRAPHY CHEST WITH CONTRAST TECHNIQUE: Multidetector CT imaging of the chest was performed using the standard protocol during bolus administration of intravenous contrast. Multiplanar CT image reconstructions and MIPs were obtained to evaluate the vascular anatomy. CONTRAST:  100 mL Omnipaque 350 COMPARISON:  None. FINDINGS: Cardiovascular: --Pulmonary arteries: Contrast injection is sufficient to demonstrate satisfactory opacification of the pulmonary arteries to the segmental level, with attenuation of at least 200 HU at the main pulmonary artery. There is no pulmonary embolus. The main pulmonary artery is mildly enlarged, measuring 3.2 cm. --Aorta: Limited opacification of the aorta due to bolus timing optimization for the pulmonary arteries. The course and caliber of the aorta are  normal. Conventional 3 vessel aortic branching pattern. There is mild aortic atherosclerosis. --Heart: Normal size. No pericardial effusion. There are coronary artery calcifications. Mediastinum/Nodes: Multiple thyroid nodules, the largest of which measures 2.4 cm. No mediastinal or axillary lymphadenopathy. Normal course of the thoracic esophagus. Lungs/Pleura: No pulmonary nodules or masses. No pleural effusion or pneumothorax. No focal airspace consolidation. No focal pleural abnormality. Upper Abdomen: Contrast bolus timing is not optimized for evaluation of the abdominal organs. Large cyst in the right hepatic lobe. Musculoskeletal: No chest wall abnormality. No bony spinal canal stenosis. Review of the MIP images confirms the above findings. IMPRESSION: 1. No pulmonary embolus or other acute thoracic abnormality. 2. Multiple thyroid nodules, measuring up to 2.4 cm. Consider nonemergent  evaluation with dedicated thyroid ultrasound. Aortic atherosclerosis (ICD10-I70.0). Electronically Signed   By: Deatra RobinsonKevin  Herman M.D.   On: 11/08/2018 03:24   Mr Brain Wo Contrast  Result Date: 11/08/2018 CLINICAL DATA:  Encephalopathy.  History of dementia. EXAM: MRI HEAD WITHOUT CONTRAST TECHNIQUE: Multiplanar, multiecho pulse sequences of the brain and surrounding structures were obtained without intravenous contrast. COMPARISON:  Head CT 11/07/2018 FINDINGS: The examination had to be discontinued prior to completion due to the patient attempting to climb out of the scanner. Axial diffusion, axial FLAIR, axial T2, and sagittal T1 sequences were obtained. There is mild motion on the diffusion sequence, however there is moderate to severe motion on the other sequences. Brain: No acute infarct, gross intracranial hemorrhage, significant intracranial mass effect, or significant extra-axial fluid collection is identified. Cerebral atrophy is mild for age. Periventricular white matter T2 hyperintensities are nonspecific but compatible with mild chronic small vessel ischemic disease, not greater than expected for age. Vascular: Major intracranial vascular flow voids are grossly preserved within limitations of motion. Skull and upper cervical spine: No suspicious marrow lesion. Sinuses/Orbits: Grossly unremarkable. Other: None. IMPRESSION: Motion degraded, incomplete examination. No acute intracranial abnormality identified. Electronically Signed   By: Sebastian AcheAllen  Grady M.D.   On: 11/08/2018 18:47   Dg Chest Portable 1 View  Result Date: 11/07/2018 CLINICAL DATA:  Shortness of breath and altered mental state EXAM: PORTABLE CHEST 1 VIEW COMPARISON:  12/21/2017 FINDINGS: Cardiac shadow is within normal limits. The lungs are well aerated bilaterally. No focal infiltrate or effusion is seen. Chronic changes related to prior left humeral fracture are noted. IMPRESSION: No acute abnormality seen. Electronically Signed   By: Alcide CleverMark   Lukens M.D.   On: 11/07/2018 23:54    (Echo, Carotid, EGD, Colonoscopy, ERCP)    Subjective: "I'm in the hospital?"  Discharge Exam: Vitals:   11/08/18 2109 11/09/18 0507  BP: 123/82 (!) 146/85  Pulse: 85 64  Resp: 14 14  Temp: 98.5 F (36.9 C) 97.9 F (36.6 C)  SpO2: 100% 100%   Vitals:   11/08/18 1454 11/08/18 1457 11/08/18 2109 11/09/18 0507  BP:  (!) 143/79 123/82 (!) 146/85  Pulse:  67 85 64  Resp:  17 14 14   Temp: 97.9 F (36.6 C)  98.5 F (36.9 C) 97.9 F (36.6 C)  TempSrc: Oral  Oral Oral  SpO2:  100% 100% 100%  Weight:      Height:        General: 85 y.o. female resting in bed in NAD Eyes: PERRL, normal sclera ENMT: Nares patent w/o discharge, orophaynx clear, dentition normal, ears w/o discharge/lesions/ulcers Cardiovascular: RRR, +S1, S2, no m/g/r, equal pulses throughout Respiratory: CTABL, no w/r/r, normal WOB GI: BS+, NDNT, no masses noted, no organomegaly noted MSK: No e/c/c  Skin: No rashes, bruises, ulcerations noted Neuro: Alert to name, follows commands Psyc: Appropriate interaction and affect, calm/cooperative     The results of significant diagnostics from this hospitalization (including imaging, microbiology, ancillary and laboratory) are listed below for reference.     Microbiology: Recent Results (from the past 240 hour(s))  SARS Coronavirus 2 [facility](Hospital order, Performed [facility]) Nasopharyngeal Nasopharyngeal Swab     Status: None   Collection Time: 11/08/18  1:53 AM   Specimen: Nasopharyngeal Swab  Result Value Ref Range Status   SARS Coronavirus 2 NEGATIVE NEGATIVE Final    Comment: (NOTE) If result is NEGATIVE SARS-CoV-2 target nucleic acids are NOT DETECTED. The SARS-CoV-2 RNA is generally detectable in upper and lower  respiratory specimens during the acute phase of infection. The lowest  concentration of SARS-CoV-2 viral copies this assay can detect is 250  copies / mL. A negative result does not  preclude SARS-CoV-2 infection  and should not be used as the sole basis for treatment or other  patient management decisions.  A negative result may occur with  improper specimen collection / handling, submission of specimen other  than nasopharyngeal swab, presence of viral mutation(s) within the  areas targeted by this assay, and inadequate number of viral copies  (<250 copies / mL). A negative result must be combined with clinical  observations, patient history, and epidemiological information. If result is POSITIVE SARS-CoV-2 target nucleic acids are DETECTED. The SARS-CoV-2 RNA is generally detectable in upper and lower  respiratory specimens dur ing the acute phase of infection.  Positive  results are indicative of active infection with SARS-CoV-2.  Clinical  correlation with patient history and other diagnostic information is  necessary to determine patient infection status.  Positive results do  not rule out bacterial infection or co-infection with other viruses. If result is PRESUMPTIVE POSTIVE SARS-CoV-2 nucleic acids MAY BE PRESENT.   A presumptive positive result was obtained on the submitted specimen  and confirmed on repeat testing.  While 2019 novel coronavirus  (SARS-CoV-2) nucleic acids may be present in the submitted sample  additional confirmatory testing may be necessary for epidemiological  and / or clinical management purposes  to differentiate between  SARS-CoV-2 and other Sarbecovirus currently known to infect humans.  If clinically indicated additional testing with an alternate test  methodology [phone]([id number]) is advised. The SARS-CoV-2 RNA is generally  detectable in upper and lower respiratory sp ecimens during the acute  phase of infection. The expected result is Negative. Fact Sheet for Patients:  BoilerBrush.com.cyhttps://www.fda.gov/media/136312/download Fact Sheet for Healthcare Providers: https://pope.com/https://www.fda.gov/media/136313/download This test is not yet approved or cleared by  the [place] FDA and has been authorized for detection and/or diagnosis of SARS-CoV-2 by FDA under an Emergency Use Authorization (EUA).  This EUA will remain in effect (meaning this test can be used) for the duration of the COVID-19 declaration under Section 564(b)(1) of the Act, 21 U.S.C. section 360bbb-3(b)(1), unless the authorization is terminated or revoked sooner. Performed at [facility] [place], [address] [place], [place] [address]      Labs: BNP (last 3 results) No results for input(s): BNP in the last 8760 hours. Basic Metabolic Panel: Recent Labs  Lab 11/07/18 2316 11/08/18 1511 11/08/18 1512 11/09/18 0424  NA 132* 140  --  138  K 4.2 3.9  --  3.0*  CL 101 104  --  105  CO2 20* 23  --  23  GLUCOSE 98 100*  --  92  BUN 20 11  --  8  CREATININE 0.63 0.53  --  0.59  CALCIUM 9.6 9.3  --  9.1  MG  --   --  1.8 1.9   Liver Function Tests: Recent Labs  Lab 11/08/18 1511  AST 35  ALT 31  ALKPHOS 81  BILITOT 0.4  PROT 7.2  ALBUMIN 3.6   No results for input(s): LIPASE, AMYLASE in the last 168 hours. Recent Labs  Lab 11/08/18 1513  AMMONIA <9*   CBC: Recent Labs  Lab 11/07/18 2316 11/09/18 0424  WBC 7.0 6.2  NEUTROABS 5.0 4.1  HGB 12.7 12.3  HCT 36.0 34.5*  MCV 82.8 82.7  PLT 288 294   Cardiac Enzymes: No results for input(s): CKTOTAL, CKMB, CKMBINDEX, TROPONINI in the last 168 hours. BNP: Invalid input(s): POCBNP CBG: No results for input(s): GLUCAP in the last 168 hours. D-Dimer Recent Labs    11/07/18 2330  DDIMER 14.70*   Hgb A1c No results for input(s): HGBA1C in the last 72 hours. Lipid Profile No results for input(s): CHOL, HDL, LDLCALC, TRIG, CHOLHDL, LDLDIRECT in the last 72 hours. Thyroid function studies Recent Labs    11/08/18 1506  TSH 1.582   Anemia work up No results for input(s): VITAMINB12, FOLATE, FERRITIN, TIBC, IRON, RETICCTPCT in the last 72 hours. Urinalysis    Component Value Date/Time    COLORURINE YELLOW 11/07/2018 2316   APPEARANCEUR CLEAR 11/07/2018 2316   LABSPEC 1.010 11/07/2018 2316   PHURINE 7.0 11/07/2018 2316   GLUCOSEU NEGATIVE 11/07/2018 2316   HGBUR NEGATIVE 11/07/2018 2316   BILIRUBINUR NEGATIVE 11/07/2018 2316   KETONESUR NEGATIVE 11/07/2018 2316   PROTEINUR NEGATIVE 11/07/2018 2316   NITRITE NEGATIVE 11/07/2018 2316   LEUKOCYTESUR NEGATIVE 11/07/2018 2316   Sepsis Labs Invalid input(s): PROCALCITONIN,  WBC,  LACTICIDVEN Microbiology Recent Results (from the past 240 hour(s))  SARS Coronavirus 2 [facility] order, Performed in [facility] hospital lab) Nasopharyngeal Nasopharyngeal Swab     Status: None   Collection Time: 11/08/18  1:53 AM   Specimen: Nasopharyngeal Swab  Result Value Ref Range Status   SARS Coronavirus 2 NEGATIVE NEGATIVE Final    Comment: (NOTE) If result is NEGATIVE SARS-CoV-2 target nucleic acids are NOT DETECTED. The SARS-CoV-2 RNA is generally detectable in upper and lower  respiratory specimens during the acute phase of infection. The lowest  concentration of SARS-CoV-2 viral copies this assay can detect is 250  copies / mL. A negative result does not preclude SARS-CoV-2 infection  and should not be used as the sole basis for treatment or other  patient management decisions.  A negative result may occur with  improper specimen collection / handling, submission of specimen other  than nasopharyngeal swab, presence of viral mutation(s) within the  areas targeted by this assay, and inadequate number of viral copies  (<250 copies / mL). A negative result must be combined with clinical  observations, patient history, and epidemiological information. If result is POSITIVE SARS-CoV-2 target nucleic acids are DETECTED. The SARS-CoV-2 RNA is generally detectable in upper and lower  respiratory specimens dur ing the acute phase of infection.  Positive  results are indicative of active infection with SARS-CoV-2.  Clinical  correlation  with patient history and other diagnostic information is  necessary to determine patient infection status.  Positive results do  not rule out bacterial infection or co-infection with other viruses. If result is PRESUMPTIVE POSTIVE SARS-CoV-2 nucleic acids MAY BE PRESENT.   A presumptive positive result  was obtained on the submitted specimen  and confirmed on repeat testing.  While 2019 novel coronavirus  (SARS-CoV-2) nucleic acids may be present in the submitted sample  additional confirmatory testing may be necessary for epidemiological  and / or clinical management purposes  to differentiate between  SARS-CoV-2 and other Sarbecovirus currently known to infect humans.  If clinically indicated additional testing with an alternate test  methodology [phone]) is advised. The SARS-CoV-2 RNA is generally  detectable in upper and lower respiratory sp ecimens during the acute  phase of infection. The expected result is Negative. Fact Sheet for Patients:  StrictlyIdeas.no Fact Sheet for Healthcare Providers: BankingDealers.co.za This test is not yet approved or cleared by the [place] FDA and has been authorized for detection and/or diagnosis of SARS-CoV-2 by FDA under an Emergency Use Authorization (EUA).  This EUA will remain in effect (meaning this test can be used) for the duration of the COVID-19 declaration under Section 564(b)(1) of the Act, 21 U.S.C. section 360bbb-3(b)(1), unless the authorization is terminated or revoked sooner. Performed at [facility], [address] [place], [place] [address]      Time coordinating discharge: 45 minutes  SIGNED:   Jonnie Finner, DO  [facility] 11/09/2018, 2:37 PM Pager   If 7PM-7AM, please contact night-coverage [email or web address] Password [email or web address]

## 2018-11-09 NOTE — Procedures (Signed)
Patient Name: Connie Snyder  MRN: [id number]  Epilepsy Attending: Lora Havens  Referring Physician/Provider: Dr. Florencia Reasons Date: 11/13/2018 Duration: 27.25 minutes  Patient history: 85 year old female with altered mental status.  EEG ordered to evaluate for seizures.  Level of alertness: Awake  Technical aspects: This EEG study was done with scalp electrodes positioned according to the 10-20 International system of electrode placement. Electrical activity was acquired at a sampling rate of 500Hz  and reviewed with a high frequency filter of 70Hz  and a low frequency filter of 1Hz . EEG data were recorded continuously and digitally stored.   Description: he posterior dominant rhythm consists of 8-9 Hz activity of moderate voltage (25-35 uV) seen predominantly in posterior head regions, symmetric and reactive to eye opening and eye closing. Drowsiness was characterized by attenuation of the posterior background rhythm. Hyperventilation and photic stimulation were not performed.  IMPRESSION: This study is within normal limits. However, only wakefulness and drowsiness were recorded. If suspicion for interictal activity remains a concern, a prolonged study including sleep should be considered.  No seizures or epileptiform discharges were seen throughout the recording.  Eidan Muellner Barbra Sarks

## 2018-11-09 NOTE — Progress Notes (Signed)
Offsite EEG completed at [facility], results pending. 

## 2019-02-15 ENCOUNTER — Other Ambulatory Visit: Payer: Self-pay

## 2019-02-15 DIAGNOSIS — Z20822 Contact with and (suspected) exposure to covid-19: Secondary | ICD-10-CM

## 2019-02-16 LAB — NOVEL CORONAVIRUS, NAA: SARS-CoV-2, NAA: NOT DETECTED

## 2019-03-26 IMAGING — DX DG HIP (WITH OR WITHOUT PELVIS) 2-3V*L*
3 series · 3 of 3 positions shown · non-contrast
Comparison: None.

CLINICAL DATA: Fall.  Left hip pain.

EXAM:
DG HIP (WITH OR WITHOUT PELVIS) 2-3V LEFT

[pelvis ap]
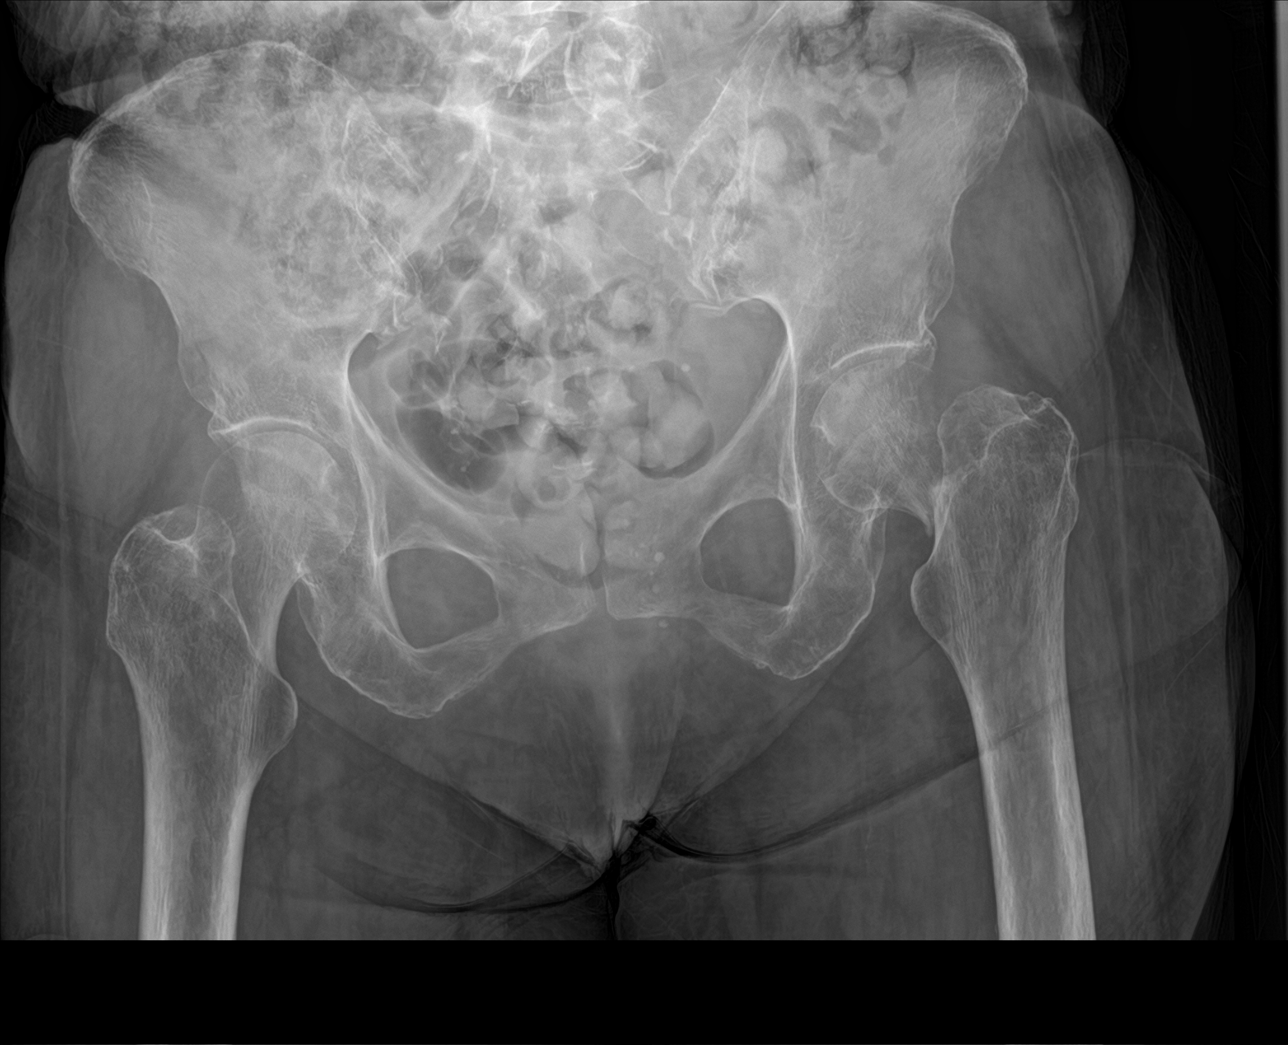

[hip ap]
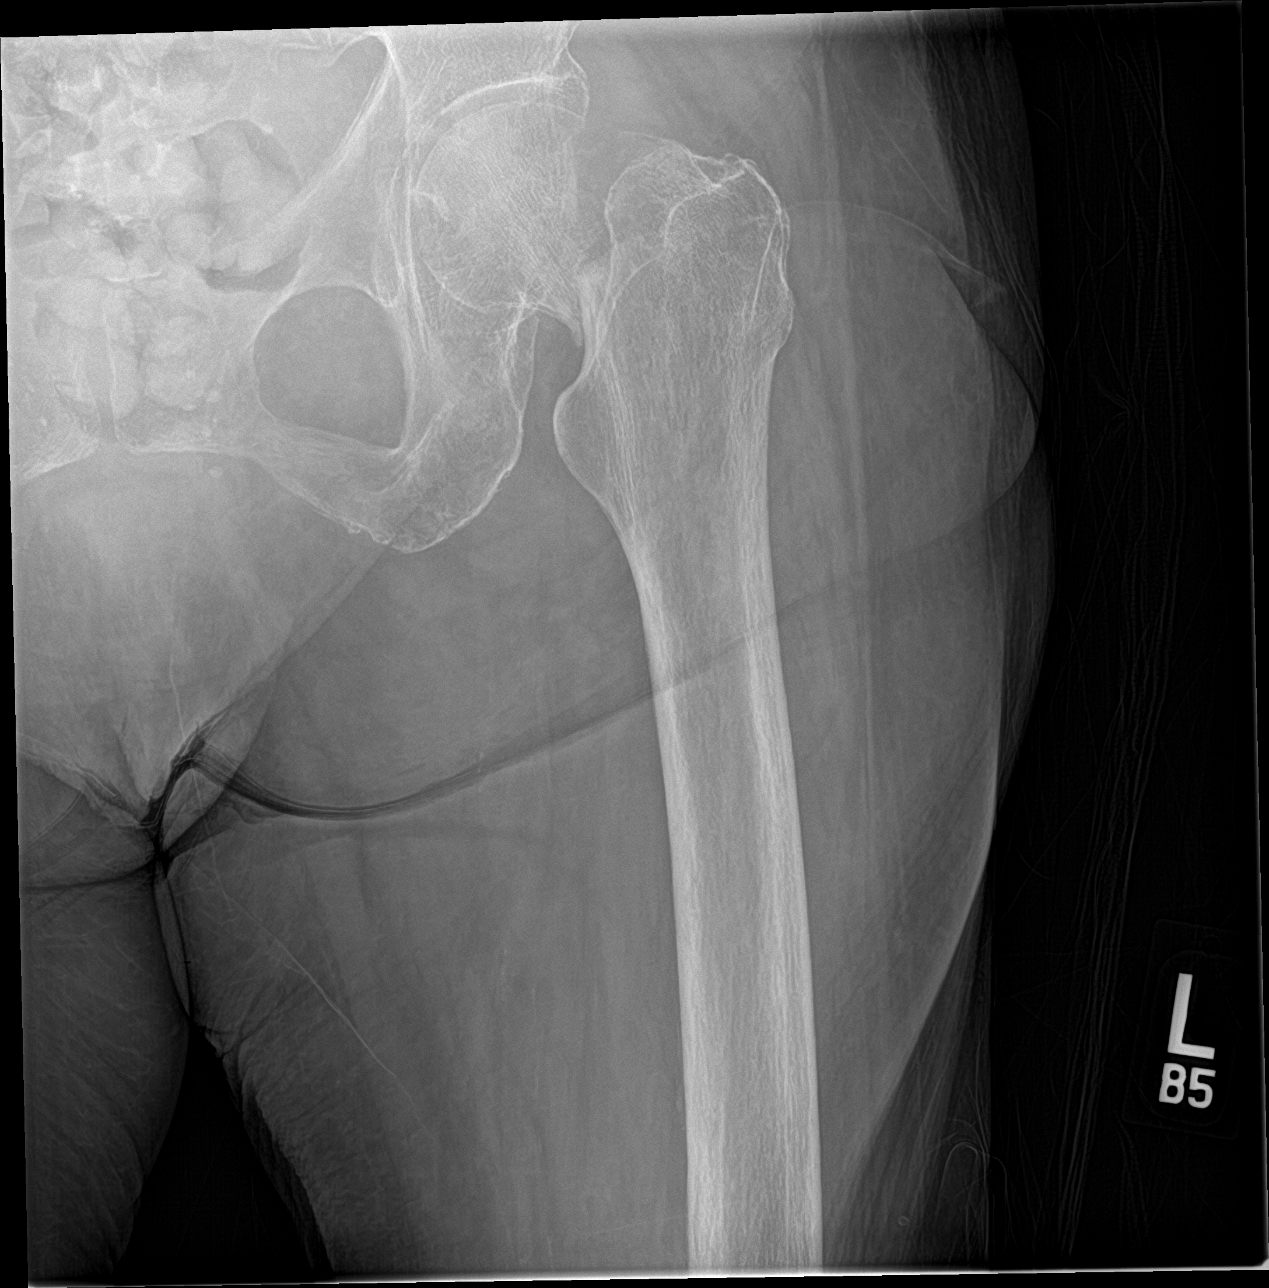

[hip lat]
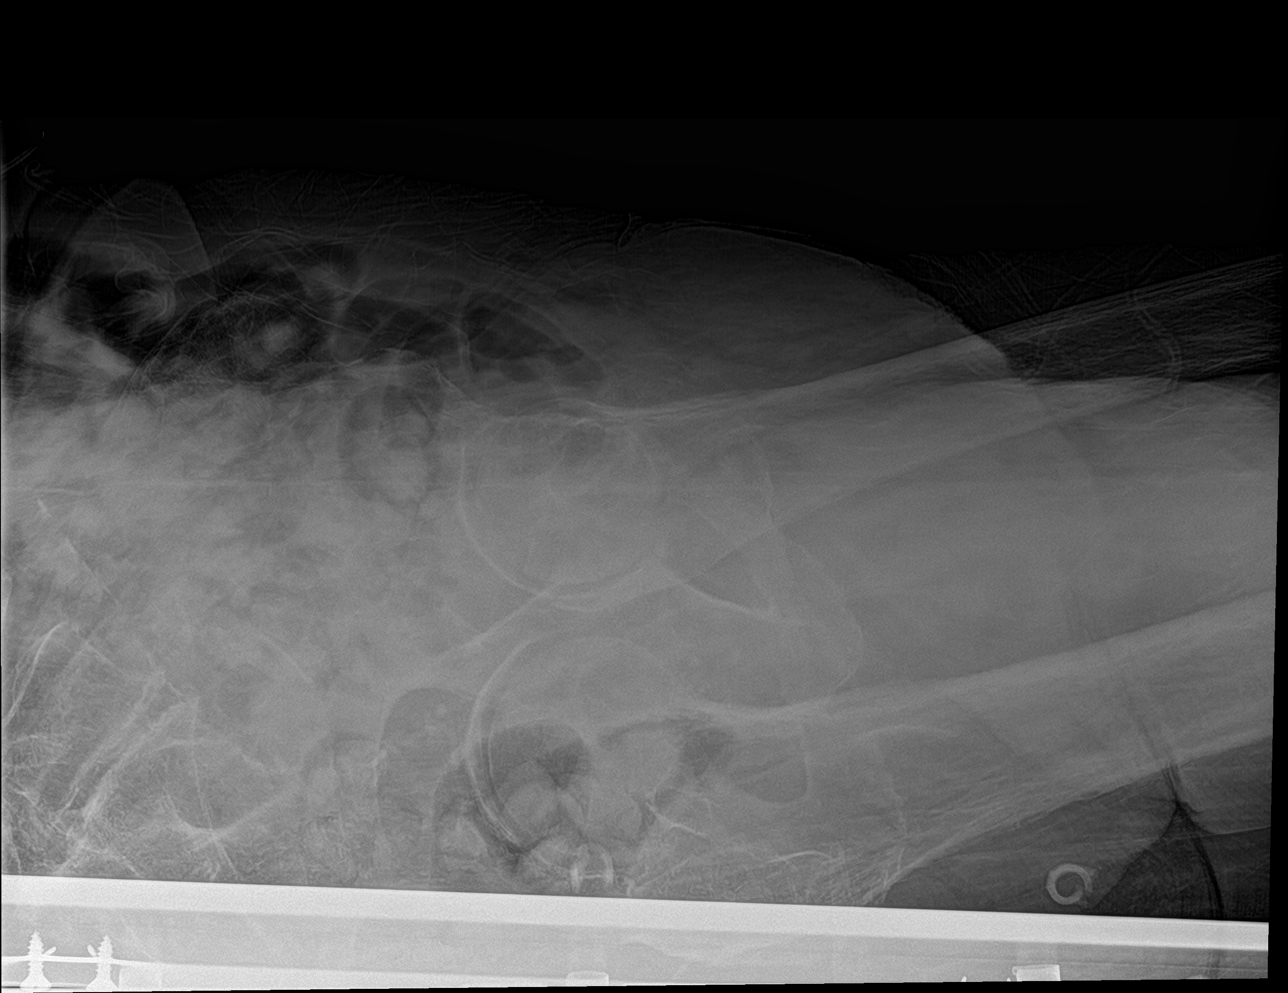

[3 of 3 positions shown; findings below may reference images not displayed]

FINDINGS: Bones are diffusely demineralized. Frontal pelvis shows unremarkable
SI joints and symphysis pubis. Left femoral neck fracture in
subcapsular to transcervical location shows varus angulation. No
worrisome lytic or sclerotic osseous abnormality.
IMPRESSION: Left femoral neck fracture with varus angulation.

## 2019-03-27 IMAGING — RF DG HIP (WITH PELVIS) OPERATIVE*L*
1 series · 2 of 2 positions shown · non-contrast
Comparison: Hip films 12/21/2017.

CLINICAL DATA: LEFT hip fracture.

EXAM:
DG C-ARM 61-120 MIN; OPERATIVE LEFT HIP WITH PELVIS

[Series 1: run · 2 of 2 slices shown]
[im 1/2]
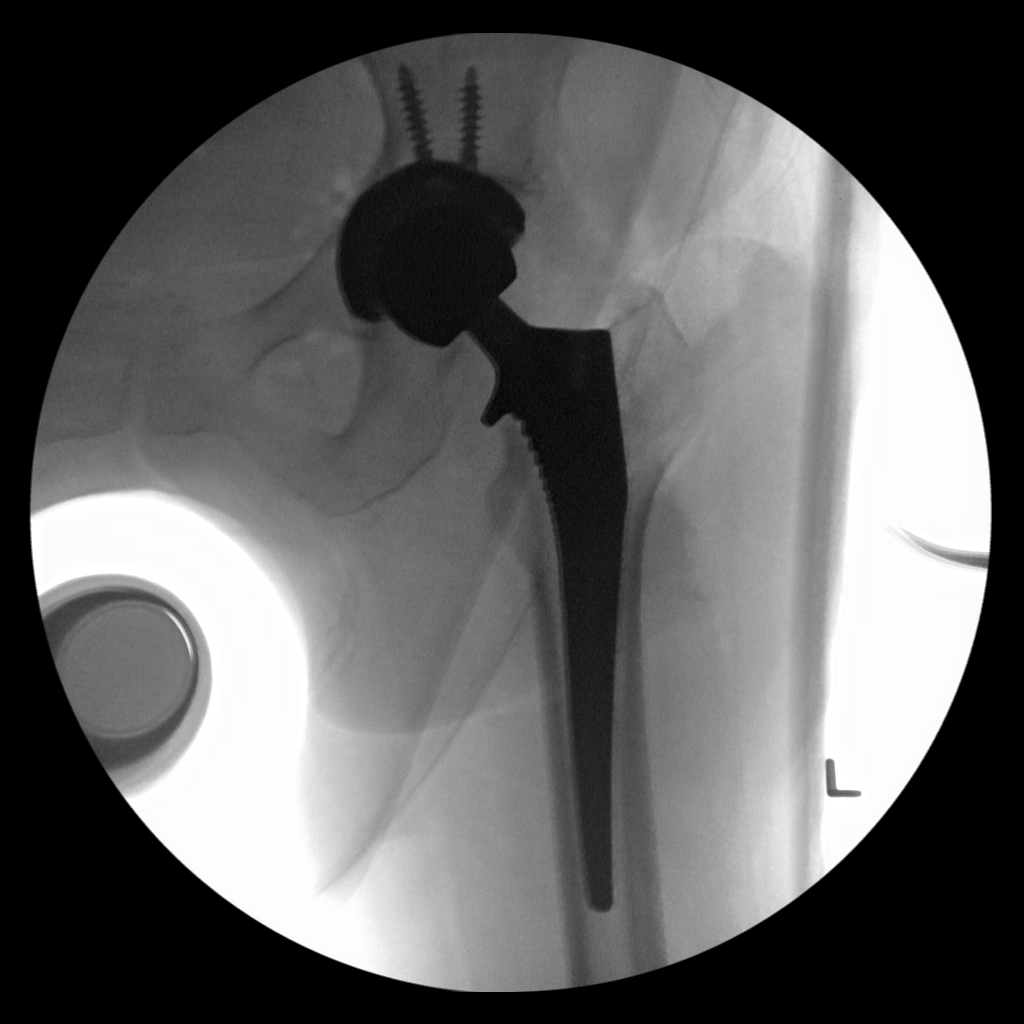
[im 2/2]
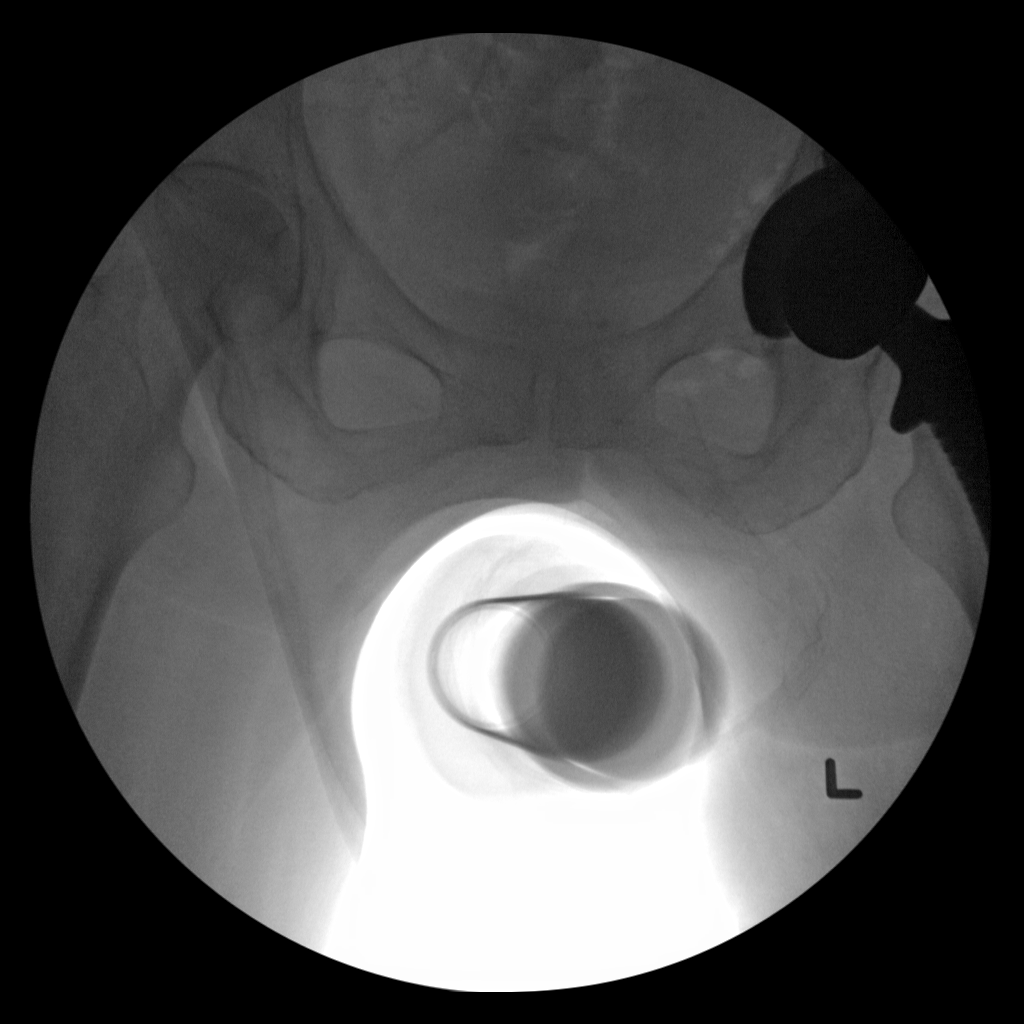

[2 of 2 positions shown; findings below may reference images not displayed]

FINDINGS: LEFT total hip arthroplasty.  Satisfactory position and alignment.
IMPRESSION: No adverse features.

## 2019-03-27 IMAGING — DX DG SHOULDER 1V*L*
2 series · 2 of 2 positions shown · non-contrast
Comparison: 11/29/2017

CLINICAL DATA: Left shoulder fracture.  Follow-up.

EXAM:
LEFT SHOULDER - 1 VIEW

[shoulder obl (1 of 2)]
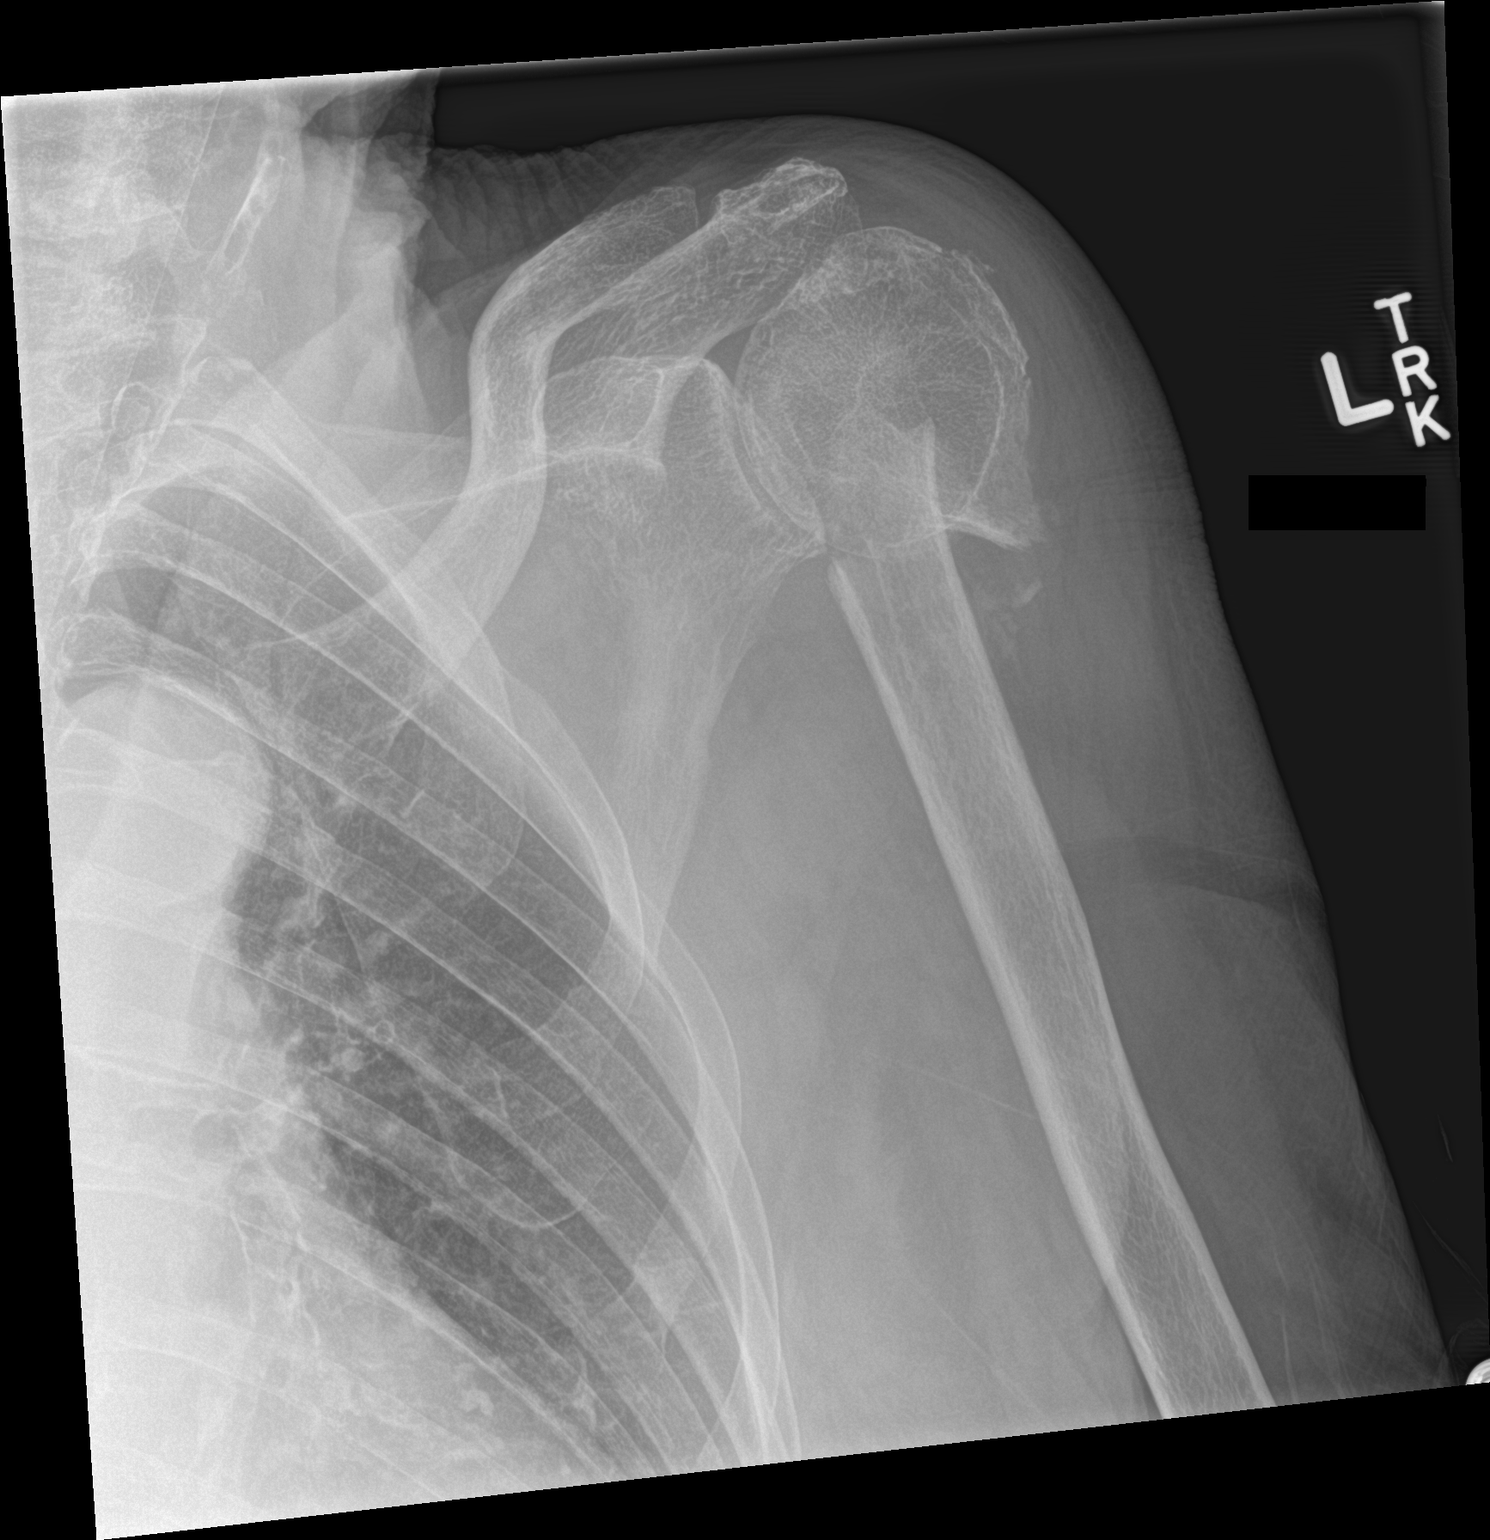

[shoulder obl (2 of 2)]
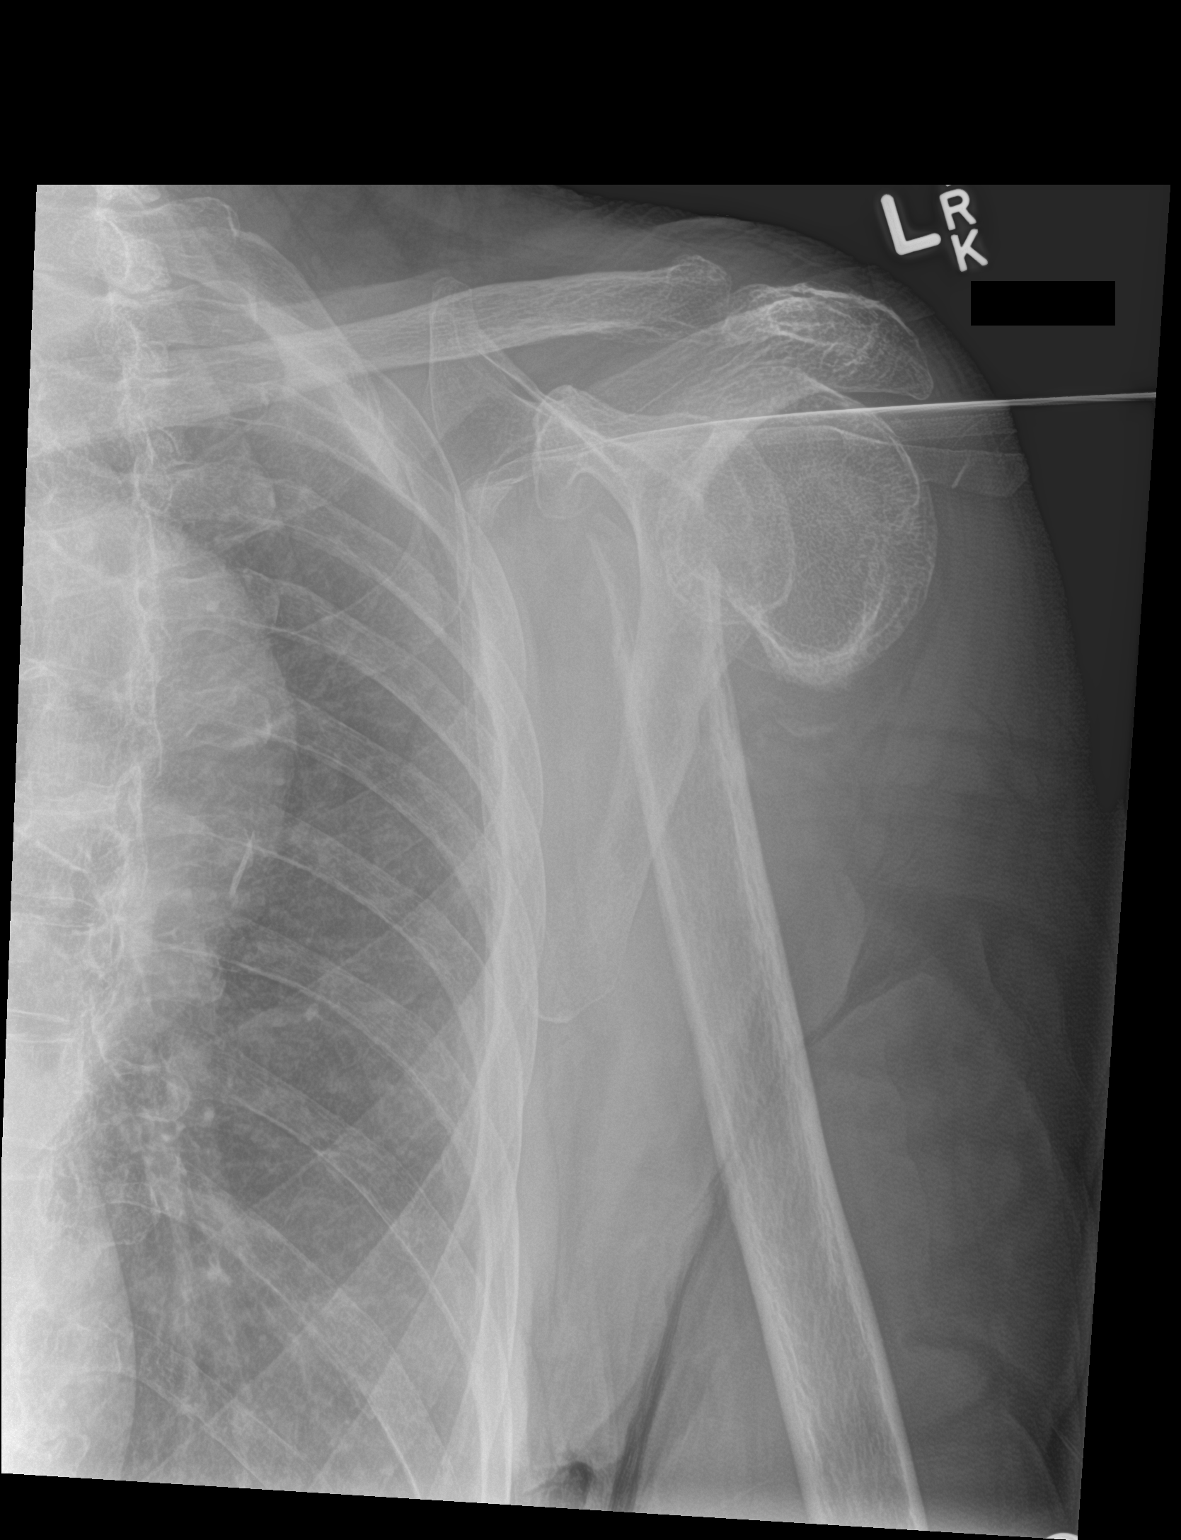

[2 of 2 positions shown; findings below may reference images not displayed]

FINDINGS: Left humeral neck fracture again noted. There is anterior
displacement on the Y-view with significant angulation. This has
changed since prior study. No subluxation or dislocation.
IMPRESSION: Increasing anterior displacement and angulation at the left humeral
neck fracture.

## 2019-07-18 DIAGNOSIS — R1011 Right upper quadrant pain: Secondary | ICD-10-CM | POA: Diagnosis not present

## 2019-07-18 DIAGNOSIS — K760 Fatty (change of) liver, not elsewhere classified: Secondary | ICD-10-CM | POA: Diagnosis not present

## 2019-07-18 DIAGNOSIS — K7689 Other specified diseases of liver: Secondary | ICD-10-CM | POA: Diagnosis not present

## 2019-07-24 DIAGNOSIS — M069 Rheumatoid arthritis, unspecified: Secondary | ICD-10-CM | POA: Diagnosis not present

## 2019-07-24 DIAGNOSIS — K760 Fatty (change of) liver, not elsewhere classified: Secondary | ICD-10-CM | POA: Diagnosis not present

## 2019-07-24 DIAGNOSIS — M3500 Sicca syndrome, unspecified: Secondary | ICD-10-CM | POA: Diagnosis not present

## 2019-08-12 DIAGNOSIS — M25512 Pain in left shoulder: Secondary | ICD-10-CM | POA: Diagnosis not present

## 2019-08-12 DIAGNOSIS — S42215A Unspecified nondisplaced fracture of surgical neck of left humerus, initial encounter for closed fracture: Secondary | ICD-10-CM | POA: Diagnosis not present

## 2019-08-12 DIAGNOSIS — S4992XA Unspecified injury of left shoulder and upper arm, initial encounter: Secondary | ICD-10-CM | POA: Diagnosis not present

## 2019-08-12 DIAGNOSIS — M549 Dorsalgia, unspecified: Secondary | ICD-10-CM | POA: Diagnosis not present

## 2019-08-12 DIAGNOSIS — S299XXA Unspecified injury of thorax, initial encounter: Secondary | ICD-10-CM | POA: Diagnosis not present

## 2019-08-15 ENCOUNTER — Ambulatory Visit: Payer: Self-pay

## 2019-08-15 ENCOUNTER — Encounter: Payer: Self-pay | Admitting: Physician Assistant

## 2019-08-15 ENCOUNTER — Other Ambulatory Visit: Payer: Self-pay

## 2019-08-15 ENCOUNTER — Ambulatory Visit: Payer: Medicare Other | Admitting: Physician Assistant

## 2019-08-15 DIAGNOSIS — M25512 Pain in left shoulder: Secondary | ICD-10-CM

## 2019-08-15 MED ORDER — HYDROCODONE-ACETAMINOPHEN 5-325 MG PO TABS: 1.0000 | ORAL_TABLET | Freq: Four times a day (QID) | ORAL | 0 refills | Status: DC | PRN

## 2019-08-15 NOTE — Progress Notes (Signed)
Office Visit Note   Patient: Connie Snyder           Date of Birth: [birth year]           MRN: [id number] Visit Date: 08/15/2019              Requested by: Merrilee Seashore, [address] [place],  [place] [address] PCP: Merrilee Seashore, MD   Assessment & Plan: Visit Diagnoses:  1. Acute pain of left shoulder     Plan: She is given some Norco to take sparingly at night for severe night pain.  Otherwise recommend activities as tolerated.  Discussed this at length with her son who is present today.  Again they did not want any type of shoulder surgery and this will be a shoulder replacement.  She is able to perform activities of daily living despite the left shoulder decreased range of motion.  She will follow-up with [place] on as-needed basis.  Questions were encouraged and answered  Follow-Up Instructions: No follow-ups on file.   Orders:  Orders Placed This Encounter  Procedures  . XR Shoulder Left   Meds ordered this encounter  Medications  . HYDROcodone-acetaminophen (NORCO) 5-325 MG tablet    Sig: Take 1 tablet by mouth every 6 (six) hours as needed for moderate pain.    Dispense:  20 tablet    Refill:  0      Procedures: No procedures performed   Clinical Data: No additional findings.   Subjective: Chief Complaint  Patient presents with  . Left Shoulder - Pain    HPI  Mrs. Mccullars is well-known to Dr. Ninfa Linden service comes in today due to left shoulder pain status post fall 2 Saturdays ago.  She was reportedly putting on her pants and had a fall.  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  Her son who presents with her today states that her pain is worse at night.  She was doing well with limited range of motion of the left shoulder prior to the fall.  Review of Systems See HPI  Objective: Vital Signs: There were no vitals taken for this visit.  Physical Exam  Constitutional:      Appearance: She is not ill-appearing or diaphoretic.  Pulmonary:     Effort: Pulmonary effort is normal.  Psychiatric:        Mood and Affect: Mood normal.     Ortho Exam She has restricted overhead movement of the left shoulder only, to approximately 80 degrees of forward flexion actively and passively.  Restricted at abduction of the left shoulder.  However internal and external rotation of the left arm causes her no significant pain.  She has good range of motion elbow and hand. Specialty Comments:  No specialty comments available.  Imaging: XR Shoulder Left  Result Date: 08/15/2019 Left shoulder 2 views: Shows the proximal humeral fracture to remain unchanged in overall position alignment.  Given his nonunion.  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  No other fractures identified.    PMFS History: Patient Active Problem List   Diagnosis Date Noted  . Altered mental status 11/08/2018  . Confusion   . Vascular dementia without behavioral disturbance ([place])   . Normocytic normochromic anemia 12/22/2017  . S/p left hip fracture 12/22/2017  . Malnutrition of moderate degree 12/22/2017  . Closed fracture of left hip ([place]) 12/21/2017  . Closed  fracture of left proximal humerus 11/30/2017   Past Medical History:  Diagnosis Date  . Arthritis   . Sjogren's disease ([facility])     Family History  Family history unknown: Yes    Past Surgical History:  Procedure Laterality Date  . TOTAL HIP ARTHROPLASTY Left 12/22/2017   Procedure: TOTAL HIP ARTHROPLASTY ANTERIOR APPROACH;  Surgeon: Tarry Kos, MD;  Location: [facility] OR;  Service: Orthopedics;  Laterality: Left;   Social History   Occupational History  . Not on file  Tobacco Use  . Smoking status: Former Games developer  . Smokeless tobacco: Never Used  Substance and Sexual Activity  . Alcohol use: Yes    Comment: weekly  . Drug use: Not on file  . Sexual activity: Not on file

## 2019-08-20 DIAGNOSIS — M25519 Pain in unspecified shoulder: Secondary | ICD-10-CM | POA: Diagnosis not present

## 2019-08-20 DIAGNOSIS — M3500 Sicca syndrome, unspecified: Secondary | ICD-10-CM | POA: Diagnosis not present

## 2019-08-20 DIAGNOSIS — M549 Dorsalgia, unspecified: Secondary | ICD-10-CM | POA: Diagnosis not present

## 2019-08-20 DIAGNOSIS — Z79899 Other long term (current) drug therapy: Secondary | ICD-10-CM | POA: Diagnosis not present

## 2019-08-20 DIAGNOSIS — M170 Bilateral primary osteoarthritis of knee: Secondary | ICD-10-CM | POA: Diagnosis not present

## 2019-08-20 DIAGNOSIS — M0589 Other rheumatoid arthritis with rheumatoid factor of multiple sites: Secondary | ICD-10-CM | POA: Diagnosis not present

## 2019-11-06 DIAGNOSIS — M3500 Sicca syndrome, unspecified: Secondary | ICD-10-CM | POA: Diagnosis not present

## 2019-11-06 DIAGNOSIS — Z79899 Other long term (current) drug therapy: Secondary | ICD-10-CM | POA: Diagnosis not present

## 2019-11-06 DIAGNOSIS — M170 Bilateral primary osteoarthritis of knee: Secondary | ICD-10-CM | POA: Diagnosis not present

## 2019-11-06 DIAGNOSIS — M25519 Pain in unspecified shoulder: Secondary | ICD-10-CM | POA: Diagnosis not present

## 2019-11-06 DIAGNOSIS — M0589 Other rheumatoid arthritis with rheumatoid factor of multiple sites: Secondary | ICD-10-CM | POA: Diagnosis not present

## 2019-11-07 ENCOUNTER — Ambulatory Visit: Payer: Medicare Other | Admitting: Podiatry

## 2019-12-12 ENCOUNTER — Ambulatory Visit: Payer: Medicare Other | Admitting: Podiatry

## 2019-12-24 ENCOUNTER — Other Ambulatory Visit: Payer: Self-pay

## 2019-12-24 ENCOUNTER — Ambulatory Visit (INDEPENDENT_AMBULATORY_CARE_PROVIDER_SITE_OTHER): Payer: Medicare Other | Admitting: Podiatry

## 2019-12-24 VITALS — BP 126/71 | HR 85 | Temp 98.1°F

## 2019-12-24 DIAGNOSIS — M79674 Pain in right toe(s): Secondary | ICD-10-CM

## 2019-12-24 DIAGNOSIS — M79675 Pain in left toe(s): Secondary | ICD-10-CM

## 2019-12-24 DIAGNOSIS — B351 Tinea unguium: Secondary | ICD-10-CM

## 2019-12-25 NOTE — Progress Notes (Signed)
  Subjective:  Patient ID: Connie Snyder, female    DOB: 1927/04/22,  MRN: [id number]  Chief Complaint  Patient presents with  . Nail Problem    toe nail trim needed bilateral     86 y.o. female presents with the above complaint. History confirmed with patient.  Here today with her son.  It has been quite sometime since they had had her toenails cut.  Objective:  Physical Exam: warm, good capillary refill, no trophic changes or ulcerative lesions, normal DP and PT pulses and normal sensory exam.  Severe onychomycosis x10 to the toenails  Assessment:   1. Pain due to onychomycosis of toenails of both feet   2. Onychomycosis      Plan:  Patient was evaluated and treated and all questions answered.  Discussed the etiology and treatment options for the condition in detail with the patient. Educated patient on the topical and oral treatment options for mycotic nails. Recommended debridement of the nails today. Sharp and mechanical debridement performed of all painful and mycotic nails today. Nails debrided in length and thickness using a nail nipper and a mechanical burr to level of comfort. Discussed treatment options including appropriate shoe gear. Follow up as needed for painful nails.    No follow-ups on file.

## 2020-01-02 ENCOUNTER — Telehealth: Payer: Self-pay | Admitting: Podiatry

## 2020-01-02 NOTE — Telephone Encounter (Signed)
Called patient twice to schedule her next appt with Dr. Lilian Kapur. I was unable to leave a voice message.

## 2020-05-05 DIAGNOSIS — M0589 Other rheumatoid arthritis with rheumatoid factor of multiple sites: Secondary | ICD-10-CM | POA: Diagnosis not present

## 2020-05-05 DIAGNOSIS — M3500 Sicca syndrome, unspecified: Secondary | ICD-10-CM | POA: Diagnosis not present

## 2020-05-05 DIAGNOSIS — M170 Bilateral primary osteoarthritis of knee: Secondary | ICD-10-CM | POA: Diagnosis not present

## 2020-05-05 DIAGNOSIS — M549 Dorsalgia, unspecified: Secondary | ICD-10-CM | POA: Diagnosis not present

## 2020-05-05 DIAGNOSIS — M25519 Pain in unspecified shoulder: Secondary | ICD-10-CM | POA: Diagnosis not present

## 2020-05-05 DIAGNOSIS — Z79899 Other long term (current) drug therapy: Secondary | ICD-10-CM | POA: Diagnosis not present

## 2020-05-14 DIAGNOSIS — Z79899 Other long term (current) drug therapy: Secondary | ICD-10-CM | POA: Diagnosis not present

## 2020-05-14 DIAGNOSIS — M3500 Sicca syndrome, unspecified: Secondary | ICD-10-CM | POA: Diagnosis not present

## 2020-05-14 DIAGNOSIS — M0589 Other rheumatoid arthritis with rheumatoid factor of multiple sites: Secondary | ICD-10-CM | POA: Diagnosis not present

## 2020-05-14 DIAGNOSIS — M069 Rheumatoid arthritis, unspecified: Secondary | ICD-10-CM | POA: Diagnosis not present

## 2020-06-03 DIAGNOSIS — M3500 Sicca syndrome, unspecified: Secondary | ICD-10-CM | POA: Diagnosis not present

## 2020-06-03 DIAGNOSIS — Z0000 Encounter for general adult medical examination without abnormal findings: Secondary | ICD-10-CM | POA: Diagnosis not present

## 2020-06-03 DIAGNOSIS — R011 Cardiac murmur, unspecified: Secondary | ICD-10-CM | POA: Diagnosis not present

## 2020-06-03 DIAGNOSIS — Z23 Encounter for immunization: Secondary | ICD-10-CM | POA: Diagnosis not present

## 2020-06-03 DIAGNOSIS — M069 Rheumatoid arthritis, unspecified: Secondary | ICD-10-CM | POA: Diagnosis not present

## 2020-06-16 ENCOUNTER — Ambulatory Visit: Payer: Medicare Other | Admitting: Podiatry

## 2020-06-18 ENCOUNTER — Other Ambulatory Visit: Payer: Self-pay

## 2020-06-18 ENCOUNTER — Ambulatory Visit: Payer: Medicare Other | Admitting: Podiatry

## 2020-06-18 DIAGNOSIS — M79675 Pain in left toe(s): Secondary | ICD-10-CM

## 2020-06-18 DIAGNOSIS — M79674 Pain in right toe(s): Secondary | ICD-10-CM | POA: Diagnosis not present

## 2020-06-18 DIAGNOSIS — R011 Cardiac murmur, unspecified: Secondary | ICD-10-CM | POA: Diagnosis not present

## 2020-06-18 DIAGNOSIS — B351 Tinea unguium: Secondary | ICD-10-CM

## 2020-06-18 NOTE — Progress Notes (Signed)
  Subjective:  Patient ID: Connie Snyder, female    DOB: [birth year],  MRN: [id number]  Chief Complaint  Patient presents with  . Nail Problem    Nail trim     87 y.o. female returns with the above complaint. History confirmed with patient.  Here today with her son.  It has been quite sometime since they had had her toenails cut.  Objective:  Physical Exam: warm, good capillary refill, no trophic changes or ulcerative lesions, normal DP and PT pulses and normal sensory exam.  Severe onychomycosis x10 to the toenails  Assessment:   1. Pain due to onychomycosis of toenails of both feet   2. Onychomycosis      Plan:  Patient was evaluated and treated and all questions answered.  Discussed the etiology and treatment options for the condition in detail with the patient. Educated patient on the topical and oral treatment options for mycotic nails. Recommended debridement of the nails today. Sharp and mechanical debridement performed of all painful and mycotic nails today. Nails debrided in length and thickness using a nail nipper and a mechanical burr to level of comfort. Discussed treatment options including appropriate shoe gear. Follow up as needed for painful nails.    Return in about 4 months (around 10/18/2020) for painful nails.  Marland Kitchen

## 2020-10-20 ENCOUNTER — Other Ambulatory Visit: Payer: Self-pay

## 2020-10-20 ENCOUNTER — Ambulatory Visit: Payer: Medicare Other | Admitting: Podiatry

## 2020-10-20 DIAGNOSIS — B351 Tinea unguium: Secondary | ICD-10-CM

## 2020-10-20 DIAGNOSIS — M79675 Pain in left toe(s): Secondary | ICD-10-CM

## 2020-10-20 DIAGNOSIS — M79674 Pain in right toe(s): Secondary | ICD-10-CM | POA: Diagnosis not present

## 2020-10-23 ENCOUNTER — Encounter: Payer: Self-pay | Admitting: Podiatry

## 2020-10-23 NOTE — Progress Notes (Signed)
  Subjective:  Patient ID: Connie Snyder, female    DOB: 01-Feb-1928,  MRN: [id number]  Chief Complaint  Patient presents with   Nail Problem    Thick painful toenails, 4 month follow up    87 y.o. female returns with the above complaint. History confirmed with patient.  Here today with her son.  It has been quite sometime since they had had her toenails cut.  Objective:  Physical Exam: warm, good capillary refill, no trophic changes or ulcerative lesions, normal DP and PT pulses and normal sensory exam.  Severe onychomycosis x10 to the toenails  Assessment:   1. Pain due to onychomycosis of toenails of both feet       Plan:  Patient was evaluated and treated and all questions answered.  Discussed the etiology and treatment options for the condition in detail with the patient. Educated patient on the topical and oral treatment options for mycotic nails. Recommended debridement of the nails today. Sharp and mechanical debridement performed of all painful and mycotic nails today. Nails debrided in length and thickness using a nail nipper and a mechanical burr to level of comfort. Discussed treatment options including appropriate shoe gear. Follow up as needed for painful nails.    No follow-ups on file.  Marland Kitchen

## 2020-12-24 DIAGNOSIS — G479 Sleep disorder, unspecified: Secondary | ICD-10-CM | POA: Diagnosis not present

## 2020-12-24 DIAGNOSIS — Z23 Encounter for immunization: Secondary | ICD-10-CM | POA: Diagnosis not present

## 2021-02-15 DIAGNOSIS — M25519 Pain in unspecified shoulder: Secondary | ICD-10-CM | POA: Diagnosis not present

## 2021-02-15 DIAGNOSIS — M0589 Other rheumatoid arthritis with rheumatoid factor of multiple sites: Secondary | ICD-10-CM | POA: Diagnosis not present

## 2021-02-15 DIAGNOSIS — M3500 Sicca syndrome, unspecified: Secondary | ICD-10-CM | POA: Diagnosis not present

## 2021-02-15 DIAGNOSIS — M170 Bilateral primary osteoarthritis of knee: Secondary | ICD-10-CM | POA: Diagnosis not present

## 2021-02-15 DIAGNOSIS — I38 Endocarditis, valve unspecified: Secondary | ICD-10-CM | POA: Diagnosis not present

## 2021-02-15 DIAGNOSIS — Z79899 Other long term (current) drug therapy: Secondary | ICD-10-CM | POA: Diagnosis not present

## 2021-02-15 DIAGNOSIS — M549 Dorsalgia, unspecified: Secondary | ICD-10-CM | POA: Diagnosis not present

## 2021-03-27 DIAGNOSIS — Y92009 Unspecified place in unspecified non-institutional (private) residence as the place of occurrence of the external cause: Secondary | ICD-10-CM | POA: Diagnosis not present

## 2021-03-27 DIAGNOSIS — I083 Combined rheumatic disorders of mitral, aortic and tricuspid valves: Secondary | ICD-10-CM | POA: Diagnosis not present

## 2021-03-27 DIAGNOSIS — D649 Anemia, unspecified: Secondary | ICD-10-CM | POA: Diagnosis not present

## 2021-03-27 DIAGNOSIS — I700 Atherosclerosis of aorta: Secondary | ICD-10-CM | POA: Diagnosis not present

## 2021-03-27 DIAGNOSIS — Z791 Long term (current) use of non-steroidal anti-inflammatories (NSAID): Secondary | ICD-10-CM | POA: Diagnosis not present

## 2021-03-27 DIAGNOSIS — I10 Essential (primary) hypertension: Secondary | ICD-10-CM | POA: Diagnosis not present

## 2021-03-27 DIAGNOSIS — F0393 Unspecified dementia, unspecified severity, with mood disturbance: Secondary | ICD-10-CM | POA: Diagnosis not present

## 2021-03-27 DIAGNOSIS — R079 Chest pain, unspecified: Secondary | ICD-10-CM | POA: Diagnosis not present

## 2021-03-27 DIAGNOSIS — R2243 Localized swelling, mass and lump, lower limb, bilateral: Secondary | ICD-10-CM | POA: Diagnosis not present

## 2021-03-27 DIAGNOSIS — M48061 Spinal stenosis, lumbar region without neurogenic claudication: Secondary | ICD-10-CM | POA: Diagnosis not present

## 2021-03-27 DIAGNOSIS — M4856XA Collapsed vertebra, not elsewhere classified, lumbar region, initial encounter for fracture: Secondary | ICD-10-CM | POA: Diagnosis not present

## 2021-03-27 DIAGNOSIS — E876 Hypokalemia: Secondary | ICD-10-CM | POA: Diagnosis not present

## 2021-03-27 DIAGNOSIS — M4854XA Collapsed vertebra, not elsewhere classified, thoracic region, initial encounter for fracture: Secondary | ICD-10-CM | POA: Diagnosis not present

## 2021-03-27 DIAGNOSIS — J9811 Atelectasis: Secondary | ICD-10-CM | POA: Diagnosis not present

## 2021-03-27 DIAGNOSIS — D509 Iron deficiency anemia, unspecified: Secondary | ICD-10-CM | POA: Diagnosis not present

## 2021-03-27 DIAGNOSIS — I2699 Other pulmonary embolism without acute cor pulmonale: Secondary | ICD-10-CM | POA: Diagnosis not present

## 2021-03-27 DIAGNOSIS — M069 Rheumatoid arthritis, unspecified: Secondary | ICD-10-CM | POA: Diagnosis not present

## 2021-03-27 DIAGNOSIS — S22008A Other fracture of unspecified thoracic vertebra, initial encounter for closed fracture: Secondary | ICD-10-CM | POA: Diagnosis not present

## 2021-03-27 DIAGNOSIS — I774 Celiac artery compression syndrome: Secondary | ICD-10-CM | POA: Diagnosis not present

## 2021-03-27 DIAGNOSIS — R0789 Other chest pain: Secondary | ICD-10-CM | POA: Diagnosis not present

## 2021-03-28 DIAGNOSIS — R600 Localized edema: Secondary | ICD-10-CM | POA: Diagnosis not present

## 2021-03-28 DIAGNOSIS — I2699 Other pulmonary embolism without acute cor pulmonale: Secondary | ICD-10-CM | POA: Diagnosis not present

## 2021-03-28 DIAGNOSIS — R079 Chest pain, unspecified: Secondary | ICD-10-CM | POA: Diagnosis not present

## 2021-03-28 DIAGNOSIS — S22000A Wedge compression fracture of unspecified thoracic vertebra, initial encounter for closed fracture: Secondary | ICD-10-CM | POA: Diagnosis not present

## 2021-03-28 DIAGNOSIS — E876 Hypokalemia: Secondary | ICD-10-CM | POA: Diagnosis not present

## 2021-03-28 DIAGNOSIS — D649 Anemia, unspecified: Secondary | ICD-10-CM | POA: Diagnosis not present

## 2021-03-29 DIAGNOSIS — S22070A Wedge compression fracture of T9-T10 vertebra, initial encounter for closed fracture: Secondary | ICD-10-CM | POA: Diagnosis not present

## 2021-03-29 DIAGNOSIS — R079 Chest pain, unspecified: Secondary | ICD-10-CM | POA: Diagnosis not present

## 2021-03-29 DIAGNOSIS — S32010A Wedge compression fracture of first lumbar vertebra, initial encounter for closed fracture: Secondary | ICD-10-CM | POA: Diagnosis not present

## 2021-03-29 DIAGNOSIS — S22000A Wedge compression fracture of unspecified thoracic vertebra, initial encounter for closed fracture: Secondary | ICD-10-CM | POA: Diagnosis not present

## 2021-03-29 DIAGNOSIS — S32040A Wedge compression fracture of fourth lumbar vertebra, initial encounter for closed fracture: Secondary | ICD-10-CM | POA: Diagnosis not present

## 2021-03-29 DIAGNOSIS — D649 Anemia, unspecified: Secondary | ICD-10-CM | POA: Diagnosis not present

## 2021-03-29 DIAGNOSIS — S32020A Wedge compression fracture of second lumbar vertebra, initial encounter for closed fracture: Secondary | ICD-10-CM | POA: Diagnosis not present

## 2021-03-29 DIAGNOSIS — I2699 Other pulmonary embolism without acute cor pulmonale: Secondary | ICD-10-CM | POA: Diagnosis not present

## 2021-03-29 DIAGNOSIS — E876 Hypokalemia: Secondary | ICD-10-CM | POA: Diagnosis not present

## 2021-03-30 DIAGNOSIS — R079 Chest pain, unspecified: Secondary | ICD-10-CM | POA: Diagnosis not present

## 2021-03-30 DIAGNOSIS — S22000A Wedge compression fracture of unspecified thoracic vertebra, initial encounter for closed fracture: Secondary | ICD-10-CM | POA: Diagnosis not present

## 2021-03-30 DIAGNOSIS — I2699 Other pulmonary embolism without acute cor pulmonale: Secondary | ICD-10-CM | POA: Diagnosis not present

## 2021-03-30 DIAGNOSIS — D649 Anemia, unspecified: Secondary | ICD-10-CM | POA: Diagnosis not present

## 2021-03-30 DIAGNOSIS — E876 Hypokalemia: Secondary | ICD-10-CM | POA: Diagnosis not present

## 2021-03-31 DIAGNOSIS — S22000A Wedge compression fracture of unspecified thoracic vertebra, initial encounter for closed fracture: Secondary | ICD-10-CM | POA: Diagnosis not present

## 2021-03-31 DIAGNOSIS — M4856XA Collapsed vertebra, not elsewhere classified, lumbar region, initial encounter for fracture: Secondary | ICD-10-CM | POA: Diagnosis not present

## 2021-03-31 DIAGNOSIS — R079 Chest pain, unspecified: Secondary | ICD-10-CM | POA: Diagnosis not present

## 2021-03-31 DIAGNOSIS — D649 Anemia, unspecified: Secondary | ICD-10-CM | POA: Diagnosis not present

## 2021-03-31 DIAGNOSIS — E876 Hypokalemia: Secondary | ICD-10-CM | POA: Diagnosis not present

## 2021-03-31 DIAGNOSIS — I2699 Other pulmonary embolism without acute cor pulmonale: Secondary | ICD-10-CM | POA: Diagnosis not present

## 2021-03-31 DIAGNOSIS — M4854XA Collapsed vertebra, not elsewhere classified, thoracic region, initial encounter for fracture: Secondary | ICD-10-CM | POA: Diagnosis not present

## 2021-04-01 DIAGNOSIS — I2699 Other pulmonary embolism without acute cor pulmonale: Secondary | ICD-10-CM | POA: Diagnosis not present

## 2021-04-01 DIAGNOSIS — E876 Hypokalemia: Secondary | ICD-10-CM | POA: Diagnosis not present

## 2021-04-01 DIAGNOSIS — D649 Anemia, unspecified: Secondary | ICD-10-CM | POA: Diagnosis not present

## 2021-04-01 DIAGNOSIS — R079 Chest pain, unspecified: Secondary | ICD-10-CM | POA: Diagnosis not present

## 2021-04-01 DIAGNOSIS — S22000A Wedge compression fracture of unspecified thoracic vertebra, initial encounter for closed fracture: Secondary | ICD-10-CM | POA: Diagnosis not present

## 2021-04-02 DIAGNOSIS — D649 Anemia, unspecified: Secondary | ICD-10-CM | POA: Diagnosis not present

## 2021-04-02 DIAGNOSIS — I2699 Other pulmonary embolism without acute cor pulmonale: Secondary | ICD-10-CM | POA: Diagnosis not present

## 2021-04-02 DIAGNOSIS — R079 Chest pain, unspecified: Secondary | ICD-10-CM | POA: Diagnosis not present

## 2021-04-02 DIAGNOSIS — S22000A Wedge compression fracture of unspecified thoracic vertebra, initial encounter for closed fracture: Secondary | ICD-10-CM | POA: Diagnosis not present

## 2021-04-02 DIAGNOSIS — E876 Hypokalemia: Secondary | ICD-10-CM | POA: Diagnosis not present

## 2021-04-03 DIAGNOSIS — D649 Anemia, unspecified: Secondary | ICD-10-CM | POA: Diagnosis not present

## 2021-04-03 DIAGNOSIS — S22000A Wedge compression fracture of unspecified thoracic vertebra, initial encounter for closed fracture: Secondary | ICD-10-CM | POA: Diagnosis not present

## 2021-04-03 DIAGNOSIS — I2699 Other pulmonary embolism without acute cor pulmonale: Secondary | ICD-10-CM | POA: Diagnosis not present

## 2021-04-03 DIAGNOSIS — E876 Hypokalemia: Secondary | ICD-10-CM | POA: Diagnosis not present

## 2021-04-03 DIAGNOSIS — R079 Chest pain, unspecified: Secondary | ICD-10-CM | POA: Diagnosis not present

## 2021-04-07 DIAGNOSIS — Z09 Encounter for follow-up examination after completed treatment for conditions other than malignant neoplasm: Secondary | ICD-10-CM | POA: Diagnosis not present

## 2021-04-07 DIAGNOSIS — I2699 Other pulmonary embolism without acute cor pulmonale: Secondary | ICD-10-CM | POA: Diagnosis not present

## 2021-04-07 DIAGNOSIS — R2681 Unsteadiness on feet: Secondary | ICD-10-CM | POA: Diagnosis not present

## 2021-04-07 DIAGNOSIS — R29898 Other symptoms and signs involving the musculoskeletal system: Secondary | ICD-10-CM | POA: Diagnosis not present

## 2021-04-07 DIAGNOSIS — R531 Weakness: Secondary | ICD-10-CM | POA: Diagnosis not present

## 2021-04-26 ENCOUNTER — Encounter (HOSPITAL_COMMUNITY): Payer: Self-pay

## 2021-04-26 ENCOUNTER — Emergency Department (HOSPITAL_COMMUNITY): Payer: Medicare Other

## 2021-04-26 ENCOUNTER — Inpatient Hospital Stay (HOSPITAL_COMMUNITY)
Admission: EM | Admit: 2021-04-26 | Discharge: 2021-04-29 | DRG: 605 | Disposition: A | Discharge status: Home Health | Payer: Medicare Other | Attending: Family Medicine | Admitting: Family Medicine

## 2021-04-26 ENCOUNTER — Other Ambulatory Visit: Payer: Self-pay

## 2021-04-26 DIAGNOSIS — I2699 Other pulmonary embolism without acute cor pulmonale: Secondary | ICD-10-CM

## 2021-04-26 DIAGNOSIS — F0150 Vascular dementia without behavioral disturbance: Secondary | ICD-10-CM | POA: Diagnosis present

## 2021-04-26 DIAGNOSIS — Z7901 Long term (current) use of anticoagulants: Secondary | ICD-10-CM

## 2021-04-26 DIAGNOSIS — M25572 Pain in left ankle and joints of left foot: Secondary | ICD-10-CM | POA: Diagnosis not present

## 2021-04-26 DIAGNOSIS — E611 Iron deficiency: Secondary | ICD-10-CM | POA: Diagnosis not present

## 2021-04-26 DIAGNOSIS — Z888 Allergy status to other drugs, medicaments and biological substances status: Secondary | ICD-10-CM

## 2021-04-26 DIAGNOSIS — S7012XA Contusion of left thigh, initial encounter: Secondary | ICD-10-CM | POA: Diagnosis not present

## 2021-04-26 DIAGNOSIS — R109 Unspecified abdominal pain: Secondary | ICD-10-CM | POA: Diagnosis not present

## 2021-04-26 DIAGNOSIS — Z20822 Contact with and (suspected) exposure to covid-19: Secondary | ICD-10-CM | POA: Diagnosis present

## 2021-04-26 DIAGNOSIS — Y92000 Kitchen of unspecified non-institutional (private) residence as  the place of occurrence of the external cause: Secondary | ICD-10-CM | POA: Diagnosis not present

## 2021-04-26 DIAGNOSIS — Z87891 Personal history of nicotine dependence: Secondary | ICD-10-CM

## 2021-04-26 DIAGNOSIS — S7011XA Contusion of right thigh, initial encounter: Secondary | ICD-10-CM | POA: Diagnosis not present

## 2021-04-26 DIAGNOSIS — S7001XA Contusion of right hip, initial encounter: Secondary | ICD-10-CM | POA: Diagnosis not present

## 2021-04-26 DIAGNOSIS — Z79899 Other long term (current) drug therapy: Secondary | ICD-10-CM

## 2021-04-26 DIAGNOSIS — M069 Rheumatoid arthritis, unspecified: Secondary | ICD-10-CM | POA: Diagnosis present

## 2021-04-26 DIAGNOSIS — S3992XA Unspecified injury of lower back, initial encounter: Secondary | ICD-10-CM | POA: Diagnosis not present

## 2021-04-26 DIAGNOSIS — S7010XA Contusion of unspecified thigh, initial encounter: Secondary | ICD-10-CM | POA: Diagnosis not present

## 2021-04-26 DIAGNOSIS — M3500 Sicca syndrome, unspecified: Secondary | ICD-10-CM | POA: Diagnosis present

## 2021-04-26 DIAGNOSIS — D62 Acute posthemorrhagic anemia: Secondary | ICD-10-CM | POA: Diagnosis not present

## 2021-04-26 DIAGNOSIS — Z86711 Personal history of pulmonary embolism: Secondary | ICD-10-CM

## 2021-04-26 DIAGNOSIS — W1830XA Fall on same level, unspecified, initial encounter: Secondary | ICD-10-CM | POA: Diagnosis present

## 2021-04-26 DIAGNOSIS — Z96642 Presence of left artificial hip joint: Secondary | ICD-10-CM | POA: Diagnosis not present

## 2021-04-26 DIAGNOSIS — M1990 Unspecified osteoarthritis, unspecified site: Secondary | ICD-10-CM | POA: Diagnosis present

## 2021-04-26 DIAGNOSIS — M25551 Pain in right hip: Secondary | ICD-10-CM

## 2021-04-26 DIAGNOSIS — K922 Gastrointestinal hemorrhage, unspecified: Secondary | ICD-10-CM

## 2021-04-26 DIAGNOSIS — Z743 Need for continuous supervision: Secondary | ICD-10-CM | POA: Diagnosis not present

## 2021-04-26 DIAGNOSIS — R0902 Hypoxemia: Secondary | ICD-10-CM | POA: Diagnosis not present

## 2021-04-26 DIAGNOSIS — R404 Transient alteration of awareness: Secondary | ICD-10-CM | POA: Diagnosis not present

## 2021-04-26 DIAGNOSIS — R55 Syncope and collapse: Secondary | ICD-10-CM | POA: Diagnosis not present

## 2021-04-26 LAB — CBC
HCT: 19.2 % — ABNORMAL LOW (ref 36.0–46.0)
Hemoglobin: 6.6 g/dL — CL (ref 12.0–15.0)
MCH: 29.2 pg (ref 26.0–34.0)
MCHC: 34.4 g/dL (ref 30.0–36.0)
MCV: 85 fL (ref 80.0–100.0)
Platelets: 221 10*3/uL (ref 150–400)
RBC: 2.26 MIL/uL — ABNORMAL LOW (ref 3.87–5.11)
RDW: 16.1 % — ABNORMAL HIGH (ref 11.5–15.5)
WBC: 10.8 10*3/uL — ABNORMAL HIGH (ref 4.0–10.5)
nRBC: 0.2 % (ref 0.0–0.2)

## 2021-04-26 LAB — CBC WITH DIFFERENTIAL/PLATELET
Abs Immature Granulocytes: 0.02 10*3/uL (ref 0.00–0.07)
Basophils Absolute: 0 10*3/uL (ref 0.0–0.1)
Basophils Relative: 0 %
Eosinophils Absolute: 0.1 10*3/uL (ref 0.0–0.5)
Eosinophils Relative: 1 %
HCT: 22 % — ABNORMAL LOW (ref 36.0–46.0)
Hemoglobin: 7.6 g/dL — ABNORMAL LOW (ref 12.0–15.0)
Immature Granulocytes: 0 %
Lymphocytes Relative: 17 %
Lymphs Abs: 1.5 10*3/uL (ref 0.7–4.0)
MCH: 28.8 pg (ref 26.0–34.0)
MCHC: 34.5 g/dL (ref 30.0–36.0)
MCV: 83.3 fL (ref 80.0–100.0)
Monocytes Absolute: 0.8 10*3/uL (ref 0.1–1.0)
Monocytes Relative: 9 %
Neutro Abs: 6.3 10*3/uL (ref 1.7–7.7)
Neutrophils Relative %: 73 %
Platelets: 253 10*3/uL (ref 150–400)
RBC: 2.64 MIL/uL — ABNORMAL LOW (ref 3.87–5.11)
RDW: 16 % — ABNORMAL HIGH (ref 11.5–15.5)
WBC: 8.7 10*3/uL (ref 4.0–10.5)
nRBC: 0.2 % (ref 0.0–0.2)

## 2021-04-26 LAB — POC OCCULT BLOOD, ED: Fecal Occult Bld: POSITIVE — AB

## 2021-04-26 LAB — COMPREHENSIVE METABOLIC PANEL
ALT: 17 U/L (ref 0–44)
AST: 24 U/L (ref 15–41)
Albumin: 3.1 g/dL — ABNORMAL LOW (ref 3.5–5.0)
Alkaline Phosphatase: 66 U/L (ref 38–126)
Anion gap: 6 (ref 5–15)
BUN: 26 mg/dL — ABNORMAL HIGH (ref 8–23)
CO2: 24 mmol/L (ref 22–32)
Calcium: 8.6 mg/dL — ABNORMAL LOW (ref 8.9–10.3)
Chloride: 107 mmol/L (ref 98–111)
Creatinine, Ser: 0.71 mg/dL (ref 0.44–1.00)
GFR, Estimated: >60 mL/min (ref >60)
Glucose, Bld: 138 mg/dL — ABNORMAL HIGH (ref 70–99)
Potassium: 4 mmol/L (ref 3.5–5.1)
Sodium: 137 mmol/L (ref 135–145)
Total Bilirubin: 0.8 mg/dL (ref 0.3–1.2)
Total Protein: 6.1 g/dL — ABNORMAL LOW (ref 6.5–8.1)

## 2021-04-26 LAB — RESP PANEL BY RT-PCR (FLU A&B, COVID) ARPGX2
Influenza A by PCR: NEGATIVE
Influenza B by PCR: NEGATIVE
SARS Coronavirus 2 by RT PCR: NEGATIVE

## 2021-04-26 LAB — PROTIME-INR
INR: 1.4 — ABNORMAL HIGH (ref 0.8–1.2)
Prothrombin Time: 17.1 seconds — ABNORMAL HIGH (ref 11.4–15.2)

## 2021-04-26 MED ORDER — ACETAMINOPHEN 650 MG RE SUPP: 650.0000 mg | Freq: Four times a day (QID) | RECTAL | Status: DC | PRN

## 2021-04-26 MED ORDER — MORPHINE SULFATE (PF) 2 MG/ML IV SOLN: 2.0000 mg | Freq: Once | INTRAVENOUS | Status: DC

## 2021-04-26 MED ORDER — ACETAMINOPHEN 500 MG PO TABS
1000.0000 mg | ORAL_TABLET | Freq: Once | ORAL | Status: AC
  Administered 2021-04-26: 1000 mg via ORAL
  Filled 2021-04-26: qty 2

## 2021-04-26 MED ORDER — MORPHINE SULFATE (PF) 2 MG/ML IV SOLN
0.5000 mg | INTRAVENOUS | Status: DC | PRN
  Administered 2021-04-26: 0.5 mg via INTRAVENOUS
  Filled 2021-04-26: qty 1

## 2021-04-26 MED ORDER — SODIUM CHLORIDE 0.9 % IV BOLUS
1000.0000 mL | Freq: Once | INTRAVENOUS | Status: AC
  Administered 2021-04-26: 1000 mL via INTRAVENOUS

## 2021-04-26 MED ORDER — ACETAMINOPHEN 325 MG PO TABS: 650.0000 mg | ORAL_TABLET | Freq: Four times a day (QID) | ORAL | Status: DC | PRN

## 2021-04-26 MED ORDER — PANTOPRAZOLE SODIUM 40 MG IV SOLR
40.0000 mg | Freq: Once | INTRAVENOUS | Status: AC
  Administered 2021-04-26: 40 mg via INTRAVENOUS
  Filled 2021-04-26: qty 40

## 2021-04-26 MED ORDER — MORPHINE SULFATE (PF) 2 MG/ML IV SOLN
2.0000 mg | Freq: Once | INTRAVENOUS | Status: AC
  Administered 2021-04-26: 2 mg via INTRAVENOUS
  Filled 2021-04-26: qty 1

## 2021-04-26 NOTE — H&P (Signed)
History and Physical    Connie Snyder [id number] DOB: 03/21/1928 DOA: 04/26/2021  PCP: Merrilee Seashore, MD  Patient coming from: Home.  Chief Complaint: Loss of consciousness.  History obtained from patient's son and patient's daughter.  Patient has dementia.  HPI: Connie Snyder is a 88 y.o. female with history of advanced dementia, rheumatoid arthritis and Sjogren's syndrome was brought to the ER after patient had a syncopal episode when patient's son tried to ambulate her today.  Patient's son states that yesterday patient had a fall in the kitchen following which patient found it difficult to walk due to pain in the right hip area.  She was lying on the bed since yesterday after the fall and today when he tried to help her ambulate she found it difficult and briefly lost consciousness for a few minutes.  As per the patient's family patient had been in [place] for Christmas about a month ago over there patient complained of some chest pain was taken to the ER and was diagnosed with pulmonary embolism and was started on Eliquis.  Patient last dose of Eliquis almost 24 hours ago.  ED Course: In the ER patient is noticed to have large swelling around the right hip area.  Tender to touch warm.  Patient had CT head abdomen and hip area which shows right sided large hematoma.  Patient's hemoglobin has dropped from 12.3 in 2020 it is around 7.6.  Patient admitted for bleeding in the setting of Eliquis.  COVID test was negative.  Review of Systems: As per HPI, rest all negative.   Past Medical History:  Diagnosis Date   Arthritis    Sjogren's disease [facility])     Past Surgical History:  Procedure Laterality Date   TOTAL HIP ARTHROPLASTY Left 12/22/2017   Procedure: TOTAL HIP ARTHROPLASTY ANTERIOR APPROACH;  Surgeon: Leandrew Koyanagi, MD;  Location: [facility];  Service: Orthopedics;  Laterality: Left;     reports that she has quit smoking. She has never used smokeless tobacco. She  reports current alcohol use. No history on file for drug use.  Allergies  Allergen Reactions   Aricept [Donepezil] Nausea And Vomiting    Family History  Problem Relation Age of Onset   Pulmonary embolism Other     Prior to Admission medications   Medication Sig Start Date End Date Taking? Authorizing Provider  celecoxib (CELEBREX) 100 MG capsule Take 100 mg by mouth in the morning and at bedtime.   Yes [provider]  Cholecalciferol (VITAMIN D-3) 25 MCG (1000 UT) CAPS Take 1,000 Units by mouth daily.   Yes [provider]  docusate sodium (COLACE) 100 MG capsule Take 100 mg by mouth daily.   Yes [provider]  ELIQUIS 5 MG TABS tablet Take 5 mg by mouth in the morning and at bedtime.   Yes [provider]  feeding supplement (BOOST HIGH PROTEIN) LIQD Take 1 Container by mouth 2 (two) times daily between meals.   Yes [provider]  ferrous sulfate 325 (65 FE) MG tablet Take 325 mg by mouth daily with breakfast.   Yes [provider]  Folic Acid-Vit [id number] (FOLBEE) 2.5-25-1 MG TABS tablet Take 1 tablet by mouth daily.   Yes [provider]  hydrOXYzine (ATARAX) 25 MG tablet Take 25 mg by mouth 2 (two) times daily. 04/17/21  Yes [provider]  mirtazapine (REMERON) 30 MG tablet Take 30 mg by mouth at bedtime.  11/01/17  Yes [provider]  Multiple Vitamins-Minerals (MULTIVITAMIN WOMEN 50+) TABS Take 1 tablet by mouth daily with breakfast.   Yes [provider]  baclofen (LIORESAL) 10 MG tablet Take 0.5 tablets (5 mg total) by mouth 3 (three) times daily as needed for muscle spasms. Patient not taking: Reported on 04/26/2021 06/26/18   Tarry Kos, MD  HYDROcodone-acetaminophen (NORCO) 5-325 MG tablet Take 1 tablet by mouth every 6 (six) hours as needed for moderate pain. Patient not taking: Reported on 04/26/2021 08/15/19   Kirtland Bouchard, PA-C  methotrexate (RHEUMATREX) 2.5 MG tablet Take  10 mg by mouth every Sunday.  Patient not taking: Reported on 04/26/2021 11/13/17   [provider]  ondansetron (ZOFRAN) 4 MG tablet Take 1 tablet (4 mg total) by mouth every 6 (six) hours as needed for nausea. Patient not taking: Reported on 04/26/2021 12/28/17   Elgergawy, Leana Roe, MD  potassium chloride SA (K-DUR) 20 MEQ tablet Take 2 tablets (40 mEq total) by mouth daily for 3 days. Patient not taking: Reported on 04/26/2021 11/09/18 04/26/21  Margie Ege A, DO  senna-docusate (SENOKOT-S) 8.6-50 MG tablet Take 1 tablet by mouth at bedtime as needed for mild constipation. Patient not taking: Reported on 04/26/2021 12/28/17   Elgergawy, Leana Roe, MD    Physical Exam: Constitutional: Moderately built and nourished. Vitals:   04/26/21 1900 04/26/21 1915 04/26/21 2030 04/26/21 2100  BP: 122/60  103/73 96/64  Pulse: 79 81 100 81  Resp: (!) 21 12 17  (!) 23  Temp:      TempSrc:      SpO2: 100% 97% 100% 98%  Weight:      Height:       Eyes: Anicteric no pallor. ENMT: No discharge from the ears eyes nose and mouth. Neck: No mass felt.  No neck rigidity. Respiratory: No rhonchi or crepitations. Cardiovascular: S1-S2 heard. Abdomen: Soft nontender bowel sound present. Musculoskeletal: Large swelling around the right hip area. Skin: Chronic skin changes. Neurologic: Alert awake oriented to her name only.  Moving all extremities. Psychiatric: Oriented to her name.   Labs on Admission: I have personally reviewed following labs and imaging studies  CBC: Recent Labs  Lab 04/26/21 1235  WBC 8.7  NEUTROABS 6.3  HGB 7.6*  HCT 22.0*  MCV 83.3  PLT 253   Basic Metabolic Panel: Recent Labs  Lab 04/26/21 1235  NA 137  K 4.0  CL 107  CO2 24  GLUCOSE 138*  BUN 26*  CREATININE 0.71  CALCIUM 8.6*   GFR: Estimated Creatinine Clearance: 39.3 mL/min (by C-G formula based on SCr of 0.71 mg/dL). Liver Function Tests: Recent Labs  Lab 04/26/21 1235  AST 24  ALT 17  ALKPHOS 66   BILITOT 0.8  PROT 6.1*  ALBUMIN 3.1*   No results for input(s): LIPASE, AMYLASE in the last 168 hours. No results for input(s): AMMONIA in the last 168 hours. Coagulation Profile: Recent Labs  Lab 04/26/21 1609  INR 1.4*   Cardiac Enzymes: No results for input(s): CKTOTAL, CKMB, CKMBINDEX, TROPONINI in the last 168 hours. BNP (last 3 results) No results for input(s): PROBNP in the last 8760 hours. HbA1C: No results for input(s): HGBA1C in the last 72 hours. CBG: No results for input(s): GLUCAP in the last 168 hours. Lipid Profile: No results for input(s): CHOL, HDL, LDLCALC, TRIG, CHOLHDL, LDLDIRECT in the last 72 hours. Thyroid Function Tests: No results for input(s): TSH, T4TOTAL, FREET4, T3FREE, THYROIDAB in the last 72 hours. Anemia Panel: No  results for input(s): VITAMINB12, FOLATE, FERRITIN, TIBC, IRON, RETICCTPCT in the last 72 hours. Urine analysis:    Component Value Date/Time   COLORURINE YELLOW 11/07/2018 2316   APPEARANCEUR CLEAR 11/07/2018 2316   LABSPEC 1.010 11/07/2018 2316   PHURINE 7.0 11/07/2018 2316   GLUCOSEU NEGATIVE 11/07/2018 2316   HGBUR NEGATIVE 11/07/2018 2316   BILIRUBINUR NEGATIVE 11/07/2018 2316   KETONESUR NEGATIVE 11/07/2018 2316   PROTEINUR NEGATIVE 11/07/2018 2316   NITRITE NEGATIVE 11/07/2018 2316   LEUKOCYTESUR NEGATIVE 11/07/2018 2316   Sepsis Labs: @LABRCNTIP (procalcitonin:4,lacticidven:4) ) Recent Results (from the past 240 hour(s))  Resp Panel by RT-PCR (Flu A&B, Covid) Nasopharyngeal Swab     Status: None   Collection Time: 04/26/21  9:05 PM   Specimen: Nasopharyngeal Swab; Nasopharyngeal(NP) swabs in vial transport medium  Result Value Ref Range Status   SARS Coronavirus 2 by RT PCR NEGATIVE NEGATIVE Final    Comment: (NOTE) SARS-CoV-2 target nucleic acids are NOT DETECTED.  The SARS-CoV-2 RNA is generally detectable in upper respiratory specimens during the acute phase of infection. The lowest concentration of  SARS-CoV-2 viral copies this assay can detect is 138 copies/mL. A negative result does not preclude SARS-Cov-2 infection and should not be used as the sole basis for treatment or other patient management decisions. A negative result may occur with  improper specimen collection/handling, submission of specimen other than nasopharyngeal swab, presence of viral mutation(s) within the areas targeted by this assay, and inadequate number of viral copies(<138 copies/mL). A negative result must be combined with clinical observations, patient history, and epidemiological information. The expected result is Negative.  Fact Sheet for Patients:  EntrepreneurPulse.com.au  Fact Sheet for Healthcare Providers:  IncredibleEmployment.be  This test is no t yet approved or cleared by the [place] FDA and  has been authorized for detection and/or diagnosis of SARS-CoV-2 by FDA under an Emergency Use Authorization (EUA). This EUA will remain  in effect (meaning this test can be used) for the duration of the COVID-19 declaration under Section 564(b)(1) of the Act, 21 U.S.C.section 360bbb-3(b)(1), unless the authorization is terminated  or revoked sooner.       Influenza A by PCR NEGATIVE NEGATIVE Final   Influenza B by PCR NEGATIVE NEGATIVE Final    Comment: (NOTE) The Xpert Xpress SARS-CoV-2/FLU/RSV plus assay is intended as an aid in the diagnosis of influenza from Nasopharyngeal swab specimens and should not be used as a sole basis for treatment. Nasal washings and aspirates are unacceptable for Xpert Xpress SARS-CoV-2/FLU/RSV testing.  Fact Sheet for Patients: EntrepreneurPulse.com.au  Fact Sheet for Healthcare Providers: IncredibleEmployment.be  This test is not yet approved or cleared by the [place] FDA and has been authorized for detection and/or diagnosis of SARS-CoV-2 by FDA under an Emergency Use  Authorization (EUA). This EUA will remain in effect (meaning this test can be used) for the duration of the COVID-19 declaration under Section 564(b)(1) of the Act, 21 U.S.C. section 360bbb-3(b)(1), unless the authorization is terminated or revoked.  Performed at [facility], [place] [address] [place], [place] [address]      Radiological Exams on Admission: CT Head Wo Contrast  Result Date: 04/26/2021 CLINICAL DATA:  Head trauma, minor (Age >= 65y).  Syncope. EXAM: CT HEAD WITHOUT CONTRAST TECHNIQUE: Contiguous axial images were obtained from the base of the skull through the vertex without intravenous contrast. RADIATION DOSE REDUCTION: This exam was performed according to the departmental dose-optimization program which includes automated exposure control, adjustment of the mA  and/or kV according to patient size and/or use of iterative reconstruction technique. COMPARISON:  11/08/2018 FINDINGS: Brain: There is atrophy and chronic small vessel disease changes. No acute intracranial abnormality. Specifically, no hemorrhage, hydrocephalus, mass lesion, acute infarction, or significant intracranial injury. Vascular: No hyperdense vessel or unexpected calcification. Skull: No acute calvarial abnormality. Sinuses/Orbits: No acute findings Other: None IMPRESSION: Atrophy, chronic microvascular disease. No acute intracranial abnormality. Electronically Signed   By: Rolm Baptise M.D.   On: 04/26/2021 19:55   CT Hip Right Wo Contrast  Result Date: 04/26/2021 CLINICAL DATA:  Fall, right hip pain, osteopenia. EXAM: CT OF THE RIGHT HIP WITHOUT CONTRAST TECHNIQUE: Multidetector CT imaging of the right hip was performed according to the standard protocol. Multiplanar CT image reconstructions were also generated. RADIATION DOSE REDUCTION: This exam was performed according to the departmental dose-optimization program which includes automated exposure control, adjustment of the mA and/or kV  according to patient size and/or use of iterative reconstruction technique. COMPARISON:  None. FINDINGS: Bones/Joint/Cartilage The osseous structures are diffusely osteopenic. No acute fracture or dislocation identified. Remote healed fractures of the right inferior pubic ramus and remote nonunited fracture of the right pubic symphysis are identified. A linear lucency is seen within the anterior column of the right hip best appreciated on axial image # 16/4 and along the superior margin of the superior pubic ramus best seen on coronal image # 11/8 which appear corticated and may reflect minimally displaced remote fractures. 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 Left total hip arthroplasty has been performed. Ligaments Suboptimally assessed by CT. Muscles and Tendons Moderate muscular atrophy. Iliopsoas and gluteal tendons appear intact. Degenerative enthesopathy noted at the origin of the hamstring tendon. Soft tissues A lenticular hyperdense collection is seen within the subcutaneous soft tissues of the right hip lateral to the greater trochanter compatible with a large subcutaneous hematoma better appreciated on concurrently performed CT examination of the abdomen and pelvis. There is extensive surrounding interstitial hemorrhage within the visualized right lower extremity. No intramuscular hemorrhage or hematoma identified. IMPRESSION: Marked osteopenia. No acute fracture or dislocation identified. Linear lucency within the anterior column of the right hip likely reflects a remote, healed fracture, however, if there is strong clinical suspicion of acute, nondisplaced fracture, MRI examination would be more sensitive given the degree of osteopenia. Remote fractures of the right pubic symphysis and inferior pubic ramus as described above. Lentiform hematoma within the subcutaneous soft tissues of the right hip with extensive surrounding interstitial hemorrhage within the subcutaneous fat of the  visualized right lower extremity. Electronically Signed   By: Fidela Salisbury M.D.   On: 04/26/2021 20:37   CT L-SPINE NO CHARGE  Result Date: 04/26/2021 CLINICAL DATA:  Right hip pain, fall, stress fracture suspected EXAM: CT LUMBAR SPINE WITHOUT CONTRAST TECHNIQUE: Multidetector CT imaging of the lumbar spine was performed without intravenous contrast administration. Multiplanar CT image reconstructions were also generated. RADIATION DOSE REDUCTION: This exam was performed according to the departmental dose-optimization program which includes automated exposure control, adjustment of the mA and/or kV according to patient size and/or use of iterative reconstruction technique. COMPARISON:  None. FINDINGS: Segmentation: 5 lumbar type vertebrae. Alignment: 2 mm anterolisthesis of L3-4 and L4-5 are likely degenerative in nature. Vertebrae: The osseous structures are diffusely osteopenic. There are insufficiency fractures of T12l (20%), L1 (40-50%), L2 (75%), L4 (30-40%), and L5 (minimal). There is minimal retropulsion of the posterosuperior osteophytic spurs of L1, L2, and L4 with resultant mild central canal stenosis of  the a lumbar spine posterior to L2 within AP diameter of 8 mm. No acute fracture. Remote fractures of the sacrum are incidentally noted. Paraspinal and other soft tissues: Paraspinal soft tissues are better visualized on concurrently performed noncontrast CT examination of the abdomen and pelvis which were described to greater extent in that report. High-grade central canal stenosis is noted at L3-4 secondary to facet arthrosis, hypertrophy ligament flavum, and broad-based disc bulge. Similar findings are noted at L4-5. No definite canal hematoma. Disc levels: Advanced degenerative disc disease is seen throughout the lumbar spine with disc space narrowing, endplate remodeling, and vacuum disc phenomena at L4-5. IMPRESSION: No acute fracture or listhesis of the lumbar spine. Numerous insufficiency  fractures, as described above, with minimal retropulsion of L1, L2, and L4 resulting in mild central canal stenosis at L2. Multifactorial high-grade central canal stenosis at L3-4 and L4-5 Remote fractures of the sacrum. Electronically Signed   By: Fidela Salisbury M.D.   On: 04/26/2021 20:31   CT Renal Stone Study  Result Date: 04/26/2021 CLINICAL DATA:  Fall, flank pain, nephrolithiasis EXAM: CT ABDOMEN AND PELVIS WITHOUT CONTRAST TECHNIQUE: Multidetector CT imaging of the abdomen and pelvis was performed following the standard protocol without IV contrast. RADIATION DOSE REDUCTION: This exam was performed according to the departmental dose-optimization program which includes automated exposure control, adjustment of the mA and/or kV according to patient size and/or use of iterative reconstruction technique. COMPARISON:  None. FINDINGS: Lower chest: Small congenital left diaphragmatic hernia. Mild right basilar atelectasis or scar. Multiple radiodensities are seen within the a distal esophagus which appear intramural in nature and may represent ingested tablets within epiphrenic diverticulum or within a ulcer cavity. These were not seen on prior CT examination of 11/08/2018. Hepatobiliary: 4.3 cm simple cyst within the inferior right hepatic lobe. Liver otherwise unremarkable. Cholelithiasis without pericholecystic inflammatory change. No intra or extrahepatic biliary ductal dilation. Pancreas: Unremarkable Spleen: Unremarkable Adrenals/Urinary Tract: Adrenal glands are unremarkable. Kidneys are normal, without renal calculi, focal lesion, or hydronephrosis. Bladder is distended but is otherwise unremarkable. Stomach/Bowel: Moderate stool throughout the colon without evidence of obstruction. Moderate sigmoid diverticulosis without superimposed acute inflammatory change. Stomach, small bowel, and large bowel are otherwise unremarkable. Appendix normal. No free intraperitoneal gas or fluid. Vascular/Lymphatic:  Moderate aortoiliac atherosclerotic calcification. No aortic aneurysm. No pathologic adenopathy within the abdomen and pelvis. Reproductive: A 32 mm simple appearing cyst is seen within the right ovary. The pelvic organs are otherwise unremarkable. Other: A large elliptical hyperdense collection is seen within the subcutaneous soft tissues lateral to the greater trochanter of the right hip compatible with a subcutaneous hematoma measuring at least 6.4 x 9.6 x 16.3 cm in greatest dimension. There is interstitial hemorrhage surrounding the dominant lenticular collection within the subcutaneous fat of the right upper extremity and right flank. No active extravasation identified. Musculoskeletal: Remote fractures of the right pubic symphysis and inferior pubic ramus. There are insufficiency fractures of T12, L1, L2, L4, and L5, most severel at L2 with 60-70% loss of height. There is minimal retropulsion of osteophytic spurring with resultant mild central canal stenosis at L2. Milder, similar changes are noted at T12, L1, and L4. These fractures appear remote in nature. Superimposed advanced degenerative changes seen throughout the lumbar spine. Left total hip arthroplasty has been performed. No acute bone abnormality. IMPRESSION: Minimum 16.3 cm lentiform hematoma within the subcutaneous soft tissues lateral to the right hip. No active extravasation. Extensive surrounding interstitial hemorrhage or edema within the subcutaneous fat of  the right flank and visualized right lower extremity. No acute fracture or dislocation. Numerous insufficiency fractures throughout the visualized thoracolumbar spine with minimal retropulsion of osteophytic spurs resulting in mild central canal stenosis at L2. Rounded hyperdense foreign bodies within the distal esophagus which may reflect ingested tablets within an epiphrenic diverticulum or within a ulcer crater given its intramural appearance. This could be better assessed with  fluoroscopic esophagram or endoscopy once the patient's acute issues have resolved. Cholelithiasis. Moderate stool throughout the colon without evidence of obstruction. Moderate distal colonic diverticulosis without superimposed acute inflammatory change. 32 mm right adnexal simple cyst Recommend follow-up US in 6-12 months. Note: This recommendation does not apply to premenarchal patients and to those with increased risk (genetic, family history, elevated tumor markers or other high-risk factors) of ovarian cancer. Reference: JACR 2020 Feb; 17(2):248-254 Aortic Atherosclerosis (ICD10-I70.0). Electronically Signed   By: Fidela Salisbury M.D.   On: 04/26/2021 20:22   DG Hip Unilat W or Wo Pelvis 2-3 Views Right  Result Date: 04/26/2021 CLINICAL DATA:  Right hip pain following a fall from a syncopal episode today. EXAM: DG HIP (WITH OR WITHOUT PELVIS) 2-3V RIGHT COMPARISON:  None. FINDINGS: There is insufficient internal rotation of the leg on the frontal views of the hip with no gross fracture or dislocation seen. There is also incomplete visualization of the right femoral head and neck on the lateral views due to overlapping of a left hip prosthesis. There is some bony irregularity at the medial aspect of the right inferior pubic ramus without visible acute fracture lines. IMPRESSION: 1. Incomplete visualization of the right hip due to limitations described above. There is a continued clinical concern for a possible fracture, a right hip CT without contrast would be recommended. 2. Probable old, healed fracture of the medial aspect of the right inferior pubic ramus at the junction with the pubic body. An acute fracture is less likely but not excluded. This could also be evaluated at the time of CT. Electronically Signed   By: Claudie Revering M.D.   On: 04/26/2021 14:08    EKG: Independently reviewed.  Normal sinus rhythm with QTC of 470 ms.  Assessment/Plan Principal Problem:   Thigh hematoma Active  Problems:   Vascular dementia without behavioral disturbance (HCC)   Acute blood loss anemia   Rheumatoid arthritis (HCC)    Thigh hematoma in the setting of Eliquis after mechanical fall for which I discussed with orthopedic surgeon Dr. Ninfa Linden who advised at this time only observation.  Holding off Eliquis.  Did discuss with patient's family about the holding Eliquis and also recent PE.  Patient family agreeable with holding Eliquis.  We will check serial CBCs.  Transfuse if hemoglobin less than 7.  No signs of any compartment syndrome. Syncope likely from blood loss and hypotension.  Closely monitor.  Transfuse if hemoglobin less than 7. Acute blood loss anemia from right thigh hematoma and also has been guaiac positive.  Transfuse for hemoglobin less than 7. Recently diagnosed pulmonary embolism presently holding Eliquis due to large hematoma left thigh and possible GI bleed. History of rheumatoid arthritis/Sjogren's syndrome used to be on methotrexate which was discontinued last month due to patient having PE and was attributing to it. Advanced dementia.   Since patient has large hematoma in the right thigh area in the setting of Eliquis will need close monitoring for any further worsening inpatient status.   DVT prophylaxis: SCDs.  Avoiding anticoagulation in the setting of bleeding. Code Status: Full  code. Family Communication: Patient's son and daughter. Disposition Plan: Home when stable. Consults called: Orthopedics. Admission status: Inpatient.   Rise Patience MD [facility] Pager [phone].  If 7PM-7AM, please contact night-coverage [email or web address] Password [facility]  04/26/2021, 9:53 PM

## 2021-04-26 NOTE — ED Provider Notes (Signed)
[facility] DEPT Provider Note   CSN: [id number] Arrival date & time: 04/26/21  1208     History  No chief complaint on file.   Connie Snyder is a 88 y.o. female.  88 yo F with a chief complaints of a near syncopal event.  Per EMS the patient was walking and got faint and her family gently lowered them to the ground.  Reportedly had a fall yesterday as well.  Was complaining of some right hip pain.  Described as burning to the right thigh.  On asking the patient she denies any areas of discomfort.  She tells me she does not remember exactly what happened today.  Tells me she does remember going down.  Denies any difficulty breathing denies chest pain denies abdominal pain.  Feels like she has been eating and drinking okay.  The history is provided by the patient.  Illness Severity:  Moderate Onset quality:  Gradual Duration:  2 minutes Timing:  Rare Progression:  Resolved Chronicity:  New Associated symptoms: myalgias   Associated symptoms: no chest pain, no congestion, no fever, no headaches, no nausea, no rhinorrhea, no shortness of breath, no vomiting and no wheezing       Home Medications Prior to Admission medications   Medication Sig Start Date End Date Taking? Authorizing Provider  celecoxib (CELEBREX) 100 MG capsule Take 100 mg by mouth in the morning and at bedtime.   Yes [provider]  Cholecalciferol (VITAMIN D-3) 25 MCG (1000 UT) CAPS Take 1,000 Units by mouth daily.   Yes [provider]  docusate sodium (COLACE) 100 MG capsule Take 100 mg by mouth daily.   Yes [provider]  ELIQUIS 5 MG TABS tablet Take 5 mg by mouth in the morning and at bedtime.   Yes [provider]  feeding supplement (BOOST HIGH PROTEIN) LIQD Take 1 Container by mouth 2 (two) times daily between meals.   Yes [provider]  ferrous sulfate 325 (65 FE) MG tablet Take 325 mg by mouth daily with breakfast.   Yes  [provider]  Folic Acid-Vit [id number] [id number] (FOLBEE) 2.5-25-1 MG TABS tablet Take 1 tablet by mouth daily.   Yes [provider]  hydrOXYzine (ATARAX) 25 MG tablet Take 25 mg by mouth 2 (two) times daily. 04/17/21  Yes [provider]  mirtazapine (REMERON) 30 MG tablet Take 30 mg by mouth at bedtime.  11/01/17  Yes [provider]  Multiple Vitamins-Minerals (MULTIVITAMIN WOMEN 50+) TABS Take 1 tablet by mouth daily with breakfast.   Yes [provider]  baclofen (LIORESAL) 10 MG tablet Take 0.5 tablets (5 mg total) by mouth 3 (three) times daily as needed for muscle spasms. Patient not taking: Reported on 04/26/2021 06/26/18   Leandrew Koyanagi, MD  HYDROcodone-acetaminophen (NORCO) 5-325 MG tablet Take 1 tablet by mouth every 6 (six) hours as needed for moderate pain. Patient not taking: Reported on 04/26/2021 08/15/19   Pete Pelt, PA-C  methotrexate (RHEUMATREX) 2.5 MG tablet Take 10 mg by mouth every Sunday.  Patient not taking: Reported on 04/26/2021 11/13/17   [provider]  ondansetron (ZOFRAN) 4 MG tablet Take 1 tablet (4 mg total) by mouth every 6 (six) hours as needed for nausea. Patient not taking: Reported on 04/26/2021 12/28/17   Elgergawy, Silver Huguenin, MD  potassium chloride SA (K-DUR) 20 MEQ tablet Take 2 tablets (40 mEq total) by mouth daily for 3 days. Patient not taking: Reported  on 04/26/2021 11/09/18 04/26/21  Cherylann Ratel A, DO  senna-docusate (SENOKOT-S) 8.6-50 MG tablet Take 1 tablet by mouth at bedtime as needed for mild constipation. Patient not taking: Reported on 04/26/2021 12/28/17   Elgergawy, Silver Huguenin, MD      Allergies    Aricept [donepezil]    Review of Systems   Review of Systems  Constitutional:  Negative for chills and fever.  HENT:  Negative for congestion and rhinorrhea.   Eyes:  Negative for redness and visual disturbance.  Respiratory:  Negative for shortness of breath and wheezing.   Cardiovascular:  Negative  for chest pain and palpitations.  Gastrointestinal:  Negative for nausea and vomiting.  Genitourinary:  Negative for dysuria and urgency.  Musculoskeletal:  Positive for arthralgias and myalgias.  Skin:  Negative for pallor and wound.  Neurological:  Positive for syncope. Negative for dizziness and headaches.   Physical Exam Updated Vital Signs BP 127/87 (BP Location: Right Arm)    Pulse 85    Temp 98.3 F (36.8 C) (Oral)    Resp 18    Ht 5\' 4"  (1.626 m)    Wt 54.4 kg    SpO2 94%    BMI 20.59 kg/m  Physical Exam Vitals and nursing note reviewed.  Constitutional:      General: She is not in acute distress.    Appearance: She is well-developed. She is not diaphoretic.  HENT:     Head: Normocephalic and atraumatic.  Eyes:     Pupils: Pupils are equal, round, and reactive to light.  Cardiovascular:     Rate and Rhythm: Normal rate and regular rhythm.     Heart sounds: No murmur heard.   No friction rub. No gallop.  Pulmonary:     Effort: Pulmonary effort is normal.     Breath sounds: No wheezing or rales.  Abdominal:     General: There is no distension.     Palpations: Abdomen is soft.     Tenderness: There is no abdominal tenderness.  Musculoskeletal:        General: No tenderness.     Cervical back: Normal range of motion and neck supple.     Comments: Able to internally externally rotate the right lower extremity without significant discomfort.  Mild pain with compression of the pelvis on the right.  No midline spinal tenderness step-offs or deformities.  Skin:    General: Skin is warm and dry.  Neurological:     Mental Status: She is alert and oriented to person, place, and time.  Psychiatric:        Behavior: Behavior normal.    ED Results / Procedures / Treatments   Labs (all labs ordered are listed, but only abnormal results are displayed) Labs Reviewed  CBC WITH DIFFERENTIAL/PLATELET - Abnormal; Notable for the following components:      Result Value   RBC 2.64  (*)    Hemoglobin 7.6 (*)    HCT 22.0 (*)    RDW 16.0 (*)    All other components within normal limits  COMPREHENSIVE METABOLIC PANEL - Abnormal; Notable for the following components:   Glucose, Bld 138 (*)    BUN 26 (*)    Calcium 8.6 (*)    Total Protein 6.1 (*)    Albumin 3.1 (*)    All other components within normal limits  PROTIME-INR - Abnormal; Notable for the following components:   Prothrombin Time 17.1 (*)    INR 1.4 (*)  All other components within normal limits  CBC - Abnormal; Notable for the following components:   WBC 10.8 (*)    RBC 2.26 (*)    Hemoglobin 6.6 (*)    HCT 19.2 (*)    RDW 16.1 (*)    All other components within normal limits  CBC - Abnormal; Notable for the following components:   RBC 2.24 (*)    Hemoglobin 6.5 (*)    HCT 18.8 (*)    RDW 16.0 (*)    All other components within normal limits  CBC - Abnormal; Notable for the following components:   RBC 2.61 (*)    Hemoglobin 7.8 (*)    HCT 22.0 (*)    All other components within normal limits  GLUCOSE, CAPILLARY - Abnormal; Notable for the following components:   Glucose-Capillary 124 (*)    All other components within normal limits  BASIC METABOLIC PANEL - Abnormal; Notable for the following components:   CO2 21 (*)    BUN 29 (*)    Calcium 8.1 (*)    All other components within normal limits  POC OCCULT BLOOD, ED - Abnormal; Notable for the following components:   Fecal Occult Bld POSITIVE (*)    All other components within normal limits  RESP PANEL BY RT-PCR (FLU A&B, COVID) ARPGX2  GLUCOSE, CAPILLARY  TYPE AND SCREEN  PREPARE RBC (CROSSMATCH)    EKG EKG Interpretation  Date/Time:  Monday April 26 2021 12:26:47 EST Ventricular Rate:  79 PR Interval:  58 QRS Duration: 99 QT Interval:  410 QTC Calculation: 470 R Axis:   -17 Text Interpretation: Sinus rhythm Short PR interval LVH with secondary repolarization abnormality qrs narrowing Otherwise no significant change  Confirmed by Deno Etienne [phone]) on 04/26/2021 12:42:25 PM  Radiology CT Head Wo Contrast  Result Date: 04/26/2021 CLINICAL DATA:  Head trauma, minor (Age >= 65y).  Syncope. EXAM: CT HEAD WITHOUT CONTRAST TECHNIQUE: Contiguous axial images were obtained from the base of the skull through the vertex without intravenous contrast. RADIATION DOSE REDUCTION: This exam was performed according to the departmental dose-optimization program which includes automated exposure control, adjustment of the mA and/or kV according to patient size and/or use of iterative reconstruction technique. COMPARISON:  11/08/2018 FINDINGS: Brain: There is atrophy and chronic small vessel disease changes. No acute intracranial abnormality. Specifically, no hemorrhage, hydrocephalus, mass lesion, acute infarction, or significant intracranial injury. Vascular: No hyperdense vessel or unexpected calcification. Skull: No acute calvarial abnormality. Sinuses/Orbits: No acute findings Other: None IMPRESSION: Atrophy, chronic microvascular disease. No acute intracranial abnormality. Electronically Signed   By: Rolm Baptise M.D.   On: 04/26/2021 19:55   CT Hip Right Wo Contrast  Result Date: 04/26/2021 CLINICAL DATA:  Fall, right hip pain, osteopenia. EXAM: CT OF THE RIGHT HIP WITHOUT CONTRAST TECHNIQUE: Multidetector CT imaging of the right hip was performed according to the standard protocol. Multiplanar CT image reconstructions were also generated. RADIATION DOSE REDUCTION: This exam was performed according to the departmental dose-optimization program which includes automated exposure control, adjustment of the mA and/or kV according to patient size and/or use of iterative reconstruction technique. COMPARISON:  None. FINDINGS: Bones/Joint/Cartilage The osseous structures are diffusely osteopenic. No acute fracture or dislocation identified. Remote healed fractures of the right inferior pubic ramus and remote nonunited fracture of the right  pubic symphysis are identified. A linear lucency is seen within the anterior column of the right hip best appreciated on axial image # 16/4 and along the superior margin of  the superior pubic ramus best seen on coronal image # 11/8 which appear corticated and may reflect minimally displaced remote fractures. 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 Left total hip arthroplasty has been performed. Ligaments Suboptimally assessed by CT. Muscles and Tendons Moderate muscular atrophy. Iliopsoas and gluteal tendons appear intact. Degenerative enthesopathy noted at the origin of the hamstring tendon. Soft tissues A lenticular hyperdense collection is seen within the subcutaneous soft tissues of the right hip lateral to the greater trochanter compatible with a large subcutaneous hematoma better appreciated on concurrently performed CT examination of the abdomen and pelvis. There is extensive surrounding interstitial hemorrhage within the visualized right lower extremity. No intramuscular hemorrhage or hematoma identified. IMPRESSION: Marked osteopenia. No acute fracture or dislocation identified. Linear lucency within the anterior column of the right hip likely reflects a remote, healed fracture, however, if there is strong clinical suspicion of acute, nondisplaced fracture, MRI examination would be more sensitive given the degree of osteopenia. Remote fractures of the right pubic symphysis and inferior pubic ramus as described above. Lentiform hematoma within the subcutaneous soft tissues of the right hip with extensive surrounding interstitial hemorrhage within the subcutaneous fat of the visualized right lower extremity. Electronically Signed   By: Fidela Salisbury M.D.   On: 04/26/2021 20:37   CT L-SPINE NO CHARGE  Result Date: 04/26/2021 CLINICAL DATA:  Right hip pain, fall, stress fracture suspected EXAM: CT LUMBAR SPINE WITHOUT CONTRAST TECHNIQUE: Multidetector CT imaging of the lumbar spine was  performed without intravenous contrast administration. Multiplanar CT image reconstructions were also generated. RADIATION DOSE REDUCTION: This exam was performed according to the departmental dose-optimization program which includes automated exposure control, adjustment of the mA and/or kV according to patient size and/or use of iterative reconstruction technique. COMPARISON:  None. FINDINGS: Segmentation: 5 lumbar type vertebrae. Alignment: 2 mm anterolisthesis of L3-4 and L4-5 are likely degenerative in nature. Vertebrae: The osseous structures are diffusely osteopenic. There are insufficiency fractures of T12l (20%), L1 (40-50%), L2 (75%), L4 (30-40%), and L5 (minimal). There is minimal retropulsion of the posterosuperior osteophytic spurs of L1, L2, and L4 with resultant mild central canal stenosis of the a lumbar spine posterior to L2 within AP diameter of 8 mm. No acute fracture. Remote fractures of the sacrum are incidentally noted. Paraspinal and other soft tissues: Paraspinal soft tissues are better visualized on concurrently performed noncontrast CT examination of the abdomen and pelvis which were described to greater extent in that report. High-grade central canal stenosis is noted at L3-4 secondary to facet arthrosis, hypertrophy ligament flavum, and broad-based disc bulge. Similar findings are noted at L4-5. No definite canal hematoma. Disc levels: Advanced degenerative disc disease is seen throughout the lumbar spine with disc space narrowing, endplate remodeling, and vacuum disc phenomena at L4-5. IMPRESSION: No acute fracture or listhesis of the lumbar spine. Numerous insufficiency fractures, as described above, with minimal retropulsion of L1, L2, and L4 resulting in mild central canal stenosis at L2. Multifactorial high-grade central canal stenosis at L3-4 and L4-5 Remote fractures of the sacrum. Electronically Signed   By: Fidela Salisbury M.D.   On: 04/26/2021 20:31   CT Renal Stone  Study  Result Date: 04/26/2021 CLINICAL DATA:  Fall, flank pain, nephrolithiasis EXAM: CT ABDOMEN AND PELVIS WITHOUT CONTRAST TECHNIQUE: Multidetector CT imaging of the abdomen and pelvis was performed following the standard protocol without IV contrast. RADIATION DOSE REDUCTION: This exam was performed according to the departmental dose-optimization program which includes automated exposure control, adjustment  of the mA and/or kV according to patient size and/or use of iterative reconstruction technique. COMPARISON:  None. FINDINGS: Lower chest: Small congenital left diaphragmatic hernia. Mild right basilar atelectasis or scar. Multiple radiodensities are seen within the a distal esophagus which appear intramural in nature and may represent ingested tablets within epiphrenic diverticulum or within a ulcer cavity. These were not seen on prior CT examination of 11/08/2018. Hepatobiliary: 4.3 cm simple cyst within the inferior right hepatic lobe. Liver otherwise unremarkable. Cholelithiasis without pericholecystic inflammatory change. No intra or extrahepatic biliary ductal dilation. Pancreas: Unremarkable Spleen: Unremarkable Adrenals/Urinary Tract: Adrenal glands are unremarkable. Kidneys are normal, without renal calculi, focal lesion, or hydronephrosis. Bladder is distended but is otherwise unremarkable. Stomach/Bowel: Moderate stool throughout the colon without evidence of obstruction. Moderate sigmoid diverticulosis without superimposed acute inflammatory change. Stomach, small bowel, and large bowel are otherwise unremarkable. Appendix normal. No free intraperitoneal gas or fluid. Vascular/Lymphatic: Moderate aortoiliac atherosclerotic calcification. No aortic aneurysm. No pathologic adenopathy within the abdomen and pelvis. Reproductive: A 32 mm simple appearing cyst is seen within the right ovary. The pelvic organs are otherwise unremarkable. Other: A large elliptical hyperdense collection is seen within  the subcutaneous soft tissues lateral to the greater trochanter of the right hip compatible with a subcutaneous hematoma measuring at least 6.4 x 9.6 x 16.3 cm in greatest dimension. There is interstitial hemorrhage surrounding the dominant lenticular collection within the subcutaneous fat of the right upper extremity and right flank. No active extravasation identified. Musculoskeletal: Remote fractures of the right pubic symphysis and inferior pubic ramus. There are insufficiency fractures of T12, L1, L2, L4, and L5, most severel at L2 with 60-70% loss of height. There is minimal retropulsion of osteophytic spurring with resultant mild central canal stenosis at L2. Milder, similar changes are noted at T12, L1, and L4. These fractures appear remote in nature. Superimposed advanced degenerative changes seen throughout the lumbar spine. Left total hip arthroplasty has been performed. No acute bone abnormality. IMPRESSION: Minimum 16.3 cm lentiform hematoma within the subcutaneous soft tissues lateral to the right hip. No active extravasation. Extensive surrounding interstitial hemorrhage or edema within the subcutaneous fat of the right flank and visualized right lower extremity. No acute fracture or dislocation. Numerous insufficiency fractures throughout the visualized thoracolumbar spine with minimal retropulsion of osteophytic spurs resulting in mild central canal stenosis at L2. Rounded hyperdense foreign bodies within the distal esophagus which may reflect ingested tablets within an epiphrenic diverticulum or within a ulcer crater given its intramural appearance. This could be better assessed with fluoroscopic esophagram or endoscopy once the patient's acute issues have resolved. Cholelithiasis. Moderate stool throughout the colon without evidence of obstruction. Moderate distal colonic diverticulosis without superimposed acute inflammatory change. 32 mm right adnexal simple cyst Recommend follow-up US in 6-12  months. Note: This recommendation does not apply to premenarchal patients and to those with increased risk (genetic, family history, elevated tumor markers or other high-risk factors) of ovarian cancer. Reference: JACR 2020 Feb; 17(2):248-254 Aortic Atherosclerosis (ICD10-I70.0). Electronically Signed   By: Fidela Salisbury M.D.   On: 04/26/2021 20:22   DG Hip Unilat W or Wo Pelvis 2-3 Views Right  Result Date: 04/26/2021 CLINICAL DATA:  Right hip pain following a fall from a syncopal episode today. EXAM: DG HIP (WITH OR WITHOUT PELVIS) 2-3V RIGHT COMPARISON:  None. FINDINGS: There is insufficient internal rotation of the leg on the frontal views of the hip with no gross fracture or dislocation seen. There is also incomplete visualization  of the right femoral head and neck on the lateral views due to overlapping of a left hip prosthesis. There is some bony irregularity at the medial aspect of the right inferior pubic ramus without visible acute fracture lines. IMPRESSION: 1. Incomplete visualization of the right hip due to limitations described above. There is a continued clinical concern for a possible fracture, a right hip CT without contrast would be recommended. 2. Probable old, healed fracture of the medial aspect of the right inferior pubic ramus at the junction with the pubic body. An acute fracture is less likely but not excluded. This could also be evaluated at the time of CT. Electronically Signed   By: Claudie Revering M.D.   On: 04/26/2021 14:08    Procedures Procedures    Medications Ordered in ED Medications  acetaminophen (TYLENOL) tablet 650 mg (has no administration in time range)    Or  acetaminophen (TYLENOL) suppository 650 mg (has no administration in time range)  morphine 2 MG/ML injection 0.5 mg (0.5 mg Intravenous Given 04/26/21 2228)  acetaminophen (TYLENOL) tablet 1,000 mg (1,000 mg Oral Given 04/26/21 1248)  sodium chloride 0.9 % bolus 1,000 mL (0 mLs Intravenous Stopped 04/26/21  1721)  morphine 2 MG/ML injection 2 mg (2 mg Intravenous Given 04/26/21 1505)  pantoprazole (PROTONIX) injection 40 mg (40 mg Intravenous Given 04/26/21 1725)  0.9 %  sodium chloride infusion (Manually program via Guardrails IV Fluids) ( Intravenous New Bag/Given 04/27/21 0138)    ED Course/ Medical Decision Making/ A&P                           Medical Decision Making Amount and/or Complexity of Data Reviewed Labs: ordered. Radiology: ordered. ECG/medicine tests: ordered.  Risk OTC drugs. Prescription drug management. Decision regarding hospitalization.   88 yo F with a chief complaints of a fall.  Per EMS it sounds like she had a near syncopal event.  This occurred while she was ambulating.  She has trouble providing much history from that standpoint.  Apparently had a fall yesterday has been complaining of right hip pain though does not complain of any pain now although does have some pain on compression of the right hemipelvis.  We will obtain a plain film.  Blood work bolus of IV fluids reassess.  Plain film of the hip viewed by me without obvious fx.  Rads recommends CT.    Hbg drop of about 3 g.  Dark and tarry stool on exam.  Given protonix.   Signed out to Dr. Melina Copa, please see their note for further details of care.   CRITICAL CARE Performed by: Cecilio Asper   Total critical care time: 35 minutes  Critical care time was exclusive of separately billable procedures and treating other patients.  Critical care was necessary to treat or prevent imminent or life-threatening deterioration.  Critical care was time spent personally by me on the following activities: development of treatment plan with patient and/or surrogate as well as nursing, discussions with consultants, evaluation of patient's response to treatment, examination of patient, obtaining history from patient or surrogate, ordering and performing treatments and interventions, ordering and review of  laboratory studies, ordering and review of radiographic studies, pulse oximetry and re-evaluation of patient's condition.  The patients results and plan were reviewed and discussed.   Any x-rays performed were independently reviewed by myself.   Differential diagnosis were considered with the presenting HPI.  Medications  acetaminophen (TYLENOL) tablet  650 mg (has no administration in time range)    Or  acetaminophen (TYLENOL) suppository 650 mg (has no administration in time range)  morphine 2 MG/ML injection 0.5 mg (0.5 mg Intravenous Given 04/26/21 2228)  acetaminophen (TYLENOL) tablet 1,000 mg (1,000 mg Oral Given 04/26/21 1248)  sodium chloride 0.9 % bolus 1,000 mL (0 mLs Intravenous Stopped 04/26/21 1721)  morphine 2 MG/ML injection 2 mg (2 mg Intravenous Given 04/26/21 1505)  pantoprazole (PROTONIX) injection 40 mg (40 mg Intravenous Given 04/26/21 1725)  0.9 %  sodium chloride infusion (Manually program via Guardrails IV Fluids) ( Intravenous New Bag/Given 04/27/21 0138)    Vitals:   04/27/21 0132 04/27/21 0138 04/27/21 0153 04/27/21 0504  BP: 122/60 122/60 108/62 127/87  Pulse: 87 87 85 85  Resp: 16 16 16 18   Temp: 97.8 F (36.6 C) 97.8 F (36.6 C) 98.3 F (36.8 C) 98.3 F (36.8 C)  TempSrc: Oral Oral Oral Oral  SpO2: 100% 100% 100% 94%  Weight:      Height:        Final diagnoses:  Right hip pain  Near syncope  Upper GI bleed            Final Clinical Impression(s) / ED Diagnoses Final diagnoses:  Right hip pain  Near syncope  Upper GI bleed    Rx / DC Orders ED Discharge Orders     None         Deno Etienne, DO 04/27/21 [id number]

## 2021-04-26 NOTE — ED Provider Notes (Signed)
Signout from Dr. Adela LankFloyd.  Patient had a fall yesterday complaining of right hip pain.  Today had a syncopal events.  X-ray equivocal, getting CTs of head, renal, LS-spine.  Hemoglobin decreased, heme positive from rectal exam . will need admission Physical Exam  BP 114/61    Pulse 63    Temp (!) 97.4 F (36.3 C) (Oral)    Resp 15    Ht 5\' 4"  (1.626 m)    Wt 54.4 kg    SpO2 100%    BMI 20.59 kg/m   Physical Exam  Procedures  Procedures  ED Course / MDM    Medical Decision Making Amount and/or Complexity of Data Reviewed Labs: ordered. Radiology: ordered. ECG/medicine tests: ordered.  Risk OTC drugs. Prescription drug management. Decision regarding hospitalization.  Patient CT showing multiple findings.  Most of them sound fairly chronic.  New acute finding seems to be a large right hip hematoma.  No evidence of active extravasation.  Patient clinically is very swollen and tender there.  The rest of her compartments seem soft.  Discussed with Dr. Toniann FailKakrakandy [facility] hospitalist who will evaluate the patient for admission.  More pain medicine ordered.  Patient in agreement with plan for admission.        Terrilee FilesButler, Allyn Bartelson C, MD 04/27/21 [phone]

## 2021-04-26 NOTE — ED Notes (Signed)
Patient transported to X-ray 

## 2021-04-26 NOTE — ED Triage Notes (Signed)
Pt BIB EMS from home.Patient son states she had a syncopal episode.Son states she was out for 2 minutes, but came to when EMS arrived. She was lowered to the ground. No LOC. Patient is on blood thinners. Patient did have a fall on yesterday and c/o right hip pain.  Vital signs were:  114/64 80-HR 16-RR 162-CBG

## 2021-04-27 DIAGNOSIS — I2699 Other pulmonary embolism without acute cor pulmonale: Secondary | ICD-10-CM

## 2021-04-27 DIAGNOSIS — S7011XA Contusion of right thigh, initial encounter: Secondary | ICD-10-CM

## 2021-04-27 DIAGNOSIS — M069 Rheumatoid arthritis, unspecified: Secondary | ICD-10-CM

## 2021-04-27 DIAGNOSIS — F0150 Vascular dementia without behavioral disturbance: Secondary | ICD-10-CM

## 2021-04-27 LAB — GLUCOSE, CAPILLARY
Glucose-Capillary: 124 mg/dL — ABNORMAL HIGH (ref 70–99)
Glucose-Capillary: 92 mg/dL (ref 70–99)
Glucose-Capillary: 80 mg/dL (ref 70–99)

## 2021-04-27 LAB — IRON AND TIBC
Iron: 24 ug/dL — ABNORMAL LOW (ref 28–170)
Saturation Ratios: 8 % — ABNORMAL LOW (ref 10.4–31.8)
TIBC: 294 ug/dL (ref 250–450)
UIBC: 270 ug/dL

## 2021-04-27 LAB — CBC
HCT: 18.8 % — ABNORMAL LOW (ref 36.0–46.0)
HCT: 22 % — ABNORMAL LOW (ref 36.0–46.0)
Hemoglobin: 6.5 g/dL — CL (ref 12.0–15.0)
Hemoglobin: 7.8 g/dL — ABNORMAL LOW (ref 12.0–15.0)
MCH: 29 pg (ref 26.0–34.0)
MCH: 29.9 pg (ref 26.0–34.0)
MCHC: 34.6 g/dL (ref 30.0–36.0)
MCHC: 35.5 g/dL (ref 30.0–36.0)
MCV: 83.9 fL (ref 80.0–100.0)
MCV: 84.3 fL (ref 80.0–100.0)
Platelets: 187 10*3/uL (ref 150–400)
Platelets: 204 10*3/uL (ref 150–400)
RBC: 2.24 MIL/uL — ABNORMAL LOW (ref 3.87–5.11)
RBC: 2.61 MIL/uL — ABNORMAL LOW (ref 3.87–5.11)
RDW: 15.1 % (ref 11.5–15.5)
RDW: 16 % — ABNORMAL HIGH (ref 11.5–15.5)
WBC: 10.4 10*3/uL (ref 4.0–10.5)
WBC: 9.9 10*3/uL (ref 4.0–10.5)
nRBC: 0.2 % (ref 0.0–0.2)
nRBC: 0.2 % (ref 0.0–0.2)

## 2021-04-27 LAB — BASIC METABOLIC PANEL
Anion gap: 7 (ref 5–15)
BUN: 29 mg/dL — ABNORMAL HIGH (ref 8–23)
CO2: 21 mmol/L — ABNORMAL LOW (ref 22–32)
Calcium: 8.1 mg/dL — ABNORMAL LOW (ref 8.9–10.3)
Chloride: 107 mmol/L (ref 98–111)
Creatinine, Ser: 0.59 mg/dL (ref 0.44–1.00)
GFR, Estimated: >60 mL/min (ref >60)
Glucose, Bld: 98 mg/dL (ref 70–99)
Potassium: 4.4 mmol/L (ref 3.5–5.1)
Sodium: 135 mmol/L (ref 135–145)

## 2021-04-27 LAB — FERRITIN: Ferritin: 61 ng/mL (ref 11–307)

## 2021-04-27 LAB — PREPARE RBC (CROSSMATCH)

## 2021-04-27 MED ORDER — MIRTAZAPINE 15 MG PO TABS
30.0000 mg | ORAL_TABLET | Freq: Every day | ORAL | Status: DC
  Administered 2021-04-27 – 2021-04-28 (×2): 30 mg via ORAL
  Filled 2021-04-27 (×2): qty 2

## 2021-04-27 MED ORDER — OXYCODONE HCL 5 MG PO TABS: 2.5000 mg | ORAL_TABLET | Freq: Four times a day (QID) | ORAL | Status: DC | PRN

## 2021-04-27 MED ORDER — ENSURE ENLIVE PO LIQD
237.0000 mL | Freq: Two times a day (BID) | ORAL | Status: DC
  Administered 2021-04-27 – 2021-04-29 (×5): 237 mL via ORAL

## 2021-04-27 MED ORDER — SODIUM CHLORIDE 0.9% IV SOLUTION: Freq: Once | INTRAVENOUS | Status: AC

## 2021-04-27 MED ORDER — ACETAMINOPHEN 500 MG PO TABS
1000.0000 mg | ORAL_TABLET | Freq: Three times a day (TID) | ORAL | Status: DC
  Administered 2021-04-27 – 2021-04-29 (×6): 1000 mg via ORAL
  Filled 2021-04-27 (×6): qty 2

## 2021-04-27 NOTE — Evaluation (Signed)
Physical Therapy Evaluation Patient Details Name: Connie Snyder MRN: [id number] DOB: June 16, 1927 Today's Date: 04/27/2021  History of Present Illness  88 y.o. female admitted 04/26/21 after fall at home and found to have Right proximal thigh and hip hematoma.  PMHx significant for dementia, Sjogren's disease/arthritis, and L THA 12-22-17  Clinical Impression  Pt admitted with above diagnosis. Pt currently with functional limitations due to the deficits listed below (see PT Problem List). Pt will benefit from skilled PT to increase their independence and safety with mobility to allow discharge to the venue listed below.  Pt pleasantly confused and agreeable to mobilize.  Pt reports right hip/thigh pain with movement however eases with rest.  Pt assisted OOB to recliner.  Pt lives with son per notes.  If family is unable to provide assist upon d/c, pt would benefit from SNF.       Recommendations for follow up therapy are one component of a multi-disciplinary discharge planning process, led by the attending physician.  Recommendations may be updated based on patient status, additional functional criteria and insurance authorization.  Follow Up Recommendations Skilled nursing-short term rehab (<3 hours/day)    Assistance Recommended at Discharge Frequent or constant Supervision/Assistance  Patient can return home with the following  A little help with walking and/or transfers;A little help with bathing/dressing/bathroom;Help with stairs or ramp for entrance    Equipment Recommendations Rolling walker (2 wheels)  Recommendations for Other Services       Functional Status Assessment Patient has had a recent decline in their functional status and demonstrates the ability to make significant improvements in function in a reasonable and predictable amount of time.     Precautions / Restrictions Precautions Precautions: Fall Restrictions Weight Bearing Restrictions: No      Mobility  Bed  Mobility Overal bed mobility: Needs Assistance Bed Mobility: Supine to Sit     Supine to sit: Min assist, HOB elevated     General bed mobility comments: assist for Rt LE    Transfers Overall transfer level: Needs assistance Equipment used: Rolling walker (2 wheels) Transfers: Sit to/from Stand, Bed to chair/wheelchair/BSC Sit to Stand: Min assist   Step pivot transfers: Min assist       General transfer comment: verbal cues for hand placement and use of RW, assist to rise and steady, cues for taking steps over to recliner    Ambulation/Gait                  Stairs            Wheelchair Mobility    Modified Rankin (Stroke Patients Only)       Balance Overall balance assessment: Needs assistance, History of Falls         Standing balance support: Bilateral upper extremity supported, Reliant on assistive device for balance Standing balance-Leahy Scale: Poor                               Pertinent Vitals/Pain Pain Assessment Pain Assessment: Faces Faces Pain Scale: Hurts even more Pain Location: right hip/thigh with movement Pain Descriptors / Indicators: Tender, Sore Pain Intervention(s): Repositioned, Monitored during session    Home Living Family/patient expects to be discharged to:: Private residence Living Arrangements: Children (son)   Type of Home: House           Home Equipment: Agricultural consultant (2 wheels);Cane - single point Additional Comments: currently living with son per pt  and chart review    Prior Function Prior Level of Function : Needs assist  Cognitive Assist : Mobility (cognitive);ADLs (cognitive)           Mobility Comments: hx dementia, pt reports being ambulatory and has a RW and cane however does not use them (?accuracy)       Hand Dominance        Extremity/Trunk Assessment        Lower Extremity Assessment Lower Extremity Assessment: Generalized weakness;RLE deficits/detail RLE  Deficits / Details: assist provided due to pain       Communication   Communication: No difficulties  Cognition Arousal/Alertness: Awake/alert Behavior During Therapy: WFL for tasks assessed/performed Overall Cognitive Status: History of cognitive impairments - at baseline                                 General Comments: pt able to follow commands, not orientated to place, time or situation and required reorientation a few times during session        General Comments      Exercises     Assessment/Plan    PT Assessment Patient needs continued PT services  PT Problem List Decreased strength;Decreased activity tolerance;Decreased mobility;Decreased balance;Decreased knowledge of precautions;Decreased knowledge of use of DME;Pain       PT Treatment Interventions Gait training;DME instruction;Functional mobility training;Therapeutic activities;Patient/family education;Balance training;Therapeutic exercise    PT Goals (Current goals can be found in the Care Plan section)  Acute Rehab PT Goals PT Goal Formulation: Patient unable to participate in goal setting Time For Goal Achievement: 05/05/21 Potential to Achieve Goals: Good    Frequency Min 2X/week     Co-evaluation               AM-PAC PT "6 Clicks" Mobility  Outcome Measure Help needed turning from your back to your side while in a flat bed without using bedrails?: A Little Help needed moving from lying on your back to sitting on the side of a flat bed without using bedrails?: A Little Help needed moving to and from a bed to a chair (including a wheelchair)?: A Little Help needed standing up from a chair using your arms (e.g., wheelchair or bedside chair)?: A Little Help needed to walk in hospital room?: A Lot Help needed climbing 3-5 steps with a railing? : A Lot 6 Click Score: 16    End of Session Equipment Utilized During Treatment: Gait belt Activity Tolerance: Patient tolerated treatment  well Patient left: in chair;with call bell/phone within reach;with chair alarm set Nurse Communication: Mobility status PT Visit Diagnosis: Difficulty in walking, not elsewhere classified (R26.2)    Time: 8413-2440 PT Time Calculation (min) (ACUTE ONLY): 21 min   Charges:   PT Evaluation $PT Eval Low Complexity: 1 Low        Kati PT, DPT Acute Rehabilitation Services Pager: [phone] Office: [phone]   Janan Halter [place] 04/27/2021, 1:46 PM

## 2021-04-27 NOTE — Progress Notes (Addendum)
°  Progress Note   Patient: Connie Snyder UJW:[id number] DOB: [birth year] DOA: 04/26/2021     1 DOS: the patient was seen and examined on 04/27/2021   Brief hospital course: [name] is a 88 y.o. F with advanced dmeentia, RA and Sjogren's recently off MTX due to recent PE who presented with fall, followed by right hip pain the day prior to admission, then syncope on the day of admission.  In the ER, imaging showed no hip fracture but a large right thigh hematoma.  Eliquis stopped. Hgb down to 7.6 from 12  Assessment and Plan * Thigh hematoma- (present on admission) - Hold Eliquis -PT eval  Acute blood loss anemia- (present on admission) Hemoglobin down to 6.5 nadir overnight, transfused 1 unit, up to 7.8 today - Trend hemoglobin -Outpatient GI follow-up -Check iron studies  Pulmonary emboli (HCC) - Hold Eliquis  Rheumatoid arthritis (HCC)- (present on admission) - Hold methotrexate  Vascular dementia without behavioral disturbance (HCC)- (present on admission)       Subjective: She has some mild pain, no confusion, fever.  Objective Vital signs were reviewed and unremarkable. Elderly adult female, sitting in bed, interactive, pleasant.  Oriented to self and hospital, otherwise dementia is noted.  Heart rate regular, no murmurs, no lower extremity edema.  Normal respiratory rate and rhythm, lungs clear without rales or wheezes.  There is a small painful mass in her right greater trochanter, this is within the bounds marked on her hip.  Data Reviewed: My review labs and imaging studies is notable for hemoglobin down to 6.5, up to 7.8 this morning, CT head and C-spine unremarkable, CT renal showing no acute findings, and CT of the lumbar spine showing right thigh hematoma.  Patient metabolic panel with normal renal function and electrolytes.  FOBT positive, INR 1.4.  Family Communication: Son at the bedside  Disposition: Status is: Inpatient  Remains inpatient appropriate  because: Patient was admitted for hematoma, she required a transfusion this morning.    No particular treatment is typically needed or possible for thigh hematoma.  If her hemoglobin remained stable over the next 24 hours, we will likely discharge to SNF soon as a bed is available            Author: Alberteen Sam, MD 04/27/2021 6:37 PM  For on call review [email or web address].

## 2021-04-27 NOTE — Assessment & Plan Note (Signed)
Hold Eliquis. ?

## 2021-04-27 NOTE — Assessment & Plan Note (Signed)
Hold methotrexate

## 2021-04-27 NOTE — Progress Notes (Signed)
Initial Nutrition Assessment  DOCUMENTATION CODES:  Not applicable  INTERVENTION:  Adjust diet to mechanical soft for limited dentition, encourage PO intake Ensure Enlive po BID, each supplement provides 350 kcal and 20 grams of protein New measured weight Obtain NFPE, when able, to assess muscle and fat stores.  NUTRITION DIAGNOSIS:  Biting/chewing difficulty related to  (poor dentition) as evidenced by  (RN report of missing teeth).  GOAL:  Patient will meet greater than or equal to 90% of their needs  MONITOR:  PO intake, Supplement acceptance, Weight trends  REASON FOR ASSESSMENT:  Malnutrition Screening Tool    ASSESSMENT:  88 y.o. female with hx of dementia, rheumatoid arthritis and Sjogren's syndrome presented to ER after a syncopal episode at home. Son reports pt had a fall the day PTA and has been complaining of hip pain.   Pt dx with a PE in December and was placed on Eliquis. Hgb found to be low on admission and CT showed right thigh/hip hematoma but no fractures were detected. Eliquis currently on hold. Surgery consulted for management of hematoma but no evidence of compartment syndrome.  At baseline, pt lives with her son who provides care. Unable to provide a nutrition hx due to dementia. Discussed with RN, pt has been confused and disoriented this AM. Reports that pt did eat breakfast but has limited dentition. Would benefit from a mechanical soft diet and nutrition supplements to aid in adequate intake.  Limited weight hx available for pt, will request new to determine if weight loss has occurred.   Medications Reviewed  Labs Reviewed: Calcium corrects to 8.82 for low albumin  NUTRITION - FOCUSED PHYSICAL EXAM: Defer to in-person assessment  Diet Order:   Diet Order             Diet regular Room service appropriate? Yes; Fluid consistency: Thin  Diet effective now                   EDUCATION NEEDS:  Not appropriate for education at this  time  Skin:  Skin Assessment: Reviewed RN Assessment (hematoma to the right thigh)  Last BM:  prior to admission  Height:  Ht Readings from Last 1 Encounters:  04/26/21 5\' 4"  (1.626 m)   Weight:  Wt Readings from Last 1 Encounters:  04/26/21 54.4 kg    Ideal Body Weight:  54.5 kg  BMI:  Body mass index is 20.59 kg/m.  Estimated Nutritional Needs:  Kcal:  1400-1600 kcal/d Protein:  70-80 g/d Fluid:  1.5-1.8 L/d   Ranell Patrick, RD, LDN Clinical Dietitian RD pager # available in [facility]  After hours/weekend pager # available in [facility]

## 2021-04-27 NOTE — Assessment & Plan Note (Addendum)
Appears at baseline - Continue mirtazapine

## 2021-04-27 NOTE — TOC Initial Note (Signed)
Transition of Care [facility](TOC) - Initial/Assessment Note    Patient Details  Name: Connie Snyder MRN: [id number] Date of Birth: 06/03/1927  Transition of Care [facility](TOC) CM/SW Contact:    Lanier ClamMahabir, Eufemio Strahm, RN Phone Number: 04/27/2021, 1:58 PM  Clinical Narrative: PT recc SNF-spoke to son Berna SpareMarcus declineS SNF,[facility]. Family able to transport home.No further CM needs.                  Expected Discharge Plan: Home w Home Health Services Barriers to Discharge: No Barriers Identified   Patient Goals and CMS Choice Patient states their goals for this hospitalization and ongoing recovery are:: home CMS Medicare.gov Compare Post Acute Care list provided to:: Patient Represenative (must comment) [name](Marcus son) Choice offered to / list presented to : Adult Children  Expected Discharge Plan and Services Expected Discharge Plan: Home w Home Health Services   Discharge Planning Services: CM Consult Post Acute Care Choice: Home Health                             [facility]: [facility]/27/21 Time HH Agency Contacted: [phone] [facility]  Prior Living Arrangements/Services   Lives with:: Adult Children Patient language and need for interpreter reviewed:: Yes Do you feel safe going back to the place where you live?: Yes      Need for Family Participation in Patient Care: No (Comment) Care giver support system in place?: Yes (comment) Current home services: DME (rw,3n1) Criminal Activity/Legal Involvement Pertinent to Current Situation/Hospitalization: No - Comment as needed  Activities of Daily Living Home Assistive Devices/Equipment: Environmental consultantWalker (specify type), Bedside commode/3-in-1 ADL Screening (condition at time of admission) Patient's cognitive ability adequate to safely complete daily activities?: No Is the patient deaf or have difficulty hearing?:  Yes Does the patient have difficulty seeing, even when wearing glasses/contacts?: No Does the patient have difficulty concentrating, remembering, or making decisions?: Yes Patient able to express need for assistance with ADLs?: Yes Does the patient have difficulty dressing or bathing?: Yes Independently performs ADLs?: No Communication: Independent Dressing (OT): Needs assistance Is this a change from baseline?: Pre-admission baseline Grooming: Independent Feeding: Independent Bathing: Needs assistance Is this a change from baseline?: Pre-admission baseline Toileting: Needs assistance Is this a change from baseline?: Pre-admission baseline In/Out Bed: Needs assistance Is this a change from baseline?: Pre-admission baseline Walks in Home: Needs assistance Is this a change from baseline?: Pre-admission baseline Does the patient have difficulty walking or climbing stairs?: Yes Weakness of Legs: Both Weakness of Arms/Hands: Both  Permission Sought/Granted Permission sought to share information with : Case Manager Permission granted to share information with : Yes, Verbal Permission Granted  Share Information with NAME: Case Mgmnt           Emotional Assessment Appearance:: Appears stated age            Admission diagnosis:  Thigh hematoma [S70.10XA] Patient Active Problem List   Diagnosis Date Noted   Thigh hematoma 04/26/2021   Acute blood loss anemia 04/26/2021   Rheumatoid arthritis (HCC) 04/26/2021   Altered mental status 11/08/2018   Confusion    Vascular dementia without behavioral disturbance (HCC)    Normocytic normochromic anemia 12/22/2017   S/p left hip fracture 12/22/2017   Malnutrition of moderate degree 12/22/2017   Closed fracture of left hip (  HCC) 12/21/2017   Closed fracture of left proximal humerus 11/30/2017   PCP:  Georgianne Fick, MD Pharmacy:   [facility] [phone] - [place], [place] - [address] AT [address] [facility] [address] [place] [address] Phone: [phone] Fax: [phone]     Social Determinants of Health (SDOH) Interventions    Readmission Risk Interventions No flowsheet data found.

## 2021-04-27 NOTE — Assessment & Plan Note (Addendum)
Hemoglobin down to 6.8 overnight again, transfusing 1 more unit again today.  No rectal bleeding, melena.  Iron studies show iron deficiency.  -Transfuse 2nd unit today - Check posttransfusion H&H -Start IV iron   -Outpatient GI follow-up

## 2021-04-27 NOTE — Progress Notes (Signed)
Patient ID: Connie Snyder, female   DOB: [birth year], 88 y.o.   MRN: [id number] The patient is sitting at her bedside chair and eating dinner.  She is awake and alert and does follow commands.  She obviously shows signs of confusion and dementia but is very pleasant.  I did reassess her right hip and proximal thigh area.  The hematoma appears the same size to me that it did earlier this morning at 7:30 AM when I saw her.  There is no redness.  She is neurovascularly intact distally.  There is no evidence of compartment syndrome.  It is a firm hematoma for sure but thus far I would not recommend any type of surgical intervention.  This will take some time to resolve but it does not look like it is expanding.  Intermittent ice and warm compress will certainly help.  There are no further orthopedic surgical recs at this standpoint.

## 2021-04-27 NOTE — Hospital Course (Signed)
Connie Snyder is a 88 y.o. F with advanced dmeentia, RA and Sjogren's recently off MTX due to recent PE who presented with fall, followed by right hip pain the day prior to admission, then syncope on the day of admission.  In the ER, imaging showed no hip fracture but a large right thigh hematoma.  Eliquis stopped. Hgb down to 7.6 from 12

## 2021-04-27 NOTE — Consult Note (Signed)
Reason for Consult:  Right thigh/hip hematoma Referring Physician:  Hal Hope, MD  Connie Snyder is an 88 y.o. female.  HPI: The patient is a 88 year old female who had a mechanical fall the day before yesterday.  She seemed injured her right hip.  She is on blood thinning medications.  She was brought to the [facility] emergency room yesterday and a CT scan did show an expanding hematoma of her right proximal thigh and hip area.  There is no acute fracture seen.  She does have acute blood loss anemia and was admitted due to this.  Orthopedics is consulted to evaluate and treat the right hip hematoma.  I am at the bedside now and have examined the patient.  Past Medical History:  Diagnosis Date   Arthritis    Sjogren's disease [facility])     Past Surgical History:  Procedure Laterality Date   TOTAL HIP ARTHROPLASTY Left 12/22/2017   Procedure: TOTAL HIP ARTHROPLASTY ANTERIOR APPROACH;  Surgeon: Leandrew Koyanagi, MD;  Location: [place];  Service: Orthopedics;  Laterality: Left;    Family History  Problem Relation Age of Onset   Pulmonary embolism Other     Social History:  reports that she has quit smoking. She has never used smokeless tobacco. She reports current alcohol use. No history on file for drug use.  Allergies:  Allergies  Allergen Reactions   Aricept [Donepezil] Nausea And Vomiting    Medications: I have reviewed the patient's current medications.  Results for orders placed or performed during the hospital encounter of 04/26/21 (from the past 48 hour(s))  CBC with Differential     Status: Abnormal   Collection Time: 04/26/21 12:35 PM  Result Value Ref Range   WBC 8.7 4.0 - 10.5 K/uL   RBC 2.64 (L) 3.87 - 5.11 MIL/uL   Hemoglobin 7.6 (L) 12.0 - 15.0 g/dL   HCT 22.0 (L) 36.0 - 46.0 %   MCV 83.3 80.0 - 100.0 fL   MCH 28.8 26.0 - 34.0 pg   MCHC 34.5 30.0 - 36.0 g/dL   RDW 16.0 (H) 11.5 - 15.5 %   Platelets 253 150 - 400 K/uL   nRBC 0.2 0.0 - 0.2 %   Neutrophils Relative  % 73 %   Neutro Abs 6.3 1.7 - 7.7 K/uL   Lymphocytes Relative 17 %   Lymphs Abs 1.5 0.7 - 4.0 K/uL   Monocytes Relative 9 %   Monocytes Absolute 0.8 0.1 - 1.0 K/uL   Eosinophils Relative 1 %   Eosinophils Absolute 0.1 0.0 - 0.5 K/uL   Basophils Relative 0 %   Basophils Absolute 0.0 0.0 - 0.1 K/uL   Immature Granulocytes 0 %   Abs Immature Granulocytes 0.02 0.00 - 0.07 K/uL    Comment: Performed at [facility], [place] [address] [place], [place] [address]  Comprehensive metabolic panel     Status: Abnormal   Collection Time: 04/26/21 12:35 PM  Result Value Ref Range   Sodium 137 135 - 145 mmol/L   Potassium 4.0 3.5 - 5.1 mmol/L   Chloride 107 98 - 111 mmol/L   CO2 24 22 - 32 mmol/L   Glucose, Bld 138 (H) 70 - 99 mg/dL    Comment: Glucose reference range applies only to samples taken after fasting for at least 8 hours.   BUN 26 (H) 8 - 23 mg/dL   Creatinine, Ser 0.71 0.44 - 1.00 mg/dL   Calcium 8.6 (L) 8.9 - 10.3 mg/dL  Total Protein 6.1 (L) 6.5 - 8.1 g/dL   Albumin 3.1 (L) 3.5 - 5.0 g/dL   AST 24 15 - 41 U/L   ALT 17 0 - 44 U/L   Alkaline Phosphatase 66 38 - 126 U/L   Total Bilirubin 0.8 0.3 - 1.2 mg/dL   GFR, Estimated >60 >60 mL/min    Comment: (NOTE) Calculated using the CKD-EPI Creatinine Equation (2021)    Anion gap 6 5 - 15    Comment: Performed at [facility], [place] [address], [place], [place] [address]  Protime-INR     Status: Abnormal   Collection Time: 04/26/21  4:09 PM  Result Value Ref Range   Prothrombin Time 17.1 (H) 11.4 - 15.2 seconds   INR 1.4 (H) 0.8 - 1.2    Comment: (NOTE) INR goal varies based on device and disease states. Performed at [facility], [place] [address], [place], [place] [address]   POC occult blood, ED Provider will collect     Status: Abnormal   Collection Time: 04/26/21  4:31 PM  Result Value Ref Range   Fecal Occult Bld POSITIVE (A) NEGATIVE  Type and screen [place]     Status: None (Preliminary result)   Collection Time: 04/26/21  5:55 PM  Result Value Ref Range   ABO/RH(D) B POS    Antibody Screen NEG    Sample Expiration 04/29/2021,2359    Unit Number [id number]    Blood Component Type RED CELLS,LR    Unit division 00    Status of Unit ISSUED    Transfusion Status OK TO TRANSFUSE    Crossmatch Result      Compatible Performed at [facility], [place] [address] [place], [place] [address]   Resp Panel by RT-PCR (Flu A&B, Covid) Nasopharyngeal Swab     Status: None   Collection Time: 04/26/21  9:05 PM   Specimen: Nasopharyngeal Swab; Nasopharyngeal(NP) swabs in vial transport medium  Result Value Ref Range   SARS Coronavirus 2 by RT PCR NEGATIVE NEGATIVE    Comment: (NOTE) SARS-CoV-2 target nucleic acids are NOT DETECTED.  The SARS-CoV-2 RNA is generally detectable in upper respiratory specimens during the acute phase of infection. The lowest concentration of SARS-CoV-2 viral copies this assay can detect is 138 copies/mL. A negative result does not preclude SARS-Cov-2 infection and should not be used as the sole basis for treatment or other patient management decisions. A negative result may occur with  improper specimen collection/handling, submission of specimen other than nasopharyngeal swab, presence of viral mutation(s) within the areas targeted by this assay, and inadequate number of viral copies(<138 copies/mL). A negative result must be combined with clinical observations, patient history, and epidemiological information. The expected result is Negative.  Fact Sheet for Patients:  EntrepreneurPulse.com.au  Fact Sheet for Healthcare Providers:  IncredibleEmployment.be  This test is no t yet approved or cleared by the [place] FDA and  has been authorized for detection and/or diagnosis of SARS-CoV-2 by FDA under an Emergency Use Authorization (EUA).  This EUA will remain  in effect (meaning this test can be used) for the duration of the COVID-19 declaration under Section 564(b)(1) of the Act, 21 U.S.C.section 360bbb-3(b)(1), unless the authorization is terminated  or revoked sooner.       Influenza A by PCR NEGATIVE NEGATIVE   Influenza B by PCR NEGATIVE NEGATIVE    Comment: (NOTE) The Xpert Xpress SARS-CoV-2/FLU/RSV plus assay is intended as an aid  in the diagnosis of influenza from Nasopharyngeal swab specimens and should not be used as a sole basis for treatment. Nasal washings and aspirates are unacceptable for Xpert Xpress SARS-CoV-2/FLU/RSV testing.  Fact Sheet for Patients: EntrepreneurPulse.com.au  Fact Sheet for Healthcare Providers: IncredibleEmployment.be  This test is not yet approved or cleared by the [place] FDA and has been authorized for detection and/or diagnosis of SARS-CoV-2 by FDA under an Emergency Use Authorization (EUA). This EUA will remain in effect (meaning this test can be used) for the duration of the COVID-19 declaration under Section 564(b)(1) of the Act, 21 U.S.C. section 360bbb-3(b)(1), unless the authorization is terminated or revoked.  Performed at [facility], [place] [address] [place], [place] [address]   CBC     Status: Abnormal   Collection Time: 04/26/21 10:42 PM  Result Value Ref Range   WBC 10.8 (H) 4.0 - 10.5 K/uL   RBC 2.26 (L) 3.87 - 5.11 MIL/uL   Hemoglobin 6.6 (LL) 12.0 - 15.0 g/dL    Comment: REPEATED TO VERIFY THIS CRITICAL RESULT HAS VERIFIED AND BEEN CALLED TO BROOKLYN WARNER RN. BY TAMEECO CALDWELL ON 01 23 2023 AT 2321, AND HAS BEEN READ BACK.     HCT 19.2 (L) 36.0 - 46.0 %   MCV 85.0 80.0 - 100.0 fL   MCH 29.2 26.0 - 34.0 pg   MCHC 34.4 30.0 - 36.0 g/dL   RDW 16.1 (H) 11.5 - 15.5 %   Platelets 221 150 - 400 K/uL   nRBC 0.2 0.0 - 0.2 %    Comment: Performed at [facility], [place]  [address] [place], [place] [address]  CBC     Status: Abnormal   Collection Time: 04/27/21 12:27 AM  Result Value Ref Range   WBC 10.4 4.0 - 10.5 K/uL   RBC 2.24 (L) 3.87 - 5.11 MIL/uL   Hemoglobin 6.5 (LL) 12.0 - 15.0 g/dL    Comment: CRITICAL VALUE NOTED.  VALUE IS CONSISTENT WITH PREVIOUSLY REPORTED AND CALLED VALUE. REPEATED TO VERIFY    HCT 18.8 (L) 36.0 - 46.0 %   MCV 83.9 80.0 - 100.0 fL   MCH 29.0 26.0 - 34.0 pg   MCHC 34.6 30.0 - 36.0 g/dL   RDW 16.0 (H) 11.5 - 15.5 %   Platelets 204 150 - 400 K/uL   nRBC 0.2 0.0 - 0.2 %    Comment: Performed at [facility], [place] [address], [place], [place] [address]  Prepare RBC (crossmatch)     Status: None   Collection Time: 04/27/21 12:27 AM  Result Value Ref Range   Order Confirmation      ORDER PROCESSED BY BLOOD BANK Performed at [facility], [place] [address] [place], [place] [address]   Glucose, capillary     Status: Abnormal   Collection Time: 04/27/21  2:29 AM  Result Value Ref Range   Glucose-Capillary 124 (H) 70 - 99 mg/dL    Comment: Glucose reference range applies only to samples taken after fasting for at least 8 hours.  CBC     Status: Abnormal   Collection Time: 04/27/21  4:56 AM  Result Value Ref Range   WBC 9.9 4.0 - 10.5 K/uL   RBC 2.61 (L) 3.87 - 5.11 MIL/uL   Hemoglobin 7.8 (L) 12.0 - 15.0 g/dL   HCT 22.0 (L) 36.0 - 46.0 %   MCV 84.3 80.0 - 100.0 fL   MCH 29.9 26.0 - 34.0 pg  MCHC 35.5 30.0 - 36.0 g/dL   RDW 15.1 11.5 - 15.5 %   Platelets 187 150 - 400 K/uL   nRBC 0.2 0.0 - 0.2 %    Comment: Performed at [facility], [place] [address], [place], [place] [address]  Glucose, capillary     Status: None   Collection Time: 04/27/21  6:03 AM  Result Value Ref Range   Glucose-Capillary 92 70 - 99 mg/dL    Comment: Glucose reference range applies only to samples taken after fasting for at least 8 hours.  Basic metabolic panel     Status: Abnormal    Collection Time: 04/27/21  6:10 AM  Result Value Ref Range   Sodium 135 135 - 145 mmol/L   Potassium 4.4 3.5 - 5.1 mmol/L   Chloride 107 98 - 111 mmol/L   CO2 21 (L) 22 - 32 mmol/L   Glucose, Bld 98 70 - 99 mg/dL    Comment: Glucose reference range applies only to samples taken after fasting for at least 8 hours.   BUN 29 (H) 8 - 23 mg/dL   Creatinine, Ser 0.59 0.44 - 1.00 mg/dL   Calcium 8.1 (L) 8.9 - 10.3 mg/dL   GFR, Estimated >60 >60 mL/min    Comment: (NOTE) Calculated using the CKD-EPI Creatinine Equation (2021)    Anion gap 7 5 - 15    Comment: Performed at [facility], [place] [address] [place], [place] [address]    CT Head Wo Contrast  Result Date: 04/26/2021 CLINICAL DATA:  Head trauma, minor (Age >= 65y).  Syncope. EXAM: CT HEAD WITHOUT CONTRAST TECHNIQUE: Contiguous axial images were obtained from the base of the skull through the vertex without intravenous contrast. RADIATION DOSE REDUCTION: This exam was performed according to the departmental dose-optimization program which includes automated exposure control, adjustment of the mA and/or kV according to patient size and/or use of iterative reconstruction technique. COMPARISON:  11/08/2018 FINDINGS: Brain: There is atrophy and chronic small vessel disease changes. No acute intracranial abnormality. Specifically, no hemorrhage, hydrocephalus, mass lesion, acute infarction, or significant intracranial injury. Vascular: No hyperdense vessel or unexpected calcification. Skull: No acute calvarial abnormality. Sinuses/Orbits: No acute findings Other: None IMPRESSION: Atrophy, chronic microvascular disease. No acute intracranial abnormality. Electronically Signed   By: Rolm Baptise M.D.   On: 04/26/2021 19:55   CT Hip Right Wo Contrast  Result Date: 04/26/2021 CLINICAL DATA:  Fall, right hip pain, osteopenia. EXAM: CT OF THE RIGHT HIP WITHOUT CONTRAST TECHNIQUE: Multidetector CT imaging of the right hip was performed  according to the standard protocol. Multiplanar CT image reconstructions were also generated. RADIATION DOSE REDUCTION: This exam was performed according to the departmental dose-optimization program which includes automated exposure control, adjustment of the mA and/or kV according to patient size and/or use of iterative reconstruction technique. COMPARISON:  None. FINDINGS: Bones/Joint/Cartilage The osseous structures are diffusely osteopenic. No acute fracture or dislocation identified. Remote healed fractures of the right inferior pubic ramus and remote nonunited fracture of the right pubic symphysis are identified. A linear lucency is seen within the anterior column of the right hip best appreciated on axial image # 16/4 and along the superior margin of the superior pubic ramus best seen on coronal image # 11/8 which appear corticated and may reflect minimally displaced remote fractures. 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 Left total hip arthroplasty has been performed. Ligaments Suboptimally assessed by CT. Muscles and Tendons Moderate muscular atrophy. Iliopsoas and gluteal  tendons appear intact. Degenerative enthesopathy noted at the origin of the hamstring tendon. Soft tissues A lenticular hyperdense collection is seen within the subcutaneous soft tissues of the right hip lateral to the greater trochanter compatible with a large subcutaneous hematoma better appreciated on concurrently performed CT examination of the abdomen and pelvis. There is extensive surrounding interstitial hemorrhage within the visualized right lower extremity. No intramuscular hemorrhage or hematoma identified. IMPRESSION: Marked osteopenia. No acute fracture or dislocation identified. Linear lucency within the anterior column of the right hip likely reflects a remote, healed fracture, however, if there is strong clinical suspicion of acute, nondisplaced fracture, MRI examination would be more sensitive given the  degree of osteopenia. Remote fractures of the right pubic symphysis and inferior pubic ramus as described above. Lentiform hematoma within the subcutaneous soft tissues of the right hip with extensive surrounding interstitial hemorrhage within the subcutaneous fat of the visualized right lower extremity. Electronically Signed   By: Fidela Salisbury M.D.   On: 04/26/2021 20:37   CT L-SPINE NO CHARGE  Result Date: 04/26/2021 CLINICAL DATA:  Right hip pain, fall, stress fracture suspected EXAM: CT LUMBAR SPINE WITHOUT CONTRAST TECHNIQUE: Multidetector CT imaging of the lumbar spine was performed without intravenous contrast administration. Multiplanar CT image reconstructions were also generated. RADIATION DOSE REDUCTION: This exam was performed according to the departmental dose-optimization program which includes automated exposure control, adjustment of the mA and/or kV according to patient size and/or use of iterative reconstruction technique. COMPARISON:  None. FINDINGS: Segmentation: 5 lumbar type vertebrae. Alignment: 2 mm anterolisthesis of L3-4 and L4-5 are likely degenerative in nature. Vertebrae: The osseous structures are diffusely osteopenic. There are insufficiency fractures of T12l (20%), L1 (40-50%), L2 (75%), L4 (30-40%), and L5 (minimal). There is minimal retropulsion of the posterosuperior osteophytic spurs of L1, L2, and L4 with resultant mild central canal stenosis of the a lumbar spine posterior to L2 within AP diameter of 8 mm. No acute fracture. Remote fractures of the sacrum are incidentally noted. Paraspinal and other soft tissues: Paraspinal soft tissues are better visualized on concurrently performed noncontrast CT examination of the abdomen and pelvis which were described to greater extent in that report. High-grade central canal stenosis is noted at L3-4 secondary to facet arthrosis, hypertrophy ligament flavum, and broad-based disc bulge. Similar findings are noted at L4-5. No definite  canal hematoma. Disc levels: Advanced degenerative disc disease is seen throughout the lumbar spine with disc space narrowing, endplate remodeling, and vacuum disc phenomena at L4-5. IMPRESSION: No acute fracture or listhesis of the lumbar spine. Numerous insufficiency fractures, as described above, with minimal retropulsion of L1, L2, and L4 resulting in mild central canal stenosis at L2. Multifactorial high-grade central canal stenosis at L3-4 and L4-5 Remote fractures of the sacrum. Electronically Signed   By: Fidela Salisbury M.D.   On: 04/26/2021 20:31   CT Renal Stone Study  Result Date: 04/26/2021 CLINICAL DATA:  Fall, flank pain, nephrolithiasis EXAM: CT ABDOMEN AND PELVIS WITHOUT CONTRAST TECHNIQUE: Multidetector CT imaging of the abdomen and pelvis was performed following the standard protocol without IV contrast. RADIATION DOSE REDUCTION: This exam was performed according to the departmental dose-optimization program which includes automated exposure control, adjustment of the mA and/or kV according to patient size and/or use of iterative reconstruction technique. COMPARISON:  None. FINDINGS: Lower chest: Small congenital left diaphragmatic hernia. Mild right basilar atelectasis or scar. Multiple radiodensities are seen within the a distal esophagus which appear intramural in nature and may represent ingested  tablets within epiphrenic diverticulum or within a ulcer cavity. These were not seen on prior CT examination of 11/08/2018. Hepatobiliary: 4.3 cm simple cyst within the inferior right hepatic lobe. Liver otherwise unremarkable. Cholelithiasis without pericholecystic inflammatory change. No intra or extrahepatic biliary ductal dilation. Pancreas: Unremarkable Spleen: Unremarkable Adrenals/Urinary Tract: Adrenal glands are unremarkable. Kidneys are normal, without renal calculi, focal lesion, or hydronephrosis. Bladder is distended but is otherwise unremarkable. Stomach/Bowel: Moderate stool  throughout the colon without evidence of obstruction. Moderate sigmoid diverticulosis without superimposed acute inflammatory change. Stomach, small bowel, and large bowel are otherwise unremarkable. Appendix normal. No free intraperitoneal gas or fluid. Vascular/Lymphatic: Moderate aortoiliac atherosclerotic calcification. No aortic aneurysm. No pathologic adenopathy within the abdomen and pelvis. Reproductive: A 32 mm simple appearing cyst is seen within the right ovary. The pelvic organs are otherwise unremarkable. Other: A large elliptical hyperdense collection is seen within the subcutaneous soft tissues lateral to the greater trochanter of the right hip compatible with a subcutaneous hematoma measuring at least 6.4 x 9.6 x 16.3 cm in greatest dimension. There is interstitial hemorrhage surrounding the dominant lenticular collection within the subcutaneous fat of the right upper extremity and right flank. No active extravasation identified. Musculoskeletal: Remote fractures of the right pubic symphysis and inferior pubic ramus. There are insufficiency fractures of T12, L1, L2, L4, and L5, most severel at L2 with 60-70% loss of height. There is minimal retropulsion of osteophytic spurring with resultant mild central canal stenosis at L2. Milder, similar changes are noted at T12, L1, and L4. These fractures appear remote in nature. Superimposed advanced degenerative changes seen throughout the lumbar spine. Left total hip arthroplasty has been performed. No acute bone abnormality. IMPRESSION: Minimum 16.3 cm lentiform hematoma within the subcutaneous soft tissues lateral to the right hip. No active extravasation. Extensive surrounding interstitial hemorrhage or edema within the subcutaneous fat of the right flank and visualized right lower extremity. No acute fracture or dislocation. Numerous insufficiency fractures throughout the visualized thoracolumbar spine with minimal retropulsion of osteophytic spurs  resulting in mild central canal stenosis at L2. Rounded hyperdense foreign bodies within the distal esophagus which may reflect ingested tablets within an epiphrenic diverticulum or within a ulcer crater given its intramural appearance. This could be better assessed with fluoroscopic esophagram or endoscopy once the patient's acute issues have resolved. Cholelithiasis. Moderate stool throughout the colon without evidence of obstruction. Moderate distal colonic diverticulosis without superimposed acute inflammatory change. 32 mm right adnexal simple cyst Recommend follow-up US in 6-12 months. Note: This recommendation does not apply to premenarchal patients and to those with increased risk (genetic, family history, elevated tumor markers or other high-risk factors) of ovarian cancer. Reference: JACR 2020 Feb; 17(2):248-254 Aortic Atherosclerosis (ICD10-I70.0). Electronically Signed   By: Fidela Salisbury M.D.   On: 04/26/2021 20:22   DG Hip Unilat W or Wo Pelvis 2-3 Views Right  Result Date: 04/26/2021 CLINICAL DATA:  Right hip pain following a fall from a syncopal episode today. EXAM: DG HIP (WITH OR WITHOUT PELVIS) 2-3V RIGHT COMPARISON:  None. FINDINGS: There is insufficient internal rotation of the leg on the frontal views of the hip with no gross fracture or dislocation seen. There is also incomplete visualization of the right femoral head and neck on the lateral views due to overlapping of a left hip prosthesis. There is some bony irregularity at the medial aspect of the right inferior pubic ramus without visible acute fracture lines. IMPRESSION: 1. Incomplete visualization of the right hip due to limitations  described above. There is a continued clinical concern for a possible fracture, a right hip CT without contrast would be recommended. 2. Probable old, healed fracture of the medial aspect of the right inferior pubic ramus at the junction with the pubic body. An acute fracture is less likely but not  excluded. This could also be evaluated at the time of CT. Electronically Signed   By: Claudie Revering M.D.   On: 04/26/2021 14:08    Review of Systems Blood pressure 127/87, pulse 85, temperature 98.3 F (36.8 C), temperature source Oral, resp. rate 18, height 5\' 4"  (1.626 m), weight 54.4 kg, SpO2 94 %. Physical Exam Vitals reviewed.  Musculoskeletal:     Right hip: Tenderness present.       Legs:   The remainder of the patient's right lower extremity is normal in terms of motor and sensory function as well as perfusion.  Assessment/Plan: Right proximal thigh and hip hematoma  There is no evidence of compartment syndrome and this does not seem to be expanding.  Usually a hematoma such as this is treated with observation as well as alternating ice and warm compress.  Attempts to mobilize the patient as comfort allows is certainly recommended.  I will reexamine this hematoma late today.  Thus far there is no indication for surgical intervention.  Mcarthur Rossetti 04/27/2021, 7:35 AM

## 2021-04-28 LAB — CBC
HCT: 19.1 % — ABNORMAL LOW (ref 36.0–46.0)
Hemoglobin: 6.8 g/dL — CL (ref 12.0–15.0)
MCH: 30 pg (ref 26.0–34.0)
MCHC: 35.6 g/dL (ref 30.0–36.0)
MCV: 84.1 fL (ref 80.0–100.0)
Platelets: 153 10*3/uL (ref 150–400)
RBC: 2.27 MIL/uL — ABNORMAL LOW (ref 3.87–5.11)
RDW: 15.4 % (ref 11.5–15.5)
WBC: 7.6 10*3/uL (ref 4.0–10.5)
nRBC: 0.3 % — ABNORMAL HIGH (ref 0.0–0.2)

## 2021-04-28 LAB — PREPARE RBC (CROSSMATCH)

## 2021-04-28 LAB — GLUCOSE, CAPILLARY
Glucose-Capillary: 101 mg/dL — ABNORMAL HIGH (ref 70–99)
Glucose-Capillary: 106 mg/dL — ABNORMAL HIGH (ref 70–99)
Glucose-Capillary: 89 mg/dL (ref 70–99)
Glucose-Capillary: 90 mg/dL (ref 70–99)
Glucose-Capillary: 91 mg/dL (ref 70–99)

## 2021-04-28 LAB — HEMOGLOBIN AND HEMATOCRIT, BLOOD
HCT: 22.4 % — ABNORMAL LOW (ref 36.0–46.0)
Hemoglobin: 7.9 g/dL — ABNORMAL LOW (ref 12.0–15.0)

## 2021-04-28 MED ORDER — SODIUM CHLORIDE 0.9% IV SOLUTION: Freq: Once | INTRAVENOUS | Status: AC

## 2021-04-28 MED ORDER — SODIUM CHLORIDE 0.9 % IV SOLN: 250.0000 mg | Freq: Once | INTRAVENOUS | Status: DC

## 2021-04-28 MED ORDER — SODIUM CHLORIDE 0.9 % IV SOLN
125.0000 mg | Freq: Every day | INTRAVENOUS | Status: AC
  Administered 2021-04-28: 13:00:00 125 mg via INTRAVENOUS
  Filled 2021-04-28: qty 10

## 2021-04-28 MED ORDER — SODIUM CHLORIDE 0.9 % IV SOLN
250.0000 mg | Freq: Once | INTRAVENOUS | Status: AC
  Administered 2021-04-29: 250 mg via INTRAVENOUS
  Filled 2021-04-28: qty 20

## 2021-04-28 NOTE — Progress Notes (Signed)
°   04/28/21 0500  Provider Notification  Provider Name/Title Ulla Gallo, NP  Date Provider Notified 04/28/21  Time Provider Notified 0500  Notification Type Page  Notification Reason Critical result  Test performed and critical result Hgb = 6.8  Date Critical Result Received 04/28/21  Time Critical Result Received 0455  Provider response See new orders  Date of Provider Response 04/28/21  Time of Provider Response [phone]

## 2021-04-28 NOTE — Progress Notes (Signed)
Patient ID: Connie Snyder, female   DOB: [birth year], 88 y.o.   MRN: [id number] Still no change in hematoma size of her right proximal thigh/hip area.  Does not seem to be expanding.  She does move her right foot and ankle for me.  The hematoma is not firm and there is no evidence of compartment syndrome.  Thus far, no indication for surgery.

## 2021-04-28 NOTE — Progress Notes (Addendum)
°  Progress Note   Patient: Connie Snyder [id number] DOB: 05/30/1927 DOA: 04/26/2021     2 DOS: the patient was seen and examined on 04/28/2021        Brief hospital course: Connie Snyder is a 88 y.o. F with advanced dmeentia, RA and Sjogren's recently off MTX due to recent PE who presented with fall, followed by right hip pain the day prior to admission, then syncope on the day of admission.  In the ER, imaging showed no hip fracture but a large right thigh hematoma.  Eliquis stopped. Hgb down to 7.6 from 12       Assessment and Plan * Thigh hematoma- (present on admission) - Hold Eliquis  Acute blood loss anemia- (present on admission) Hemoglobin down to 6.8 overnight again, transfusing 1 more unit again today.  No rectal bleeding, melena.  Iron studies show iron deficiency.  -Transfuse 2nd unit today - Check posttransfusion H&H -Start IV iron   -Outpatient GI follow-up   Pulmonary emboli (H[facility]) - Hold Eliquis  Rheumatoid arthritis ([place])- (present on admission) - Hold methotrexate  Vascular dementia without behavioral disturbance ([place])- (present on admission)  Appears at baseline - Continue mirtazapine     Subjective: Patient has no complaints.  No melena or hematochezia, no vomiting, no confusion.  She has mild pain at the right hip, no change from yesterday.  Objective Vital signs were reviewed and unremarkable. Elderly adult female, sleeping, arouses sluggishly, mostly does not follow commands for me because she is sleepy.  Nursing report no new complaints.  Face symmetric, moves upper extremities with generalized weakness but symmetric strength.  There is no swelling of the right hip, no significant change MRI from yesterday.  Agree with orthopedics assessment, no firmness or evidence of compartment syndrome, moves the distal extremity normally.  Heart rate regular, no murmurs, no lower extremity edema, respiratory rate normal, lungs clear without rales or  wheezes.  Data Reviewed: Complete blood count notable for hemoglobin down to 6.8 this morning.  Platelets and white blood cells normal.  Iron studies show iron deficiency   Family Communication: Spoke with son by [employer]  Disposition: Status is: Inpatient  Remains inpatient appropriate because: She will require ongoing transfusion of blood, monitoring of hemoglobin, IV iron  If hemoglobin stable tomorrow, will repeat IV iron tomorrow morning and then discharge home.          Author: Edwin Dada, MD 04/28/2021 1:59 PM  For on call review [email or web address].

## 2021-04-29 LAB — TYPE AND SCREEN
ABO/RH(D): B POS
Antibody Screen: NEGATIVE
Unit division: 0
Unit division: 0

## 2021-04-29 LAB — CBC
HCT: 22.4 % — ABNORMAL LOW (ref 36.0–46.0)
Hemoglobin: 7.8 g/dL — ABNORMAL LOW (ref 12.0–15.0)
MCH: 30.1 pg (ref 26.0–34.0)
MCHC: 34.8 g/dL (ref 30.0–36.0)
MCV: 86.5 fL (ref 80.0–100.0)
Platelets: 177 10*3/uL (ref 150–400)
RBC: 2.59 MIL/uL — ABNORMAL LOW (ref 3.87–5.11)
RDW: 16.1 % — ABNORMAL HIGH (ref 11.5–15.5)
WBC: 5.4 10*3/uL (ref 4.0–10.5)
nRBC: 0.6 % — ABNORMAL HIGH (ref 0.0–0.2)

## 2021-04-29 LAB — BPAM RBC
Blood Product Expiration Date: 202302202359
Blood Product Expiration Date: 202302212359
ISSUE DATE / TIME: 202301240131
ISSUE DATE / TIME: 202301250543
Unit Type and Rh: 7300
Unit Type and Rh: 7300

## 2021-04-29 LAB — GLUCOSE, CAPILLARY
Glucose-Capillary: 81 mg/dL (ref 70–99)
Glucose-Capillary: 132 mg/dL — ABNORMAL HIGH (ref 70–99)

## 2021-04-29 NOTE — Progress Notes (Signed)
Physical Therapy Treatment Patient Details Name: Connie Snyder MRN: [id number] DOB: [birth year] Today's Date: 04/29/2021   History of Present Illness 88 y.o. female admitted 04/26/21 after fall at home and found to have Right proximal thigh and hip hematoma.  PMHx significant for dementia, Sjogren's disease/arthritis, and L THA 12-22-17    PT Comments    Pt agreeable for OOB.  Pt assisted with standing and few steps over to recliner.  Pt more vocal about right hip/thigh pain today then previous session and felt unable to ambulate.  Per chart review, family plans to take pt home so updated d/c recommendations.    Recommendations for follow up therapy are one component of a multi-disciplinary discharge planning process, led by the attending physician.  Recommendations may be updated based on patient status, additional functional criteria and insurance authorization.  Follow Up Recommendations  Home health PT     Assistance Recommended at Discharge Frequent or constant Supervision/Assistance  Patient can return home with the following A little help with bathing/dressing/bathroom;Help with stairs or ramp for entrance;A lot of help with walking and/or transfers   Equipment Recommendations  Rolling walker (2 wheels)    Recommendations for Other Services       Precautions / Restrictions Precautions Precautions: Fall Restrictions Weight Bearing Restrictions: No     Mobility  Bed Mobility Overal bed mobility: Needs Assistance Bed Mobility: Supine to Sit     Supine to sit: Max assist     General bed mobility comments: pt reporting more right hip/thigh pain today and required more assist for Rt LE and scooting to EOB so utilized bed pad    Transfers Overall transfer level: Needs assistance Equipment used: Rolling walker (2 wheels) Transfers: Sit to/from Stand Sit to Stand: Min assist   Step pivot transfers: Min assist       General transfer comment: verbal cues for hand  placement and use of RW, assist to rise and steady, cues for taking steps over to recliner    Ambulation/Gait               General Gait Details: pt felt unable to ambulate   Stairs             Wheelchair Mobility    Modified Rankin (Stroke Patients Only)       Balance Overall balance assessment: Needs assistance, History of Falls         Standing balance support: Bilateral upper extremity supported, Reliant on assistive device for balance Standing balance-Leahy Scale: Poor                              Cognition Arousal/Alertness: Awake/alert Behavior During Therapy: WFL for tasks assessed/performed Overall Cognitive Status: History of cognitive impairments - at baseline                                 General Comments: pt able to follow commands, asks the same questions during session        Exercises      General Comments        Pertinent Vitals/Pain Pain Assessment Pain Assessment: Faces Faces Pain Scale: Hurts whole lot Pain Location: right hip/thigh with movement Pain Descriptors / Indicators: Guarding, Grimacing, Sore Pain Intervention(s): Repositioned, Monitored during session    Home Living  Prior Function            PT Goals (current goals can now be found in the care plan section) Progress towards PT goals: Progressing toward goals    Frequency    Min 2X/week      PT Plan Discharge plan needs to be updated    Co-evaluation              AM-PAC PT "6 Clicks" Mobility   Outcome Measure  Help needed turning from your back to your side while in a flat bed without using bedrails?: A Little Help needed moving from lying on your back to sitting on the side of a flat bed without using bedrails?: A Little Help needed moving to and from a bed to a chair (including a wheelchair)?: A Little Help needed standing up from a chair using your arms (e.g., wheelchair or  bedside chair)?: A Little Help needed to walk in hospital room?: A Lot Help needed climbing 3-5 steps with a railing? : A Lot 6 Click Score: 16    End of Session Equipment Utilized During Treatment: Gait belt Activity Tolerance: Patient tolerated treatment well Patient left: in chair;with call bell/phone within reach;with chair alarm set Nurse Communication: Mobility status PT Visit Diagnosis: Difficulty in walking, not elsewhere classified (R26.2)     Time: [id number] PT Time Calculation (min) (ACUTE ONLY): 18 min  Thomasene MohairKati PT, DPT Acute Rehabilitation Services Pager: [phone] Office: [phone]    Connie Snyder 04/29/2021, 12:48 PM

## 2021-04-29 NOTE — Discharge Summary (Signed)
Physician Discharge Summary   Patient: Connie Snyder MRN: [id number] DOB: 04/27/1927  Admit date:     04/26/2021  Discharge date: 04/29/21  Discharge Physician: Alberteen Sam   PCP: Georgianne Fick, MD   Recommendations at discharge:  Follow up with PCP Dr. Nicholos Johns in 1 week Dr. Nicholos Johns: Please check H/H in 1 week (Hgb at d/c 7.8 g/dL) Dr. Nicholos Johns: Please resume Eliquis in 2-3 weeks Dr. Nicholos Johns: Please re-check iron studies in 6 weeks Dr. Nicholos Johns: Please note FOBT+ and discuss risks or harms of outpatient GI work up with son     Discharge Diagnoses Principal Problem:   Acute blood loss anemia due to thigh hematoma Active Problems:   Vascular dementia without behavioral disturbance (HCC)   Rheumatoid arthritis (HCC)   Pulmonary emboli [facility])     Hospital Course   Mrs. Connie Snyder is a 88 y.o. F with advanced dmeentia, RA and Sjogren's recently off MTX due to recent PE who presented with fall, followed by right hip pain the day prior to admission, then syncope on the day of admission.  In the ER, imaging showed no hip fracture but a large right thigh hematoma.  Eliquis stopped. Hgb down to 7.6 from 12     * Thigh hematoma- (present on admission) Acute blood loss anemia- (present on admission) Patient was admitted, Eliquis held.  Hemoglobin trended down to 6.5 and she was transfused 1 unit with appropriate improvement, it trended down again to 6.8 she was transfused a second unit.  Iron studies showed iron deficiency and she was given IV iron x2.  Subsequent hemoglobin stable, patient asymptomatic, worked with physical therapy.  Skilled nursing rehab was recommended, family declined and elected to take her home with equipment.  Close follow-up.    Positive fecal occult blood This was an incidental finding.  She had no melena or hematochezia in the hospital.  Given her age and dementia, I am uncertain the utility of colonoscopy.  Defer this  to PCP for discussion of goals of care and utility of GI work-up for iron deficiency anemia.  Pulmonary emboli (HCC)  Rheumatoid arthritis (HCC)- (present on admission)  Vascular dementia without behavioral disturbance (HCC)- (present on admission)           Pain control - [place] Controlled Substance Reporting System database was reviewed.     Consultants: Orthopedics, Dr. Magnus Ivan   Disposition: Home Diet recommendation: Regular diet  DISCHARGE MEDICATION: Allergies as of 04/29/2021       Reactions   Aricept [donepezil] Nausea And Vomiting        Medication List     STOP taking these medications    baclofen 10 MG tablet Commonly known as: LIORESAL   celecoxib 100 MG capsule Commonly known as: CELEBREX   Eliquis 5 MG Tabs tablet Generic drug: apixaban   HYDROcodone-acetaminophen 5-325 MG tablet Commonly known as: Norco   methotrexate 2.5 MG tablet Commonly known as: RHEUMATREX   ondansetron 4 MG tablet Commonly known as: ZOFRAN   potassium chloride SA 20 MEQ tablet Commonly known as: KLOR-CON M   senna-docusate 8.6-50 MG tablet Commonly known as: Senokot-S       TAKE these medications    docusate sodium 100 MG capsule Commonly known as: COLACE Take 100 mg by mouth daily.   feeding supplement Liqd Take 1 Container by mouth 2 (two) times daily between meals.   ferrous sulfate 325 (65 FE) MG tablet Take 325 mg by mouth daily with breakfast.  Folic Acid-Vit [id number] 2.5-25-1 MG Tabs tablet Commonly known as: FOLBEE Take 1 tablet by mouth daily.   hydrOXYzine 25 MG tablet Commonly known as: ATARAX Take 25 mg by mouth 2 (two) times daily.   mirtazapine 30 MG tablet Commonly known as: REMERON Take 30 mg by mouth at bedtime.   Multivitamin Women 50+ Tabs Take 1 tablet by mouth daily with breakfast.   Vitamin D-3 25 MCG (1000 UT) Caps Take 1,000 Units by mouth daily.        Follow-up Information     Care, [facility] Follow up.   Specialty: [place] Why: [facility] physical therapy/occupational therapy Contact information: [place] [address] [phone]         Merrilee Seashore, MD. Schedule an appointment as soon as possible for a visit in 2 week(s).   Specialty: Internal Medicine Contact information: [address] [place] [address] [phone]         Mcarthur Rossetti, MD. Schedule an appointment as soon as possible for a visit in 2 week(s).   Specialty: Orthopedic Surgery Contact information: [place] [address] [phone]                 Discharge Instructions     Discharge instructions   Complete by: As directed    From Dr. Loleta Books: You were admitted for pain after a fall. X-ray showed that there was no broken bone, thankfully. Further imaging though, did show that there was a hematoma (a large bruise, or pocket of blood) that had formed on your right hip. For now, do not take your Eliquis Go see Dr. Ashby Dawes in 2 weeks Have him check your blood level  Take iron supplements (ferrous sulfate) daily  Have Dr. Ashby Dawes check your iron level in 6 weeks  Work with PT (physical therapy) to get stronger  For pain from the hip, take acetaminophen 1000 mg (two tabs) up to three times daily  Resume your other home medicines besides Eliquis and follow up with Dr. Ashby Dawes   Increase activity slowly   Complete by: As directed           Discharge Exam: Filed Weights   04/26/21 1226  Weight: 54.4 kg   General: Pt is resting, but wakes up, answers appropriately, pleasant, no obvious distress or pain   Cardiovascular: RRR, nl S1-S2, no murmurs appreciated.   No LE edema.   Respiratory: Normal respiratory rate and rhythm.  CTAB without rales or wheezes. Abdominal: Abdomen soft and non-tender.  No distension or HSM.   Neuro/Psych: Strength symmetric in upper  and lower extremities.  Judgment and insight appear markedly impaired by dementia but at baseline   Condition at discharge: fair  The results of significant diagnostics from this hospitalization (including imaging, microbiology, ancillary and laboratory) are listed below for reference.   Imaging Studies: CT Head Wo Contrast  Result Date: 04/26/2021 CLINICAL DATA:  Head trauma, minor (Age >= 65y).  Syncope. EXAM: CT HEAD WITHOUT CONTRAST TECHNIQUE: Contiguous axial images were obtained from the base of the skull through the vertex without intravenous contrast. RADIATION DOSE REDUCTION: This exam was performed according to the departmental dose-optimization program which includes automated exposure control, adjustment of the mA and/or kV according to patient size and/or use of iterative reconstruction technique. COMPARISON:  11/08/2018 FINDINGS: Brain: There is atrophy and chronic small vessel disease changes. No acute intracranial abnormality. Specifically, no hemorrhage, hydrocephalus, mass lesion,  acute infarction, or significant intracranial injury. Vascular: No hyperdense vessel or unexpected calcification. Skull: No acute calvarial abnormality. Sinuses/Orbits: No acute findings Other: None IMPRESSION: Atrophy, chronic microvascular disease. No acute intracranial abnormality. Electronically Signed   By: Rolm Baptise M.D.   On: 04/26/2021 19:55   CT Hip Right Wo Contrast  Result Date: 04/26/2021 CLINICAL DATA:  Fall, right hip pain, osteopenia. EXAM: CT OF THE RIGHT HIP WITHOUT CONTRAST TECHNIQUE: Multidetector CT imaging of the right hip was performed according to the standard protocol. Multiplanar CT image reconstructions were also generated. RADIATION DOSE REDUCTION: This exam was performed according to the departmental dose-optimization program which includes automated exposure control, adjustment of the mA and/or kV according to patient size and/or use of iterative reconstruction technique.  COMPARISON:  None. FINDINGS: Bones/Joint/Cartilage The osseous structures are diffusely osteopenic. No acute fracture or dislocation identified. Remote healed fractures of the right inferior pubic ramus and remote nonunited fracture of the right pubic symphysis are identified. A linear lucency is seen within the anterior column of the right hip best appreciated on axial image # 16/4 and along the superior margin of the superior pubic ramus best seen on coronal image # 11/8 which appear corticated and may reflect minimally displaced remote fractures. 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 Left total hip arthroplasty has been performed. Ligaments Suboptimally assessed by CT. Muscles and Tendons Moderate muscular atrophy. Iliopsoas and gluteal tendons appear intact. Degenerative enthesopathy noted at the origin of the hamstring tendon. Soft tissues A lenticular hyperdense collection is seen within the subcutaneous soft tissues of the right hip lateral to the greater trochanter compatible with a large subcutaneous hematoma better appreciated on concurrently performed CT examination of the abdomen and pelvis. There is extensive surrounding interstitial hemorrhage within the visualized right lower extremity. No intramuscular hemorrhage or hematoma identified. IMPRESSION: Marked osteopenia. No acute fracture or dislocation identified. Linear lucency within the anterior column of the right hip likely reflects a remote, healed fracture, however, if there is strong clinical suspicion of acute, nondisplaced fracture, MRI examination would be more sensitive given the degree of osteopenia. Remote fractures of the right pubic symphysis and inferior pubic ramus as described above. Lentiform hematoma within the subcutaneous soft tissues of the right hip with extensive surrounding interstitial hemorrhage within the subcutaneous fat of the visualized right lower extremity. Electronically Signed   By: Fidela Salisbury  M.D.   On: 04/26/2021 20:37   CT L-SPINE NO CHARGE  Result Date: 04/26/2021 CLINICAL DATA:  Right hip pain, fall, stress fracture suspected EXAM: CT LUMBAR SPINE WITHOUT CONTRAST TECHNIQUE: Multidetector CT imaging of the lumbar spine was performed without intravenous contrast administration. Multiplanar CT image reconstructions were also generated. RADIATION DOSE REDUCTION: This exam was performed according to the departmental dose-optimization program which includes automated exposure control, adjustment of the mA and/or kV according to patient size and/or use of iterative reconstruction technique. COMPARISON:  None. FINDINGS: Segmentation: 5 lumbar type vertebrae. Alignment: 2 mm anterolisthesis of L3-4 and L4-5 are likely degenerative in nature. Vertebrae: The osseous structures are diffusely osteopenic. There are insufficiency fractures of T12l (20%), L1 (40-50%), L2 (75%), L4 (30-40%), and L5 (minimal). There is minimal retropulsion of the posterosuperior osteophytic spurs of L1, L2, and L4 with resultant mild central canal stenosis of the a lumbar spine posterior to L2 within AP diameter of 8 mm. No acute fracture. Remote fractures of the sacrum are incidentally noted. Paraspinal and other soft tissues: Paraspinal soft tissues are better visualized on  concurrently performed noncontrast CT examination of the abdomen and pelvis which were described to greater extent in that report. High-grade central canal stenosis is noted at L3-4 secondary to facet arthrosis, hypertrophy ligament flavum, and broad-based disc bulge. Similar findings are noted at L4-5. No definite canal hematoma. Disc levels: Advanced degenerative disc disease is seen throughout the lumbar spine with disc space narrowing, endplate remodeling, and vacuum disc phenomena at L4-5. IMPRESSION: No acute fracture or listhesis of the lumbar spine. Numerous insufficiency fractures, as described above, with minimal retropulsion of L1, L2, and L4  resulting in mild central canal stenosis at L2. Multifactorial high-grade central canal stenosis at L3-4 and L4-5 Remote fractures of the sacrum. Electronically Signed   By: Fidela Salisbury M.D.   On: 04/26/2021 20:31   CT Renal Stone Study  Result Date: 04/26/2021 CLINICAL DATA:  Fall, flank pain, nephrolithiasis EXAM: CT ABDOMEN AND PELVIS WITHOUT CONTRAST TECHNIQUE: Multidetector CT imaging of the abdomen and pelvis was performed following the standard protocol without IV contrast. RADIATION DOSE REDUCTION: This exam was performed according to the departmental dose-optimization program which includes automated exposure control, adjustment of the mA and/or kV according to patient size and/or use of iterative reconstruction technique. COMPARISON:  None. FINDINGS: Lower chest: Small congenital left diaphragmatic hernia. Mild right basilar atelectasis or scar. Multiple radiodensities are seen within the a distal esophagus which appear intramural in nature and may represent ingested tablets within epiphrenic diverticulum or within a ulcer cavity. These were not seen on prior CT examination of 11/08/2018. Hepatobiliary: 4.3 cm simple cyst within the inferior right hepatic lobe. Liver otherwise unremarkable. Cholelithiasis without pericholecystic inflammatory change. No intra or extrahepatic biliary ductal dilation. Pancreas: Unremarkable Spleen: Unremarkable Adrenals/Urinary Tract: Adrenal glands are unremarkable. Kidneys are normal, without renal calculi, focal lesion, or hydronephrosis. Bladder is distended but is otherwise unremarkable. Stomach/Bowel: Moderate stool throughout the colon without evidence of obstruction. Moderate sigmoid diverticulosis without superimposed acute inflammatory change. Stomach, small bowel, and large bowel are otherwise unremarkable. Appendix normal. No free intraperitoneal gas or fluid. Vascular/Lymphatic: Moderate aortoiliac atherosclerotic calcification. No aortic aneurysm. No  pathologic adenopathy within the abdomen and pelvis. Reproductive: A 32 mm simple appearing cyst is seen within the right ovary. The pelvic organs are otherwise unremarkable. Other: A large elliptical hyperdense collection is seen within the subcutaneous soft tissues lateral to the greater trochanter of the right hip compatible with a subcutaneous hematoma measuring at least 6.4 x 9.6 x 16.3 cm in greatest dimension. There is interstitial hemorrhage surrounding the dominant lenticular collection within the subcutaneous fat of the right upper extremity and right flank. No active extravasation identified. Musculoskeletal: Remote fractures of the right pubic symphysis and inferior pubic ramus. There are insufficiency fractures of T12, L1, L2, L4, and L5, most severel at L2 with 60-70% loss of height. There is minimal retropulsion of osteophytic spurring with resultant mild central canal stenosis at L2. Milder, similar changes are noted at T12, L1, and L4. These fractures appear remote in nature. Superimposed advanced degenerative changes seen throughout the lumbar spine. Left total hip arthroplasty has been performed. No acute bone abnormality. IMPRESSION: Minimum 16.3 cm lentiform hematoma within the subcutaneous soft tissues lateral to the right hip. No active extravasation. Extensive surrounding interstitial hemorrhage or edema within the subcutaneous fat of the right flank and visualized right lower extremity. No acute fracture or dislocation. Numerous insufficiency fractures throughout the visualized thoracolumbar spine with minimal retropulsion of osteophytic spurs resulting in mild central canal stenosis at L2. Rounded  hyperdense foreign bodies within the distal esophagus which may reflect ingested tablets within an epiphrenic diverticulum or within a ulcer crater given its intramural appearance. This could be better assessed with fluoroscopic esophagram or endoscopy once the patient's acute issues have  resolved. Cholelithiasis. Moderate stool throughout the colon without evidence of obstruction. Moderate distal colonic diverticulosis without superimposed acute inflammatory change. 32 mm right adnexal simple cyst Recommend follow-up US in 6-12 months. Note: This recommendation does not apply to premenarchal patients and to those with increased risk (genetic, family history, elevated tumor markers or other high-risk factors) of ovarian cancer. Reference: JACR 2020 Feb; 17(2):248-254 Aortic Atherosclerosis (ICD10-I70.0). Electronically Signed   By: Fidela Salisbury M.D.   On: 04/26/2021 20:22   DG Hip Unilat W or Wo Pelvis 2-3 Views Right  Result Date: 04/26/2021 CLINICAL DATA:  Right hip pain following a fall from a syncopal episode today. EXAM: DG HIP (WITH OR WITHOUT PELVIS) 2-3V RIGHT COMPARISON:  None. FINDINGS: There is insufficient internal rotation of the leg on the frontal views of the hip with no gross fracture or dislocation seen. There is also incomplete visualization of the right femoral head and neck on the lateral views due to overlapping of a left hip prosthesis. There is some bony irregularity at the medial aspect of the right inferior pubic ramus without visible acute fracture lines. IMPRESSION: 1. Incomplete visualization of the right hip due to limitations described above. There is a continued clinical concern for a possible fracture, a right hip CT without contrast would be recommended. 2. Probable old, healed fracture of the medial aspect of the right inferior pubic ramus at the junction with the pubic body. An acute fracture is less likely but not excluded. This could also be evaluated at the time of CT. Electronically Signed   By: Claudie Revering M.D.   On: 04/26/2021 14:08    Microbiology: Results for orders placed or performed during the hospital encounter of 04/26/21  Resp Panel by RT-PCR (Flu A&B, Covid) Nasopharyngeal Swab     Status: None   Collection Time: 04/26/21  9:05 PM    Specimen: Nasopharyngeal Swab; Nasopharyngeal(NP) swabs in vial transport medium  Result Value Ref Range Status   SARS Coronavirus 2 by RT PCR NEGATIVE NEGATIVE Final    Comment: (NOTE) SARS-CoV-2 target nucleic acids are NOT DETECTED.  The SARS-CoV-2 RNA is generally detectable in upper respiratory specimens during the acute phase of infection. The lowest concentration of SARS-CoV-2 viral copies this assay can detect is 138 copies/mL. A negative result does not preclude SARS-Cov-2 infection and should not be used as the sole basis for treatment or other patient management decisions. A negative result may occur with  improper specimen collection/handling, submission of specimen other than nasopharyngeal swab, presence of viral mutation(s) within the areas targeted by this assay, and inadequate number of viral copies(<138 copies/mL). A negative result must be combined with clinical observations, patient history, and epidemiological information. The expected result is Negative.  Fact Sheet for Patients:  EntrepreneurPulse.com.au  Fact Sheet for Healthcare Providers:  IncredibleEmployment.be  This test is no t yet approved or cleared by the [place] FDA and  has been authorized for detection and/or diagnosis of SARS-CoV-2 by FDA under an Emergency Use Authorization (EUA). This EUA will remain  in effect (meaning this test can be used) for the duration of the COVID-19 declaration under Section 564(b)(1) of the Act, 21 U.S.C.section 360bbb-3(b)(1), unless the authorization is terminated  or revoked sooner.  Influenza A by PCR NEGATIVE NEGATIVE Final   Influenza B by PCR NEGATIVE NEGATIVE Final    Comment: (NOTE) The Xpert Xpress SARS-CoV-2/FLU/RSV plus assay is intended as an aid in the diagnosis of influenza from Nasopharyngeal swab specimens and should not be used as a sole basis for treatment. Nasal washings and aspirates are  unacceptable for Xpert Xpress SARS-CoV-2/FLU/RSV testing.  Fact Sheet for Patients: EntrepreneurPulse.com.au  Fact Sheet for Healthcare Providers: IncredibleEmployment.be  This test is not yet approved or cleared by the [place] FDA and has been authorized for detection and/or diagnosis of SARS-CoV-2 by FDA under an Emergency Use Authorization (EUA). This EUA will remain in effect (meaning this test can be used) for the duration of the COVID-19 declaration under Section 564(b)(1) of the Act, 21 U.S.C. section 360bbb-3(b)(1), unless the authorization is terminated or revoked.  Performed at [facility], [place] [address], [place], [place] [address]     Labs: CBC: Recent Labs  Lab 04/26/21 1235 04/26/21 2242 04/27/21 0027 04/27/21 0456 04/28/21 0439 04/28/21 1151 04/29/21 0428  WBC 8.7 10.8* 10.4 9.9 7.6  --  5.4  NEUTROABS 6.3  --   --   --   --   --   --   HGB 7.6* 6.6* 6.5* 7.8* 6.8* 7.9* 7.8*  HCT 22.0* 19.2* 18.8* 22.0* 19.1* 22.4* 22.4*  MCV 83.3 85.0 83.9 84.3 84.1  --  86.5  PLT 253 221 204 187 153  --  [id number]   Basic Metabolic Panel: Recent Labs  Lab 04/26/21 1235 04/27/21 0610  NA 137 135  K 4.0 4.4  CL 107 107  CO2 24 21*  GLUCOSE 138* 98  BUN 26* 29*  CREATININE 0.71 0.59  CALCIUM 8.6* 8.1*   Liver Function Tests: Recent Labs  Lab 04/26/21 1235  AST 24  ALT 17  ALKPHOS 66  BILITOT 0.8  PROT 6.1*  ALBUMIN 3.1*   CBG: Recent Labs  Lab 04/28/21 1203 04/28/21 1811 04/28/21 2357 04/29/21 0610 04/29/21 1209  GLUCAP 91 89 101* 81 132*    Discharge time spent: 35 minutes.  Signed: Edwin Dada, MD [facility] 04/29/2021

## 2021-04-29 NOTE — Progress Notes (Signed)
Patient ID: Connie Snyder, female   DOB: [birth year], 88 y.o.   MRN: [id number] Awake and alert sitting in bedside chair.  Follows commands appropriately.  Right proximal thigh hematoma unchanged.  Not expanding and not firm.  She is able to mover her right leg/ankle foot.  No evidence of compartment syndrome.  This will take a very long time to resolve, but it is stable and no surgery is indicated.  She can be followed as an outpatient at this standpoint.  Can reach out with questions/concerns.

## 2021-04-29 NOTE — TOC Transition Note (Signed)
Transition of Care [facility]) - CM/SW Discharge Note   Patient Details  Name: Connie Snyder MRN: [id number] Date of Birth: [birth year]  [facility] of [facility]) CM/SW Contact:  Lanier Clam, RN Phone Number: 04/29/2021, 10:28 AM   Clinical Narrative: d/c home [facility] rep [facility] aware of HHPT/OT. No further CM needs.      Final next level of care: Home w Home Health Services Barriers to Discharge: No Barriers Identified   Patient Goals and CMS Choice Patient states their goals for this hospitalization and ongoing recovery are:: home CMS Medicare.gov Compare Post Acute Care list provided to:: Patient Represenative (must comment) [name] son) Choice offered to / list presented to : Adult Children  Discharge Placement                       Discharge Plan and Services   Discharge Planning Services: CM Consult Post Acute Care Choice: Home Health                    HH Arranged: PT, OT [facility] Agency: [facility] Date [facility] Agency Contacted: 04/27/21 Time HH Agency Contacted: 1357 Representative spoke with at [facility] Agency: Kandee Keen  Social Determinants of Health (SDOH) Interventions     Readmission Risk Interventions No flowsheet data found.

## 2021-05-12 ENCOUNTER — Encounter: Payer: Self-pay | Admitting: Orthopaedic Surgery

## 2021-05-12 ENCOUNTER — Other Ambulatory Visit: Payer: Self-pay

## 2021-05-12 ENCOUNTER — Ambulatory Visit (INDEPENDENT_AMBULATORY_CARE_PROVIDER_SITE_OTHER): Payer: Medicare Other | Admitting: Orthopaedic Surgery

## 2021-05-12 VITALS — Ht 64.0 in | Wt 119.0 lb

## 2021-05-12 DIAGNOSIS — S7011XD Contusion of right thigh, subsequent encounter: Secondary | ICD-10-CM

## 2021-05-12 NOTE — Progress Notes (Signed)
The patient is a 88 year old that I am seeing today after being seen in the hospital for right hip/proximal thigh hematoma.  She is on blood thinning medication and had a mechanical fall.  She had minimal fractures in her pelvis and developed a large hematoma.  They took her off of blood thinning medications.  I saw her for multiple days in the hospital and the hematoma was stable and not expanding.  She comes in today saying that she is not having any issues with that hip other than some slight pain and she feels like it is decreased in size as does her son.  She was on blood thinning medications because of a history of a pulmonary embolus.  Both her feet have been swelling.  On examination of the right hip hematoma I did try to aspirate any fluid off of this area but it is too thick from just being a hematoma.  There was no complicating features of this and it does look smaller to me than when she was in the hospital.  From my standpoint I recommended intermittent ice and mainly a warm compress or heating pad occasionally not directly on the skin that could help this slowly reduce with time.  It can take 6 months to a year for this to reduce in size.  I would not recommend any type of surgery on this unless it was life-threatening which it does not.  From my standpoint she can also resume blood thinning medications if this is warranted from her primary care physician standpoint of things.  I will see her back in 3 months just for repeat exam.

## 2021-06-02 ENCOUNTER — Emergency Department (HOSPITAL_COMMUNITY): Payer: Medicare Other

## 2021-06-02 ENCOUNTER — Inpatient Hospital Stay (HOSPITAL_COMMUNITY)
Admission: EM | Admit: 2021-06-02 | Discharge: 2021-06-06 | DRG: 178 | Disposition: A | Discharge status: Home / Self Care | Payer: Medicare Other | Attending: Internal Medicine | Admitting: Internal Medicine

## 2021-06-02 DIAGNOSIS — I517 Cardiomegaly: Secondary | ICD-10-CM | POA: Diagnosis not present

## 2021-06-02 DIAGNOSIS — M069 Rheumatoid arthritis, unspecified: Secondary | ICD-10-CM | POA: Diagnosis present

## 2021-06-02 DIAGNOSIS — Z79899 Other long term (current) drug therapy: Secondary | ICD-10-CM

## 2021-06-02 DIAGNOSIS — R55 Syncope and collapse: Secondary | ICD-10-CM | POA: Diagnosis not present

## 2021-06-02 DIAGNOSIS — I700 Atherosclerosis of aorta: Secondary | ICD-10-CM | POA: Diagnosis present

## 2021-06-02 DIAGNOSIS — J984 Other disorders of lung: Secondary | ICD-10-CM | POA: Diagnosis not present

## 2021-06-02 DIAGNOSIS — Z7982 Long term (current) use of aspirin: Secondary | ICD-10-CM | POA: Diagnosis not present

## 2021-06-02 DIAGNOSIS — M3500 Sicca syndrome, unspecified: Secondary | ICD-10-CM | POA: Diagnosis not present

## 2021-06-02 DIAGNOSIS — R1110 Vomiting, unspecified: Secondary | ICD-10-CM | POA: Diagnosis not present

## 2021-06-02 DIAGNOSIS — M25551 Pain in right hip: Secondary | ICD-10-CM | POA: Diagnosis not present

## 2021-06-02 DIAGNOSIS — F0150 Vascular dementia without behavioral disturbance: Secondary | ICD-10-CM | POA: Diagnosis present

## 2021-06-02 DIAGNOSIS — J9811 Atelectasis: Secondary | ICD-10-CM | POA: Diagnosis not present

## 2021-06-02 DIAGNOSIS — S22060A Wedge compression fracture of T7-T8 vertebra, initial encounter for closed fracture: Secondary | ICD-10-CM | POA: Diagnosis not present

## 2021-06-02 DIAGNOSIS — Z20822 Contact with and (suspected) exposure to covid-19: Secondary | ICD-10-CM | POA: Diagnosis present

## 2021-06-02 DIAGNOSIS — Z86711 Personal history of pulmonary embolism: Secondary | ICD-10-CM

## 2021-06-02 DIAGNOSIS — Z96642 Presence of left artificial hip joint: Secondary | ICD-10-CM | POA: Diagnosis present

## 2021-06-02 DIAGNOSIS — M7989 Other specified soft tissue disorders: Secondary | ICD-10-CM | POA: Diagnosis not present

## 2021-06-02 DIAGNOSIS — F01511 Vascular dementia, unspecified severity, with agitation: Secondary | ICD-10-CM | POA: Diagnosis present

## 2021-06-02 DIAGNOSIS — S2222XA Fracture of body of sternum, initial encounter for closed fracture: Secondary | ICD-10-CM | POA: Diagnosis not present

## 2021-06-02 DIAGNOSIS — Z888 Allergy status to other drugs, medicaments and biological substances status: Secondary | ICD-10-CM

## 2021-06-02 DIAGNOSIS — J690 Pneumonitis due to inhalation of food and vomit: Principal | ICD-10-CM | POA: Diagnosis present

## 2021-06-02 DIAGNOSIS — S22050A Wedge compression fracture of T5-T6 vertebra, initial encounter for closed fracture: Secondary | ICD-10-CM | POA: Diagnosis not present

## 2021-06-02 DIAGNOSIS — R079 Chest pain, unspecified: Secondary | ICD-10-CM | POA: Diagnosis present

## 2021-06-02 DIAGNOSIS — R609 Edema, unspecified: Secondary | ICD-10-CM | POA: Diagnosis not present

## 2021-06-02 DIAGNOSIS — K760 Fatty (change of) liver, not elsewhere classified: Secondary | ICD-10-CM | POA: Diagnosis present

## 2021-06-02 DIAGNOSIS — D638 Anemia in other chronic diseases classified elsewhere: Secondary | ICD-10-CM | POA: Diagnosis present

## 2021-06-02 DIAGNOSIS — Z9181 History of falling: Secondary | ICD-10-CM

## 2021-06-02 DIAGNOSIS — Z87891 Personal history of nicotine dependence: Secondary | ICD-10-CM | POA: Diagnosis not present

## 2021-06-02 LAB — CBC
HCT: 34 % — ABNORMAL LOW (ref 36.0–46.0)
Hemoglobin: 11.9 g/dL — ABNORMAL LOW (ref 12.0–15.0)
MCH: 30.4 pg (ref 26.0–34.0)
MCHC: 35 g/dL (ref 30.0–36.0)
MCV: 86.7 fL (ref 80.0–100.0)
Platelets: 348 10*3/uL (ref 150–400)
RBC: 3.92 MIL/uL (ref 3.87–5.11)
RDW: 14.5 % (ref 11.5–15.5)
WBC: 13.6 10*3/uL — ABNORMAL HIGH (ref 4.0–10.5)
nRBC: 0 % (ref 0.0–0.2)

## 2021-06-02 LAB — CBG MONITORING, ED: Glucose-Capillary: 98 mg/dL (ref 70–99)

## 2021-06-02 MED ORDER — LORAZEPAM 2 MG/ML IJ SOLN
0.5000 mg | Freq: Once | INTRAMUSCULAR | Status: AC
  Administered 2021-06-02: 0.5 mg via INTRAVENOUS
  Filled 2021-06-02: qty 1

## 2021-06-02 NOTE — ED Provider Notes (Signed)
?[facility]Y DEPT ?[facility] Emergency Department ?Provider Note ?MRN:  [id number]  ?Arrival date & time: 06/03/21    ? ?Chief Complaint   ?Loss of Consciousness ?  ?History of Present Illness   ?Connie Snyder is a 88 y.o. year-old female presents to the ED with chief complaint of syncope, chest pain, vomiting.  Patient is not sure why she is here. ? ?History provided by Additional independent history provided by family member (son [name]), who states that while at dinner tonight the patient began complaining of chest pain and proceeded to vomit and then pass out.  She did not fall from the chair.  She was unconscious for about 5 minutes.  Son has never seen this in her before.  She has history of dementia. ? ?Review of Systems  ?Pertinent review of systems noted in HPI.  ? ? ?Physical Exam  ? ?Vitals:  ? 06/03/21 0430 06/03/21 0445  ?BP: (!) 119/57 (!) 110/59  ?Pulse: 61 66  ?Resp: 15 15  ?Temp:    ?SpO2: 96% 97%  ?  ?CONSTITUTIONAL:  well-appearing, NAD ?NEURO:  Alert and oriented x 3, CN 3-12 grossly intact ?EYES:  eyes equal and reactive ?ENT/NECK:  Supple, no stridor  ?CARDIO:  normal rate, regular rhythm, appears well-perfused  ?PULM:  No respiratory distress, CTAB ?GI/GU:  non-distended,  ?MSK/SPINE:  No gross deformities, no edema, moves all extremities  ?SKIN:  no rash, atraumatic ? ? ?*Additional and/or pertinent findings included in MDM below ? ?Diagnostic and Interventional Summary  ? ? EKG Interpretation ? ?Date/Time:  Thursday June 03 2021 04:22:04 EST ?Ventricular Rate:  64 ?PR Interval:  177 ?QRS Duration: 117 ?QT Interval:  439 ?QTC Calculation: 453 ?R Axis:   -17 ?Text Interpretation: Sinus rhythm Anterior Q waves, possibly due to LVH Confirmed by Nicanor Alcon, April ([id number]) on 06/03/2021 4:30:19 AM ?  ? ?  ? ?Labs Reviewed  ?BASIC METABOLIC PANEL - Abnormal; Notable for the following components:  ?    Result Value  ? Glucose, Bld 102 (*)   ? All other components within normal limits  ?CBC -  Abnormal; Notable for the following components:  ? WBC 13.6 (*)   ? Hemoglobin 11.9 (*)   ? HCT 34.0 (*)   ? All other components within normal limits  ?RESP PANEL BY RT-PCR (FLU A&B, COVID) [id number]  ?URINALYSIS, ROUTINE W REFLEX MICROSCOPIC  ?CBG MONITORING, ED  ?TROPONIN I (HIGH SENSITIVITY)  ?TROPONIN I (HIGH SENSITIVITY)  ?  ?CT HEAD WO CONTRAST ( )  ?Final Result  ?  ?CT Chest W Contrast  ?Final Result  ?  ?DG Chest 2 View  ?Final Result  ?  ?  ?Medications  ?Ampicillin-Sulbactam (UNASYN) 3 g in sodium chloride 0.9 % 100 mL IVPB (3 g Intravenous New Bag/Given 06/03/21 0532)  ?LORazepam (ATIVAN) injection 0.5 mg (0.5 mg Intravenous Given 06/02/21 2319)  ?iohexol (OMNIPAQUE) 300 MG/ML solution 75 mL (75 mLs Intravenous Contrast Given 06/03/21 0144)  ?  ? ?Procedures  /  Critical Care ?Procedures ? ?ED Course and Medical Decision Making  ?I have reviewed the triage vital signs, the nursing notes, and pertinent available records from the EMR. ? ?Complexity of Problems Addressed: ?High Complexity: Acute illness/injury posing a threat to life or bodily function, requiring emergent diagnostic workup, evaluation, and treatment as below. ? ?ED Course: ?After considering the following differential, syncope and chest pain, I ordered labs and imaging. ?I personally interpreted the labs which are notable for leukocytosis to 13.6, normal troponins,  no significant electrolyte derrangement ?I visualized the chest x-ray which is notable for no obvious infiltrate and agree with the radiologist interpretation. ?I observed the patient while on the cardiac monitor and noted no significant dysrhythmias.. ? ?  ? ?Social Determinants Affecting Care: ? ? ? ? ?Consultants: ?I discussed the case with Hospitalist, Dr. Arville CareMansy, who is appreciated for admitting. ? ?Treatment and Plan: ?Plan for observation admission due to syncope.  Potential aspiration pneumonia. ? ?Patient's exam and diagnostic results are concerning for syncope.  Feel that  patient will need admission to the hospital for further treatment and evaluation. ? ? ? ?Final Clinical Impressions(s) / ED Diagnoses  ? ?  ICD-10-CM   ?1. Syncope, unspecified syncope type  R55   ?  ?  ?ED Discharge Orders   ? ? None  ? ?  ?  ? ? ?Discharge Instructions Discussed with and Provided to Patient:  ? ?Discharge Instructions   ?None ?  ? ?  ?Roxy HorsemanBrowning, Deya Bigos, PA-C ?06/03/21 0542 ? ?  ?Palumbo, April, MD ?06/03/21 [id number] ? ?

## 2021-06-02 NOTE — ED Triage Notes (Signed)
BIB [facility]. EMS reports patient was eating, has episode of emesis, LOC. Did not fall, hit head. A+O with EMS. Hx afib. ? ?Patient present to this RN with right shoulder pain. Report from patient is unclear.  ?

## 2021-06-03 ENCOUNTER — Encounter (HOSPITAL_COMMUNITY): Payer: Self-pay | Admitting: Family Medicine

## 2021-06-03 ENCOUNTER — Inpatient Hospital Stay (HOSPITAL_COMMUNITY): Payer: Medicare Other

## 2021-06-03 ENCOUNTER — Emergency Department (HOSPITAL_COMMUNITY): Payer: Medicare Other

## 2021-06-03 DIAGNOSIS — R609 Edema, unspecified: Secondary | ICD-10-CM

## 2021-06-03 DIAGNOSIS — Z20822 Contact with and (suspected) exposure to covid-19: Secondary | ICD-10-CM | POA: Diagnosis present

## 2021-06-03 DIAGNOSIS — F01511 Vascular dementia, unspecified severity, with agitation: Secondary | ICD-10-CM | POA: Diagnosis present

## 2021-06-03 DIAGNOSIS — R079 Chest pain, unspecified: Secondary | ICD-10-CM | POA: Diagnosis not present

## 2021-06-03 DIAGNOSIS — D638 Anemia in other chronic diseases classified elsewhere: Secondary | ICD-10-CM | POA: Diagnosis present

## 2021-06-03 DIAGNOSIS — Z86711 Personal history of pulmonary embolism: Secondary | ICD-10-CM | POA: Diagnosis not present

## 2021-06-03 DIAGNOSIS — J690 Pneumonitis due to inhalation of food and vomit: Secondary | ICD-10-CM | POA: Diagnosis not present

## 2021-06-03 DIAGNOSIS — Z888 Allergy status to other drugs, medicaments and biological substances status: Secondary | ICD-10-CM | POA: Diagnosis not present

## 2021-06-03 DIAGNOSIS — M069 Rheumatoid arthritis, unspecified: Secondary | ICD-10-CM | POA: Diagnosis present

## 2021-06-03 DIAGNOSIS — R55 Syncope and collapse: Secondary | ICD-10-CM | POA: Diagnosis not present

## 2021-06-03 DIAGNOSIS — K760 Fatty (change of) liver, not elsewhere classified: Secondary | ICD-10-CM | POA: Diagnosis present

## 2021-06-03 DIAGNOSIS — M7989 Other specified soft tissue disorders: Secondary | ICD-10-CM

## 2021-06-03 DIAGNOSIS — I700 Atherosclerosis of aorta: Secondary | ICD-10-CM | POA: Diagnosis present

## 2021-06-03 DIAGNOSIS — Z9181 History of falling: Secondary | ICD-10-CM | POA: Diagnosis not present

## 2021-06-03 DIAGNOSIS — Z87891 Personal history of nicotine dependence: Secondary | ICD-10-CM | POA: Diagnosis not present

## 2021-06-03 DIAGNOSIS — Z96642 Presence of left artificial hip joint: Secondary | ICD-10-CM | POA: Diagnosis present

## 2021-06-03 DIAGNOSIS — Z79899 Other long term (current) drug therapy: Secondary | ICD-10-CM | POA: Diagnosis not present

## 2021-06-03 DIAGNOSIS — M3500 Sicca syndrome, unspecified: Secondary | ICD-10-CM | POA: Diagnosis present

## 2021-06-03 DIAGNOSIS — Z7982 Long term (current) use of aspirin: Secondary | ICD-10-CM | POA: Diagnosis not present

## 2021-06-03 LAB — RESP PANEL BY RT-PCR (FLU A&B, COVID) ARPGX2
Influenza A by PCR: NEGATIVE
Influenza B by PCR: NEGATIVE
SARS Coronavirus 2 by RT PCR: NEGATIVE

## 2021-06-03 LAB — URINALYSIS, ROUTINE W REFLEX MICROSCOPIC
Bilirubin Urine: NEGATIVE
Glucose, UA: NEGATIVE mg/dL
Hgb urine dipstick: NEGATIVE
Ketones, ur: NEGATIVE mg/dL
Leukocytes,Ua: NEGATIVE
Nitrite: NEGATIVE
Protein, ur: NEGATIVE mg/dL
Specific Gravity, Urine: <1.005 — ABNORMAL LOW (ref 1.005–1.030)
pH: 7.5 (ref 5.0–8.0)

## 2021-06-03 LAB — ECHOCARDIOGRAM COMPLETE
AR max vel: 2.45 cm2
AV Area VTI: 2.56 cm2
AV Area mean vel: 2.44 cm2
AV Mean grad: 13 mmHg
AV Peak grad: 23.5 mmHg
Ao pk vel: 2.43 m/s
Area-P 1/2: 2.91 cm2
Height: 64 in
MV VTI: 3.03 cm2
S' Lateral: 1.6 cm
Weight: 1904.77 oz

## 2021-06-03 LAB — TROPONIN I (HIGH SENSITIVITY)
Troponin I (High Sensitivity): 8 ng/L (ref <18)
Troponin I (High Sensitivity): 3 ng/L (ref <18)

## 2021-06-03 LAB — BASIC METABOLIC PANEL
Anion gap: 11 (ref 5–15)
BUN: 17 mg/dL (ref 8–23)
CO2: 22 mmol/L (ref 22–32)
Calcium: 9.2 mg/dL (ref 8.9–10.3)
Chloride: 102 mmol/L (ref 98–111)
Creatinine, Ser: 0.55 mg/dL (ref 0.44–1.00)
GFR, Estimated: >60 mL/min (ref >60)
Glucose, Bld: 102 mg/dL — ABNORMAL HIGH (ref 70–99)
Potassium: 4.1 mmol/L (ref 3.5–5.1)
Sodium: 135 mmol/L (ref 135–145)

## 2021-06-03 LAB — D-DIMER, QUANTITATIVE: D-Dimer, Quant: 10.26 ug/mL-FEU — ABNORMAL HIGH (ref 0.00–0.50)

## 2021-06-03 MED ORDER — ASPIRIN EC 81 MG PO TBEC
162.0000 mg | DELAYED_RELEASE_TABLET | Freq: Every day | ORAL | Status: DC
  Administered 2021-06-03 – 2021-06-06 (×4): 162 mg via ORAL
  Filled 2021-06-03 (×4): qty 2

## 2021-06-03 MED ORDER — VITAMIN D 25 MCG (1000 UNIT) PO TABS
1000.0000 [IU] | ORAL_TABLET | Freq: Every day | ORAL | Status: DC
  Administered 2021-06-03 – 2021-06-06 (×4): 1000 [IU] via ORAL
  Filled 2021-06-03 (×4): qty 1

## 2021-06-03 MED ORDER — ADULT MULTIVITAMIN W/MINERALS CH
1.0000 | ORAL_TABLET | Freq: Every day | ORAL | Status: DC
  Administered 2021-06-03 – 2021-06-06 (×4): 1 via ORAL
  Filled 2021-06-03 (×4): qty 1

## 2021-06-03 MED ORDER — FERROUS SULFATE 325 (65 FE) MG PO TABS
325.0000 mg | ORAL_TABLET | Freq: Two times a day (BID) | ORAL | Status: DC
Start: 2021-06-03 — End: 2021-06-06
  Administered 2021-06-03 – 2021-06-06 (×6): 325 mg via ORAL
  Filled 2021-06-03 (×7): qty 1

## 2021-06-03 MED ORDER — IOHEXOL 350 MG/ML SOLN
100.0000 mL | Freq: Once | INTRAVENOUS | Status: AC | PRN
  Administered 2021-06-03: 100 mL via INTRAVENOUS

## 2021-06-03 MED ORDER — BOOST HIGH PROTEIN PO LIQD
1.0000 | Freq: Two times a day (BID) | ORAL | Status: DC
  Administered 2021-06-04 – 2021-06-06 (×6): 237 mL via ORAL
  Filled 2021-06-03 (×7): qty 237

## 2021-06-03 MED ORDER — SODIUM CHLORIDE 0.9 % IV SOLN
3.0000 g | Freq: Four times a day (QID) | INTRAVENOUS | Status: DC
  Administered 2021-06-03 – 2021-06-06 (×11): 3 g via INTRAVENOUS
  Filled 2021-06-03 (×14): qty 8

## 2021-06-03 MED ORDER — HYDROXYZINE HCL 25 MG PO TABS
25.0000 mg | ORAL_TABLET | Freq: Two times a day (BID) | ORAL | Status: DC
  Administered 2021-06-03 – 2021-06-06 (×6): 25 mg via ORAL
  Filled 2021-06-03 (×7): qty 1

## 2021-06-03 MED ORDER — IPRATROPIUM-ALBUTEROL 0.5-2.5 (3) MG/3ML IN SOLN
3.0000 mL | RESPIRATORY_TRACT | Status: DC | PRN
Start: 2021-06-03 — End: 2021-06-06

## 2021-06-03 MED ORDER — MIRTAZAPINE 30 MG PO TABS
30.0000 mg | ORAL_TABLET | Freq: Every day | ORAL | Status: DC
  Administered 2021-06-03 – 2021-06-05 (×2): 30 mg via ORAL
  Filled 2021-06-03 (×4): qty 1

## 2021-06-03 MED ORDER — DOCUSATE SODIUM 100 MG PO CAPS
100.0000 mg | ORAL_CAPSULE | Freq: Two times a day (BID) | ORAL | Status: DC
  Administered 2021-06-03 – 2021-06-05 (×4): 100 mg via ORAL
  Filled 2021-06-03 (×5): qty 1

## 2021-06-03 MED ORDER — ONDANSETRON HCL 4 MG PO TABS: 4.0000 mg | ORAL_TABLET | Freq: Four times a day (QID) | ORAL | Status: DC | PRN

## 2021-06-03 MED ORDER — ACETAMINOPHEN 650 MG RE SUPP: 650.0000 mg | Freq: Four times a day (QID) | RECTAL | Status: DC | PRN

## 2021-06-03 MED ORDER — AMPICILLIN-SULBACTAM SODIUM 3 (2-1) G IJ SOLR: 3.0000 g | Freq: Four times a day (QID) | INTRAMUSCULAR | Status: DC

## 2021-06-03 MED ORDER — ACETAMINOPHEN 325 MG PO TABS
650.0000 mg | ORAL_TABLET | Freq: Four times a day (QID) | ORAL | Status: DC | PRN
  Administered 2021-06-05 – 2021-06-06 (×2): 650 mg via ORAL
  Filled 2021-06-03 (×2): qty 2

## 2021-06-03 MED ORDER — SODIUM CHLORIDE 0.9 % IV SOLN
500.0000 mg | INTRAVENOUS | Status: DC
  Administered 2021-06-03 – 2021-06-06 (×4): 500 mg via INTRAVENOUS
  Filled 2021-06-03 (×4): qty 5

## 2021-06-03 MED ORDER — GUAIFENESIN ER 600 MG PO TB12
600.0000 mg | ORAL_TABLET | Freq: Two times a day (BID) | ORAL | Status: DC
  Administered 2021-06-03 – 2021-06-06 (×5): 600 mg via ORAL
  Filled 2021-06-03 (×7): qty 1

## 2021-06-03 MED ORDER — IOHEXOL 300 MG/ML  SOLN
75.0000 mL | Freq: Once | INTRAMUSCULAR | Status: AC | PRN
  Administered 2021-06-03: 75 mL via INTRAVENOUS

## 2021-06-03 MED ORDER — ENOXAPARIN SODIUM 30 MG/0.3ML IJ SOSY: 30.0000 mg | PREFILLED_SYRINGE | INTRAMUSCULAR | Status: DC

## 2021-06-03 MED ORDER — MAGNESIUM HYDROXIDE 400 MG/5ML PO SUSP: 30.0000 mL | Freq: Every day | ORAL | Status: DC | PRN

## 2021-06-03 MED ORDER — ONDANSETRON HCL 4 MG/2ML IJ SOLN: 4.0000 mg | Freq: Four times a day (QID) | INTRAMUSCULAR | Status: DC | PRN

## 2021-06-03 MED ORDER — SODIUM CHLORIDE 0.9 % IV SOLN: INTRAVENOUS | Status: DC

## 2021-06-03 MED ORDER — SODIUM CHLORIDE 0.9 % IV SOLN
3.0000 g | Freq: Four times a day (QID) | INTRAVENOUS | Status: DC
  Administered 2021-06-03 (×2): 3 g via INTRAVENOUS
  Filled 2021-06-03 (×5): qty 8

## 2021-06-03 MED ORDER — SODIUM CHLORIDE 0.9 % IV SOLN: 2.0000 g | INTRAVENOUS | Status: DC

## 2021-06-03 MED ORDER — ZOLPIDEM TARTRATE 5 MG PO TABS: 5.0000 mg | ORAL_TABLET | Freq: Every evening | ORAL | Status: DC | PRN

## 2021-06-03 NOTE — ED Notes (Signed)
Help get patient changed and place a per wick and a brief got patient some warm blankets patient is resting with call bell in reach ?

## 2021-06-03 NOTE — Assessment & Plan Note (Signed)
-   We will place on IV Unasyn and Zithromax. ?- Mucolytic therapy and bronchodilator therapy will be provided. ?- We will follow blood cultures. ?

## 2021-06-03 NOTE — Progress Notes (Signed)
Echocardiogram ?2D Echocardiogram has been performed. ? ?Connie Snyder  Connie Snyder ?06/03/2021, 8:50 AM ?

## 2021-06-03 NOTE — ED Notes (Addendum)
Son called for update.

## 2021-06-03 NOTE — ED Notes (Signed)
Patient returned back from CT. 

## 2021-06-03 NOTE — H&P (Signed)
[place]   PATIENT NAME: Connie Snyder    MR#:  [id number]  DATE OF BIRTH:  [birth year]  DATE OF ADMISSION:  06/02/2021  PRIMARY CARE PHYSICIAN: Georgianne Fick, MD   Patient is coming from: Home  REQUESTING/REFERRING PHYSICIAN: Roxy Horseman, PA-C  CHIEF COMPLAINT:   Chief Complaint  Patient presents with   Loss of Consciousness    HISTORY OF PRESENT ILLNESS:  Connie Snyder is a 88 y.o. African-American female with medical history significant for Sjogren's syndrome, PE, nonalcoholic fatty liver disease, vascular dementia without behavioral changes, rheumatoid arthritis and chronic anemia, presented to emergency room with acute onset of syncope last evening.  The patient was apparently having midsternal chest pain with associated vomiting before she passed out.  She remained unconscious for about 5 minutes.  She was later noted to be coughing and admitted to the expectoration of sputum.  No significant dyspnea or wheezing.  No reported dysuria, oliguria or hematuria, urgency or frequency or flank pain.  No reported fever or chills.  The patient is very poor historian being demented and her son is giving most of the history earlier.  ED Course: When she came to the ER BP was 134/100 and later 110/87 with otherwise normal vital signs.  Labs revealed.  She has great showed sliding hypertensive troponin I of 8 and later 3, CBC you with leukocytosis 13.6 and hemoglobin of 11.9 hematocrit 34 much better than previous values with unremarkable BMP. EKG as reviewed by me : EKG showed normal sinus rhythm with a rate of 64 with Q waves anteroseptally. Imaging: 2 view chest ray showed fullness in the right hilum and rightward shift shift of the trachea which is no from previous exam with recommendation for CT scan.  It showed cardiomegaly.  Chest CT with contrast revealed no nodules or masses.  It showed cyst trendy atelectasis in the lungs bilaterally with mild airspace disease in  the right lung base, possible atelectasis or infiltrate.  It showed aortic atherosclerosis and multiple compression deformities in the thoracolumbar spine which are indeterminate in age.  Noncontrast head CT scan revealed atrophy with chronic microvascular ischemic changes with no acute intracranial normalities.   The patient was given IV Unasyn and 0.5 mg of IV Ativan.  She will be admitted to a medical telemetry bed for further evaluation and management.   PAST MEDICAL HISTORY:   Past Medical History:  Diagnosis Date   Arthritis    Sjogren's disease (HCC)   PE, nonalcoholic fatty liver disease, vascular dementia without behavioral changes, rheumatoid arthritis and chronic anemia.  PAST SURGICAL HISTORY:   Past Surgical History:  Procedure Laterality Date   TOTAL HIP ARTHROPLASTY Left 12/22/2017   Procedure: TOTAL HIP ARTHROPLASTY ANTERIOR APPROACH;  Surgeon: Tarry Kos, MD;  Location: [facility] OR;  Service: Orthopedics;  Laterality: Left;    SOCIAL HISTORY:   Social History   Tobacco Use   Smoking status: Former   Smokeless tobacco: Never  Substance Use Topics   Alcohol use: Yes    Comment: weekly    FAMILY HISTORY:   Family History  Problem Relation Age of Onset   Pulmonary embolism Other     DRUG ALLERGIES:   Allergies  Allergen Reactions   Aricept [Donepezil] Nausea And Vomiting    REVIEW OF SYSTEMS:   ROS As per history of present illness. All pertinent systems were reviewed above. Constitutional, HEENT, cardiovascular, respiratory, GI, GU, musculoskeletal, neuro, psychiatric, endocrine, integumentary and hematologic systems  were reviewed and are otherwise negative/unremarkable except for positive findings mentioned above in the HPI.   MEDICATIONS AT HOME:   Prior to Admission medications   Medication Sig Start Date End Date Taking? Authorizing Provider  acetaminophen (TYLENOL) 650 MG CR tablet Take 1,300 mg by mouth in the morning and at bedtime.   Yes  [provider]  ASPIRIN LOW DOSE 81 MG EC tablet Take 162 mg by mouth daily. 04/02/21  Yes [provider]  celecoxib (CELEBREX) 100 MG capsule Take 100 mg by mouth 2 (two) times daily as needed for mild pain.   Yes [provider]  Cholecalciferol (VITAMIN D-3) 25 MCG (1000 UT) CAPS Take 1,000 Units by mouth daily.   Yes [provider]  docusate sodium (COLACE) 100 MG capsule Take 100 mg by mouth 2 (two) times daily.   Yes [provider]  feeding supplement (BOOST HIGH PROTEIN) LIQD Take 1 Container by mouth 2 (two) times daily between meals.   Yes [provider]  ferrous sulfate 325 (65 FE) MG tablet Take 325 mg by mouth 2 (two) times daily with a meal.   Yes [provider]  hydrOXYzine (ATARAX) 25 MG tablet Take 25 mg by mouth 2 (two) times daily. 04/17/21  Yes [provider]  mirtazapine (REMERON) 30 MG tablet Take 30 mg by mouth at bedtime.  11/01/17  Yes [provider]  Multiple Vitamins-Minerals (MULTIVITAMIN WOMEN 50+) TABS Take 1 tablet by mouth daily with breakfast.   Yes [provider]      VITAL SIGNS:  Blood pressure (!) 110/59, pulse 66, temperature 98.1 F (36.7 C), temperature source Oral, resp. rate 15, height  (1.626 m), weight 54 kg, SpO2 97 %.  PHYSICAL EXAMINATION:  Physical Exam  GENERAL:  88 y.o.-year-old African-American female patient lying in the bed with no acute distress.  EYES: Pupils equal, round, reactive to light and accommodation. No scleral icterus. Extraocular muscles intact.  HEENT: Head atraumatic, normocephalic. Oropharynx and nasopharynx clear.  NECK:  Supple, no jugular venous distention. No thyroid enlargement, no tenderness.  LUNGS: Slightly diminished right basal breath sounds with crackles.. No use of accessory muscles of respiration.  CARDIOVASCULAR: Regular rate and rhythm, S1, S2 normal. No murmurs, rubs, or gallops.  ABDOMEN: Soft,  nondistended, nontender. Bowel sounds present. No organomegaly or mass.  EXTREMITIES: 1-2+ left lower extremity pitting edema with no cyanosis, or clubbing.  NEUROLOGIC: Cranial nerves II through XII are intact. Muscle strength 5/5 in all extremities. Sensation intact. Gait not checked.  PSYCHIATRIC: The patient is alert and disoriented x 3.  No good eye contact. SKIN: No obvious rash, lesion, or ulcer.   LABORATORY PANEL:   CBC Recent Labs  Lab 06/02/21 2304  WBC 13.6*  HGB 11.9*  HCT 34.0*  PLT 348   ------------------------------------------------------------------------------------------------------------------  Chemistries  Recent Labs  Lab 06/02/21 2304  NA 135  K 4.1  CL 102  CO2 22  GLUCOSE 102*  BUN 17  CREATININE 0.55  CALCIUM 9.2   ------------------------------------------------------------------------------------------------------------------  Cardiac Enzymes No results for input(s): TROPONINI in the last 168 hours. ------------------------------------------------------------------------------------------------------------------  RADIOLOGY:  DG Chest 2 View  Result Date: 06/03/2021 CLINICAL DATA:  Chest pain. EXAM: CHEST - 2 VIEW COMPARISON:  11/07/2018. FINDINGS: The heart is enlarged. There is atherosclerotic calcification of the aorta. There is rightward shift of the mid trachea. Fullness of the right hilum is noted. Lung volumes are low. No consolidation, effusion, or pneumothorax. There is a chronic  fracture deformity of the proximal left humerus. No acute osseous abnormality is seen. IMPRESSION: 1. Fullness of the right hilum and rightward shift of the trachea which is new from the previous exam. CT is recommended for further evaluation to exclude the possibility of underlying mass. 2. Cardiomegaly. Electronically Signed   By: Thornell SartoriusLaura  Taylor M.D.   On: 06/03/2021 00:13   CT HEAD WO CONTRAST (5MM)  Result Date: 06/03/2021 CLINICAL DATA:  Syncope/presyncope.  EXAM: CT HEAD WITHOUT CONTRAST TECHNIQUE: Contiguous axial images were obtained from the base of the skull through the vertex without intravenous contrast. RADIATION DOSE REDUCTION: This exam was performed according to the departmental dose-optimization program which includes automated exposure control, adjustment of the mA and/or kV according to patient size and/or use of iterative reconstruction technique. COMPARISON:  04/26/2021. FINDINGS: Brain: No acute intracranial hemorrhage, midline shift or mass effect. No extra-axial fluid collection. Diffuse atrophy is noted. Subcortical and periventricular white matter hypodensities are noted bilaterally. No hydrocephalus. Vascular: There is atherosclerotic calcification of the carotid siphons. No hyperdense vessel. Skull: Normal. Negative for fracture or focal lesion. Sinuses/Orbits: No acute finding. Other: None. IMPRESSION: 1. No acute intracranial process. 2. Atrophy with chronic microvascular ischemic changes. Electronically Signed   By: Thornell SartoriusLaura  Taylor M.D.   On: 06/03/2021 01:56   CT Chest W Contrast  Result Date: 06/03/2021 CLINICAL DATA:  Pleural effusion, malignancy suspected. EXAM: CT CHEST WITH CONTRAST TECHNIQUE: Multidetector CT imaging of the chest was performed during intravenous contrast administration. RADIATION DOSE REDUCTION: This exam was performed according to the departmental dose-optimization program which includes automated exposure control, adjustment of the mA and/or kV according to patient size and/or use of iterative reconstruction technique. CONTRAST:  75mL OMNIPAQUE IOHEXOL 300 MG/ML  SOLN COMPARISON:  11/08/2018. FINDINGS: Cardiovascular: The heart is normal in size and multi-vessel coronary artery calcifications are present. There is atherosclerotic calcification of the aorta without evidence of aneurysm. The pulmonary trunk is distended which may be associated with underlying pulmonary artery hypertension. Mediastinum/Nodes: No  mediastinal, hilar, or axillary lymphadenopathy. The thyroid gland is enlarged with multiple nodules measuring up to 2.6 cm on the left resulting in rightward shift of the trachea. The esophagus is within normal limits. Lungs/Pleura: Apical pleural thickening or scarring is noted on the left. Strandy atelectasis is noted bilaterally. There is mild airspace disease in the right lower lobe. No effusion or pneumothorax. No abnormality is seen in the region of the right hilum and previous findings were likely related to overlapping densities. Upper Abdomen: There is a cyst in the posterior right liver measuring 4.4 cm. Musculoskeletal: An old rib fracture is noted on the right. There are degenerative changes in the thoracic spine. There is a fracture of the sternum with bone callus formation. Multiple compression deformities are present in the thoracic spine at T4, T5, T7, T9, T11, T12, and L1. IMPRESSION: 1. No evidence of pulmonary nodule or mass. 2. Strandy atelectasis in the lungs bilaterally with mild airspace disease at the right lung base, possible atelectasis or infiltrate. 3. Aortic atherosclerosis. 4. Multiple compression deformities in the thoracolumbar spine which are indeterminate in age. Electronically Signed   By: Thornell SartoriusLaura  Taylor M.D.   On: 06/03/2021 02:07      IMPRESSION AND PLAN:  Assessment and Plan: * Chest pain- (present on admission) - The patient will be admitted to a cardiac telemetry bed. - We will follow serial troponins. - She will be placed on as needed sublingual nitroglycerin and IV morphine sulfate for pain. -  I suspect it is likely atypical probably GI related.  Syncope, vasovagal - Will check orthostatics q 12 hours. - Will obtain a bilateral 2D echo.   - The patient will be gently hydrated with IV normal saline and monitored for arrhythmias. Differential diagnosis would include neurally mediated syncope, cardiogenic, arrhythmias related,  orthostatic hypotension and less  likely hypoglycemia.  Aspiration pneumonia (HCC) - We will place on IV Unasyn and Zithromax. - Mucolytic therapy and bronchodilator therapy will be provided. - We will follow blood cultures.  History of pulmonary embolism - We will obtain a D-dimer. - A 2D echo will be obtained to assess for syncope and chest pain. - Her D-dimer was elevated a VQ scan can be obtained.   DVT prophylaxis: Lovenox.  Advanced Care Planning:  Code Status: full code.  Family Communication:  The plan of care was discussed in details with the patient (and family). I answered all questions. The patient agreed to proceed with the above mentioned plan. Further management will depend upon hospital course. Disposition Plan: Back to previous home environment Consults called: none.  All the records are reviewed and case discussed with ED provider.  Status is: Inpatient . At the time of the admission, it appears that the appropriate admission status for this patient is inpatient.  This is judged to be reasonable and necessary in order to provide the required intensity of service to ensure the patient's safety given the presenting symptoms, physical exam findings and initial radiographic and laboratory data in the context of comorbid conditions.  The patient requires inpatient status due to high intensity of service, high risk of further deterioration and high frequency of surveillance required.  I certify that at the time of admission, it is my clinical judgment that the patient will require inpatient hospital care extending more than 2 midnights.                            Dispo: The patient is from: Home              Anticipated d/c is to: Home              Patient currently is not medically stable to d/c.              Difficult to place patient: No  Hannah Beat M.D on 06/03/2021 at 7:06 AM  [facility]   From 7 PM-7 AM, contact night-coverage [email or web address]  CC: Primary care physician; Georgianne Fick, MD

## 2021-06-03 NOTE — Assessment & Plan Note (Signed)
-   The patient will be admitted to a cardiac telemetry bed. ?- We will follow serial troponins. ?- She will be placed on as needed sublingual nitroglycerin and IV morphine sulfate for pain. ?- I suspect it is likely atypical probably GI related. ?

## 2021-06-03 NOTE — Assessment & Plan Note (Signed)
-   Will check orthostatics q 12 hours. ?- Will obtain a bilateral 2D echo.   ?- The patient will be gently hydrated with IV normal saline and monitored for arrhythmias. ?Differential diagnosis would include neurally mediated syncope, cardiogenic, arrhythmias related,  orthostatic hypotension and less likely hypoglycemia. ?

## 2021-06-03 NOTE — Evaluation (Signed)
Clinical/Bedside Swallow Evaluation ?Patient Details  ?Name: Connie Snyder ?MRN: [id number] ?Date of Birth: 03/15/1928 ? ?Today's Date: 06/03/2021 ?Time: SLP Start Time (ACUTE ONLY): 1019 SLP Stop Time (ACUTE ONLY): 1035 ?SLP Time Calculation (min) (ACUTE ONLY): 16 min ? ?Past Medical History:  ?Past Medical History:  ?Diagnosis Date  ? Arthritis   ? Sjogren's disease (HCC)   ? ?Past Surgical History:  ?Past Surgical History:  ?Procedure Laterality Date  ? TOTAL HIP ARTHROPLASTY Left 12/22/2017  ? Procedure: TOTAL HIP ARTHROPLASTY ANTERIOR APPROACH;  Surgeon: Tarry KosXu, Naiping M, MD;  Location: [facility] OR;  Service: Orthopedics;  Laterality: Left;  ? ?HPI:  ?Pt is a 88 yo female presenting after syncopal event, precipitated by midsternal CP and vomiting. CT Head negative for acute changes. CT Chest showed mild airspace disease at the R lung base, possible atelectasis or infiltrate. Pt was previously seen in September 2019 for clinical swallow eval with cognitively based dysphagia noted. Due to oral holding, pt was placed on a pureed diet with thin liquids. PMH includes: Sjogren's syndrome, PE, nonalcoholic fatty liver disease, vascular dementia without behavioral changes, rheumatoid arthritis and chronic anemia  ?  ?Assessment / Plan / Recommendation  ?Clinical Impression ? Pt is edentulous and subjectively describes difficulty with mastication, also exhibiting prolonged, seemingly effortful chewing with bite-sized pieces of graham crackers. She cleared her mouth well when given a little applesauce to soften and moisten the bites. Recommend Dys 2 (finely chopped) diet and thin liquids to facilitate oral preparation. This appears to be consistent with previous evaluation and is suspected to be at or very near to her baseline swallowing function. Therefore, no acute SLP needs are anticipated. Will sign off for now - solids can be modified by MD/RN per son request based on what he typically offers her at home. ?SLP Visit Diagnosis:  Dysphagia, unspecified (R13.10) ?   ?Aspiration Risk ? Mild aspiration risk  ?  ?Diet Recommendation Dysphagia 2 (Fine chop);Thin liquid (could modify solids per son's request based on what she typically eats at home)  ? ?Liquid Administration via: Cup;Straw ?Medication Administration: Whole meds with liquid ?Supervision: Staff to assist with self feeding ?Compensations: Minimize environmental distractions;Slow rate;Small sips/bites ?Postural Changes: Seated upright at 90 degrees;Remain upright for at least 30 minutes after po intake  ?  ?Other  Recommendations Oral Care Recommendations: Oral care BID   ? ?Recommendations for follow up therapy are one component of a multi-disciplinary discharge planning process, led by the attending physician.  Recommendations may be updated based on patient status, additional functional criteria and insurance authorization. ? ?Follow up Recommendations No SLP follow up  ? ? ?  ?Assistance Recommended at Discharge Frequent or constant Supervision/Assistance  ?Functional Status Assessment Patient has not had a recent decline in their functional status  ?Frequency and Duration    ?  ?  ?   ? ?Prognosis    ? ?  ? ?Swallow Study   ?General HPI: Pt is a 88 yo female presenting after syncopal event, precipitated by midsternal CP and vomiting. CT Head negative for acute changes. CT Chest showed mild airspace disease at the R lung base, possible atelectasis or infiltrate. Pt was previously seen in September 2019 for clinical swallow eval with cognitively based dysphagia noted. Due to oral holding, pt was placed on a pureed diet with thin liquids. PMH includes: Sjogren's syndrome, PE, nonalcoholic fatty liver disease, vascular dementia without behavioral changes, rheumatoid arthritis and chronic anemia ?Type of Study: Bedside Swallow Evaluation ?Previous Swallow  Assessment: see HPI ?Diet Prior to this Study: Regular;Thin liquids ?Temperature Spikes Noted: No ?Respiratory Status: Room  air ?History of Recent Intubation: No ?Behavior/Cognition: Alert;Cooperative;Pleasant mood;Confused;Requires cueing ?Oral Cavity Assessment: Within Functional Limits ?Oral Care Completed by SLP: No ?Oral Cavity - Dentition: Edentulous ?Vision: Functional for self-feeding ?Self-Feeding Abilities: Needs assist ?Patient Positioning: Upright in bed ?Baseline Vocal Quality: Normal ?Volitional Cough: Strong ?Volitional Swallow: Able to elicit  ?  ?Oral/Motor/Sensory Function Overall Oral Motor/Sensory Function: Within functional limits   ?Ice Chips Ice chips: Not tested   ?Thin Liquid Thin Liquid: Within functional limits ?Presentation: Cup;Self Fed;Straw  ?  ?Nectar Thick Nectar Thick Liquid: Not tested   ?Honey Thick Honey Thick Liquid: Not tested   ?Puree Puree: Within functional limits ?Presentation: Spoon   ?Solid ? ? ?  Solid: Impaired ?Oral Phase Impairments: Impaired mastication  ? ?  ? ?Mahala Menghini., M.A. CCC-SLP ?Acute Rehabilitation Services ?Pager [phone] ?Office [phone] ? ?06/03/2021,11:21 AM ? ? ? ? ?

## 2021-06-03 NOTE — ED Notes (Signed)
Patient transported to CT 

## 2021-06-03 NOTE — Progress Notes (Signed)
Pharmacy Antibiotic Note ? ?Connie Snyder is a 88 y.o. female admitted on 06/02/2021 with aspiration pneumonia.  Pharmacy has been consulted for Unasyn dosing. ? ?Plan: ?Unasyn 3g IV Q6H. ? ?Height: 5\' 4"  (162.6 cm) ?Weight: 54 kg (119 lb 0.8 oz) ?IBW/kg (Calculated) : 54.7 ? ?Temp (24hrs), Avg:98.1 ?F (36.7 ?C), Min:98.1 ?F (36.7 ?C), Max:98.1 ?F (36.7 ?C) ? ?Recent Labs  ?Lab 06/02/21 ?2304  ?WBC 13.6*  ?CREATININE 0.55  ?  ?Estimated Creatinine Clearance: 39 mL/min (by C-G formula based on SCr of 0.55 mg/dL).   ? ?Allergies  ?Allergen Reactions  ? Aricept [Donepezil] Nausea And Vomiting  ? ? ?Thank you for allowing pharmacy to be a part of this patient?s care. ? ?Vernard Gambles, PharmD, BCPS  ?06/03/2021 4:38 AM ? ?

## 2021-06-03 NOTE — Progress Notes (Signed)
Lower extremity venous has been completed.  ? ?Preliminary results in CV Proc.  ? ?Connie Snyder ?06/03/2021 3:14 PM    ?

## 2021-06-03 NOTE — ED Notes (Signed)
The pt has been asleep until the edp woke her up ?

## 2021-06-03 NOTE — ED Notes (Signed)
The pt was sleeping soundly until the admitting came in to check her ?

## 2021-06-03 NOTE — Assessment & Plan Note (Signed)
-   We will obtain a D-dimer. ?- A 2D echo will be obtained to assess for syncope and chest pain. ?- Her D-dimer was elevated a VQ scan can be obtained. ?

## 2021-06-03 NOTE — Progress Notes (Signed)
Patient seen and examined this morning, admitted overnight, H&P reviewed and agree with the assessment and plan. ? ?In brief, this is a 88 year old female with Sjogren's syndrome, prior PE, nonalcoholic fatty liver disease, vascular dementia who comes into the hospital with syncopal episode.  Apparently she had some chest pain before this happening, passed out and remained unconscious for about 5 minutes.  She also had an episode of nausea and vomiting, followed by coughing.  In the ED imaging showed aspiration pneumonia, was placed on Unasyn and admitted to the hospital.  Of note, she has a history of a fall in January 2023, she had a CT of the hip back then which was negative for acute fractures but it did show right hip hematoma with surrounding interstitial hemorrhage and at that point her anticoagulation has been held.  There are some concern about PE currently, D-dimer is quite elevated.  We will repeat a CT of the right hip, awaiting 2D echo and likely needs a CT angiogram ? ?Kody Vigil M. Elvera Lennox, MD, PhD ?[facility] ? ?Between 7 am - 7 pm you can contact me via Amion (for emergencies) or Securechat (non urgent matters).  ?I am not available 7 pm - 7 am, please contact night coverage MD/APP via Amion ?

## 2021-06-04 DIAGNOSIS — R451 Restlessness and agitation: Secondary | ICD-10-CM

## 2021-06-04 LAB — CBC
HCT: 28.1 % — ABNORMAL LOW (ref 36.0–46.0)
Hemoglobin: 10 g/dL — ABNORMAL LOW (ref 12.0–15.0)
MCH: 30.6 pg (ref 26.0–34.0)
MCHC: 35.6 g/dL (ref 30.0–36.0)
MCV: 85.9 fL (ref 80.0–100.0)
Platelets: 292 10*3/uL (ref 150–400)
RBC: 3.27 MIL/uL — ABNORMAL LOW (ref 3.87–5.11)
RDW: 14.2 % (ref 11.5–15.5)
WBC: 5.6 10*3/uL (ref 4.0–10.5)
nRBC: 0 % (ref 0.0–0.2)

## 2021-06-04 LAB — BASIC METABOLIC PANEL
Anion gap: 6 (ref 5–15)
BUN: 11 mg/dL (ref 8–23)
CO2: 24 mmol/L (ref 22–32)
Calcium: 8.1 mg/dL — ABNORMAL LOW (ref 8.9–10.3)
Chloride: 106 mmol/L (ref 98–111)
Creatinine, Ser: 0.51 mg/dL (ref 0.44–1.00)
GFR, Estimated: >60 mL/min (ref >60)
Glucose, Bld: 84 mg/dL (ref 70–99)
Potassium: 3.7 mmol/L (ref 3.5–5.1)
Sodium: 136 mmol/L (ref 135–145)

## 2021-06-04 MED ORDER — HALOPERIDOL LACTATE 5 MG/ML IJ SOLN: 2.0000 mg | Freq: Once | INTRAMUSCULAR | Status: AC | PRN

## 2021-06-04 MED ORDER — HALOPERIDOL LACTATE 5 MG/ML IJ SOLN
INTRAMUSCULAR | Status: AC
  Administered 2021-06-04: 2 mg via INTRAVENOUS
  Filled 2021-06-04: qty 1

## 2021-06-04 MED ORDER — ENOXAPARIN SODIUM 40 MG/0.4ML IJ SOSY: 40.0000 mg | PREFILLED_SYRINGE | INTRAMUSCULAR | Status: DC

## 2021-06-04 MED ORDER — HEPARIN (PORCINE) 25000 UT/250ML-% IV SOLN
900.0000 [IU]/h | INTRAVENOUS | Status: AC
  Administered 2021-06-04 – 2021-06-05 (×2): 800 [IU]/h via INTRAVENOUS
  Administered 2021-06-06: 900 [IU]/h via INTRAVENOUS
  Filled 2021-06-04 (×3): qty 250

## 2021-06-04 NOTE — Evaluation (Signed)
Physical Therapy Evaluation ?Patient Details ?Name: Connie Snyder ?MRN: [id number] ?DOB: [birth year] ?Today's Date: 06/04/2021 ? ?History of Present Illness ? 88 y/o female presented to ED on 06/03/21 for syncopal episode after having midsternal chest pain with associated vomiting. Concerned for aspiration pneumonia. CT chest showed mild airspace disease at the R lung base, possible atelectasis or infiltrate. PMH includes: Sjogren's syndrome, PE, nonalcoholic fatty liver disease, vascular dementia without behavioral changes, rheumatoid arthritis and chronic anemia  ?Clinical Impression ? Patient admitted with above findings. Patient has hx of dementia and is pleasantly confused during the session. Patient presents with generalized weakness, impaired balance, and decreased activity tolerance. Patient required minA to stand from EOB and min guard for short ambulation distance in the room with RW due to patient declining hallway ambulation. Patient will benefit from skilled PT services during acute stay to address listed deficits. Recommend HHPT at discharge if family is able to provide 24/7 supervision for patient safety.    ?   ? ?Recommendations for follow up therapy are one component of a multi-disciplinary discharge planning process, led by the attending physician.  Recommendations may be updated based on patient status, additional functional criteria and insurance authorization. ? ?Follow Up Recommendations Home health PT ? ?  ?Assistance Recommended at Discharge Frequent or constant Supervision/Assistance  ?Patient can return home with the following ? A little help with walking and/or transfers;A little help with bathing/dressing/bathroom;Assistance with cooking/housework;Direct supervision/assist for medications management;Direct supervision/assist for financial management;Assist for transportation;Help with stairs or ramp for entrance ? ?  ?Equipment Recommendations Rolling Arlethia Basso (2 wheels)  ?Recommendations for  Other Services ?    ?  ?Functional Status Assessment Patient has had a recent decline in their functional status and demonstrates the ability to make significant improvements in function in a reasonable and predictable amount of time.  ? ?  ?Precautions / Restrictions Precautions ?Precautions: Fall ?Restrictions ?Weight Bearing Restrictions: No  ? ?  ? ?Mobility ? Bed Mobility ?Overal bed mobility: Needs Assistance ?Bed Mobility: Supine to Sit, Sit to Supine ?  ?  ?Supine to sit: Supervision ?Sit to supine: Supervision ?  ?  ?  ? ?Transfers ?Overall transfer level: Needs assistance ?Equipment used: Rolling Mayrene Bastarache (2 wheels) ?Transfers: Sit to/from Stand ?Sit to Stand: Min assist ?  ?  ?  ?  ?  ?General transfer comment: minA to recover posterior LOB upon standing ?  ? ?Ambulation/Gait ?Ambulation/Gait assistance: Min guard ?Gait Distance (Feet): 30 Feet ?Assistive device: Rolling Kamyah Wilhelmsen (2 wheels) ?Gait Pattern/deviations: Step-through pattern, Decreased stride length, Trunk flexed ?Gait velocity: decreased ?  ?  ?General Gait Details: patient declined hallway mobility and further distance. Ambulated in room with Rw and min guard for safety. no overt LOB and good management of Rw ? ?Stairs ?  ?  ?  ?  ?  ? ?Wheelchair Mobility ?  ? ?Modified Rankin (Stroke Patients Only) ?  ? ?  ? ?Balance Overall balance assessment: Needs assistance ?Sitting-balance support: No upper extremity supported, Feet supported ?Sitting balance-Leahy Scale: Good ?  ?  ?Standing balance support: Bilateral upper extremity supported, Reliant on assistive device for balance ?Standing balance-Leahy Scale: Poor ?Standing balance comment: reliant on UE support ?  ?  ?  ?  ?  ?  ?  ?  ?  ?  ?  ?   ? ? ? ?Pertinent Vitals/Pain Pain Assessment ?Pain Assessment: No/denies pain  ? ? ?Home Living Family/patient expects to be discharged to:: Private residence ?Living Arrangements:  Children ?Available Help at Discharge: Family ?Type of Home: House ?  ?  ?   ?  ?  ?Home Equipment: Agricultural consultant (2 wheels);Cane - single point ?Additional Comments: information gleaned from chart review  ?  ?Prior Function Prior Level of Function : Needs assist ?  ?  ?  ?  ?  ?  ?Mobility Comments: hx dementia, pt reports being ambulatory and has a RW and cane. Unsure of accuracy ?  ?  ? ? ?Hand Dominance  ?   ? ?  ?Extremity/Trunk Assessment  ? Upper Extremity Assessment ?Upper Extremity Assessment: Generalized weakness ?  ? 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  ? ?Cervical / Trunk Assessment ?Cervical / Trunk Assessment: Kyphotic  ?Communication  ? Communication: No difficulties  ?Cognition Arousal/Alertness: Awake/alert ?Behavior During Therapy: [facility] for tasks assessed/performed ?Overall Cognitive Status: History of cognitive impairments - at baseline ?  ?  ?  ?  ?  ?  ?  ?  ?  ?  ?  ?  ?  ?  ?  ?  ?General Comments: stating she is 52. Pleasantly confused ?  ?  ? ?  ?General Comments   ? ?  ?Exercises    ? ?Assessment/Plan  ?  ?PT Assessment Patient needs continued PT services  ?PT Problem List Decreased strength;Decreased activity tolerance;Decreased balance;Decreased mobility;Decreased cognition;Decreased knowledge of use of DME;Decreased safety awareness;Decreased knowledge of precautions ? ?   ?  ?PT Treatment Interventions DME instruction;Gait training;Functional mobility training;Therapeutic activities;Therapeutic exercise;Balance training;Patient/family education   ? ?PT Goals (Current goals can be found in the Care Plan section)  ?Acute Rehab PT Goals ?Patient Stated Goal: did not state ?PT Goal Formulation: Patient unable to participate in goal setting ?Time For Goal Achievement: 06/18/21 ?Potential to Achieve Goals: Fair ? ?  ?Frequency Min 3X/week ?  ? ? ?Co-evaluation   ?  ?  ?  ?  ? ? ?  ?AM-PAC PT "6 Clicks" Mobility  ?Outcome Measure Help needed turning from your back to your side  while in a flat bed without using bedrails?: A Little ?Help needed moving from lying on your back to sitting on the side of a flat bed without using bedrails?: A Little ?Help needed moving to and from a bed to a chair (including a wheelchair)?: A Little ?Help needed standing up from a chair using your arms (e.g., wheelchair or bedside chair)?: A Little ?Help needed to walk in hospital room?: A Little ?Help needed climbing 3-5 steps with a railing? : A Lot ?6 Click Score: 17 ? ?  ?End of Session Equipment Utilized During Treatment: Gait belt ?Activity Tolerance: Patient tolerated treatment well ?Patient left: in bed;with call bell/phone within reach;with bed alarm set ?Nurse Communication: Mobility status ?PT Visit Diagnosis: Unsteadiness on feet (R26.81);Muscle weakness (generalized) (M62.81);Other abnormalities of gait and mobility (R26.89) ?  ? ?Time: 4536-4680 ?PT Time Calculation (min) (ACUTE ONLY): 20 min ? ? ?Charges:   PT Evaluation ?$PT Eval Moderate Complexity: 1 Mod ?  ?  ?   ? ? ?Palmira Stickle A. Dan Humphreys, PT, DPT ?Acute Rehabilitation Services ?Pager [phone] ?Office [phone] ? ? ?Dajohn Ellender A Aprille Sawhney ?06/04/2021, 4:59 PM ? ?

## 2021-06-04 NOTE — TOC Progression Note (Signed)
Transition of Care (TOC) - Progression Note  ? ? ?Patient Details  ?Name: Connie Snyder ?MRN: [id number] ?Date of Birth: January 02, 1928 ? ?Transition of Care (TOC) CM/SW Contact  ?Leone Havenaylor, Svara Twyman Clinton, RN ?Phone Number: ?06/04/2021, 8:53 AM ? ?Clinical Narrative:    ?From home, here with chest pain, Asp Pna, syncopy(vasovagal), conts on iv abx.  TOC will continue to follow for dc needs. ? ? ?  ?  ? ?Expected Discharge Plan and Services ?  ?  ?  ?  ?  ?                ?  ?  ?  ?  ?  ?  ?  ?  ?  ?  ? ? ?Social Determinants of Health (SDOH) Interventions ?  ? ?Readmission Risk Interventions ?No flowsheet data found. ? ?

## 2021-06-04 NOTE — Progress Notes (Signed)
Mobility Specialist Progress Note: ? ? 06/04/21 1300  ?Mobility  ?Activity Ambulated with assistance to bathroom  ?Level of Assistance Moderate assist, patient does 50-74%  ?Assistive Device Front wheel walker  ?Distance Ambulated (ft) 30 ft  ?Activity Response Tolerated well  ?$Mobility charge 1 Mobility  ? ?Pt presenting with cognitive deficits, constantly asking where she is, and why. Requested to go to BR for BM, pt requiring modA to stand from EOB (and low toilet), minG during ambulation. Pt with successful BM, left sitting up in bed eating lunch.  ? ?Connie Snyder ?Acute Rehab ?Phone: [phone] ?Office Phone: [phone] ? ?

## 2021-06-04 NOTE — Progress Notes (Signed)
Patient ID: Connie Snyder, female   DOB: 1927-07-01, 88 y.o.   MRN: [id number] ?I have reviewed this patient's CT scan of her right hip area.  She obviously has a chronic hematoma that has been present since January.  From orthopedic standpoint, she can be back on blood thinning medications because this will be more important for her then dealing with a hematoma.  We can still watch that closely.  It is chronic and does not appear to be expanding. ?

## 2021-06-04 NOTE — Progress Notes (Signed)
Patient has been increasingly agitated tonight and not redirectable despite nonpharmacologic interventions and her home medications, yelling and presenting fall risk. Plan to give low-dose Haldol.  ?

## 2021-06-04 NOTE — Progress Notes (Signed)
PROGRESS NOTE  Connie Snyder NFA:[id number] DOB: 01/16/1928 DOA: 06/02/2021 PCP: Georgianne Fick, MD   LOS: 1 day   Brief Narrative / Interim history: 88 y.o. female with medical history significant for Sjogren's syndrome, PE, nonalcoholic fatty liver disease, vascular dementia without behavioral changes, rheumatoid arthritis and chronic anemia, presented to emergency room with acute onset of syncope. She apparently complained of midsternal chest pain with associated vomiting before she passed out.  She remained unconscious for about 5 minutes. Upon waking up she was having a cough and complaining of congestion. She has a history of a recently diagnosed PE in Dec 2022 per son, and was also admitted in Jan 2023 after a fall. She did not have a hip fracture after the fall but pretty significant hip hematoma for which her AC was discontinued.   Subjective / 24h Interval events: Doing well this morning, no chest pain, no shortness of breath.   Assesement and Plan: Principal Problem:   Chest pain Active Problems:   Syncope, vasovagal   Aspiration pneumonia (HCC)   History of pulmonary embolism   Assessment and Plan: Principal problem Chest pain, syncope, recent PE - patient with recently diagnosed PE, was on Eliquis up until Jan, then discontinued due to hip hematoma. She saw orthopedic surgery in office in Feb and ortho mentioned that she can resume blood thinners per PCP, however her eliquis was not resumed. LE Doppler negative for DVT, however CT angiogram currently negative for large PE but shows "right lower lobe subsegmental pulmonary embolus not excluded", and in addition a possible area of pulmonary infarction "Indeterminate oval subpleural right lower lobe 2.5 x 0.9 cm nodular-like density. Finding could represent atelectasis. Differential diagnosis includes a pulmonary infarction".  Although given normal echocardiogram and lack of large PE findings this is not likely to explain her  syncope, there is a fairly high suspicion that she does have a PE.  I have tried contacting the son on 4 different occasions today without success, could also not leave a voicemail, but I was able to discuss with the patient's daughter, who lives in [place], and have discussed risk/benefits of anticoagulation given known clot in December, has not completed a full minimum 3 months of anticoagulation and has been off blood thinners for the past 6 weeks.  Given unclarity, understanding risk benefits, will start anticoagulation and closely monitor CBC as well as her right hip -CT hip with "Similar large superficial mixed density hematoma overlying the lateral right hip measuring 9.2 x 5.3 x at least 18.3 cm". Discussed with Dr Magnus Ivan, she can be on bloodthinners  -2d echo showed normal EF, normal RV, moderately elevated PA pressure, grade 1 DD  Active problems Aspiration pneumonia (HCC) -continue unasyn   Anemia - of chronic disease, no apparent bleeding. Closely monitor on blood thinners  Dementia - without behavioral disturbances  History of Sjogrens - outpatient management  Scheduled Meds:  aspirin EC  162 mg Oral Daily   cholecalciferol  1,000 Units Oral Daily   docusate sodium  100 mg Oral BID   enoxaparin (LOVENOX) injection  40 mg Subcutaneous Q24H   feeding supplement  1 Container Oral BID BM   ferrous sulfate  325 mg Oral BID WC   guaiFENesin  600 mg Oral BID   hydrOXYzine  25 mg Oral BID   mirtazapine  30 mg Oral QHS   multivitamin with minerals  1 tablet Oral Q breakfast   Continuous Infusions:  ampicillin-sulbactam (UNASYN) IV 3 g (06/04/21 0512)  azithromycin 500 mg (06/04/21 1036)   PRN Meds:.acetaminophen **OR** acetaminophen, ipratropium-albuterol, magnesium hydroxide, ondansetron **OR** ondansetron (ZOFRAN) IV, zolpidem  Diet Orders (From admission, onward)     Start     Ordered   06/03/21 1030  DIET DYS 2 Room service appropriate? Yes; Fluid consistency: Thin  Diet  effective now       Comments: Heart healthy  Question Answer Comment  Room service appropriate? Yes   Fluid consistency: Thin      06/03/21 1029            DVT prophylaxis: enoxaparin (LOVENOX) injection 40 mg Start: 06/04/21 1600   Lab Results  Component Value Date   PLT 292 06/04/2021      Code Status: Full Code  Family Communication: d/w son over the phone  Status is: Inpatient  Remains inpatient appropriate because: severity of illness   Level of care: Telemetry Medical  Consultants:  Orthopedic surgery   Procedures:  2D echo  Microbiology  Cultures - pending  Antimicrobials: Unasyn 3/2 >>    Objective: Vitals:   06/04/21 0126 06/04/21 0414 06/04/21 0845 06/04/21 1116  BP:  126/70 (!) 135/114 93/73  Pulse:  71 70 92  Resp:  15    Temp:  98.6 F (37 C)    TempSrc:  Oral    SpO2:  99% 100% 98%  Weight: 50 kg     Height:        Intake/Output Summary (Last 24 hours) at 06/04/2021 1145 Last data filed at 06/04/2021 0946 Gross per 24 hour  Intake 2024.55 ml  Output --  Net 2024.55 ml   Wt Readings from Last 3 Encounters:  06/04/21 50 kg  05/12/21 54 kg  04/26/21 54.4 kg    Examination:  Constitutional: NAD Eyes: no scleral icterus ENMT: Mucous membranes are moist.  Neck: normal, supple Respiratory: clear to auscultation bilaterally, no wheezing, no crackles. Normal respiratory effort. No accessory muscle use.  Cardiovascular: Regular rate and rhythm, no murmurs / rubs / gallops. No LE edema. Good peripheral pulses Abdomen: non distended, no tenderness. Bowel sounds positive.  Musculoskeletal: no clubbing / cyanosis.  Skin: no rashes Neurologic: non focal   Data Reviewed: I have independently reviewed following labs and imaging studies   CBC Recent Labs  Lab 06/02/21 2304 06/04/21 0333  WBC 13.6* 5.6  HGB 11.9* 10.0*  HCT 34.0* 28.1*  PLT 348 292  MCV 86.7 85.9  MCH 30.4 30.6  MCHC 35.0 35.6  RDW 14.5 14.2    Recent Labs   Lab 06/02/21 2304 06/03/21 0859 06/04/21 0333  NA 135  --  136  K 4.1  --  3.7  CL 102  --  106  CO2 22  --  24  GLUCOSE 102*  --  84  BUN 17  --  11  CREATININE 0.55  --  0.51  CALCIUM 9.2  --  8.1*  DDIMER  --  10.26*  --     ------------------------------------------------------------------------------------------------------------------ No results for input(s): CHOL, HDL, LDLCALC, TRIG, CHOLHDL, LDLDIRECT in the last 72 hours.  No results found for: HGBA1C ------------------------------------------------------------------------------------------------------------------ No results for input(s): TSH, T4TOTAL, T3FREE, THYROIDAB in the last 72 hours.  Invalid input(s): FREET3  Cardiac Enzymes No results for input(s): CKMB, TROPONINI, MYOGLOBIN in the last 168 hours.  Invalid input(s): CK ------------------------------------------------------------------------------------------------------------------ No results found for: BNP  CBG: Recent Labs  Lab 06/02/21 2330  GLUCAP 98    Recent Results (from the past 240 hour(s))  Resp Panel  by RT-PCR (Flu A&B, Covid) Nasopharyngeal Swab     Status: None   Collection Time: 06/03/21  7:53 AM   Specimen: Nasopharyngeal Swab; Nasopharyngeal(NP) swabs in vial transport medium  Result Value Ref Range Status   SARS Coronavirus 2 by RT PCR NEGATIVE NEGATIVE Final    Comment: (NOTE) SARS-CoV-2 target nucleic acids are NOT DETECTED.  The SARS-CoV-2 RNA is generally detectable in upper respiratory specimens during the acute phase of infection. The lowest concentration of SARS-CoV-2 viral copies this assay can detect is 138 copies/mL. A negative result does not preclude SARS-Cov-2 infection and should not be used as the sole basis for treatment or other patient management decisions. A negative result may occur with  improper specimen collection/handling, submission of specimen other than nasopharyngeal swab, presence of viral  mutation(s) within the areas targeted by this assay, and inadequate number of viral copies(<138 copies/mL). A negative result must be combined with clinical observations, patient history, and epidemiological information. The expected result is Negative.  Fact Sheet for Patients:  BloggerCourse.com  Fact Sheet for Healthcare Providers:  SeriousBroker.it  This test is no t yet approved or cleared by the [place] FDA and  has been authorized for detection and/or diagnosis of SARS-CoV-2 by FDA under an Emergency Use Authorization (EUA). This EUA will remain  in effect (meaning this test can be used) for the duration of the COVID-19 declaration under Section 564(b)(1) of the Act, 21 U.S.C.section 360bbb-3(b)(1), unless the authorization is terminated  or revoked sooner.       Influenza A by PCR NEGATIVE NEGATIVE Final   Influenza B by PCR NEGATIVE NEGATIVE Final    Comment: (NOTE) The Xpert Xpress SARS-CoV-2/FLU/RSV plus assay is intended as an aid in the diagnosis of influenza from Nasopharyngeal swab specimens and should not be used as a sole basis for treatment. Nasal washings and aspirates are unacceptable for Xpert Xpress SARS-CoV-2/FLU/RSV testing.  Fact Sheet for Patients: BloggerCourse.com  Fact Sheet for Healthcare Providers: SeriousBroker.it  This test is not yet approved or cleared by the [place] FDA and has been authorized for detection and/or diagnosis of SARS-CoV-2 by FDA under an Emergency Use Authorization (EUA). This EUA will remain in effect (meaning this test can be used) for the duration of the COVID-19 declaration under Section 564(b)(1) of the Act, 21 U.S.C. section 360bbb-3(b)(1), unless the authorization is terminated or revoked.  Performed at [facility], [address], [place], [place] [address]   Culture, blood (routine x 2) Call MD  if unable to obtain prior to antibiotics being given     Status: None (Preliminary result)   Collection Time: 06/03/21  8:58 AM   Specimen: BLOOD RIGHT FOREARM  Result Value Ref Range Status   Specimen Description BLOOD RIGHT FOREARM  Final   Special Requests   Final    BOTTLES DRAWN AEROBIC AND ANAEROBIC Blood Culture adequate volume   Culture   Final    NO GROWTH < 24 HOURS Performed at [facility], [address] [place], [place] [address]    Report Status PENDING  Incomplete  Culture, blood (routine x 2) Call MD if unable to obtain prior to antibiotics being given     Status: None (Preliminary result)   Collection Time: 06/03/21  8:59 AM   Specimen: BLOOD RIGHT HAND  Result Value Ref Range Status   Specimen Description BLOOD RIGHT HAND  Final   Special Requests   Final    BOTTLES DRAWN AEROBIC AND  ANAEROBIC Blood Culture adequate volume   Culture   Final    NO GROWTH < 24 HOURS Performed at [facility], [address], [place], [place] [address]    Report Status PENDING  Incomplete     Radiology Studies: CT Angio Chest Pulmonary Embolism (PE) W or WO Contrast  Result Date: 06/03/2021 CLINICAL DATA:  Pulmonary embolism (PE) suspected, high prob. patient was eating, has episode of emesis, LOC. EXAM: CT ANGIOGRAPHY CHEST WITH CONTRAST TECHNIQUE: Multidetector CT imaging of the chest was performed using the standard protocol during bolus administration of intravenous contrast. Multiplanar CT image reconstructions and MIPs were obtained to evaluate the vascular anatomy. RADIATION DOSE REDUCTION: This exam was performed according to the departmental dose-optimization program which includes automated exposure control, adjustment of the mA and/or kV according to patient size and/or use of iterative reconstruction technique. CONTRAST:  OMNIPAQUE IOHEXOL 350 MG/ML SOLN COMPARISON:  CT angiography chest 11/08/2018 FINDINGS: Cardiovascular: Satisfactory opacification of the  pulmonary arteries to the segmental level. No evidence of pulmonary embolism. A right lower lobe subsegmental pulmonary embolus is not excluded. The main pulmonary artery is enlarged measuring up to 3.1 cm. Normal heart size. No significant pericardial effusion. The thoracic aorta is normal in caliber. Mild atherosclerotic plaque of the thoracic aorta. Three-vessel coronary artery calcifications. Query aortic valve leaflet calcifications. Mediastinum/Nodes: No enlarged mediastinal, hilar, or axillary lymph nodes. Thyroid gland, trachea, and esophagus demonstrate no significant findings. Lungs/Pleura: Mild centrilobular emphysematous changes. The left lower lobe linear atelectasis versus scarring. Right lower lobe linear atelectasis versus scarring. No focal consolidation. Oval subpleural right lower lobe 2.5 x 0.9 cm nodular-like density. No pulmonary mass. No pleural effusion. No pneumothorax. Upper Abdomen: There is a 4.1 cm fluid density lesion within the right hepatic lobe that likely represents a simple renal cyst. No acute abnormality. Musculoskeletal: Exophytic left chest wall 2.1 x 1.3 cm lesion (6:254). No suspicious lytic or blastic osseous lesions. Interval development of a subacute to chronic half shaft width displaced mid sternal body fracture. Redemonstration of a chronic similar-appearing T12 compression fracture. Chronic similar-appearing L1 compression fracture. Partially visualized known L2 compression fracture. Similar-appearing vertebral body by the height loss of the T8 vertebral body. Interval development of an age-indeterminate T11 incomplete burst fracture with greater than 75% height loss and 3 mm retropulsion. Interval development of a T10 compression fracture with greater than 20% height loss. Interval development of a T9 compression fracture with greater than 65% height loss. Interval development of a compression fracture of the T7 vertebral body with at least 40% height loss. Interval  worsening of vertebral body height loss and compression fracture of the T5 vertebral body with greater than 45% height loss. Similar-appearing T4 compression fracture. Interval development of compression fracture/vertebral body height loss of the T1 through T3 levels. Multilevel degenerative changes of the spine. Degenerative changes of bilateral shoulders. Query healing partially visualized left shoulder fracture. Old healed right rib fractures. Review of the MIP images confirms the above findings. IMPRESSION: 1. No segmental or central pulmonary embolus. Limited evaluation of the subsegmental level with a right lower lobe subsegmental pulmonary embolus not excluded. 2. Indeterminate oval subpleural right lower lobe 2.5 x 0.9 cm nodular-like density. Finding could represent atelectasis. Differential diagnosis includes a pulmonary infarction. Consider follow-up CT in 3 months. 3. Enlarged main pulmonary artery suggests pulmonary hypertension. 4. Interval development of a subacute to chronic half shaft width displaced mid sternal body fracture. 5. Interval development of an age-indeterminate T11 incomplete  burst fracture with greater than 75% height loss and 3 mm retropulsion. Recommend correlation with tenderness to palpation to evaluate for an acute component. 6. Interval development of multiple new age-indeterminate compression fractures/vertebral body height loss involving the T3, T4, T5, T7, T9, T10 levels. Similar-appearing compression fractures of the T8, T12, L1 vertebral bodies. Partially visualized known L2 compression fracture. Recommend correlation with tenderness to palpation to evaluate for an acute component. 7. Exophytic left chest wall 2.1 x 1.3 cm lesion. Correlate with physical exam. 8. Aortic Atherosclerosis (ICD10-I70.0) including three-vessel coronary artery and aortic valve leaflet calcifications. Correlate with aortic stenosis. Electronically Signed   By: Tish Frederickson M.D.   On: 06/03/2021  15:40   CT HIP RIGHT WO CONTRAST  Result Date: 06/03/2021 CLINICAL DATA:  Persistent right hip pain since fall in January. EXAM: CT OF THE RIGHT HIP WITHOUT CONTRAST TECHNIQUE: Multidetector CT imaging of the right hip was performed according to the standard protocol. Multiplanar CT image reconstructions were also generated. RADIATION DOSE REDUCTION: This exam was performed according to the departmental dose-optimization program which includes automated exposure control, adjustment of the mA and/or kV according to patient size and/or use of iterative reconstruction technique. COMPARISON:  Right hip CT dated April 26, 2021. FINDINGS: Bones/Joint/Cartilage No acute fracture or dislocation. Old fractures of the right puboacetabular junction and inferior pubic ramus again noted. 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 Chronic L5 vertebral body and sacral fractures again noted as well. The right hip joint space is relatively preserved. No joint effusion. Similar severe osteopenia. Ligaments Ligaments are suboptimally evaluated by CT. Muscles and Tendons Grossly intact. Soft tissue Large mixed density hematoma overlying the lateral right hip superficial fascia currently measures 9.2 x 5.3 x at least 18.3 cm (AP by transverse by CC), previously 10.2 x 5.3 x at least 16.3 cm. No soft tissue mass. IMPRESSION: 1. Similar large superficial mixed density hematoma overlying the lateral right hip measuring 9.2 x 5.3 x at least 18.3 cm. This could represent a degloving injury (Morel-Lavallee lesion). 2. No acute fracture. 3. Similar chronic right pelvic and lumbosacral fractures. Electronically Signed   By: Obie Dredge M.D.   On: 06/03/2021 12:32   VAS [place] LOWER EXTREMITY VENOUS (DVT)  Result Date: 06/03/2021  Lower Venous DVT Study Patient Name:  TEA COLLUMS [facility]  Date of Exam:   06/03/2021 Medical Rec #: [id number]        Accession #:    [id number] Date of Birth: 05-05-1927       Patient  Gender: F Patient Age:   40 years Exam Location:  [facility] Procedure:      VAS [place] LOWER EXTREMITY VENOUS (DVT) Referring Phys: Pamella Pert --------------------------------------------------------------------------------  Indications: Swelling, and Edema.  Comparison Study: no prior Performing Technologist: [place] Ponder RVS  Examination Guidelines: A complete evaluation includes B-mode imaging, spectral Doppler, color Doppler, and power Doppler as needed of all accessible portions of each vessel. Bilateral testing is considered an integral part of a complete examination. Limited examinations for reoccurring indications may be performed as noted. The reflux portion of the exam is performed with the patient in reverse Trendelenburg.  +---------+---------------+---------+-----------+----------+--------------+  RIGHT     Compressibility Phasicity Spontaneity Properties Thrombus Aging  +---------+---------------+---------+-----------+----------+--------------+  CFV       Full            Yes       Yes                                    +---------+---------------+---------+-----------+----------+--------------+  SFJ       Full                                                             +---------+---------------+---------+-----------+----------+--------------+  FV Prox   Full                                                             +---------+---------------+---------+-----------+----------+--------------+  FV Mid    Full                                                             +---------+---------------+---------+-----------+----------+--------------+  FV Distal Full                                                             +---------+---------------+---------+-----------+----------+--------------+  PFV       Full                                                             +---------+---------------+---------+-----------+----------+--------------+  POP       Full            Yes       Yes                                     +---------+---------------+---------+-----------+----------+--------------+  PTV       Full                                                             +---------+---------------+---------+-----------+----------+--------------+  PERO      Full                                                             +---------+---------------+---------+-----------+----------+--------------+   +---------+---------------+---------+-----------+----------+--------------+  LEFT      Compressibility Phasicity Spontaneity Properties Thrombus Aging  +---------+---------------+---------+-----------+----------+--------------+  CFV       Full            Yes       Yes                                    +---------+---------------+---------+-----------+----------+--------------+  SFJ       Full                                                             +---------+---------------+---------+-----------+----------+--------------+  FV Prox   Full                                                             +---------+---------------+---------+-----------+----------+--------------+  FV Mid    Full                                                             +---------+---------------+---------+-----------+----------+--------------+  FV Distal Full                                                             +---------+---------------+---------+-----------+----------+--------------+  PFV       Full                                                             +---------+---------------+---------+-----------+----------+--------------+  POP       Full            Yes       Yes                                    +---------+---------------+---------+-----------+----------+--------------+  PTV       Full                                                             +---------+---------------+---------+-----------+----------+--------------+  PERO      Full                                                              +---------+---------------+---------+-----------+----------+--------------+     Summary: BILATERAL: - No evidence of deep vein thrombosis seen in the lower extremities, bilaterally. -No evidence of popliteal cyst, bilaterally.   *See table(s) above for measurements and observations. Electronically signed by Heath Lark on 06/03/2021 at 3:48:44 PM.    Final      Pamella Pert, MD, PhD  [facility]  Between 7 am - 7 pm I am available, please contact me via Amion (for emergencies) or Securechat (non urgent messages)  Between 7 pm - 7 am I am not available, please contact night coverage MD/APP via Amion

## 2021-06-04 NOTE — Progress Notes (Signed)
ANTICOAGULATION CONSULT NOTE - Initial Consult ? ?Pharmacy Consult for IV Heparin ?Indication: pulmonary embolus ? ?Allergies  ?Allergen Reactions  ? Aricept [Donepezil] Nausea And Vomiting  ? ? ?Patient Measurements: ?Height: 5\' 4"  (162.6 cm) ?Weight: 50 kg (110 lb 3.7 oz) ?IBW/kg (Calculated) : 54.7 ?Heparin Dosing Weight: 50 kg ? ?Vital Signs: ?BP: 93/73 (03/03 1116) ?Pulse Rate: 92 (03/03 1116) ? ?Labs: ?Recent Labs  ?  06/02/21 ?2304 06/03/21 ?0224 06/04/21 ?0932  ?HGB 11.9*  --  10.0*  ?HCT 34.0*  --  28.1*  ?PLT 348  --  292  ?CREATININE 0.55  --  0.51  ?TROPONINIHS 8 3  --   ? ? ?Estimated Creatinine Clearance: 36.2 mL/min (by C-G formula based on SCr of 0.51 mg/dL). ? ? ?Medical History: ?Past Medical History:  ?Diagnosis Date  ? Arthritis   ? Sjogren's disease (HCC)   ? ? ?Medications:  ?Medications Prior to Admission  ?Medication Sig Dispense Refill Last Dose  ? acetaminophen (TYLENOL) 650 MG CR tablet Take 1,300 mg by mouth in the morning and at bedtime.   06/01/2021  ? ASPIRIN LOW DOSE 81 MG EC tablet Take 162 mg by mouth daily.   06/02/2021  ? celecoxib (CELEBREX) 100 MG capsule Take 100 mg by mouth 2 (two) times daily as needed for mild pain.   06/01/2021  ? Cholecalciferol (VITAMIN D-3) 25 MCG (1000 UT) CAPS Take 1,000 Units by mouth daily.   05/31/2021  ? docusate sodium (COLACE) 100 MG capsule Take 100 mg by mouth 2 (two) times daily.   06/01/2021  ? feeding supplement (BOOST HIGH PROTEIN) LIQD Take 1 Container by mouth 2 (two) times daily between meals.   06/01/2021  ? ferrous sulfate 325 (65 FE) MG tablet Take 325 mg by mouth 2 (two) times daily with a meal.   05/31/2021  ? hydrOXYzine (ATARAX) 25 MG tablet Take 25 mg by mouth 2 (two) times daily.   06/01/2021  ? mirtazapine (REMERON) 30 MG tablet Take 30 mg by mouth at bedtime.   3 06/01/2021  ? Multiple Vitamins-Minerals (MULTIVITAMIN WOMEN 50+) TABS Take 1 tablet by mouth daily with breakfast.   05/31/2021  ? ?Scheduled:  ? aspirin EC  162 mg Oral Daily   ? cholecalciferol  1,000 Units Oral Daily  ? docusate sodium  100 mg Oral BID  ? enoxaparin (LOVENOX) injection  40 mg Subcutaneous Q24H  ? feeding supplement  1 Container Oral BID BM  ? ferrous sulfate  325 mg Oral BID WC  ? guaiFENesin  600 mg Oral BID  ? hydrOXYzine  25 mg Oral BID  ? mirtazapine  30 mg Oral QHS  ? multivitamin with minerals  1 tablet Oral Q breakfast  ? ?Infusions:  ? ampicillin-sulbactam (UNASYN) IV 3 g (06/04/21 1701)  ? azithromycin 500 mg (06/04/21 1036)  ? ? ?Assessment: ?88 years of age female with a history of pulmonary embolism in December and placed on Eliquis then had fall in January with hip hematoma and anticoagulation was discontinued. Patient presents this admission with chest pain and syncope concerning for pulmonary embolism. Pharmacy has been consulted to start IV heparin without a bolus for pulmonary embolism. ? ?CBC low-stable. Platelets within normal limits.    ?Last Eliquis >6 weeks ago. D-dimer 10.26.  ? ?Goal of Therapy:  ?Heparin level 0.3-0.7 units/ml ?Monitor platelets by anticoagulation protocol: Yes ?  ?Plan:  ?Discontinue Lovenox order.  ?Start IV Heparin (no bolus) at 800 units/hr.  ?Heparin level in 8 hours.  ?  Daily heparin level and CBC while on therapy.  ? ?Link Snuffer, PharmD, BCPS, BCCCP ?Clinical Pharmacist ?Please refer to [facility] for [facility] numbers ?06/04/2021,6:06 PM ? ? ?

## 2021-06-05 LAB — CBC
HCT: 29.7 % — ABNORMAL LOW (ref 36.0–46.0)
Hemoglobin: 10.3 g/dL — ABNORMAL LOW (ref 12.0–15.0)
MCH: 29.8 pg (ref 26.0–34.0)
MCHC: 34.7 g/dL (ref 30.0–36.0)
MCV: 85.8 fL (ref 80.0–100.0)
Platelets: 295 10*3/uL (ref 150–400)
RBC: 3.46 MIL/uL — ABNORMAL LOW (ref 3.87–5.11)
RDW: 14.1 % (ref 11.5–15.5)
WBC: 7.1 10*3/uL (ref 4.0–10.5)
nRBC: 0 % (ref 0.0–0.2)

## 2021-06-05 LAB — HEPARIN LEVEL (UNFRACTIONATED)
Heparin Unfractionated: 0.22 IU/mL — ABNORMAL LOW (ref 0.30–0.70)
Heparin Unfractionated: 0.27 IU/mL — ABNORMAL LOW (ref 0.30–0.70)
Heparin Unfractionated: 0.43 IU/mL (ref 0.30–0.70)

## 2021-06-05 MED ORDER — HALOPERIDOL LACTATE 5 MG/ML IJ SOLN
2.0000 mg | Freq: Once | INTRAMUSCULAR | Status: AC | PRN
  Administered 2021-06-06: 2 mg via INTRAVENOUS
  Filled 2021-06-05: qty 1

## 2021-06-05 NOTE — Progress Notes (Addendum)
ANTICOAGULATION CONSULT NOTE - Initial Consult ? ?Pharmacy Consult for IV heparin ?Indication: pulmonary embolus ? ?Allergies  ?Allergen Reactions  ? Aricept [Donepezil] Nausea And Vomiting  ? ? ?Patient Measurements: ?Height: 5\' 4"  (162.6 cm) ?Weight: 52 kg (114 lb 10.2 oz) ?IBW/kg (Calculated) : 54.7 ?Heparin Dosing Weight: 50 kg ? ?Vital Signs: ?Temp: 97.7 ?F (36.5 ?C) (03/04 0343) ?Temp Source: Oral (03/04 0343) ?BP: 149/84 (03/04 0343) ?Pulse Rate: 62 (03/04 0343) ? ?Labs: ?Recent Labs  ?  06/02/21 ?2304 06/03/21 ?0224 06/04/21 ?[id number] 06/05/21 ?0231  ?HGB 11.9*  --  10.0* 10.3*  ?HCT 34.0*  --  28.1* 29.7*  ?PLT 348  --  292 295  ?HEPARINUNFRC  --   --   --  0.22*  ?CREATININE 0.55  --  0.51  --   ?TROPONINIHS 8 3  --   --   ? ? ?Estimated Creatinine Clearance: 37.6 mL/min (by C-G formula based on SCr of 0.51 mg/dL). ? ? ?Medical History: ?Past Medical History:  ?Diagnosis Date  ? Arthritis   ? Sjogren's disease ([place])   ? ?Assessment: ?Patient currently on IV heparin 800 units/hr. Heparin level came back subtherapeutic at 0.22. Called the floor and nurse said that line was not infusing properly when heparin level was taken. Line re-established at [place]. ? ?Goal of Therapy:  ?Heparin level 0.3-0.7 units/ml ?Monitor platelets by anticoagulation protocol: Yes ?  ?Plan:  ?Continue heparin infusion 800 units/hr ?Heparin level 8 hours after line re-established ?Daily heparin level and CBC while on therapy ? ?[name] ?06/05/2021,3:54 AM ? ? ?

## 2021-06-05 NOTE — Progress Notes (Signed)
PROGRESS NOTE  Connie Snyder:[id number] DOB: 08/29/1927 DOA: 06/02/2021 PCP: Georgianne Fick, MD   LOS: 2 days   Brief Narrative / Interim history: 88 y.o. female with medical history significant for Sjogren's syndrome, PE, nonalcoholic fatty liver disease, vascular dementia without behavioral changes, rheumatoid arthritis and chronic anemia, presented to emergency room with acute onset of syncope. She apparently complained of midsternal chest pain with associated vomiting before she passed out.  She remained unconscious for about 5 minutes. Upon waking up she was having a cough and complaining of congestion. She has a history of a recently diagnosed PE in Dec 2022 per son, and was also admitted in Jan 2023 after a fall. She did not have a hip fracture after the fall but pretty significant hip hematoma for which her AC was discontinued.   Subjective / 24h Interval events: More confused overnight, calm this morning.  No complaints  Assesement and Plan: Principal Problem:   Chest pain Active Problems:   Syncope, vasovagal   Aspiration pneumonia (HCC)   History of pulmonary embolism  Assessment and Plan: Principal problem Chest pain, syncope, recent PE - patient with recently diagnosed PE, was on Eliquis up until Jan, then discontinued due to hip hematoma. She saw orthopedic surgery in office in Feb and ortho mentioned that she can resume blood thinners per PCP, however her eliquis was not resumed. LE Doppler negative for DVT, however CT angiogram currently negative for large PE but shows "right lower lobe subsegmental pulmonary embolus not excluded", and in addition a possible area of pulmonary infarction "Indeterminate oval subpleural right lower lobe 2.5 x 0.9 cm nodular-like density. Finding could represent atelectasis. Differential diagnosis includes a pulmonary infarction".  Although given normal echocardiogram and lack of large PE findings this is not likely to explain her  syncope, there is a fairly high suspicion that she does have a PE.  She was diagnosed with PE on March 28, 2021, and has not completed treatment.  Discussed with daughter and son over the phone, have resume anticoagulation and monitor carefully now on IV heparin, if hemoglobin remains stable and her right hip is not bothering her we will switch to Eliquis tomorrow -CT hip with "Similar large superficial mixed density hematoma overlying the lateral right hip measuring 9.2 x 5.3 x at least 18.3 cm". Discussed with Dr Magnus Ivan, she can be on bloodthinners  -2d echo showed normal EF, normal RV, moderately elevated PA pressure, grade 1 DD  Active problems Aspiration pneumonia (HCC) -continue unasyn, total of 5 days, transition to Augmentin on discharge  Anemia - of chronic disease, no apparent bleeding. Closely monitor on blood thinners  Dementia - without behavioral disturbances  History of Sjogrens - outpatient management  Scheduled Meds:  aspirin EC  162 mg Oral Daily   cholecalciferol  1,000 Units Oral Daily   docusate sodium  100 mg Oral BID   feeding supplement  1 Container Oral BID BM   ferrous sulfate  325 mg Oral BID WC   guaiFENesin  600 mg Oral BID   hydrOXYzine  25 mg Oral BID   mirtazapine  30 mg Oral QHS   multivitamin with minerals  1 tablet Oral Q breakfast   Continuous Infusions:  ampicillin-sulbactam (UNASYN) IV 3 g (06/05/21 0647)   azithromycin 500 mg (06/05/21 0824)   heparin 800 Units/hr (06/05/21 0437)   PRN Meds:.acetaminophen **OR** acetaminophen, haloperidol lactate, ipratropium-albuterol, magnesium hydroxide, ondansetron **OR** ondansetron (ZOFRAN) IV, zolpidem  Diet Orders (From admission, onward)  Start     Ordered   06/03/21 1030  DIET DYS 2 Room service appropriate? Yes; Fluid consistency: Thin  Diet effective now       Comments: Heart healthy  Question Answer Comment  Room service appropriate? Yes   Fluid consistency: Thin      06/03/21 1029             DVT prophylaxis:    Lab Results  Component Value Date   PLT 295 06/05/2021      Code Status: Full Code  Family Communication: d/w son over the phone  Status is: Inpatient  Remains inpatient appropriate because: severity of illness   Level of care: Telemetry Medical  Consultants:  Orthopedic surgery   Procedures:  2D echo  Microbiology  Cultures - pending  Antimicrobials: Unasyn 3/2 >>    Objective: Vitals:   06/04/21 2005 06/05/21 0232 06/05/21 0343 06/05/21 1112  BP: 129/90  (!) 149/84 121/76  Pulse:   62 66  Resp:   19 18  Temp:   97.7 F (36.5 C) 98 F (36.7 C)  TempSrc:   Oral Oral  SpO2: 100%   99%  Weight:  52 kg    Height:        Intake/Output Summary (Last 24 hours) at 06/05/2021 1153 Last data filed at 06/05/2021 0830 Gross per 24 hour  Intake 1170 ml  Output 1800 ml  Net -630 ml    Wt Readings from Last 3 Encounters:  06/05/21 52 kg  05/12/21 54 kg  04/26/21 54.4 kg    Examination:  Constitutional: NAD Eyes: lids and conjunctivae normal, no scleral icterus ENMT: mmm Neck: normal, supple Respiratory: clear to auscultation bilaterally, no wheezing, no crackles. Normal respiratory effort.  Cardiovascular: Regular rate and rhythm, no murmurs / rubs / gallops. No LE edema. Abdomen: soft, no distention, no tenderness. Bowel sounds positive.  Skin: no rashes Neurologic: no focal deficits, equal strength  Data Reviewed: I have independently reviewed following labs and imaging studies   CBC Recent Labs  Lab 06/02/21 2304 06/04/21 0333 06/05/21 0231  WBC 13.6* 5.6 7.1  HGB 11.9* 10.0* 10.3*  HCT 34.0* 28.1* 29.7*  PLT 348 292 295  MCV 86.7 85.9 85.8  MCH 30.4 30.6 29.8  MCHC 35.0 35.6 34.7  RDW 14.5 14.2 14.1     Recent Labs  Lab 06/02/21 2304 06/03/21 0859 06/04/21 0333  NA 135  --  136  K 4.1  --  3.7  CL 102  --  106  CO2 22  --  24  GLUCOSE 102*  --  84  BUN 17  --  11  CREATININE 0.55  --  0.51   CALCIUM 9.2  --  8.1*  DDIMER  --  10.26*  --      ------------------------------------------------------------------------------------------------------------------ No results for input(s): CHOL, HDL, LDLCALC, TRIG, CHOLHDL, LDLDIRECT in the last 72 hours.  No results found for: HGBA1C ------------------------------------------------------------------------------------------------------------------ No results for input(s): TSH, T4TOTAL, T3FREE, THYROIDAB in the last 72 hours.  Invalid input(s): FREET3  Cardiac Enzymes No results for input(s): CKMB, TROPONINI, MYOGLOBIN in the last 168 hours.  Invalid input(s): CK ------------------------------------------------------------------------------------------------------------------ No results found for: BNP  CBG: Recent Labs  Lab 06/02/21 2330  GLUCAP 98     Recent Results (from the past 240 hour(s))  Resp Panel by RT-PCR (Flu A&B, Covid) Nasopharyngeal Swab     Status: None   Collection Time: 06/03/21  7:53 AM   Specimen: Nasopharyngeal Swab; Nasopharyngeal(NP) swabs in  vial transport medium  Result Value Ref Range Status   SARS Coronavirus 2 by RT PCR NEGATIVE NEGATIVE Final    Comment: (NOTE) SARS-CoV-2 target nucleic acids are NOT DETECTED.  The SARS-CoV-2 RNA is generally detectable in upper respiratory specimens during the acute phase of infection. The lowest concentration of SARS-CoV-2 viral copies this assay can detect is 138 copies/mL. A negative result does not preclude SARS-Cov-2 infection and should not be used as the sole basis for treatment or other patient management decisions. A negative result may occur with  improper specimen collection/handling, submission of specimen other than nasopharyngeal swab, presence of viral mutation(s) within the areas targeted by this assay, and inadequate number of viral copies(<138 copies/mL). A negative result must be combined with clinical observations, patient history,  and epidemiological information. The expected result is Negative.  Fact Sheet for Patients:  BloggerCourse.comhttps://www.fda.gov/media/152166/download  Fact Sheet for Healthcare Providers:  SeriousBroker.ithttps://www.fda.gov/media/152162/download  This test is no t yet approved or cleared by the [place] FDA and  has been authorized for detection and/or diagnosis of SARS-CoV-2 by FDA under an Emergency Use Authorization (EUA). This EUA will remain  in effect (meaning this test can be used) for the duration of the COVID-19 declaration under Section 564(b)(1) of the Act, 21 U.S.C.section 360bbb-3(b)(1), unless the authorization is terminated  or revoked sooner.       Influenza A by PCR NEGATIVE NEGATIVE Final   Influenza B by PCR NEGATIVE NEGATIVE Final    Comment: (NOTE) The Xpert Xpress SARS-CoV-2/FLU/RSV plus assay is intended as an aid in the diagnosis of influenza from Nasopharyngeal swab specimens and should not be used as a sole basis for treatment. Nasal washings and aspirates are unacceptable for Xpert Xpress SARS-CoV-2/FLU/RSV testing.  Fact Sheet for Patients: BloggerCourse.comhttps://www.fda.gov/media/152166/download  Fact Sheet for Healthcare Providers: SeriousBroker.ithttps://www.fda.gov/media/152162/download  This test is not yet approved or cleared by the [place] FDA and has been authorized for detection and/or diagnosis of SARS-CoV-2 by FDA under an Emergency Use Authorization (EUA). This EUA will remain in effect (meaning this test can be used) for the duration of the COVID-19 declaration under Section 564(b)(1) of the Act, 21 U.S.C. section 360bbb-3(b)(1), unless the authorization is terminated or revoked.  Performed at [facility], [address] [place], [place] [address]   Culture, blood (routine x 2) Call MD if unable to obtain prior to antibiotics being given     Status: None (Preliminary result)   Collection Time: 06/03/21  8:58 AM   Specimen: BLOOD RIGHT FOREARM  Result Value Ref Range  Status   Specimen Description BLOOD RIGHT FOREARM  Final   Special Requests   Final    BOTTLES DRAWN AEROBIC AND ANAEROBIC Blood Culture adequate volume   Culture   Final    NO GROWTH 2 DAYS Performed at [facility], [address] [place], [place] [address]    Report Status PENDING  Incomplete  Culture, blood (routine x 2) Call MD if unable to obtain prior to antibiotics being given     Status: None (Preliminary result)   Collection Time: 06/03/21  8:59 AM   Specimen: BLOOD RIGHT HAND  Result Value Ref Range Status   Specimen Description BLOOD RIGHT HAND  Final   Special Requests   Final    BOTTLES DRAWN AEROBIC AND ANAEROBIC Blood Culture adequate volume   Culture   Final    NO GROWTH 2 DAYS Performed at [facility], [address] [place], [place]  [address]    Report Status PENDING  Incomplete      Radiology Studies: No results found.   Pamella Pert, MD, PhD [facility]  Between 7 am - 7 pm I am available, please contact me via Amion (for emergencies) or Securechat (non urgent messages)  Between 7 pm - 7 am I am not available, please contact night coverage MD/APP via Amion

## 2021-06-05 NOTE — Progress Notes (Signed)
Mobility Specialist Progress Note: ? ? 06/05/21 1050  ?Mobility  ?Activity  ?(bed exercises)  ?Range of Motion/Exercises Active;Passive;All extremities  ?Assistive Device None  ?Activity Response Tolerated fair  ?$Mobility charge 1 Mobility  ? ?Pt required encouragement to participate in mobility this am. Declined OOB d/t no specified reason "I just dont feel right today'. Performed multiple bed level exercises. Pt left in bed with all needs met. ? ?Nelta Numbers ?Acute Rehab ?Phone: [phone] ?Office Phone: [phone] ? ?

## 2021-06-05 NOTE — Progress Notes (Signed)
Patient extremely agitated, forgetting that she is in the hospital and acutely ill. At time of writing patient is oriented to her name but not her birthday. Reorientation and redirection attempts have failed. Refusing all PO medications and assessment. ?

## 2021-06-05 NOTE — Progress Notes (Signed)
ANTICOAGULATION CONSULT NOTE - Initial Consult ? ?Pharmacy Consult for IV heparin ?Indication: pulmonary embolus ? ?Allergies  ?Allergen Reactions  ? Aricept [Donepezil] Nausea And Vomiting  ? ? ?Patient Measurements: ?Height: 5\' 4"  (162.6 cm) ?Weight: 52 kg (114 lb 10.2 oz) ?IBW/kg (Calculated) : 54.7 ?Heparin Dosing Weight: 50 kg ? ?Vital Signs: ?Temp: 98 ?F (36.7 ?C) (03/04 1112) ?Temp Source: Oral (03/04 1112) ?BP: 121/76 (03/04 1112) ?Pulse Rate: 66 (03/04 1112) ? ?Labs: ?Recent Labs  ?  06/02/21 ?2304 06/03/21 ?0224 06/04/21 ?[id number] 06/05/21 ?0231 06/05/21 ?1225  ?HGB 11.9*  --  10.0* 10.3*  --   ?HCT 34.0*  --  28.1* 29.7*  --   ?PLT 348  --  292 295  --   ?HEPARINUNFRC  --   --   --  0.22* 0.27*  ?CREATININE 0.55  --  0.51  --   --   ?TROPONINIHS 8 3  --   --   --   ? ? ? ?Estimated Creatinine Clearance: 37.6 mL/min (by C-G formula based on SCr of 0.51 mg/dL). ? ? ?Medical History: ?Past Medical History:  ?Diagnosis Date  ? Arthritis   ? Sjogren's disease (HCC)   ? ?Assessment: ?This patient had a PE at Christmas and treatment was never finished due to a fall on her hip, after which she developed a hip hematoma in January 2023. Due to the hematoma, anticoag was discontinued at that time. Per Dr. Magnus IvanBlackman, it is ok to put this patient back on anticoagulation to treat suspected PE given hx. ?Patient currently on IV heparin 800 units/hr. Heparin level came back subtherapeutic at 0.27. Overnight, there were issues with the infusion, and the line re-established at 0425.  Per RN, there have been no further issues with the line or s/sx of bleeding. H/H low but stable. Plt 295.  ? ?Goal of Therapy:  ?Heparin level 0.3-0.7 units/ml ?Monitor platelets by anticoagulation protocol: Yes ?  ?Plan:  ?Increase  heparin infusion to 900 units/hr ?Heparin level 8 hours ?Daily heparin level and CBC while on therapy ?F/u transistion to Eliquis  ? ?Donnetta Hailaylor E Ahmia Colford ?06/05/2021,1:19 PM ? ? ?

## 2021-06-06 LAB — CBC
HCT: 31.6 % — ABNORMAL LOW (ref 36.0–46.0)
Hemoglobin: 11.2 g/dL — ABNORMAL LOW (ref 12.0–15.0)
MCH: 30.2 pg (ref 26.0–34.0)
MCHC: 35.4 g/dL (ref 30.0–36.0)
MCV: 85.2 fL (ref 80.0–100.0)
Platelets: 307 10*3/uL (ref 150–400)
RBC: 3.71 MIL/uL — ABNORMAL LOW (ref 3.87–5.11)
RDW: 13.8 % (ref 11.5–15.5)
WBC: 7.3 10*3/uL (ref 4.0–10.5)
nRBC: 0 % (ref 0.0–0.2)

## 2021-06-06 LAB — HEPARIN LEVEL (UNFRACTIONATED): Heparin Unfractionated: 0.4 IU/mL (ref 0.30–0.70)

## 2021-06-06 LAB — BASIC METABOLIC PANEL
Anion gap: 9 (ref 5–15)
BUN: 7 mg/dL — ABNORMAL LOW (ref 8–23)
CO2: 23 mmol/L (ref 22–32)
Calcium: 8.8 mg/dL — ABNORMAL LOW (ref 8.9–10.3)
Chloride: 106 mmol/L (ref 98–111)
Creatinine, Ser: 0.53 mg/dL (ref 0.44–1.00)
GFR, Estimated: >60 mL/min (ref >60)
Glucose, Bld: 94 mg/dL (ref 70–99)
Potassium: 3.4 mmol/L — ABNORMAL LOW (ref 3.5–5.1)
Sodium: 138 mmol/L (ref 135–145)

## 2021-06-06 MED ORDER — HALOPERIDOL LACTATE 5 MG/ML IJ SOLN
2.0000 mg | Freq: Once | INTRAMUSCULAR | Status: AC | PRN
  Administered 2021-06-06: 2 mg via INTRAVENOUS
  Filled 2021-06-06: qty 1

## 2021-06-06 MED ORDER — POTASSIUM CHLORIDE CRYS ER 20 MEQ PO TBCR
40.0000 meq | EXTENDED_RELEASE_TABLET | Freq: Once | ORAL | Status: AC
  Administered 2021-06-06: 40 meq via ORAL
  Filled 2021-06-06: qty 2

## 2021-06-06 MED ORDER — APIXABAN 5 MG PO TABS
5.0000 mg | ORAL_TABLET | Freq: Two times a day (BID) | ORAL | Status: DC
  Administered 2021-06-06: 5 mg via ORAL
  Filled 2021-06-06: qty 1

## 2021-06-06 MED ORDER — APIXABAN 5 MG PO TABS: 5.0000 mg | ORAL_TABLET | Freq: Two times a day (BID) | ORAL | 0 refills | Status: DC

## 2021-06-06 MED ORDER — AMOXICILLIN-POT CLAVULANATE 875-125 MG PO TABS: 1.0000 | ORAL_TABLET | Freq: Two times a day (BID) | ORAL | 0 refills | Status: AC

## 2021-06-06 NOTE — Progress Notes (Signed)
Patient discharged: Home with family.  Left via: Wheelchair  Discharge paperwork reviewed and given: to patient and family. Teach back completed. IV and telemetry disconnected. Belongings given to patient.  

## 2021-06-06 NOTE — Discharge Summary (Signed)
Physician Discharge Summary  Connie Snyder ZOX:[id number] DOB: December 18, 1927 DOA: 06/02/2021  PCP: Georgianne Fick, MD  Admit date: 06/02/2021 Discharge date: 06/06/2021  Admitted From: home Disposition:  home  Recommendations for Outpatient Follow-up:  Follow up with PCP in 1-2 weeks Please obtain BMP/CBC in one week  Home Health: PT Equipment/Devices: none  Discharge Condition: stable CODE STATUS: Full code Diet recommendation: regular  HPI: Per admitting MD, Connie Snyder is a 88 y.o. African-American female with medical history significant for Sjogren's syndrome, PE, nonalcoholic fatty liver disease, vascular dementia without behavioral changes, rheumatoid arthritis and chronic anemia, presented to emergency room with acute onset of syncope last evening.  The patient was apparently having midsternal chest pain with associated vomiting before she passed out.  She remained unconscious for about 5 minutes.  She was later noted to be coughing and admitted to the expectoration of sputum.  No significant dyspnea or wheezing.  No reported dysuria, oliguria or hematuria, urgency or frequency or flank pain.  No reported fever or chills.  The patient is very poor historian being demented and her son is giving most of the history earlier.  Hospital Course / Discharge diagnoses: Principal Problem:   Chest pain Active Problems:   Syncope, vasovagal   Aspiration pneumonia (HCC)   History of pulmonary embolism   Assessment and Plan: Principal problem Chest pain, syncope, recent PE - patient with recently diagnosed PE on March 28, 2021.  She was admitted in January 2023 after ground-level fall complicated by right hip hematoma and acute blood loss anemia.  During the hospitalization it was decided for Eliquis to be held. She saw orthopedic surgery in office in Feb and ortho mentioned that she can resume blood thinners per PCP, however her eliquis was not resumed.  Here, LE Doppler  negative for DVT, however CT angiogram currently negative for large PE but shows "right lower lobe subsegmental pulmonary embolus not excluded", and in addition a possible area of pulmonary infarction "Indeterminate oval subpleural right lower lobe 2.5 x 0.9 cm nodular-like density. Finding could represent atelectasis. Differential diagnosis includes a pulmonary infarction".  She underwent a ED echo which showed normal EF, normal RV, moderately elevated PA pressure, and grade 1 diastolic dysfunction.  A CT scan of the hip showed stable hematoma.  Dr. Magnus Ivan with orthopedic surgery reviewed the case, and recommended that she can be resumed on blood thinners.  Given the fact that her acute PE in December was only treated but only for 2 weeks, after discussing risks/benefits she was placed on heparin, maintained on IV anticoagulation and has remained stable, hemoglobin improving and she has no further evidence of an ongoing bleed.  She was transitioned to Eliquis and discharged home in stable condition, advised follow-up with PCP within a week.   Active problems Aspiration pneumonia (HCC) -continue unasyn, total of 5 days, transition to Augmentin on discharge Anemia - of chronic disease, no apparent bleeding. Closely monitor on blood thinners Dementia - without behavioral disturbances History of Sjogrens - outpatient management  Sepsis ruled out   Discharge Instructions   Allergies as of 06/06/2021       Reactions   Aricept [donepezil] Nausea And Vomiting        Medication List     STOP taking these medications    celecoxib 100 MG capsule Commonly known as: CELEBREX       TAKE these medications    acetaminophen 650 MG CR tablet Commonly known as: TYLENOL Take 1,300 mg  by mouth in the morning and at bedtime.   amoxicillin-clavulanate 875-125 MG tablet Commonly known as: Augmentin Take 1 tablet by mouth every 12 (twelve) hours for 2 days.   apixaban 5 MG Tabs tablet Commonly  known as: ELIQUIS Take 1 tablet (5 mg total) by mouth 2 (two) times daily.   Aspirin Low Dose 81 MG EC tablet Generic drug: aspirin Take 162 mg by mouth daily.   docusate sodium 100 MG capsule Commonly known as: COLACE Take 100 mg by mouth 2 (two) times daily.   feeding supplement Liqd Take 1 Container by mouth 2 (two) times daily between meals.   ferrous sulfate 325 (65 FE) MG tablet Take 325 mg by mouth 2 (two) times daily with a meal.   hydrOXYzine 25 MG tablet Commonly known as: ATARAX Take 25 mg by mouth 2 (two) times daily.   mirtazapine 30 MG tablet Commonly known as: REMERON Take 30 mg by mouth at bedtime.   Multivitamin Women 50+ Tabs Take 1 tablet by mouth daily with breakfast.   Vitamin D-3 25 MCG (1000 UT) Caps Take 1,000 Units by mouth daily.        Consultations: Orthopedic surgery   Procedures/Studies:  DG Chest 2 View  Result Date: 06/03/2021 CLINICAL DATA:  Chest pain. EXAM: CHEST - 2 VIEW COMPARISON:  11/07/2018. FINDINGS: The heart is enlarged. There is atherosclerotic calcification of the aorta. There is rightward shift of the mid trachea. Fullness of the right hilum is noted. Lung volumes are low. No consolidation, effusion, or pneumothorax. There is a chronic fracture deformity of the proximal left humerus. No acute osseous abnormality is seen. IMPRESSION: 1. Fullness of the right hilum and rightward shift of the trachea which is new from the previous exam. CT is recommended for further evaluation to exclude the possibility of underlying mass. 2. Cardiomegaly. Electronically Signed   By: Thornell Sartorius M.D.   On: 06/03/2021 00:13   CT HEAD WO CONTRAST ( )  Result Date: 06/03/2021 CLINICAL DATA:  Syncope/presyncope. EXAM: CT HEAD WITHOUT CONTRAST TECHNIQUE: Contiguous axial images were obtained from the base of the skull through the vertex without intravenous contrast. RADIATION DOSE REDUCTION: This exam was performed according to the departmental  dose-optimization program which includes automated exposure control, adjustment of the mA and/or kV according to patient size and/or use of iterative reconstruction technique. COMPARISON:  04/26/2021. FINDINGS: Brain: No acute intracranial hemorrhage, midline shift or mass effect. No extra-axial fluid collection. Diffuse atrophy is noted. Subcortical and periventricular white matter hypodensities are noted bilaterally. No hydrocephalus. Vascular: There is atherosclerotic calcification of the carotid siphons. No hyperdense vessel. Skull: Normal. Negative for fracture or focal lesion. Sinuses/Orbits: No acute finding. Other: None. IMPRESSION: 1. No acute intracranial process. 2. Atrophy with chronic microvascular ischemic changes. Electronically Signed   By: Thornell Sartorius M.D.   On: 06/03/2021 01:56   CT Chest W Contrast  Result Date: 06/03/2021 CLINICAL DATA:  Pleural effusion, malignancy suspected. EXAM: CT CHEST WITH CONTRAST TECHNIQUE: Multidetector CT imaging of the chest was performed during intravenous contrast administration. RADIATION DOSE REDUCTION: This exam was performed according to the departmental dose-optimization program which includes automated exposure control, adjustment of the mA and/or kV according to patient size and/or use of iterative reconstruction technique. CONTRAST:  75mL OMNIPAQUE IOHEXOL 300 MG/ML  SOLN COMPARISON:  11/08/2018. FINDINGS: Cardiovascular: The heart is normal in size and multi-vessel coronary artery calcifications are present. There is atherosclerotic calcification of the aorta without evidence of aneurysm. The pulmonary trunk  is distended which may be associated with underlying pulmonary artery hypertension. Mediastinum/Nodes: No mediastinal, hilar, or axillary lymphadenopathy. The thyroid gland is enlarged with multiple nodules measuring up to 2.6 cm on the left resulting in rightward shift of the trachea. The esophagus is within normal limits. Lungs/Pleura: Apical  pleural thickening or scarring is noted on the left. Strandy atelectasis is noted bilaterally. There is mild airspace disease in the right lower lobe. No effusion or pneumothorax. No abnormality is seen in the region of the right hilum and previous findings were likely related to overlapping densities. Upper Abdomen: There is a cyst in the posterior right liver measuring 4.4 cm. Musculoskeletal: An old rib fracture is noted on the right. There are degenerative changes in the thoracic spine. There is a fracture of the sternum with bone callus formation. Multiple compression deformities are present in the thoracic spine at T4, T5, T7, T9, T11, T12, and L1. IMPRESSION: 1. No evidence of pulmonary nodule or mass. 2. Strandy atelectasis in the lungs bilaterally with mild airspace disease at the right lung base, possible atelectasis or infiltrate. 3. Aortic atherosclerosis. 4. Multiple compression deformities in the thoracolumbar spine which are indeterminate in age. Electronically Signed   By: Thornell Sartorius M.D.   On: 06/03/2021 02:07   CT Angio Chest Pulmonary Embolism (PE) W or WO Contrast  Result Date: 06/03/2021 CLINICAL DATA:  Pulmonary embolism (PE) suspected, high prob. patient was eating, has episode of emesis, LOC. EXAM: CT ANGIOGRAPHY CHEST WITH CONTRAST TECHNIQUE: Multidetector CT imaging of the chest was performed using the standard protocol during bolus administration of intravenous contrast. Multiplanar CT image reconstructions and MIPs were obtained to evaluate the vascular anatomy. RADIATION DOSE REDUCTION: This exam was performed according to the departmental dose-optimization program which includes automated exposure control, adjustment of the mA and/or kV according to patient size and/or use of iterative reconstruction technique. CONTRAST:  OMNIPAQUE IOHEXOL 350 MG/ML SOLN COMPARISON:  CT angiography chest 11/08/2018 FINDINGS: Cardiovascular: Satisfactory opacification of the pulmonary  arteries to the segmental level. No evidence of pulmonary embolism. A right lower lobe subsegmental pulmonary embolus is not excluded. The main pulmonary artery is enlarged measuring up to 3.1 cm. Normal heart size. No significant pericardial effusion. The thoracic aorta is normal in caliber. Mild atherosclerotic plaque of the thoracic aorta. Three-vessel coronary artery calcifications. Query aortic valve leaflet calcifications. Mediastinum/Nodes: No enlarged mediastinal, hilar, or axillary lymph nodes. Thyroid gland, trachea, and esophagus demonstrate no significant findings. Lungs/Pleura: Mild centrilobular emphysematous changes. The left lower lobe linear atelectasis versus scarring. Right lower lobe linear atelectasis versus scarring. No focal consolidation. Oval subpleural right lower lobe 2.5 x 0.9 cm nodular-like density. No pulmonary mass. No pleural effusion. No pneumothorax. Upper Abdomen: There is a 4.1 cm fluid density lesion within the right hepatic lobe that likely represents a simple renal cyst. No acute abnormality. Musculoskeletal: Exophytic left chest wall 2.1 x 1.3 cm lesion (6:254). No suspicious lytic or blastic osseous lesions. Interval development of a subacute to chronic half shaft width displaced mid sternal body fracture. Redemonstration of a chronic similar-appearing T12 compression fracture. Chronic similar-appearing L1 compression fracture. Partially visualized known L2 compression fracture. Similar-appearing vertebral body by the height loss of the T8 vertebral body. Interval development of an age-indeterminate T11 incomplete burst fracture with greater than 75% height loss and 3 mm retropulsion. Interval development of a T10 compression fracture with greater than 20% height loss. Interval development of a T9 compression fracture with greater than 65% height loss.  Interval development of a compression fracture of the T7 vertebral body with at least 40% height loss. Interval worsening of  vertebral body height loss and compression fracture of the T5 vertebral body with greater than 45% height loss. Similar-appearing T4 compression fracture. Interval development of compression fracture/vertebral body height loss of the T1 through T3 levels. Multilevel degenerative changes of the spine. Degenerative changes of bilateral shoulders. Query healing partially visualized left shoulder fracture. Old healed right rib fractures. Review of the MIP images confirms the above findings. IMPRESSION: 1. No segmental or central pulmonary embolus. Limited evaluation of the subsegmental level with a right lower lobe subsegmental pulmonary embolus not excluded. 2. Indeterminate oval subpleural right lower lobe 2.5 x 0.9 cm nodular-like density. Finding could represent atelectasis. Differential diagnosis includes a pulmonary infarction. Consider follow-up CT in 3 months. 3. Enlarged main pulmonary artery suggests pulmonary hypertension. 4. Interval development of a subacute to chronic half shaft width displaced mid sternal body fracture. 5. Interval development of an age-indeterminate T11 incomplete burst fracture with greater than 75% height loss and 3 mm retropulsion. Recommend correlation with tenderness to palpation to evaluate for an acute component. 6. Interval development of multiple new age-indeterminate compression fractures/vertebral body height loss involving the T3, T4, T5, T7, T9, T10 levels. Similar-appearing compression fractures of the T8, T12, L1 vertebral bodies. Partially visualized known L2 compression fracture. Recommend correlation with tenderness to palpation to evaluate for an acute component. 7. Exophytic left chest wall 2.1 x 1.3 cm lesion. Correlate with physical exam. 8. Aortic Atherosclerosis (ICD10-I70.0) including three-vessel coronary artery and aortic valve leaflet calcifications. Correlate with aortic stenosis. Electronically Signed   By: Tish Frederickson M.D.   On: 06/03/2021 15:40   CT  HIP RIGHT WO CONTRAST  Result Date: 06/03/2021 CLINICAL DATA:  Persistent right hip pain since fall in January. EXAM: CT OF THE RIGHT HIP WITHOUT CONTRAST TECHNIQUE: Multidetector CT imaging of the right hip was performed according to the standard protocol. Multiplanar CT image reconstructions were also generated. RADIATION DOSE REDUCTION: This exam was performed according to the departmental dose-optimization program which includes automated exposure control, adjustment of the mA and/or kV according to patient size and/or use of iterative reconstruction technique. COMPARISON:  Right hip CT dated April 26, 2021. FINDINGS: Bones/Joint/Cartilage No acute fracture or dislocation. Old fractures of the right puboacetabular junction and inferior pubic ramus again noted. 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 Chronic L5 vertebral body and sacral fractures again noted as well. The right hip joint space is relatively preserved. No joint effusion. Similar severe osteopenia. Ligaments Ligaments are suboptimally evaluated by CT. Muscles and Tendons Grossly intact. Soft tissue Large mixed density hematoma overlying the lateral right hip superficial fascia currently measures 9.2 x 5.3 x at least 18.3 cm (AP by transverse by CC), previously 10.2 x 5.3 x at least 16.3 cm. No soft tissue mass. IMPRESSION: 1. Similar large superficial mixed density hematoma overlying the lateral right hip measuring 9.2 x 5.3 x at least 18.3 cm. This could represent a degloving injury (Morel-Lavallee lesion). 2. No acute fracture. 3. Similar chronic right pelvic and lumbosacral fractures. Electronically Signed   By: Obie Dredge M.D.   On: 06/03/2021 12:32   ECHOCARDIOGRAM COMPLETE  Result Date: 06/03/2021    ECHOCARDIOGRAM REPORT   Patient Name:   Connie Snyder [facility] Date of Exam: 06/03/2021 Medical Rec #:  [id number]       Height:       64.0 in Accession #:  [id number]      Weight:       119.0 lb Date of  Birth:  [birth year]      BSA:          1.569 m Patient Age:    89 years        BP:           122/62 mmHg Patient Gender: F               HR:           64 bpm. Exam Location:  Inpatient Procedure: 2D Echo Indications:    Syncope  History:        Patient has no prior history of Echocardiogram examinations.  Sonographer:    Devonne Doughty Referring Phys: [id number] JAN A MANSY IMPRESSIONS  1. Left ventricular ejection fraction, by estimation, is 60 to 65%. The left ventricle has normal function. The left ventricle has no regional wall motion abnormalities. There is moderate asymmetric left ventricular hypertrophy of the basal-septal segment. Left ventricular diastolic parameters are consistent with Grade I diastolic dysfunction (impaired relaxation).  2. Right ventricular systolic function is normal. The right ventricular size is normal. There is moderately elevated pulmonary artery systolic pressure. The estimated right ventricular systolic pressure is 55.9 mmHg.  3. The mitral valve is abnormal. Mild to moderate mitral valve regurgitation.  4. Tricuspid valve regurgitation is moderate.  5. The aortic valve is tricuspid. Aortic valve regurgitation is trivial. Aortic valve sclerosis/calcification is present, without any evidence of aortic stenosis. Aortic valve mean gradient measures 13.0 mmHg.  6. The inferior vena cava is normal in size with <50% respiratory variability, suggesting right atrial pressure of 8 mmHg. Comparison(s): No prior Echocardiogram. FINDINGS  Left Ventricle: Left ventricular ejection fraction, by estimation, is 60 to 65%. The left ventricle has normal function. The left ventricle has no regional wall motion abnormalities. The left ventricular internal cavity size was normal in size. There is  moderate asymmetric left ventricular hypertrophy of the basal-septal segment. Left ventricular diastolic parameters are consistent with Grade I diastolic dysfunction (impaired relaxation). Indeterminate filling  pressures. Right Ventricle: The right ventricular size is normal. No increase in right ventricular wall thickness. Right ventricular systolic function is normal. There is moderately elevated pulmonary artery systolic pressure. The tricuspid regurgitant velocity is 3.46 m/s, and with an assumed right atrial pressure of 8 mmHg, the estimated right ventricular systolic pressure is 55.9 mmHg. Left Atrium: Left atrial size was normal in size. Right Atrium: Right atrial size was normal in size. Pericardium: There is no evidence of pericardial effusion. Mitral Valve: The mitral valve is abnormal. There is mild thickening of the mitral valve leaflet(s). There is moderate calcification of the anterior and posterior mitral valve leaflet(s). Mild to moderate mitral valve regurgitation. MV peak gradient, 10.9 mmHg. The mean mitral valve gradient is 4.0 mmHg. Tricuspid Valve: The tricuspid valve is grossly normal. Tricuspid valve regurgitation is moderate. Aortic Valve: The aortic valve is tricuspid. Aortic valve regurgitation is trivial. Aortic valve sclerosis/calcification is present, without any evidence of aortic stenosis. Aortic valve mean gradient measures 13.0 mmHg. Aortic valve peak gradient measures 23.5 mmHg. Aortic valve area, by VTI measures 2.56 cm. Pulmonic Valve: The pulmonic valve was grossly normal. Pulmonic valve regurgitation is trivial. Aorta: The aortic root and ascending aorta are structurally normal, with no evidence of dilitation. Venous: The inferior vena cava is normal in size with less than 50% respiratory variability, suggesting right atrial pressure of 8 mmHg. IAS/Shunts:  No atrial level shunt detected by color flow Doppler.  LEFT VENTRICLE PLAX 2D LVIDd:         2.40 cm   Diastology LVIDs:         1.60 cm   LV e' medial:    4.90 cm/s LV PW:         1.10 cm   LV E/e' medial:  18.2 LV IVS:        1.60 cm   LV e' lateral:   6.42 cm/s LVOT diam:     2.00 cm   LV E/e' lateral: 13.9 LV SV:         121 LV  SV Index:   77 LVOT Area:     3.14 cm  RIGHT VENTRICLE             IVC RV Basal diam:  3.50 cm     IVC diam: 1.40 cm RV Mid diam:    3.30 cm RV S prime:     12.30 cm/s TAPSE (M-mode): 2.2 cm LEFT ATRIUM             Index        RIGHT ATRIUM           Index LA diam:        3.40 cm 2.17 cm/m   RA Area:     14.70 cm LA Vol (A2C):   35.5 ml 22.62 ml/m  RA Volume:   33.70 ml  21.48 ml/m LA Vol (A4C):   48.7 ml 31.03 ml/m LA Biplane Vol: 44.8 ml 28.55 ml/m  AORTIC VALVE AV Area (Vmax):    2.45 cm AV Area (Vmean):   2.44 cm AV Area (VTI):     2.56 cm AV Vmax:           242.50 cm/s AV Vmean:          163.000 cm/s AV VTI:            0.472 m AV Peak Grad:      23.5 mmHg AV Mean Grad:      13.0 mmHg LVOT Vmax:         189.00 cm/s LVOT Vmean:        126.500 cm/s LVOT VTI:          0.384 m LVOT/AV VTI ratio: 0.82  AORTA Ao Root diam: 3.00 cm Ao Asc diam:  3.40 cm MITRAL VALVE                TRICUSPID VALVE MV Area (PHT): 2.91 cm     TR Peak grad:   47.9 mmHg MV Area VTI:   3.03 cm     TR Vmax:        346.00 cm/s MV Peak grad:  10.9 mmHg MV Mean grad:  4.0 mmHg     SHUNTS MV Vmax:       1.65 m/s     Systemic VTI:  0.38 m MV Vmean:      93.4 cm/s    Systemic Diam: 2.00 cm MV Decel Time: 261 msec MV E velocity: 89.30 cm/s MV A velocity: 129.00 cm/s MV E/A ratio:  0.69 Zoila Shutter MD Electronically signed by Zoila Shutter MD Signature Date/Time: 06/03/2021/11:21:23 AM    Final    VAS [place] LOWER EXTREMITY VENOUS (DVT)  Result Date: 06/03/2021  Lower Venous DVT Study Patient Name:  Connie Snyder [facility]  Date of Exam:   06/03/2021 Medical Rec #: [id number]  Accession #:    [id number] Date of Birth: [birth year]       Patient Gender: F Patient Age:   29 years Exam Location:  [facility] Procedure:      VAS [place] LOWER EXTREMITY VENOUS (DVT) Referring Phys: Pamella Pert --------------------------------------------------------------------------------  Indications: Swelling, and Edema.  Comparison Study: no prior Performing  Technologist: [place] Ponder RVS  Examination Guidelines: A complete evaluation includes B-mode imaging, spectral Doppler, color Doppler, and power Doppler as needed of all accessible portions of each vessel. Bilateral testing is considered an integral part of a complete examination. Limited examinations for reoccurring indications may be performed as noted. The reflux portion of the exam is performed with the patient in reverse Trendelenburg.  +---------+---------------+---------+-----------+----------+--------------+  RIGHT     Compressibility Phasicity Spontaneity Properties Thrombus Aging  +---------+---------------+---------+-----------+----------+--------------+  CFV       Full            Yes       Yes                                    +---------+---------------+---------+-----------+----------+--------------+  SFJ       Full                                                             +---------+---------------+---------+-----------+----------+--------------+  FV Prox   Full                                                             +---------+---------------+---------+-----------+----------+--------------+  FV Mid    Full                                                             +---------+---------------+---------+-----------+----------+--------------+  FV Distal Full                                                             +---------+---------------+---------+-----------+----------+--------------+  PFV       Full                                                             +---------+---------------+---------+-----------+----------+--------------+  POP       Full            Yes       Yes                                    +---------+---------------+---------+-----------+----------+--------------+  PTV       Full                                                             +---------+---------------+---------+-----------+----------+--------------+  PERO      Full                                                              +---------+---------------+---------+-----------+----------+--------------+   +---------+---------------+---------+-----------+----------+--------------+  LEFT      Compressibility Phasicity Spontaneity Properties Thrombus Aging  +---------+---------------+---------+-----------+----------+--------------+  CFV       Full            Yes       Yes                                    +---------+---------------+---------+-----------+----------+--------------+  SFJ       Full                                                             +---------+---------------+---------+-----------+----------+--------------+  FV Prox   Full                                                             +---------+---------------+---------+-----------+----------+--------------+  FV Mid    Full                                                             +---------+---------------+---------+-----------+----------+--------------+  FV Distal Full                                                             +---------+---------------+---------+-----------+----------+--------------+  PFV       Full                                                             +---------+---------------+---------+-----------+----------+--------------+  POP       Full            Yes       Yes                                    +---------+---------------+---------+-----------+----------+--------------+  PTV       Full                                                             +---------+---------------+---------+-----------+----------+--------------+  PERO      Full                                                             +---------+---------------+---------+-----------+----------+--------------+     Summary: BILATERAL: - No evidence of deep vein thrombosis seen in the lower extremities, bilaterally. -No evidence of popliteal cyst, bilaterally.   *See table(s) above for measurements and observations. Electronically signed by Heath Lark on  06/03/2021 at 3:48:44 PM.    Final      Subjective: - no chest pain, shortness of breath, no abdominal pain, nausea or vomiting.   Discharge Exam: BP 99/69 (BP Location: Right Arm)    Pulse 66    Temp 98.8 F (37.1 C) (Oral)    Resp 17    Ht 5\' 4"  (1.626 m)    Wt 51 kg    SpO2 98%    BMI 19.30 kg/m   General: Pt is alert, awake, not in acute distress Cardiovascular: RRR, S1/S2 +, no rubs, no gallops Respiratory: CTA bilaterally, no wheezing, no rhonchi Abdominal: Soft, NT, ND, bowel sounds + Extremities: no edema, no cyanosis    The results of significant diagnostics from this hospitalization (including imaging, microbiology, ancillary and laboratory) are listed below for reference.     Microbiology: Recent Results (from the past 240 hour(s))  Resp Panel by RT-PCR (Flu A&B, Covid) Nasopharyngeal Swab     Status: None   Collection Time: 06/03/21  7:53 AM   Specimen: Nasopharyngeal Swab; Nasopharyngeal(NP) swabs in vial transport medium  Result Value Ref Range Status   SARS Coronavirus 2 by RT PCR NEGATIVE NEGATIVE Final    Comment: (NOTE) SARS-CoV-2 target nucleic acids are NOT DETECTED.  The SARS-CoV-2 RNA is generally detectable in upper respiratory specimens during the acute phase of infection. The lowest concentration of SARS-CoV-2 viral copies this assay can detect is 138 copies/mL. A negative result does not preclude SARS-Cov-2 infection and should not be used as the sole basis for treatment or other patient management decisions. A negative result may occur with  improper specimen collection/handling, submission of specimen other than nasopharyngeal swab, presence of viral mutation(s) within the areas targeted by this assay, and inadequate number of viral copies(<138 copies/mL). A negative result must be combined with clinical observations, patient history, and epidemiological information. The expected result is Negative.  Fact Sheet for Patients:   BloggerCourse.com  Fact Sheet for Healthcare Providers:  SeriousBroker.it  This test is no t yet approved or cleared by the [place] FDA and  has been authorized for detection and/or diagnosis of SARS-CoV-2 by FDA under an Emergency Use Authorization (EUA). This EUA will remain  in effect (meaning this test can be used) for the duration of the COVID-19 declaration under Section 564(b)(1) of the Act, 21 U.S.C.section 360bbb-3(b)(1), unless the authorization is terminated  or revoked sooner.       Influenza  A by PCR NEGATIVE NEGATIVE Final   Influenza B by PCR NEGATIVE NEGATIVE Final    Comment: (NOTE) The Xpert Xpress SARS-CoV-2/FLU/RSV plus assay is intended as an aid in the diagnosis of influenza from Nasopharyngeal swab specimens and should not be used as a sole basis for treatment. Nasal washings and aspirates are unacceptable for Xpert Xpress SARS-CoV-2/FLU/RSV testing.  Fact Sheet for Patients: BloggerCourse.com  Fact Sheet for Healthcare Providers: SeriousBroker.it  This test is not yet approved or cleared by the [place] FDA and has been authorized for detection and/or diagnosis of SARS-CoV-2 by FDA under an Emergency Use Authorization (EUA). This EUA will remain in effect (meaning this test can be used) for the duration of the COVID-19 declaration under Section 564(b)(1) of the Act, 21 U.S.C. section 360bbb-3(b)(1), unless the authorization is terminated or revoked.  Performed at [facility], [address], [place], [place] [address]   Culture, blood (routine x 2) Call MD if unable to obtain prior to antibiotics being given     Status: None (Preliminary result)   Collection Time: 06/03/21  8:58 AM   Specimen: BLOOD RIGHT FOREARM  Result Value Ref Range Status   Specimen Description BLOOD RIGHT FOREARM  Final   Special Requests   Final    BOTTLES  DRAWN AEROBIC AND ANAEROBIC Blood Culture adequate volume   Culture   Final    NO GROWTH 2 DAYS Performed at [facility], [address] [place], [place] [address]    Report Status PENDING  Incomplete  Culture, blood (routine x 2) Call MD if unable to obtain prior to antibiotics being given     Status: None (Preliminary result)   Collection Time: 06/03/21  8:59 AM   Specimen: BLOOD RIGHT HAND  Result Value Ref Range Status   Specimen Description BLOOD RIGHT HAND  Final   Special Requests   Final    BOTTLES DRAWN AEROBIC AND ANAEROBIC Blood Culture adequate volume   Culture   Final    NO GROWTH 2 DAYS Performed at [facility], [address], [place], [place] [address]    Report Status PENDING  Incomplete     Labs: Basic Metabolic Panel: Recent Labs  Lab 06/02/21 2304 06/04/21 0333 06/06/21 0728  NA 135 136 138  K 4.1 3.7 3.4*  CL 102 106 106  CO2 22 24 23   GLUCOSE 102* 84 94  BUN 17 11 7*  CREATININE 0.55 0.51 0.53  CALCIUM 9.2 8.1* 8.8*   Liver Function Tests: No results for input(s): AST, ALT, ALKPHOS, BILITOT, PROT, ALBUMIN in the last 168 hours. CBC: Recent Labs  Lab 06/02/21 2304 06/04/21 0333 06/05/21 0231 06/06/21 0728  WBC 13.6* 5.6 7.1 7.3  HGB 11.9* 10.0* 10.3* 11.2*  HCT 34.0* 28.1* 29.7* 31.6*  MCV 86.7 85.9 85.8 85.2  PLT 348 292 295 307   CBG: Recent Labs  Lab 06/02/21 2330  GLUCAP 98   Hgb A1c No results for input(s): HGBA1C in the last 72 hours. Lipid Profile No results for input(s): CHOL, HDL, LDLCALC, TRIG, CHOLHDL, LDLDIRECT in the last 72 hours. Thyroid function studies No results for input(s): TSH, T4TOTAL, T3FREE, THYROIDAB in the last 72 hours.  Invalid input(s): FREET3 Urinalysis    Component Value Date/Time   COLORURINE YELLOW 06/03/2021 0930   APPEARANCEUR CLEAR 06/03/2021 0930   LABSPEC <1.005 (L) 06/03/2021 0930   PHURINE 7.5 06/03/2021 0930   GLUCOSEU NEGATIVE 06/03/2021 0930   HGBUR NEGATIVE  06/03/2021 0930   BILIRUBINUR NEGATIVE 06/03/2021 0930   KETONESUR NEGATIVE 06/03/2021 0930   PROTEINUR NEGATIVE 06/03/2021 0930   NITRITE NEGATIVE 06/03/2021 0930   LEUKOCYTESUR NEGATIVE 06/03/2021 0930    FURTHER DISCHARGE INSTRUCTIONS:   Get Medicines reviewed and adjusted: Please take all your medications with you for your next visit with your Primary MD   Laboratory/radiological data: Please request your Primary MD to go over all hospital tests and procedure/radiological results at the follow up, please ask your Primary MD to get all Hospital records sent to his/her office.   In some cases, they will be blood work, cultures and biopsy results pending at the time of your discharge. Please request that your primary care M.D. goes through all the records of your hospital data and follows up on these results.   Also Note the following: If you experience worsening of your admission symptoms, develop shortness of breath, life threatening emergency, suicidal or homicidal thoughts you must seek medical attention immediately by calling 911 or calling your MD immediately  if symptoms less severe.   You must read complete instructions/literature along with all the possible adverse reactions/side effects for all the Medicines you take and that have been prescribed to you. Take any new Medicines after you have completely understood and accpet all the possible adverse reactions/side effects.    Do not drive when taking Pain medications or sleeping medications (Benzodaizepines)   Do not take more than prescribed Pain, Sleep and Anxiety Medications. It is not advisable to combine anxiety,sleep and pain medications without talking with your primary care practitioner   Special Instructions: If you have smoked or chewed Tobacco  in the last 2 yrs please stop smoking, stop any regular Alcohol  and or any Recreational drug use.   Wear Seat belts while driving.   Please note: You were cared for by a  hospitalist during your hospital stay. Once you are discharged, your primary care physician will handle any further medical issues. Please note that NO REFILLS for any discharge medications will be authorized once you are discharged, as it is imperative that you return to your primary care physician (or establish a relationship with a primary care physician if you do not have one) for your post hospital discharge needs so that they can reassess your need for medications and monitor your lab values.  Time coordinating discharge: 40 minutes  SIGNED:  Pamella Pert, MD, PhD 06/06/2021, 11:53 AM

## 2021-06-06 NOTE — TOC Initial Note (Signed)
Transition of Care (TOC) - Initial/Assessment Note  ? ? ?Patient Details  ?Name: Connie Snyder ?MRN: [id number] ?Date of Birth: [birth year] ? ?Transition of Care (TOC) CM/SW Contact:    ?Harim Bi G., RN ?Phone Number: ?06/06/2021, 12:30 PM ? ?Clinical Narrative:    ?             88 y.o. African-American female with medical history significant for Sjogren's syndrome, PE, nonalcoholic fatty liver disease, vascular dementia without behavioral changes, rheumatoid arthritis and chronic anemia, presented to emergency room with acute onset of syncope. ?RNCN received HHPT orders and called patient's son to offer choice for [facility] services.  HH services offered multiple times and declined, states he will arrange tomorrow. ? ? ?Expected Discharge Plan: Home/Self Care ?Barriers to Discharge: No Barriers Identified ? ? ?Patient Goals and CMS Choice ?Patient states their goals for this hospitalization and ongoing recovery are:: to go home ?  ?Choice offered to / list presented to : Adult Children ? ?Expected Discharge Plan and Services ?Expected Discharge Plan: Home/Self Care ?  ?Discharge Planning Services: CM Consult ?Post Acute Care Choice: NA ?Living arrangements for the past 2 months: Single Family Home ?Expected Discharge Date: 06/06/21               ?DME Arranged: N/A ?DME Agency: NA ?  ?  ?  ?HH Arranged: Refused HH (RNCM spoke to patient's son.  RNCM offered multiple times to arrange HHPT and patient's son declined services.  Advised will need to arrange post discharge with PCP) ?  ?  ?  ?  ? ?Prior Living Arrangements/Services ?Living arrangements for the past 2 months: Single Family Home ?Lives with:: Adult Children ?Patient language and need for interpreter reviewed:: Yes ?Do you feel safe going back to the place where you live?: Yes      ?Need for Family Participation in Patient Care: Yes (Comment) ?Care giver support system in place?: Yes (comment) ?  ?Criminal Activity/Legal Involvement Pertinent to Current  Situation/Hospitalization: No - Comment as needed ? ?Activities of Daily Living ?  ?  ? ?Permission Sought/Granted ?  ?  ?   ?   ?   ?   ? ?Emotional Assessment ?Appearance:: Appears stated age ?Attitude/Demeanor/Rapport:  (sleeping) ?Affect (typically observed): Unable to Assess ?Orientation: : Fluctuating Orientation (Suspected and/or reported Sundowners) ?Alcohol / Substance Use: Not Applicable ?  ? ?Admission diagnosis:  Chest pain [R07.9] ?Syncope, unspecified syncope type [R55] ?Patient Active Problem List  ? Diagnosis Date Noted  ? Chest pain 06/03/2021  ? Syncope, vasovagal 06/03/2021  ? Aspiration pneumonia (HCC) 06/03/2021  ? History of pulmonary embolism 06/03/2021  ? Pulmonary emboli (HCC) 04/27/2021  ? Thigh hematoma 04/26/2021  ? Acute blood loss anemia 04/26/2021  ? Rheumatoid arthritis (HCC) 04/26/2021  ? Altered mental status 11/08/2018  ? Confusion   ? Vascular dementia without behavioral disturbance (HCC)   ? Normocytic normochromic anemia 12/22/2017  ? S/p left hip fracture 12/22/2017  ? Malnutrition of moderate degree 12/22/2017  ? Closed fracture of left hip (HCC) 12/21/2017  ? Closed fracture of left proximal humerus 11/30/2017  ? ?PCP:  Georgianne Fick, MD ?Pharmacy:   ?[id number] - [place], [place] - [address] [facility] [address] ?[address] ?[place] [address] ?Phone: [phone] Fax: [phone] ? ? ? ? ?Social Determinants of Health (SDOH) Interventions ?  ? ?Readmission Risk Interventions ?No flowsheet data found. ? ? ?

## 2021-06-06 NOTE — Progress Notes (Signed)
ANTICOAGULATION CONSULT NOTE  ?Pharmacy Consult for IV heparin ?Indication: pulmonary embolus ? ?Allergies  ?Allergen Reactions  ? Aricept [Donepezil] Nausea And Vomiting  ? ? ?Patient Measurements: ?Height: 5\' 4"  (162.6 cm) ?Weight: 52 kg (114 lb 10.2 oz) ?IBW/kg (Calculated) : 54.7 ?Heparin Dosing Weight: 50 kg ? ?Vital Signs: ?Temp: 98.1 ?F (36.7 ?C) (03/04 1917) ?Temp Source: Oral (03/04 1917) ?BP: 168/97 (03/04 1917) ?Pulse Rate: 93 (03/04 1917) ? ?Labs: ?Recent Labs  ?  06/03/21 ?0224 06/04/21 ?[id number] 06/05/21 ?0231 06/05/21 ?1225 06/05/21 ?2227  ?HGB  --  10.0* 10.3*  --   --   ?HCT  --  28.1* 29.7*  --   --   ?PLT  --  292 295  --   --   ?HEPARINUNFRC  --   --  0.22* 0.27* 0.43  ?CREATININE  --  0.51  --   --   --   ?TROPONINIHS 3  --   --   --   --   ? ? ? ?Estimated Creatinine Clearance: 37.6 mL/min (by C-G formula based on SCr of 0.51 mg/dL). ? ? ?Medical History: ?Past Medical History:  ?Diagnosis Date  ? Arthritis   ? Sjogren's disease (HCC)   ? ?Assessment: ?This patient had a PE at Christmas and treatment was never finished due to a fall on her hip, after which she developed a hip hematoma in January 2023. Due to the hematoma, anticoag was discontinued at that time. Per Dr. Magnus IvanBlackman, it is ok to put this patient back on anticoagulation to treat suspected PE given hx. ? ?Patient currently on IV heparin 900 units/hr. Heparin level now at goal. No bleeding or infusion issues noted.  ? ?Goal of Therapy:  ?Heparin level 0.3-0.7 units/ml ?Monitor platelets by anticoagulation protocol: Yes ?  ?Plan:  ?Continue heparin infusion at 900 units/hr ?Heparin level 8 hours to confirm ?Daily heparin level and CBC while on therapy ?F/u transistion to Eliquis  ? ?Sheppard CoilFrank Jaecob Lowden PharmD., BCPS ?Clinical Pharmacist ?06/06/2021 1:15 AM ? ?

## 2021-06-06 NOTE — Progress Notes (Signed)
ANTICOAGULATION CONSULT NOTE - Initial Consult ? ?Pharmacy Consult for IV heparin > eliquis ?Indication: pulmonary embolus ? ?Allergies  ?Allergen Reactions  ? Aricept [Donepezil] Nausea And Vomiting  ? ? ?Patient Measurements: ?Height: 5\' 4"  (162.6 cm) ?Weight: 51 kg (112 lb 7 oz) ?IBW/kg (Calculated) : 54.7 ?Heparin Dosing Weight: 50 kg ? ?Vital Signs: ?Temp: 98 ?F (36.7 ?C) (03/05 0536) ?Temp Source: Oral (03/05 0536) ?BP: 132/87 (03/05 0536) ?Pulse Rate: 74 (03/05 0536) ? ?Labs: ?Recent Labs  ?  06/04/21 ?[id number] 06/04/21 ?[id number] 06/05/21 ?0231 06/05/21 ?1225 06/05/21 ?2227 06/06/21 ?[id number]  ?HGB 10.0*  --  10.3*  --   --  11.2*  ?HCT 28.1*  --  29.7*  --   --  31.6*  ?PLT 292  --  295  --   --  307  ?HEPARINUNFRC  --    < > 0.22* 0.27* 0.43 0.40  ?CREATININE 0.51  --   --   --   --  0.53  ? < > = values in this interval not displayed.  ? ? ? ?Estimated Creatinine Clearance: 36.9 mL/min (by C-G formula based on SCr of 0.53 mg/dL). ? ? ?Medical History: ?Past Medical History:  ?Diagnosis Date  ? Arthritis   ? Sjogren's disease ([place])   ? ?Assessment: ?This patient had a PE at Christmas and treatment was never finished due to a fall on her hip, after which she developed a hip hematoma in January 2023. Due to the hematoma, anticoag was discontinued at that time. Per Dr. Ninfa Linden, it is ok to put this patient back on anticoagulation to treat suspected PE given hx. ?Patient currently on IV heparin 900 units/hr. Heparin level came back therapeutic at 0.40.  H/H low but stable. Plt 307. Per Dr. Cruzita Lederer, transition to Eliquis today, at steady dose instead of treating as a new PE. Given the indication is PE, dose of Eliquis 5 mg BID is appropriate in this patient.  ? ?Goal of Therapy:  ?Monitor platelets by anticoagulation protocol: Yes ? ?  ?Plan:  ?D/c heparin infusion to 900 units/hr at 1030. ?Start Eliquis 5mg  BID  ?Give first dose of Eliquis when heparin infusion stopped. ?Monitor for s/sx of bleeding ? ?[place] ?06/06/2021,9:37 AM ? ? ?

## 2021-06-06 NOTE — Consult Note (Signed)
Pharmacy Antibiotic Note ? ?Connie Snyder is a 88 y.o. female admitted on 06/02/2021 with aspiration pneumonia and hx of a PE.  Pharmacy has been consulted for Unasyn dosing. ? ?Plan: ?Pt has been treated with Unasyn and Azithromycin on this admission.  Pt is  set to discharge today on Augmentin 875-125.  ? ?Height: 5\' 4"  (162.6 cm) ?Weight: 51 kg (112 lb 7 oz) ?IBW/kg (Calculated) : 54.7 ? ?Temp (24hrs), Avg:98.3 ?F (36.8 ?C), Min:98 ?F (36.7 ?C), Max:98.8 ?F (37.1 ?C) ? ?Recent Labs  ?Lab 06/02/21 ?2304 06/04/21 ?4098 06/05/21 ?0231 06/06/21 ?1191  ?WBC 13.6* 5.6 7.1 7.3  ?CREATININE 0.55 0.51  --  0.53  ?  ?Estimated Creatinine Clearance: 36.9 mL/min (by C-G formula based on SCr of 0.53 mg/dL).   ? ?Allergies  ?Allergen Reactions  ? Aricept [Donepezil] Nausea And Vomiting  ? ? ?Antimicrobials this admission: ?Unasyn 3g 3/2 >> 3/5 ?Azithromycin 500 mg 3/2 >> 3/5 ? ? ?Microbiology results: ?3/2 BCx: NGTD ? ?Thank you for allowing pharmacy to be a part of this patient?s care. ? ?Weston Settle ?06/06/2021 12:38 PM ? ?

## 2021-06-08 LAB — CULTURE, BLOOD (ROUTINE X 2)
Culture: NO GROWTH
Culture: NO GROWTH
Special Requests: ADEQUATE
Special Requests: ADEQUATE

## 2021-06-11 ENCOUNTER — Emergency Department (HOSPITAL_BASED_OUTPATIENT_CLINIC_OR_DEPARTMENT_OTHER): Payer: Medicare Other

## 2021-06-11 ENCOUNTER — Encounter (HOSPITAL_BASED_OUTPATIENT_CLINIC_OR_DEPARTMENT_OTHER): Payer: Self-pay | Admitting: Emergency Medicine

## 2021-06-11 ENCOUNTER — Other Ambulatory Visit: Payer: Self-pay

## 2021-06-11 ENCOUNTER — Emergency Department (HOSPITAL_BASED_OUTPATIENT_CLINIC_OR_DEPARTMENT_OTHER)
Admission: EM | Admit: 2021-06-11 | Discharge: 2021-06-11 | Disposition: A | Discharge status: Home / Self Care | Payer: Medicare Other | Attending: Emergency Medicine | Admitting: Emergency Medicine

## 2021-06-11 DIAGNOSIS — R0789 Other chest pain: Secondary | ICD-10-CM

## 2021-06-11 DIAGNOSIS — Z7982 Long term (current) use of aspirin: Secondary | ICD-10-CM | POA: Diagnosis not present

## 2021-06-11 DIAGNOSIS — F0390 Unspecified dementia without behavioral disturbance: Secondary | ICD-10-CM | POA: Insufficient documentation

## 2021-06-11 DIAGNOSIS — R079 Chest pain, unspecified: Secondary | ICD-10-CM | POA: Insufficient documentation

## 2021-06-11 DIAGNOSIS — R600 Localized edema: Secondary | ICD-10-CM | POA: Insufficient documentation

## 2021-06-11 DIAGNOSIS — Z7901 Long term (current) use of anticoagulants: Secondary | ICD-10-CM | POA: Diagnosis not present

## 2021-06-11 LAB — BASIC METABOLIC PANEL
Anion gap: 10 (ref 5–15)
BUN: 17 mg/dL (ref 8–23)
CO2: 25 mmol/L (ref 22–32)
Calcium: 9.1 mg/dL (ref 8.9–10.3)
Chloride: 101 mmol/L (ref 98–111)
Creatinine, Ser: 0.72 mg/dL (ref 0.44–1.00)
GFR, Estimated: >60 mL/min (ref >60)
Glucose, Bld: 133 mg/dL — ABNORMAL HIGH (ref 70–99)
Potassium: 3.3 mmol/L — ABNORMAL LOW (ref 3.5–5.1)
Sodium: 136 mmol/L (ref 135–145)

## 2021-06-11 LAB — CBC WITH DIFFERENTIAL/PLATELET
Abs Immature Granulocytes: 0.02 10*3/uL (ref 0.00–0.07)
Basophils Absolute: 0 10*3/uL (ref 0.0–0.1)
Basophils Relative: 0 %
Eosinophils Absolute: 0.1 10*3/uL (ref 0.0–0.5)
Eosinophils Relative: 1 %
HCT: 33.2 % — ABNORMAL LOW (ref 36.0–46.0)
Hemoglobin: 11.9 g/dL — ABNORMAL LOW (ref 12.0–15.0)
Immature Granulocytes: 0 %
Lymphocytes Relative: 6 %
Lymphs Abs: 0.4 10*3/uL — ABNORMAL LOW (ref 0.7–4.0)
MCH: 30.4 pg (ref 26.0–34.0)
MCHC: 35.8 g/dL (ref 30.0–36.0)
MCV: 84.9 fL (ref 80.0–100.0)
Monocytes Absolute: 0.5 10*3/uL (ref 0.1–1.0)
Monocytes Relative: 7 %
Neutro Abs: 6 10*3/uL (ref 1.7–7.7)
Neutrophils Relative %: 86 %
Platelets: 333 10*3/uL (ref 150–400)
RBC: 3.91 MIL/uL (ref 3.87–5.11)
RDW: 13.9 % (ref 11.5–15.5)
WBC: 7 10*3/uL (ref 4.0–10.5)
nRBC: 0 % (ref 0.0–0.2)

## 2021-06-11 LAB — TROPONIN I (HIGH SENSITIVITY)
Troponin I (High Sensitivity): 15 ng/L (ref <18)
Troponin I (High Sensitivity): 15 ng/L (ref <18)

## 2021-06-11 NOTE — ED Provider Notes (Signed)
? ?[facility] EMERGENCY DEPARTMENT  ?Provider Note ? ?CSN: [id number] ?Arrival date & time: 06/11/21 0416 ? ?History ?Chief Complaint  ?Patient presents with  ? Chest Pain  ? ? ?Connie Snyder is a 88 y.o. female with dementia brought to the ED by her son who provides the history. She was found to have a PE in Dec 2022, but anticoagulation was stopped after she fell and sustained a large hematoma of her hip in Feb. More recently, she was admitted to [facility] for a syncopal episode. During that admission, she had a CTA showing persistent PE, so her anticoagulation was restarted. She had neg echo and LE DVT study then. She was ultimately discharged about 5 days ago. Son reports she woke him up around 2am complaining of sharp left upper chest pains. He reports her pain was coming and going over the course of an hour or so which prompted him to bring her in for evaluation. She has otherwise been doing well.  ? ? ?Home Medications ?Prior to Admission medications   ?Medication Sig Start Date End Date Taking? Authorizing Provider  ?acetaminophen (TYLENOL) 650 MG CR tablet Take 1,300 mg by mouth in the morning and at bedtime.    [provider]  ?apixaban (ELIQUIS) 5 MG TABS tablet Take 1 tablet (5 mg total) by mouth 2 (two) times daily. 06/06/21   Leatha Gilding, MD  ?ASPIRIN LOW DOSE 81 MG EC tablet Take 162 mg by mouth daily. 04/02/21   [provider]  ?Cholecalciferol (VITAMIN D-3) 25 MCG (1000 UT) CAPS Take 1,000 Units by mouth daily.    [provider]  ?docusate sodium (COLACE) 100 MG capsule Take 100 mg by mouth 2 (two) times daily.    [provider]  ?feeding supplement (BOOST HIGH PROTEIN) LIQD Take 1 Container by mouth 2 (two) times daily between meals.    [provider]  ?ferrous sulfate 325 (65 FE) MG tablet Take 325 mg by mouth 2 (two) times daily with a meal.    [provider]  ?hydrOXYzine (ATARAX) 25 MG tablet Take 25 mg by mouth 2 (two) times  daily. 04/17/21   [provider]  ?mirtazapine (REMERON) 30 MG tablet Take 30 mg by mouth at bedtime.  11/01/17   [provider]  ?Multiple Vitamins-Minerals (MULTIVITAMIN WOMEN 50+) TABS Take 1 tablet by mouth daily with breakfast.    [provider]  ? ? ? ?Allergies    ?Aricept [donepezil] ? ? ?Review of Systems   ?Review of Systems ?Please see HPI for pertinent positives and negatives ? ?Physical Exam ?BP (!) 179/93 (BP Location: Right Arm)   Pulse 74   Temp (!) 97.5 ?F (36.4 ?C) (Oral)   Resp 18   Wt 50.3 kg   SpO2 100%   BMI 19.05 kg/m?  ? ?Physical Exam ?Vitals and nursing note reviewed.  ?Constitutional:   ?   Appearance: Normal appearance.  ?HENT:  ?   Head: Normocephalic and atraumatic.  ?   Nose: Nose normal.  ?   Mouth/Throat:  ?   Mouth: Mucous membranes are moist.  ?Eyes:  ?   Extraocular Movements: Extraocular movements intact.  ?   Conjunctiva/sclera: Conjunctivae normal.  ?Cardiovascular:  ?   Rate and Rhythm: Normal rate.  ?Pulmonary:  ?   Effort: Pulmonary effort is normal.  ?   Breath sounds: Normal breath sounds.  ?Chest:  ?   Chest wall: No tenderness.  ?Abdominal:  ?  General: Abdomen is flat.  ?   Palpations: Abdomen is soft.  ?   Tenderness: There is no abdominal tenderness.  ?Musculoskeletal:     ?   General: Normal range of motion.  ?   Cervical back: Neck supple.  ?   Right lower leg: Edema present.  ?   Left lower leg: Edema present.  ?Skin: ?   General: Skin is warm and dry.  ?Neurological:  ?   General: No focal deficit present.  ?   Mental Status: She is alert.  ?Psychiatric:     ?   Mood and Affect: Mood normal.  ? ? ?ED Results / Procedures / Treatments   ?EKG ?EKG Interpretation ? ?Date/Time:  Friday June 11 2021 04:33:48 EST ?Ventricular Rate:  71 ?PR Interval:  154 ?QRS Duration: 125 ?QT Interval:  460 ?QTC Calculation: 500 ?R Axis:   171 ?Text Interpretation: Sinus rhythm Consider left ventricular hypertrophy Minimal ST elevation, inferior  leads Borderline prolonged QT interval No significant change since last tracing Confirmed by Susy FrizzleSheldon, Laniece Hornbaker [phone]) on 06/11/2021 4:35:55 AM ? ?Procedures ?Procedures ? ?Medications Ordered in the ED ?Medications - No data to display ? ?Initial Impression and Plan ? Patient with reported sharp chest pain at home, denies pain at the time of my evaluation. Recent diagnosis of PE complicated by hematoma but has been compliant with her anticoagulation since discharged from hospital earlier this week. Will check labs, CXR and continue to monitor. Currently NSR on cardiac monitor.  ? ?ED Course  ? ?Clinical Course as of 06/11/21 0708  ?Fri Jun 11, 2021  ?0500 CBC with mild anemia, similar to baseline.  [CS]  ?0511 BMP is unremarkable.  [CS]  ?[id number] I personally viewed the images from radiology studies and agree with radiologist interpretation: CXR without acute change.  ? [CS]  ?[id number] Initial Trop is neg. Will continue to monitor and recheck a delta Trop.  [CS]  ?[id number] Patient is resting comfortably, sleeping soundly without hypoxia or tachycardia. Doubt she has developed a hemodynamically significant PE since recent admission. Second Trop is pending.  [CS]  ?0708 Care of the patient signed out to Dr. Rubin PayorPickering at the change of shift.  [CS]  ?  ?Clinical Course User Index ?[CS] Pollyann SavoySheldon, Itzell Bendavid B, MD  ? ? ? ?MDM Rules/Calculators/A&P ?Medical Decision Making ?Amount and/or Complexity of Data Reviewed ?Labs: ordered. ?Radiology: ordered. ? ? ? ?Final Clinical Impression(s) / ED Diagnoses ?Final diagnoses:  ?None  ? ? ?Rx / DC Orders ?ED Discharge Orders   ? ? None  ? ?  ? ?  ?Pollyann SavoySheldon, Rendon Howell B, MD ?06/11/21 [phone] ? ?

## 2021-06-11 NOTE — ED Notes (Signed)
Xray to bedside.

## 2021-06-11 NOTE — ED Triage Notes (Signed)
Pt's son brought pt in tonight from home after pt was c/o sharp pain in her chest  Pt states the pains woke her up tonight  Son states pt was having some shortness of breath and has anxiety that she takes medication for   Pt was recently in the hospital for a syncopal episode   ?

## 2021-06-15 DIAGNOSIS — M25519 Pain in unspecified shoulder: Secondary | ICD-10-CM | POA: Diagnosis not present

## 2021-06-15 DIAGNOSIS — M549 Dorsalgia, unspecified: Secondary | ICD-10-CM | POA: Diagnosis not present

## 2021-06-15 DIAGNOSIS — I38 Endocarditis, valve unspecified: Secondary | ICD-10-CM | POA: Diagnosis not present

## 2021-06-15 DIAGNOSIS — M170 Bilateral primary osteoarthritis of knee: Secondary | ICD-10-CM | POA: Diagnosis not present

## 2021-06-15 DIAGNOSIS — R0781 Pleurodynia: Secondary | ICD-10-CM | POA: Diagnosis not present

## 2021-06-15 DIAGNOSIS — M3500 Sicca syndrome, unspecified: Secondary | ICD-10-CM | POA: Diagnosis not present

## 2021-06-15 DIAGNOSIS — M0589 Other rheumatoid arthritis with rheumatoid factor of multiple sites: Secondary | ICD-10-CM | POA: Diagnosis not present

## 2021-06-15 DIAGNOSIS — Z79899 Other long term (current) drug therapy: Secondary | ICD-10-CM | POA: Diagnosis not present

## 2021-06-17 DIAGNOSIS — Z09 Encounter for follow-up examination after completed treatment for conditions other than malignant neoplasm: Secondary | ICD-10-CM | POA: Diagnosis not present

## 2021-06-17 DIAGNOSIS — S2249XA Multiple fractures of ribs, unspecified side, initial encounter for closed fracture: Secondary | ICD-10-CM | POA: Diagnosis not present

## 2021-06-17 DIAGNOSIS — Z0000 Encounter for general adult medical examination without abnormal findings: Secondary | ICD-10-CM | POA: Diagnosis not present

## 2021-06-17 DIAGNOSIS — M069 Rheumatoid arthritis, unspecified: Secondary | ICD-10-CM | POA: Diagnosis not present

## 2021-06-17 DIAGNOSIS — M3500 Sicca syndrome, unspecified: Secondary | ICD-10-CM | POA: Diagnosis not present

## 2021-06-17 DIAGNOSIS — I700 Atherosclerosis of aorta: Secondary | ICD-10-CM | POA: Diagnosis not present

## 2021-06-17 DIAGNOSIS — R011 Cardiac murmur, unspecified: Secondary | ICD-10-CM | POA: Diagnosis not present

## 2021-06-17 DIAGNOSIS — D508 Other iron deficiency anemias: Secondary | ICD-10-CM | POA: Diagnosis not present

## 2021-06-29 DIAGNOSIS — M069 Rheumatoid arthritis, unspecified: Secondary | ICD-10-CM | POA: Diagnosis not present

## 2021-06-29 DIAGNOSIS — I38 Endocarditis, valve unspecified: Secondary | ICD-10-CM | POA: Diagnosis not present

## 2021-06-29 DIAGNOSIS — R600 Localized edema: Secondary | ICD-10-CM | POA: Diagnosis not present

## 2021-06-29 DIAGNOSIS — Z0000 Encounter for general adult medical examination without abnormal findings: Secondary | ICD-10-CM | POA: Diagnosis not present

## 2021-06-29 DIAGNOSIS — M3500 Sicca syndrome, unspecified: Secondary | ICD-10-CM | POA: Diagnosis not present

## 2021-06-29 DIAGNOSIS — D508 Other iron deficiency anemias: Secondary | ICD-10-CM | POA: Diagnosis not present

## 2021-06-29 DIAGNOSIS — I700 Atherosclerosis of aorta: Secondary | ICD-10-CM | POA: Diagnosis not present

## 2021-06-29 DIAGNOSIS — Z86711 Personal history of pulmonary embolism: Secondary | ICD-10-CM | POA: Diagnosis not present

## 2021-08-09 ENCOUNTER — Ambulatory Visit: Payer: Medicare Other | Admitting: Orthopaedic Surgery

## 2022-01-20 DIAGNOSIS — M069 Rheumatoid arthritis, unspecified: Secondary | ICD-10-CM | POA: Diagnosis not present

## 2022-01-20 DIAGNOSIS — I700 Atherosclerosis of aorta: Secondary | ICD-10-CM | POA: Diagnosis not present

## 2022-01-20 DIAGNOSIS — R600 Localized edema: Secondary | ICD-10-CM | POA: Diagnosis not present

## 2022-01-20 DIAGNOSIS — D508 Other iron deficiency anemias: Secondary | ICD-10-CM | POA: Diagnosis not present

## 2022-02-01 DIAGNOSIS — I38 Endocarditis, valve unspecified: Secondary | ICD-10-CM | POA: Diagnosis not present

## 2022-02-01 DIAGNOSIS — Z86711 Personal history of pulmonary embolism: Secondary | ICD-10-CM | POA: Diagnosis not present

## 2022-02-01 DIAGNOSIS — M3500 Sicca syndrome, unspecified: Secondary | ICD-10-CM | POA: Diagnosis not present

## 2022-02-01 DIAGNOSIS — I700 Atherosclerosis of aorta: Secondary | ICD-10-CM | POA: Diagnosis not present

## 2022-02-01 DIAGNOSIS — Z23 Encounter for immunization: Secondary | ICD-10-CM | POA: Diagnosis not present

## 2022-02-01 DIAGNOSIS — D508 Other iron deficiency anemias: Secondary | ICD-10-CM | POA: Diagnosis not present

## 2022-02-01 DIAGNOSIS — R600 Localized edema: Secondary | ICD-10-CM | POA: Diagnosis not present

## 2022-02-01 DIAGNOSIS — M069 Rheumatoid arthritis, unspecified: Secondary | ICD-10-CM | POA: Diagnosis not present

## 2022-02-03 ENCOUNTER — Ambulatory Visit (INDEPENDENT_AMBULATORY_CARE_PROVIDER_SITE_OTHER): Payer: Medicare Other | Admitting: Podiatry

## 2022-02-03 DIAGNOSIS — Z91199 Patient's noncompliance with other medical treatment and regimen due to unspecified reason: Secondary | ICD-10-CM

## 2022-02-03 NOTE — Progress Notes (Signed)
Patient was no-show for appointment today 

## 2022-02-10 DIAGNOSIS — M170 Bilateral primary osteoarthritis of knee: Secondary | ICD-10-CM | POA: Diagnosis not present

## 2022-02-10 DIAGNOSIS — M549 Dorsalgia, unspecified: Secondary | ICD-10-CM | POA: Diagnosis not present

## 2022-02-10 DIAGNOSIS — M0589 Other rheumatoid arthritis with rheumatoid factor of multiple sites: Secondary | ICD-10-CM | POA: Diagnosis not present

## 2022-02-10 DIAGNOSIS — Z79899 Other long term (current) drug therapy: Secondary | ICD-10-CM | POA: Diagnosis not present

## 2022-02-10 DIAGNOSIS — M25562 Pain in left knee: Secondary | ICD-10-CM | POA: Diagnosis not present

## 2022-02-10 DIAGNOSIS — M25561 Pain in right knee: Secondary | ICD-10-CM | POA: Diagnosis not present

## 2022-02-10 DIAGNOSIS — M25519 Pain in unspecified shoulder: Secondary | ICD-10-CM | POA: Diagnosis not present

## 2022-02-10 DIAGNOSIS — I38 Endocarditis, valve unspecified: Secondary | ICD-10-CM | POA: Diagnosis not present

## 2022-02-10 DIAGNOSIS — M3500 Sicca syndrome, unspecified: Secondary | ICD-10-CM | POA: Diagnosis not present

## 2022-02-20 ENCOUNTER — Emergency Department (HOSPITAL_BASED_OUTPATIENT_CLINIC_OR_DEPARTMENT_OTHER): Payer: Medicare Other

## 2022-02-20 ENCOUNTER — Encounter (HOSPITAL_BASED_OUTPATIENT_CLINIC_OR_DEPARTMENT_OTHER): Payer: Self-pay | Admitting: Emergency Medicine

## 2022-02-20 ENCOUNTER — Emergency Department (HOSPITAL_BASED_OUTPATIENT_CLINIC_OR_DEPARTMENT_OTHER)
Admission: EM | Admit: 2022-02-20 | Discharge: 2022-02-20 | Disposition: A | Discharge status: Home / Self Care | Payer: Medicare Other | Attending: Emergency Medicine | Admitting: Emergency Medicine

## 2022-02-20 ENCOUNTER — Other Ambulatory Visit: Payer: Self-pay

## 2022-02-20 DIAGNOSIS — Z87891 Personal history of nicotine dependence: Secondary | ICD-10-CM | POA: Insufficient documentation

## 2022-02-20 DIAGNOSIS — Z79899 Other long term (current) drug therapy: Secondary | ICD-10-CM | POA: Insufficient documentation

## 2022-02-20 DIAGNOSIS — I2693 Single subsegmental pulmonary embolism without acute cor pulmonale: Secondary | ICD-10-CM | POA: Diagnosis not present

## 2022-02-20 DIAGNOSIS — Z7982 Long term (current) use of aspirin: Secondary | ICD-10-CM | POA: Diagnosis not present

## 2022-02-20 DIAGNOSIS — Z7901 Long term (current) use of anticoagulants: Secondary | ICD-10-CM | POA: Diagnosis not present

## 2022-02-20 DIAGNOSIS — R079 Chest pain, unspecified: Secondary | ICD-10-CM | POA: Diagnosis not present

## 2022-02-20 DIAGNOSIS — R0602 Shortness of breath: Secondary | ICD-10-CM | POA: Diagnosis not present

## 2022-02-20 DIAGNOSIS — Z96642 Presence of left artificial hip joint: Secondary | ICD-10-CM | POA: Insufficient documentation

## 2022-02-20 HISTORY — DX: Other pulmonary embolism without acute cor pulmonale: I26.99

## 2022-02-20 LAB — CBC WITH DIFFERENTIAL/PLATELET
Abs Immature Granulocytes: 0.04 10*3/uL (ref 0.00–0.07)
Basophils Absolute: 0 10*3/uL (ref 0.0–0.1)
Basophils Relative: 0 %
Eosinophils Absolute: 0.2 10*3/uL (ref 0.0–0.5)
Eosinophils Relative: 3 %
HCT: 30.8 % — ABNORMAL LOW (ref 36.0–46.0)
Hemoglobin: 10.8 g/dL — ABNORMAL LOW (ref 12.0–15.0)
Immature Granulocytes: 1 %
Lymphocytes Relative: 13 %
Lymphs Abs: 1.1 10*3/uL (ref 0.7–4.0)
MCH: 28.3 pg (ref 26.0–34.0)
MCHC: 35.1 g/dL (ref 30.0–36.0)
MCV: 80.6 fL (ref 80.0–100.0)
Monocytes Absolute: 0.6 10*3/uL (ref 0.1–1.0)
Monocytes Relative: 7 %
Neutro Abs: 6.2 10*3/uL (ref 1.7–7.7)
Neutrophils Relative %: 76 %
Platelets: 358 10*3/uL (ref 150–400)
RBC: 3.82 MIL/uL — ABNORMAL LOW (ref 3.87–5.11)
RDW: 15.8 % — ABNORMAL HIGH (ref 11.5–15.5)
WBC: 8.1 10*3/uL (ref 4.0–10.5)
nRBC: 0 % (ref 0.0–0.2)

## 2022-02-20 LAB — BRAIN NATRIURETIC PEPTIDE: B Natriuretic Peptide: 97.3 pg/mL (ref 0.0–100.0)

## 2022-02-20 LAB — BASIC METABOLIC PANEL
Anion gap: 6 (ref 5–15)
BUN: 26 mg/dL — ABNORMAL HIGH (ref 8–23)
CO2: 25 mmol/L (ref 22–32)
Calcium: 8.6 mg/dL — ABNORMAL LOW (ref 8.9–10.3)
Chloride: 103 mmol/L (ref 98–111)
Creatinine, Ser: 0.53 mg/dL (ref 0.44–1.00)
GFR, Estimated: >60 mL/min (ref >60)
Glucose, Bld: 93 mg/dL (ref 70–99)
Potassium: 4.3 mmol/L (ref 3.5–5.1)
Sodium: 134 mmol/L — ABNORMAL LOW (ref 135–145)

## 2022-02-20 LAB — D-DIMER, QUANTITATIVE: D-Dimer, Quant: >20 ug/mL-FEU — ABNORMAL HIGH (ref 0.00–0.50)

## 2022-02-20 LAB — TROPONIN I (HIGH SENSITIVITY)
Troponin I (High Sensitivity): 8 ng/L (ref <18)
Troponin I (High Sensitivity): 7 ng/L (ref <18)

## 2022-02-20 MED ORDER — APIXABAN (ELIQUIS) VTE STARTER PACK (10MG AND 5MG): ORAL_TABLET | ORAL | 0 refills | Status: DC

## 2022-02-20 MED ORDER — IOHEXOL 350 MG/ML SOLN
75.0000 mL | Freq: Once | INTRAVENOUS | Status: AC | PRN
  Administered 2022-02-20: 75 mL via INTRAVENOUS

## 2022-02-20 MED ORDER — APIXABAN 2.5 MG PO TABS
10.0000 mg | ORAL_TABLET | Freq: Once | ORAL | Status: AC
  Administered 2022-02-20: 10 mg via ORAL
  Filled 2022-02-20: qty 4

## 2022-02-20 NOTE — ED Provider Notes (Signed)
[facility] Provider Note: Lowella Dell, MD, FACEP  CSN: [id number] MRN: [id number] ARRIVAL: 02/20/22 at 0001 ROOM: MH02/MH02   CHIEF COMPLAINT  Shortness of Breath  Level 5: Confusion; son is the primary historian HISTORY OF PRESENT ILLNESS  02/20/22 12:21 AM Connie Snyder is a 88 y.o. female with a history of pulmonary embolism no longer on anticoagulation.  About an hour ago she awakened from sleep complaining of burning pain in her chest with shortness of breath.  On the way to the emergency department her pain resolved and she is now only complaining of shortness of breath and a tightness in her chest but no frank pain.  She denies nausea.  She is not diaphoretic.  She has not had a cough or fever.   Past Medical History:  Diagnosis Date   Arthritis    Pulmonary embolism (HCC)    Sjogren's disease (HCC)     Past Surgical History:  Procedure Laterality Date   TOTAL HIP ARTHROPLASTY Left 12/22/2017   Procedure: TOTAL HIP ARTHROPLASTY ANTERIOR APPROACH;  Surgeon: Tarry Kos, MD;  Location: [facility];  Service: Orthopedics;  Laterality: Left;    Family History  Problem Relation Age of Onset   Pulmonary embolism Other     Social History   Tobacco Use   Smoking status: Former   Smokeless tobacco: Never  Vaping Use   Vaping Use: Never used  Substance Use Topics   Alcohol use: Yes    Comment: weekly   Drug use: Never    Prior to Admission medications   Medication Sig Start Date End Date Taking? Authorizing Provider  acetaminophen (TYLENOL) 650 MG CR tablet Take 1,300 mg by mouth in the morning and at bedtime.    [provider]  apixaban (ELIQUIS) 5 MG TABS tablet Take 1 tablet (5 mg total) by mouth 2 (two) times daily. 06/06/21   Gherghe, Daylene Katayama, MD  ASPIRIN LOW DOSE 81 MG EC tablet Take 162 mg by mouth daily. 04/02/21   [provider]  Cholecalciferol (VITAMIN D-3) 25 MCG (1000 UT) CAPS Take 1,000 Units by mouth daily.     [provider]  docusate sodium (COLACE) 100 MG capsule Take 100 mg by mouth 2 (two) times daily.    [provider]  feeding supplement (BOOST HIGH PROTEIN) LIQD Take 1 Container by mouth 2 (two) times daily between meals.    [provider]  ferrous sulfate 325 (65 FE) MG tablet Take 325 mg by mouth 2 (two) times daily with a meal.    [provider]  hydrOXYzine (ATARAX) 25 MG tablet Take 25 mg by mouth 2 (two) times daily. 04/17/21   [provider]  mirtazapine (REMERON) 30 MG tablet Take 30 mg by mouth at bedtime.  11/01/17   [provider]  Multiple Vitamins-Minerals (MULTIVITAMIN WOMEN 50+) TABS Take 1 tablet by mouth daily with breakfast.    [provider]    Allergies Aricept [donepezil]   REVIEW OF SYSTEMS  Level 5 caveat   PHYSICAL EXAMINATION  Initial Vital Signs Blood pressure 138/86, pulse 63, temperature 98.2 F (36.8 C), temperature source Oral, resp. rate 20, SpO2 100 %.  Examination General: Well-developed, well-nourished female in no acute distress; appearance consistent with age of record HENT: normocephalic; atraumatic Eyes: Normal appearance Neck: supple Heart: regular rate and rhythm Lungs: clear to auscultation bilaterally Abdomen: soft; nondistended; nontender; bowel sounds present Extremities: No deformity; full range of motion; chronic appearing edema  of lower legs and ankles Neurologic: Awake, alert and oriented x 2; motor function intact in all extremities and symmetric; no facial droop Skin: Warm and dry Psychiatric: Normal mood and affect   RESULTS  Summary of this visit's results, reviewed and interpreted by myself:   EKG Interpretation  Date/Time:  Sunday February 20 2022 00:15:16 EST Ventricular Rate:  64 PR Interval:  160 QRS Duration: 122 QT Interval:  460 QTC Calculation: 475 R Axis:   -51 Text Interpretation: Sinus rhythm Nonspecific IVCD with LAD Left ventricular  hypertrophy ST elevation, consider inferior injury No previous ECGs available Confirmed by Paula Libra ([id number]) on 02/20/2022 12:20:56 AM       Laboratory Studies: Results for orders placed or performed during the hospital encounter of 02/20/22 (from the past 24 hour(s))  D-dimer, quantitative     Status: Abnormal   Collection Time: 02/20/22  1:39 AM  Result Value Ref Range   D-Dimer, Quant >20.00 (H) 0.00 - 0.50 ug/mL-FEU  CBC with Differential     Status: Abnormal   Collection Time: 02/20/22  1:39 AM  Result Value Ref Range   WBC 8.1 4.0 - 10.5 K/uL   RBC 3.82 (L) 3.87 - 5.11 MIL/uL   Hemoglobin 10.8 (L) 12.0 - 15.0 g/dL   HCT 95.1 (L) 88.4 - 16.6 %   MCV 80.6 80.0 - 100.0 fL   MCH 28.3 26.0 - 34.0 pg   MCHC 35.1 30.0 - 36.0 g/dL   RDW 06.3 (H) 01.6 - 01.0 %   Platelets 358 150 - 400 K/uL   nRBC 0.0 0.0 - 0.2 %   Neutrophils Relative % 76 %   Neutro Abs 6.2 1.7 - 7.7 K/uL   Lymphocytes Relative 13 %   Lymphs Abs 1.1 0.7 - 4.0 K/uL   Monocytes Relative 7 %   Monocytes Absolute 0.6 0.1 - 1.0 K/uL   Eosinophils Relative 3 %   Eosinophils Absolute 0.2 0.0 - 0.5 K/uL   Basophils Relative 0 %   Basophils Absolute 0.0 0.0 - 0.1 K/uL   Immature Granulocytes 1 %   Abs Immature Granulocytes 0.04 0.00 - 0.07 K/uL  Basic metabolic panel     Status: Abnormal   Collection Time: 02/20/22  1:39 AM  Result Value Ref Range   Sodium 134 (L) 135 - 145 mmol/L   Potassium 4.3 3.5 - 5.1 mmol/L   Chloride 103 98 - 111 mmol/L   CO2 25 22 - 32 mmol/L   Glucose, Bld 93 70 - 99 mg/dL   BUN 26 (H) 8 - 23 mg/dL   Creatinine, Ser 9.32 0.44 - 1.00 mg/dL   Calcium 8.6 (L) 8.9 - 10.3 mg/dL   GFR, Estimated >35 >57 mL/min   Anion gap 6 5 - 15  Troponin I (High Sensitivity)     Status: None   Collection Time: 02/20/22  1:39 AM  Result Value Ref Range   Troponin I (High Sensitivity) 8 <18 ng/L  Brain natriuretic peptide     Status: None   Collection Time: 02/20/22  1:39 AM  Result Value Ref Range    B Natriuretic Peptide 97.3 0.0 - 100.0 pg/mL  Troponin I (High Sensitivity)     Status: None   Collection Time: 02/20/22  4:38 AM  Result Value Ref Range   Troponin I (High Sensitivity) 7 <18 ng/L   Imaging Studies: CT Angio Chest PE W and/or Wo Contrast  Result Date: 02/20/2022 CLINICAL DATA:  Pulmonary embolism (PE) suspected, high prob.  Shortness of breath EXAM: CT ANGIOGRAPHY CHEST WITH CONTRAST TECHNIQUE: Multidetector CT imaging of the chest was performed using the standard protocol during bolus administration of intravenous contrast. Multiplanar CT image reconstructions and MIPs were obtained to evaluate the vascular anatomy. RADIATION DOSE REDUCTION: This exam was performed according to the departmental dose-optimization program which includes automated exposure control, adjustment of the mA and/or kV according to patient size and/or use of iterative reconstruction technique. CONTRAST:  13mL OMNIPAQUE IOHEXOL 350 MG/ML SOLN COMPARISON:  06/03/2021 FINDINGS: Cardiovascular: Small left lower lobe pulmonary embolus. No pulmonary emboli on the right. Heart is mildly enlarged. Tortuous aorta with scattered calcifications. No evidence of aortic aneurysm. Mediastinum/Nodes: No mediastinal, hilar, or axillary adenopathy. Trachea and esophagus are unremarkable. Thyroid unremarkable. Lungs/Pleura: Linear areas of scarring in the lower lobes. No confluent opacities or effusions. Upper Abdomen: No acute findings Musculoskeletal: Chest wall soft tissues are unremarkable. No acute bony abnormality. Review of the MIP images confirms the above findings. IMPRESSION: Small subsegmental left lower lobe pulmonary embolus. Mild cardiomegaly, coronary artery disease. Tortuous aorta. Aortic Atherosclerosis (ICD10-I70.0). Electronically Signed   By: Charlett Nose M.D.   On: 02/20/2022 03:15   DG Chest Port 1 View  Result Date: 02/20/2022 CLINICAL DATA:  Shortness of breath and chest pain EXAM: PORTABLE CHEST 1 VIEW  COMPARISON:  None Available. FINDINGS: The heart size and mediastinal contours are within normal limits. Both lungs are clear. Chronic fracture of the left humeral neck. Multiple old right-sided rib fractures. IMPRESSION: No active disease. Electronically Signed   By: Deatra Robinson M.D.   On: 02/20/2022 00:49    ED COURSE and MDM  Nursing notes, initial and subsequent vitals signs, including pulse oximetry, reviewed and interpreted by myself.  Vitals:   02/20/22 0140 02/20/22 0242 02/20/22 0300 02/20/22 0430  BP: (!) 144/66 138/68 (!) 150/66 (!) 124/96  Pulse: 63 64 71 69  Resp: 20 17 18 18   Temp:    98 F (36.7 C)  TempSrc:    Oral  SpO2: 98% 98% 98% 98%   Medications  iohexol (OMNIPAQUE) 350 MG/ML injection 75 mL (75 mLs Intravenous Contrast Given 02/20/22 0305)  apixaban (ELIQUIS) tablet 10 mg (10 mg Oral Given 02/20/22 0340)   3:29 AM CT positive for a subsegmental pulmonary embolism.  We will restart her on Eliquis.  5:09 AM EKG nonischemic and troponin is normal.  Pain and shortness of breath are most likely due to acute subsegmental pulmonary embolism.  As noted above we have restarted her on Eliquis.  She has an appointment with her cardiologist tomorrow.   PROCEDURES  Procedures   ED DIAGNOSES     ICD-10-CM   1. Single subsegmental pulmonary embolism without acute cor pulmonale (HCC)  I26.93          Kelci Petrella, 02/22/22, MD 02/20/22 02/22/22

## 2022-02-20 NOTE — ED Triage Notes (Signed)
Pt to exam 2 via wheelchair c/o SOB x 2 hours denies CP at this time. Dr. Read Drivers to bedside.  PT a/o x person and place. Pt resting on stretcher with rails up x 2 monitor applied, EKG completed, IV established, Labs drawn. PT has Hx of PE

## 2022-02-28 ENCOUNTER — Encounter (HOSPITAL_BASED_OUTPATIENT_CLINIC_OR_DEPARTMENT_OTHER): Payer: Self-pay | Admitting: Cardiology

## 2022-02-28 ENCOUNTER — Ambulatory Visit (INDEPENDENT_AMBULATORY_CARE_PROVIDER_SITE_OTHER): Payer: Medicare Other | Admitting: Cardiology

## 2022-02-28 VITALS — BP 130/74 | HR 76 | Ht 64.0 in | Wt 110.0 lb

## 2022-02-28 DIAGNOSIS — Z86711 Personal history of pulmonary embolism: Secondary | ICD-10-CM

## 2022-02-28 DIAGNOSIS — I340 Nonrheumatic mitral (valve) insufficiency: Secondary | ICD-10-CM | POA: Diagnosis not present

## 2022-02-28 DIAGNOSIS — Z9181 History of falling: Secondary | ICD-10-CM

## 2022-02-28 DIAGNOSIS — F0150 Vascular dementia without behavioral disturbance: Secondary | ICD-10-CM

## 2022-02-28 DIAGNOSIS — R600 Localized edema: Secondary | ICD-10-CM

## 2022-02-28 DIAGNOSIS — I361 Nonrheumatic tricuspid (valve) insufficiency: Secondary | ICD-10-CM

## 2022-02-28 DIAGNOSIS — I2609 Other pulmonary embolism with acute cor pulmonale: Secondary | ICD-10-CM

## 2022-02-28 NOTE — Progress Notes (Signed)
Cardiology Office Note:    Date:  02/28/2022   ID:  Connie Snyder, DOB 10/18/1927, MRN [id number]  PCP:  Georgianne Fick, MD  Cardiologist:  Jodelle Red, MD  Referring MD: Linus Galas, NP   CC:  New patient evaluation for LE edema  History of Present Illness:    Connie Snyder is a 88 y.o. female with a hx of PE, Sjogren's disease, vascular dementia, RA, who is seen as a new consult at the request of Linus Galas, NP for the evaluation and management of bilateral LE edema..  Referral notes from Linus Galas, NP personally reviewed. At their visit 02/01/2022 she continued to have LE edema with improvement on furosemide 20 mg. She was referred to cardiology for further evaluation for routine assessment due to history of atypical chest pain with SOB, heart murmur, elevated BNP, and LE edema. It was noted that she was only on 81 mg ASA after taking Plavix for 3 months.  She most recently seen in the ED 02/20/2022 after waking from sleep with burning chest pain and shortness of breath. Her pain resolved en route to the ER. Her EKG showed sinus rhythm at 64 bpm, nonspecific IVCD with LAD, left ventricular hypertrophy, ST elevation, consider inferior injury. Troponin was normal. CT imaging was positive for acute subsegmental pulmonary embolism. She was restarted on Eliquis.  Today, she is accompanied by her son, who is the primary historian. We reviewed her medication regimen in detail with reconciliation of her med list.   She confirms having LE edema. Her son reports that it has been intermittent up to below the knees but has been improving since she was started on anticoagulation. Usually her swelling has resolved within a couple of days when taking her prn furosemide.  Lately she complains of increased skin sensitivity below her ankles with palpation, which is atypical for her.  Additionally she has had issues with varicose veins. She has tried wearing compression socks, but  she will still notice swelling along the top of the socks.   They report that her initial PE occurred in December 2022. In the past she has fallen occasionally. Yesterday she fell somewhat out of her chair. Her son is concerned about remaining on anticoagulation long-term.  She does plan to return to the pool for increasing her activity.  Her son notes that one of her anxiety medications may have a side effect of shortness of breath.  She denies any palpitations, chest pain, lightheadedness, headaches, syncope, orthopnea, or PND.  Past Medical History:  Diagnosis Date   Arthritis    Pulmonary embolism (HCC)    Sjogren's disease (HCC)     Past Surgical History:  Procedure Laterality Date   TOTAL HIP ARTHROPLASTY Left 12/22/2017   Procedure: TOTAL HIP ARTHROPLASTY ANTERIOR APPROACH;  Surgeon: Tarry Kos, MD;  Location: [facility] OR;  Service: Orthopedics;  Laterality: Left;    Current Medications: Current Outpatient Medications on File Prior to Visit  Medication Sig   APIXABAN (ELIQUIS) VTE STARTER PACK (10MG  AND 5MG ) Take as directed on package: start with two-5mg  tablets twice daily for 7 days. On day 8, switch to one-5mg  tablet twice daily.   feeding supplement (BOOST HIGH PROTEIN) LIQD Take 1 Container by mouth 2 (two) times daily between meals.   ferrous sulfate 325 (65 FE) MG tablet Take 325 mg by mouth 2 (two) times daily with a meal.   furosemide (LASIX) 20 MG tablet Take 20 mg by mouth daily as needed for fluid.  Multiple Vitamins-Minerals (MULTIVITAMIN WOMEN 50+) TABS Take 1 tablet by mouth daily with breakfast.   potassium chloride (KLOR-CON) 10 MEQ tablet Take 10 mEq by mouth daily as needed (when taking furosemide).   No current facility-administered medications on file prior to visit.     Allergies:   Aricept [donepezil]   Social History   Tobacco Use   Smoking status: Former   Smokeless tobacco: Never  Building services engineer Use: Never used  Substance Use Topics    Alcohol use: Yes    Comment: weekly   Drug use: Never    Family History: family history includes Pulmonary embolism in an other family member.  ROS:   Please see the history of present illness.  Additional pertinent ROS: Constitutional: Negative for chills, fever, night sweats, unintentional weight loss  HENT: Negative for ear pain and hearing loss.   Eyes: Negative for loss of vision and eye pain.  Respiratory: Negative for cough, sputum, wheezing.   Cardiovascular: See HPI. Gastrointestinal: Negative for abdominal pain, melena, and hematochezia.  Genitourinary: Negative for dysuria and hematuria.  Musculoskeletal: Negative for falls and myalgias.  Skin: Negative for itching and rash.  Neurological: Negative for focal weakness, focal sensory changes and loss of consciousness.  Endo/Heme/Allergies: Does not bruise/bleed easily.     EKGs/Labs/Other Studies Reviewed:    The following studies were reviewed today:  CTA Chest  02/20/2022: IMPRESSION: Small subsegmental left lower lobe pulmonary embolus.   Mild cardiomegaly, coronary artery disease.   Tortuous aorta.   Aortic Atherosclerosis (ICD10-I70.0).  Bilateral LE Venous Doppler  06/03/2021: Summary:  BILATERAL:  - No evidence of deep vein thrombosis seen in the lower extremities,  bilaterally.  -No evidence of popliteal cyst, bilaterally.   Echo  06/03/2021:  1. Left ventricular ejection fraction, by estimation, is 60 to 65%. The  left ventricle has normal function. The left ventricle has no regional  wall motion abnormalities. There is moderate asymmetric left ventricular hypertrophy of the basal-septal segment. Left ventricular diastolic parameters are consistent with Grade I diastolic dysfunction (impaired relaxation).   2. Right ventricular systolic function is normal. The right ventricular  size is normal. There is moderately elevated pulmonary artery systolic pressure. The estimated right ventricular systolic  pressure is 55.9 mmHg.   3. The mitral valve is abnormal. Mild to moderate mitral valve  regurgitation.   4. Tricuspid valve regurgitation is moderate.   5. The aortic valve is tricuspid. Aortic valve regurgitation is trivial.  Aortic valve sclerosis/calcification is present, without any evidence of  aortic stenosis. Aortic valve mean gradient measures 13.0 mmHg.   6. The inferior vena cava is normal in size with <50% respiratory  variability, suggesting right atrial pressure of 8 mmHg.   Comparison(s): No prior Echocardiogram.    EKG:  EKG is personally reviewed.   02/28/2022:  not ordered today 02/20/2022: NSR, IVCD  Recent Labs: 04/26/2021: ALT 17 02/20/2022: B Natriuretic Peptide 97.3; BUN 26; Creatinine, Ser 0.53; Hemoglobin 10.8; Platelets 358; Potassium 4.3; Sodium 134   Recent Lipid Panel No results found for: "CHOL", "TRIG", "HDL", "CHOLHDL", "VLDL", "LDLCALC", "LDLDIRECT"  Physical Exam:    VS:  BP 130/74   Pulse 76   Ht 5\' 4"  (1.626 m)   Wt 110 lb (49.9 kg)   SpO2 91%   BMI 18.88 kg/m     Wt Readings from Last 3 Encounters:  02/28/22 110 lb (49.9 kg)  06/11/21 110 lb 14.3 oz (50.3 kg)  06/06/21 112 lb 7 oz (  51 kg)    GEN: Well nourished, well developed in no acute distress; In wheelchair HEENT: Normal, moist mucous membranes NECK: No JVD CARDIAC: regular rhythm, normal S1 and S2, no rubs or gallops. Two different 2/6 systolic murmurs, both left and right sternal borders bilaterally. VASCULAR: Radial and DP pulses 2+ bilaterally. No carotid bruits RESPIRATORY:  Clear to auscultation without rales, wheezing or rhonchi  ABDOMEN: Soft, non-tender, non-distended MUSCULOSKELETAL:  Ambulates independently SKIN: Warm and dry, wrinkling of upper calves without edem, 2+ LE edema starting at mid calves and distally bilaterally; tenderness to palpation below her ankles NEUROLOGIC:  Alert and oriented x 3. No focal neuro deficits noted. PSYCHIATRIC:  Normal affect     ASSESSMENT:    1. Other pulmonary embolism with acute cor pulmonale, unspecified chronicity (HCC)   2. Lower extremity edema   3. Vascular dementia without behavioral disturbance (HCC)   4. History of pulmonary embolism   5. At risk for injury related to fall   6. Nonrheumatic mitral valve regurgitation   7. Nonrheumatic tricuspid valve regurgitation    PLAN:    Bilateral LE edema Recent pulmonary embolism, with suspected acute cor pulmonale History of prior pulmonary embolism 03/2021 Murmurs consistent with MR and TR -edema improving with anticoagulation. He has not used furosemide since the ER visit. Discussed reasonable to continue this for 1-2 day, then can use PRN for swelling after this. Should only take the potassium when she is receiving the furosemide -no appreciable JVD today sitting upright in wheelchair.  -prior echo with elevated PA pressures, moderate TR, moderate MR -given recurrent pulmonary embolism, will recheck echo to evaluate RV for failure -patient's son is hoping to stop anticoagulation ASAP due to her high fall risk (had minor fall yesterday). However, this is her second PE, no clear provoking factor. I am concerned that she is high risk for future VTE. Defer to decision making with primary care team and patient's son. Patient is pleasant today but not oriented to place or time (thinks she is in school, thinks her son is her brother), so ultimately decision will need to be made by family -if MR/TR worse or RV worse than prior, may need chronic furosemide to manage fluid -doesn't tolerate compression stockings, trying to keep elevated  Plan for follow up: 4-5 weeks to discuss Echo results, or sooner as needed.  Jodelle Red, MD, PhD, [facility]  [facility]    Medication Adjustments/Labs and Tests Ordered: Current medicines are reviewed at length with the patient today.  Concerns regarding medicines are outlined above.   Orders Placed This  Encounter  Procedures   ECHOCARDIOGRAM COMPLETE   No orders of the defined types were placed in this encounter.  Patient Instructions  Medication Instructions:  Your physician has recommended you make the following change in your medication:   Change: Lasix as needed  *If you need a refill on your cardiac medications before your next appointment, please call your pharmacy*  Testing/Procedures: Your physician has requested that you have an echocardiogram. Echocardiography is a painless test that uses sound waves to create images of your heart. It provides your doctor with information about the size and shape of your heart and how well your heart's chambers and valves are working. This procedure takes approximately one hour. There are no restrictions for this procedure. Please do NOT wear cologne, perfume, aftershave, or lotions (deodorant is allowed). Please arrive 15 minutes prior to your appointment time.    Follow-Up: At [facility], you and your health needs are our priority.  As part of our continuing mission to provide you with exceptional heart care, we have created designated Provider Care Teams.  These Care Teams include your primary Cardiologist (physician) and Advanced Practice Providers (APPs -  Physician Assistants and Nurse Practitioners) who all work together to provide you with the care you need, when you need it.  We recommend signing up for the patient portal called "MyChart".  Sign up information is provided on this After Visit Summary.  MyChart is used to connect with patients for Virtual Visits (Telemedicine).  Patients are able to view lab/test results, encounter notes, upcoming appointments, etc.  Non-urgent messages can be sent to your provider as well.   To learn more about what you can do with MyChart, go to [email or web address].    Your next appointment:   4-5 week(s)  The format for your next appointment:   In Person  Provider:   Jodelle Red, MD or Gillian Shields, NP         [facility],acting as a scribe for Jodelle Red, MD.,have documented all relevant documentation on the behalf of Jodelle Red, MD,as directed by  Jodelle Red, MD while in the presence of Jodelle Red, MD.  I, Jodelle Red, MD, have reviewed all documentation for this visit. The documentation on 02/28/22 for the exam, diagnosis, procedures, and orders are all accurate and complete.   Signed, Jodelle Red, MD PhD 02/28/2022 6:14 PM    [facility]

## 2022-02-28 NOTE — Patient Instructions (Signed)
Medication Instructions:  Your physician has recommended you make the following change in your medication:   Change: Lasix as needed  *If you need a refill on your cardiac medications before your next appointment, please call your pharmacy*  Testing/Procedures: Your physician has requested that you have an echocardiogram. Echocardiography is a painless test that uses sound waves to create images of your heart. It provides your doctor with information about the size and shape of your heart and how well your heart's chambers and valves are working. This procedure takes approximately one hour. There are no restrictions for this procedure. Please do NOT wear cologne, perfume, aftershave, or lotions (deodorant is allowed). Please arrive 15 minutes prior to your appointment time.    Follow-Up: At [facility], you and your health needs are our priority.  As part of our continuing mission to provide you with exceptional heart care, we have created designated Provider Care Teams.  These Care Teams include your primary Cardiologist (physician) and Advanced Practice Providers (APPs -  Physician Assistants and Nurse Practitioners) who all work together to provide you with the care you need, when you need it.  We recommend signing up for the patient portal called "MyChart".  Sign up information is provided on this After Visit Summary.  MyChart is used to connect with patients for Virtual Visits (Telemedicine).  Patients are able to view lab/test results, encounter notes, upcoming appointments, etc.  Non-urgent messages can be sent to your provider as well.   To learn more about what you can do with MyChart, go to [email or web address].    Your next appointment:   4-5 week(s)  The format for your next appointment:   In Person  Provider:   Jodelle Red, MD or Gillian Shields, NP

## 2022-03-02 ENCOUNTER — Ambulatory Visit: Payer: Medicare Other | Admitting: Podiatry

## 2022-03-02 DIAGNOSIS — B351 Tinea unguium: Secondary | ICD-10-CM | POA: Diagnosis not present

## 2022-03-02 DIAGNOSIS — M79675 Pain in left toe(s): Secondary | ICD-10-CM | POA: Diagnosis not present

## 2022-03-02 DIAGNOSIS — M79674 Pain in right toe(s): Secondary | ICD-10-CM

## 2022-03-02 NOTE — Progress Notes (Signed)
  Subjective:  Patient ID: Connie Snyder, female    DOB: Oct 29, 1927,  MRN: [id number]  Chief Complaint  Patient presents with   Nail Problem    Thick painful toenails, 3 month follow up    88 y.o. female returns with the above complaint. History confirmed with patient.  Here today with her son.  It has been quite sometime since they had had her toenails cut.  Objective:  Physical Exam: warm, good capillary refill, no trophic changes or ulcerative lesions, normal DP and PT pulses and normal sensory exam.  Severe onychomycosis x10 to the toenails  Assessment:   1. Pain due to onychomycosis of toenails of both feet       Plan:  Patient was evaluated and treated and all questions answered.  Discussed the etiology and treatment options for the condition in detail with the patient. Educated patient on the topical and oral treatment options for mycotic nails. Recommended debridement of the nails today. Sharp and mechanical debridement performed of all painful and mycotic nails today. Nails debrided in length and thickness using a nail nipper and a mechanical burr to level of comfort. Discussed treatment options including appropriate shoe gear. Follow up as needed for painful nails.    Return in about 3 months (around 06/02/2022) for at risk  foot care.  Marland Kitchen

## 2022-03-23 ENCOUNTER — Other Ambulatory Visit (HOSPITAL_BASED_OUTPATIENT_CLINIC_OR_DEPARTMENT_OTHER): Payer: Medicare Other

## 2022-04-12 ENCOUNTER — Encounter (HOSPITAL_BASED_OUTPATIENT_CLINIC_OR_DEPARTMENT_OTHER): Payer: Self-pay | Admitting: Cardiology

## 2022-04-12 ENCOUNTER — Ambulatory Visit (HOSPITAL_BASED_OUTPATIENT_CLINIC_OR_DEPARTMENT_OTHER): Payer: Medicare Other | Admitting: Cardiology

## 2022-04-12 VITALS — BP 116/75 | HR 92 | Ht 64.0 in | Wt 111.5 lb

## 2022-04-12 DIAGNOSIS — I340 Nonrheumatic mitral (valve) insufficiency: Secondary | ICD-10-CM

## 2022-04-12 DIAGNOSIS — R600 Localized edema: Secondary | ICD-10-CM | POA: Diagnosis not present

## 2022-04-12 DIAGNOSIS — I361 Nonrheumatic tricuspid (valve) insufficiency: Secondary | ICD-10-CM

## 2022-04-12 DIAGNOSIS — Z86711 Personal history of pulmonary embolism: Secondary | ICD-10-CM | POA: Diagnosis not present

## 2022-04-12 NOTE — Patient Instructions (Addendum)
Medication Instructions:  Your physician recommends that you continue on your current medications as directed. Please refer to the Current Medication list given to you today.   Labwork: NONE  Testing/Procedures:  NONE  Follow-Up: AS NEEDED   Any Other Special Instructions Will Be Listed Below (If Applicable).  If [facility] hydrochlorothiazide, do not take it, as it is a fluid pill and you are already taking the furosemide. I will reach out to Dr. Ashby Dawes about the apixaban (eliquis).

## 2022-04-12 NOTE — Progress Notes (Signed)
Cardiology Office Note:    Date:  04/15/2022   ID:  Connie Snyder, DOB [birth year], MRN [id number]  PCP:  Georgianne Fick, MD  Cardiologist:  Jodelle Red, MD  Referring MD: Georgianne Fick, MD   CC:  follow up  History of Present Illness:    Connie Snyder is a 89 y.o. female with a hx of PE, Sjogren's disease, vascular dementia, RA, who is seen for follow up of BLE edema management.  She was previously seen in the ED 02/20/2022 after waking from sleep with burning chest pain and shortness of breath. Her pain resolved en route to the ER. Her EKG showed sinus rhythm at 64 bpm, nonspecific IVCD with LAD, left ventricular hypertrophy, ST elevation, consider inferior injury. Troponin was normal. CT imaging was positive for acute subsegmental pulmonary embolism. She was restarted on Eliquis.  Today, the patient states that she has been doing fine recently, she presents with a family member to help provide history. She believes that her BLE edema is the same as it has always been, and they she doesn't feel differently. Her son states that her left foot is more swollen than her right and that they have both improved slightly. He also recounts that she had been anxious and short of breath before coming.  She had taken a trial prescription of Eliquis which she recently ran out of. It is unclear if she has completed therapy for her PE or if the plan was to continue anticoagulation. They will reach out to her PCP to confirm. She had a large hematoma on her hip and bruises on her arm due to blood thinners in the past.  She denies any palpitations, chest pain, shortness of breath. No lightheadedness, headaches, syncope, orthopnea, or PND.  Past Medical History:  Diagnosis Date   Arthritis    Pulmonary embolism (HCC)    Sjogren's disease (HCC)     Past Surgical History:  Procedure Laterality Date   TOTAL HIP ARTHROPLASTY Left 12/22/2017   Procedure: TOTAL HIP ARTHROPLASTY  ANTERIOR APPROACH;  Surgeon: Tarry Kos, MD;  Location: [facility] OR;  Service: Orthopedics;  Laterality: Left;    Current Medications: Current Outpatient Medications on File Prior to Visit  Medication Sig   feeding supplement (BOOST HIGH PROTEIN) LIQD Take 1 Container by mouth daily.   ferrous sulfate 325 (65 FE) MG tablet Take 325 mg by mouth daily with breakfast.   furosemide (LASIX) 20 MG tablet Take 20 mg by mouth daily as needed for fluid.   hydrOXYzine (ATARAX) 25 MG tablet Take 25 mg by mouth at bedtime.   Multiple Vitamins-Minerals (MULTIVITAMIN WOMEN 50+) TABS Take 1 tablet by mouth daily with breakfast.   potassium chloride (KLOR-CON) 10 MEQ tablet Take 10 mEq by mouth daily as needed (when taking furosemide).   No current facility-administered medications on file prior to visit.     Allergies:   Aricept [donepezil]   Social History   Tobacco Use   Smoking status: Former   Smokeless tobacco: Never  Building services engineer Use: Never used  Substance Use Topics   Alcohol use: Yes    Comment: weekly   Drug use: Never    Family History: family history includes Pulmonary embolism in an other family member.  ROS:   Please see the history of present illness.  (+)Anxiety (+)SOB (+)BLE edema Additional pertinent ROS otherwise unremarkable   EKGs/Labs/Other Studies Reviewed:    The following studies were reviewed today:  CTA Chest  02/20/2022: IMPRESSION: Small subsegmental left lower lobe pulmonary embolus.   Mild cardiomegaly, coronary artery disease.   Tortuous aorta.   Aortic Atherosclerosis (ICD10-I70.0).  Bilateral LE Venous Doppler  06/03/2021: Summary:  BILATERAL:  - No evidence of deep vein thrombosis seen in the lower extremities,  bilaterally.  -No evidence of popliteal cyst, bilaterally.   Echo  06/03/2021:  1. Left ventricular ejection fraction, by estimation, is 60 to 65%. The  left ventricle has normal function. The left ventricle has no regional   wall motion abnormalities. There is moderate asymmetric left ventricular hypertrophy of the basal-septal segment. Left ventricular diastolic parameters are consistent with Grade I diastolic dysfunction (impaired relaxation).   2. Right ventricular systolic function is normal. The right ventricular  size is normal. There is moderately elevated pulmonary artery systolic pressure. The estimated right ventricular systolic pressure is 03.4 mmHg.   3. The mitral valve is abnormal. Mild to moderate mitral valve  regurgitation.   4. Tricuspid valve regurgitation is moderate.   5. The aortic valve is tricuspid. Aortic valve regurgitation is trivial.  Aortic valve sclerosis/calcification is present, without any evidence of  aortic stenosis. Aortic valve mean gradient measures 13.0 mmHg.   6. The inferior vena cava is normal in size with <50% respiratory  variability, suggesting right atrial pressure of 8 mmHg.   Comparison(s): No prior Echocardiogram.    EKG:  EKG is personally reviewed.   04/12/2022: not ordered today 02/28/2022:  not ordered today 02/20/2022: NSR, IVCD  Recent Labs: 04/26/2021: ALT 17 02/20/2022: B Natriuretic Peptide 97.3; BUN 26; Creatinine, Ser 0.53; Hemoglobin 10.8; Platelets 358; Potassium 4.3; Sodium 134   Recent Lipid Panel No results found for: "CHOL", "TRIG", "HDL", "CHOLHDL", "VLDL", "LDLCALC", "LDLDIRECT"  Physical Exam:    VS:  BP 116/75 (BP Location: Right Arm, Patient Position: Sitting, Cuff Size: Normal)   Pulse 92   Ht 5\' 4"  (1.626 m)   Wt 111 lb 8 oz (50.6 kg)   SpO2 99%   BMI 19.14 kg/m     Wt Readings from Last 3 Encounters:  04/12/22 111 lb 8 oz (50.6 kg)  02/28/22 110 lb (49.9 kg)  06/11/21 110 lb 14.3 oz (50.3 kg)    GEN: Well nourished, well developed in no acute distress; In wheelchair HEENT: Normal, moist mucous membranes NECK: No JVD CARDIAC: regular rhythm, normal S1 and S2, no rubs or gallops.  Two different 2/6 systolic murmurs, both  left and right sternal borders bilaterally. VASCULAR: Radial and DP pulses 2+ bilaterally. No carotid bruits RESPIRATORY:  Clear to auscultation without rales, wheezing or rhonchi  ABDOMEN: Soft, non-tender, non-distended MUSCULOSKELETAL:  Ambulates independently SKIN: Warm and dry, improved LE edema, now trace-1+ to lower calves NEUROLOGIC:  Alert and oriented x 3. No focal neuro deficits noted. PSYCHIATRIC:  Normal affect    ASSESSMENT:    1. Lower extremity edema   2. History of pulmonary embolism   3. Nonrheumatic mitral valve regurgitation   4. Nonrheumatic tricuspid valve regurgitation     PLAN:    Bilateral LE edema Recent pulmonary embolism, with suspected acute cor pulmonale History of prior pulmonary embolism 03/2021 Murmurs consistent with MR and TR -edema improving with anticoagulation -use PRN furosemide for swelling after this. Should only take the potassium when she is receiving the furosemide -no appreciable JVD today sitting upright in wheelchair.  -prior echo with elevated PA pressures, moderate TR, moderate MR -pending recheck echo, scheduled for next week -patient's son is hoping to stop anticoagulation  ASAP due to her high fall risk.However, this is her second PE, no clear provoking factor. I am concerned that she is high risk for future VTE. Defer to decision making with primary care team and patient's son. I will reach out to her PCP for clarity on this. -if MR/TR worse or RV worse than prior, may need chronic furosemide to manage fluid -doesn't tolerate compression stockings, trying to keep elevated  Plan for follow up: PRN depending on results of the upcoming echo  Jodelle Red, MD, PhD, [facility]  [facility]    Medication Adjustments/Labs and Tests Ordered: Current medicines are reviewed at length with the patient today.  Concerns regarding medicines are outlined above.   No orders of the defined types were placed in this  encounter.  No orders of the defined types were placed in this encounter.  Patient Instructions  Medication Instructions:  Your physician recommends that you continue on your current medications as directed. Please refer to the Current Medication list given to you today.   Labwork: NONE  Testing/Procedures:  NONE  Follow-Up: AS NEEDED   Any Other Special Instructions Will Be Listed Below (If Applicable).  [facility], do not take it, as it is a fluid pill and you are already taking the furosemide. I will reach out to Dr. Nicholos Johns about the apixaban (eliquis).   I,Coren O'Brien,acting as a Neurosurgeon for [employer], MD.,have documented all relevant documentation on the behalf of Jodelle Red, MD,as directed by  Jodelle Red, MD while in the presence of Jodelle Red, MD.  I, Jodelle Red, MD, have reviewed all documentation for this visit. The documentation on 04/15/22 for the exam, diagnosis, procedures, and orders are all accurate and complete.   Signed, Jodelle Red, MD PhD 04/15/2022 12:09 PM    [facility]

## 2022-04-15 ENCOUNTER — Encounter (HOSPITAL_BASED_OUTPATIENT_CLINIC_OR_DEPARTMENT_OTHER): Payer: Self-pay | Admitting: Cardiology

## 2022-04-19 ENCOUNTER — Ambulatory Visit (INDEPENDENT_AMBULATORY_CARE_PROVIDER_SITE_OTHER): Payer: Medicare Other

## 2022-04-19 DIAGNOSIS — I2609 Other pulmonary embolism with acute cor pulmonale: Secondary | ICD-10-CM

## 2022-04-19 DIAGNOSIS — R600 Localized edema: Secondary | ICD-10-CM

## 2022-04-19 LAB — ECHOCARDIOGRAM COMPLETE
AR max vel: 3.33 cm2
AV Area VTI: 3.27 cm2
AV Area mean vel: 3.05 cm2
AV Mean grad: 6 mmHg
AV Peak grad: 11.2 mmHg
Ao pk vel: 1.67 m/s
Area-P 1/2: 3.37 cm2
MV VTI: 3.54 cm2
S' Lateral: 1.4 cm

## 2022-04-21 ENCOUNTER — Telehealth (HOSPITAL_BASED_OUTPATIENT_CLINIC_OR_DEPARTMENT_OTHER): Payer: Self-pay

## 2022-04-21 NOTE — Telephone Encounter (Addendum)
Results called to patient and son, verbalizes understanding.    ----- Message from Buford Dresser, MD sent at 04/21/2022  9:17 AM EST ----- Overall echo is reassuring. Normal squeeze of the heart. There is one valve that is somewhat leaky, but nothing that needs fixed at this time. Pressure on the right side of the heart is normal, suggesting that the leg swelling is not due to the heart.

## 2022-04-25 ENCOUNTER — Encounter (HOSPITAL_BASED_OUTPATIENT_CLINIC_OR_DEPARTMENT_OTHER): Payer: Self-pay

## 2022-04-25 ENCOUNTER — Other Ambulatory Visit: Payer: Self-pay

## 2022-04-25 ENCOUNTER — Emergency Department (HOSPITAL_BASED_OUTPATIENT_CLINIC_OR_DEPARTMENT_OTHER)
Admission: EM | Admit: 2022-04-25 | Discharge: 2022-04-26 | Disposition: A | Discharge status: Home / Self Care | Payer: Medicare Other | Attending: Emergency Medicine | Admitting: Emergency Medicine

## 2022-04-25 ENCOUNTER — Emergency Department (HOSPITAL_BASED_OUTPATIENT_CLINIC_OR_DEPARTMENT_OTHER): Payer: Medicare Other | Admitting: Radiology

## 2022-04-25 ENCOUNTER — Emergency Department (HOSPITAL_BASED_OUTPATIENT_CLINIC_OR_DEPARTMENT_OTHER): Payer: Medicare Other

## 2022-04-25 DIAGNOSIS — S52615A Nondisplaced fracture of left ulna styloid process, initial encounter for closed fracture: Secondary | ICD-10-CM | POA: Diagnosis not present

## 2022-04-25 DIAGNOSIS — S0990XA Unspecified injury of head, initial encounter: Secondary | ICD-10-CM | POA: Diagnosis not present

## 2022-04-25 DIAGNOSIS — S62111A Displaced fracture of triquetrum [cuneiform] bone, right wrist, initial encounter for closed fracture: Secondary | ICD-10-CM | POA: Diagnosis not present

## 2022-04-25 DIAGNOSIS — M7989 Other specified soft tissue disorders: Secondary | ICD-10-CM | POA: Diagnosis not present

## 2022-04-25 DIAGNOSIS — S52352A Displaced comminuted fracture of shaft of radius, left arm, initial encounter for closed fracture: Secondary | ICD-10-CM | POA: Diagnosis not present

## 2022-04-25 DIAGNOSIS — S2231XA Fracture of one rib, right side, initial encounter for closed fracture: Secondary | ICD-10-CM | POA: Diagnosis not present

## 2022-04-25 DIAGNOSIS — S99911A Unspecified injury of right ankle, initial encounter: Secondary | ICD-10-CM | POA: Diagnosis not present

## 2022-04-25 DIAGNOSIS — R519 Headache, unspecified: Secondary | ICD-10-CM | POA: Insufficient documentation

## 2022-04-25 DIAGNOSIS — W19XXXA Unspecified fall, initial encounter: Secondary | ICD-10-CM

## 2022-04-25 DIAGNOSIS — S62114A Nondisplaced fracture of triquetrum [cuneiform] bone, right wrist, initial encounter for closed fracture: Secondary | ICD-10-CM | POA: Diagnosis not present

## 2022-04-25 DIAGNOSIS — T1490XA Injury, unspecified, initial encounter: Secondary | ICD-10-CM | POA: Diagnosis not present

## 2022-04-25 DIAGNOSIS — W1830XA Fall on same level, unspecified, initial encounter: Secondary | ICD-10-CM | POA: Insufficient documentation

## 2022-04-25 DIAGNOSIS — M8588 Other specified disorders of bone density and structure, other site: Secondary | ICD-10-CM | POA: Diagnosis not present

## 2022-04-25 DIAGNOSIS — S62115A Nondisplaced fracture of triquetrum [cuneiform] bone, left wrist, initial encounter for closed fracture: Secondary | ICD-10-CM | POA: Diagnosis not present

## 2022-04-25 DIAGNOSIS — S62112A Displaced fracture of triquetrum [cuneiform] bone, left wrist, initial encounter for closed fracture: Secondary | ICD-10-CM | POA: Diagnosis not present

## 2022-04-25 DIAGNOSIS — R109 Unspecified abdominal pain: Secondary | ICD-10-CM | POA: Diagnosis not present

## 2022-04-25 DIAGNOSIS — I700 Atherosclerosis of aorta: Secondary | ICD-10-CM | POA: Diagnosis not present

## 2022-04-25 DIAGNOSIS — N200 Calculus of kidney: Secondary | ICD-10-CM | POA: Diagnosis not present

## 2022-04-25 DIAGNOSIS — M25532 Pain in left wrist: Secondary | ICD-10-CM | POA: Diagnosis present

## 2022-04-25 DIAGNOSIS — M25571 Pain in right ankle and joints of right foot: Secondary | ICD-10-CM | POA: Diagnosis not present

## 2022-04-25 LAB — CBC WITH DIFFERENTIAL/PLATELET
Abs Immature Granulocytes: 0.05 K/uL (ref 0.00–0.07)
Basophils Absolute: 0 K/uL (ref 0.0–0.1)
Basophils Relative: 0 %
Eosinophils Absolute: 0.1 K/uL (ref 0.0–0.5)
Eosinophils Relative: 1 %
HCT: 33 % — ABNORMAL LOW (ref 36.0–46.0)
Hemoglobin: 12 g/dL (ref 12.0–15.0)
Immature Granulocytes: 0 %
Lymphocytes Relative: 9 %
Lymphs Abs: 1 K/uL (ref 0.7–4.0)
MCH: 28.4 pg (ref 26.0–34.0)
MCHC: 36.4 g/dL — ABNORMAL HIGH (ref 30.0–36.0)
MCV: 78.2 fL — ABNORMAL LOW (ref 80.0–100.0)
Monocytes Absolute: 0.7 K/uL (ref 0.1–1.0)
Monocytes Relative: 6 %
Neutro Abs: 9.4 K/uL — ABNORMAL HIGH (ref 1.7–7.7)
Neutrophils Relative %: 84 %
Platelets: 243 K/uL (ref 150–400)
RBC: 4.22 MIL/uL (ref 3.87–5.11)
RDW: 13.8 % (ref 11.5–15.5)
WBC: 11.2 K/uL — ABNORMAL HIGH (ref 4.0–10.5)
nRBC: 0 % (ref 0.0–0.2)

## 2022-04-25 LAB — PROTIME-INR
INR: 1.1 (ref 0.8–1.2)
Prothrombin Time: 14.5 s (ref 11.4–15.2)

## 2022-04-25 LAB — BASIC METABOLIC PANEL
Anion gap: 10 (ref 5–15)
BUN: 13 mg/dL (ref 8–23)
CO2: 22 mmol/L (ref 22–32)
Calcium: 9.2 mg/dL (ref 8.9–10.3)
Chloride: 103 mmol/L (ref 98–111)
Creatinine, Ser: 0.51 mg/dL (ref 0.44–1.00)
GFR, Estimated: >60 mL/min (ref >60)
Glucose, Bld: 105 mg/dL — ABNORMAL HIGH (ref 70–99)
Potassium: 3.7 mmol/L (ref 3.5–5.1)
Sodium: 135 mmol/L (ref 135–145)

## 2022-04-25 MED ORDER — HYDROCODONE-ACETAMINOPHEN 5-325 MG PO TABS
1.0000 | ORAL_TABLET | Freq: Once | ORAL | Status: AC
  Administered 2022-04-25: 1 via ORAL
  Filled 2022-04-25: qty 1

## 2022-04-25 MED ORDER — IBUPROFEN 800 MG PO TABS
800.0000 mg | ORAL_TABLET | Freq: Once | ORAL | Status: AC | PRN
  Administered 2022-04-25: 800 mg via ORAL
  Filled 2022-04-25: qty 1

## 2022-04-25 NOTE — ED Triage Notes (Signed)
Patient here POV from Home.  Endorses Fall Yesterday PM when she was pushing a [employer] and it went forward. Fell onto her Left Wrist/Arm. Pain to Left Wrist/Forearm/Elbow.   No LOC. No Known Head Injury. No Anticoagulants.   NAD Noted during Triage. A&Ox4. GCS 15. Ambulatory.

## 2022-04-25 NOTE — ED Provider Notes (Signed)
[facility] Provider Note   CSN: [id number] Arrival date & time: 04/25/22  1421     History {Add pertinent medical, surgical, social history, OB history to HPI:1} Chief Complaint  Patient presents with   Connie Snyder is a 89 y.o. female.   Fall       Home Medications Prior to Admission medications   Medication Sig Start Date End Date Taking? Authorizing Provider  feeding supplement (BOOST HIGH PROTEIN) LIQD Take 1 Container by mouth daily.    [provider]  ferrous sulfate 325 (65 FE) MG tablet Take 325 mg by mouth daily with breakfast.    [provider]  furosemide (LASIX) 20 MG tablet Take 20 mg by mouth daily as needed for fluid.    [provider]  hydrOXYzine (ATARAX) 25 MG tablet Take 25 mg by mouth at bedtime. 04/07/22   [provider]  Multiple Vitamins-Minerals (MULTIVITAMIN WOMEN 50+) TABS Take 1 tablet by mouth daily with breakfast.    [provider]  potassium chloride (KLOR-CON) 10 MEQ tablet Take 10 mEq by mouth daily as needed (when taking furosemide).    [provider]      Allergies    Aricept [donepezil]    Review of Systems   Review of Systems  Physical Exam Updated Vital Signs BP (!) 140/82 (BP Location: Right Arm)   Pulse 66   Temp 97.9 F (36.6 C) (Temporal)   Resp 18   Ht 5\' 4"  (1.626 m)   Wt 50.6 kg   SpO2 100%   BMI 19.15 kg/m  Physical Exam  ED Results / Procedures / Treatments   Labs (all labs ordered are listed, but only abnormal results are displayed) Labs Reviewed - No data to display  EKG None  Radiology DG Forearm Left  Result Date: 04/25/2022 CLINICAL DATA:  Fall.  Left thumb pain radiating up the left arm. EXAM: LEFT HAND - COMPLETE 3+ VIEW; LEFT FOREARM - 2 VIEW COMPARISON:  None Available. FINDINGS: There is diffuse decreased bone mineralization. Left hand: Severe thumb carpometacarpal joint space  narrowing, subchondral sclerosis, subchondral cystic change, and peripheral osteophytosis. There is chronic narrowing of the craniocaudal dimension of the trapezium. Moderate to severe thumb interphalangeal joint space narrowing and peripheral osteophytosis. Moderate to severe second through fifth DIP joint space narrowing. Possible mild early central erosive changes within the fourth DIP joints. Mild-to-moderate second through fifth interphalangeal and third metacarpophalangeal joint space narrowing. Mild triscaphe joint space narrowing. Mild-to-moderate distal radioulnar joint space narrowing and peripheral osteophytosis. There are two adjacent 3 mm ossicles just dorsal to the lunate on lateral view with moderate overlying dorsal soft tissue swelling, suspicious for a proximal carpal fracture with mild displacement, possibly the triquetrum. Left forearm: Mild-to-moderate medial and lateral elbow joint space narrowing and peripheral degenerative spurring. No acute fracture is seen within the radius or ulna. IMPRESSION: 1. There are two adjacent 3 mm ossicles just dorsal to the proximal carpal row on lateral view with moderate overlying dorsal soft tissue swelling. This is suspicious for a proximal carpal mildly displaced acute fracture, possibly the triquetrum. 2. 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 Electronically Signed   By: Yvonne Kendall M.D.   On: 04/25/2022 15:16   DG Hand Complete Left  Result Date: 04/25/2022 CLINICAL DATA:  Fall.  Left thumb pain radiating up the left arm. EXAM: LEFT HAND - COMPLETE 3+ VIEW; LEFT FOREARM -  2 VIEW COMPARISON:  None Available. FINDINGS: There is diffuse decreased bone mineralization. Left hand: Severe thumb carpometacarpal joint space narrowing, subchondral sclerosis, subchondral cystic change, and peripheral osteophytosis. There is chronic narrowing of the craniocaudal dimension of the trapezium. Moderate to severe  thumb interphalangeal joint space narrowing and peripheral osteophytosis. Moderate to severe second through fifth DIP joint space narrowing. Possible mild early central erosive changes within the fourth DIP joints. Mild-to-moderate second through fifth interphalangeal and third metacarpophalangeal joint space narrowing. Mild triscaphe joint space narrowing. Mild-to-moderate distal radioulnar joint space narrowing and peripheral osteophytosis. There are two adjacent 3 mm ossicles just dorsal to the lunate on lateral view with moderate overlying dorsal soft tissue swelling, suspicious for a proximal carpal fracture with mild displacement, possibly the triquetrum. Left forearm: Mild-to-moderate medial and lateral elbow joint space narrowing and peripheral degenerative spurring. No acute fracture is seen within the radius or ulna. IMPRESSION: 1. There are two adjacent 3 mm ossicles just dorsal to the proximal carpal row on lateral view with moderate overlying dorsal soft tissue swelling. This is suspicious for a proximal carpal mildly displaced acute fracture, possibly the triquetrum. 2. 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 Electronically Signed   By: Yvonne Kendall M.D.   On: 04/25/2022 15:16    Procedures Procedures  {Document cardiac monitor, telemetry assessment procedure when appropriate:1}  Medications Ordered in ED Medications  ibuprofen (ADVIL) tablet 800 mg (800 mg Oral Given 04/25/22 1744)    ED Course/ Medical Decision Making/ A&P   {   Click here for ABCD2, HEART and other calculatorsREFRESH Note before signing :1}                          Medical Decision Making Risk Prescription drug management.   ***  {Document critical care time when appropriate:1} {Document review of labs and clinical decision tools ie heart score, Chads2Vasc2 etc:1}  {Document your independent review of radiology images, and any outside records:1} {Document  your discussion with family members, caretakers, and with consultants:1} {Document social determinants of health affecting pt's care:1} {Document your decision making why or why not admission, treatments were needed:1} Final Clinical Impression(s) / ED Diagnoses Final diagnoses:  None    Rx / DC Orders ED Discharge Orders     None

## 2022-04-25 NOTE — ED Notes (Signed)
MD at bedside. 

## 2022-04-25 NOTE — ED Notes (Signed)
Pt ambulatory to hallway w/assistance x 2 and walker, MD notified.

## 2022-04-26 MED ORDER — OXYCODONE-ACETAMINOPHEN 5-325 MG PO TABS: 1.0000 | ORAL_TABLET | Freq: Three times a day (TID) | ORAL | 0 refills | Status: DC | PRN

## 2022-04-26 NOTE — Discharge Instructions (Addendum)
Your imaging showed bilateral wrist fractures, which we have immobilized. Please follow-up with orthopedics regarding this. Pain medicine has been prescribed. Recommend rest, ice, elevation of the extremity, and tylenol and opiates for pain control. You have old rib fractures, an old pelvic fracture, and old compression fractures in the spine.

## 2022-04-26 NOTE — ED Notes (Signed)
Dr. Armandina Gemma at bedside.

## 2022-04-28 ENCOUNTER — Ambulatory Visit: Payer: Medicare Other | Admitting: Orthopaedic Surgery

## 2022-04-28 DIAGNOSIS — S62102A Fracture of unspecified carpal bone, left wrist, initial encounter for closed fracture: Secondary | ICD-10-CM

## 2022-04-28 DIAGNOSIS — M25512 Pain in left shoulder: Secondary | ICD-10-CM | POA: Diagnosis not present

## 2022-04-28 DIAGNOSIS — G8929 Other chronic pain: Secondary | ICD-10-CM

## 2022-04-28 NOTE — Progress Notes (Signed)
Office Visit Note   Patient: Connie Snyder           Date of Birth: 1927/08/28           MRN: [id number] Visit Date: 04/28/2022              Requested by: Merrilee Seashore, [address] [place],  [place] [address] PCP: Merrilee Seashore, MD   Assessment & Plan: Visit Diagnoses:  1. Left wrist fracture, closed, initial encounter   2. Chronic left shoulder pain     Plan: Impression is chronic nonunion of left proximal humerus and left ulnar styloid fracture and avulsion fracture triquetrum.  Will place her in a sling to help support the shoulder since it sounds like it has been aggravated.  For the wrist and we will immobilize in a Velcro wrist splint and wear for couple weeks and wean as tolerated.  Questions encouraged and answered.  Over-the-counter medications for pain.  Ice and elevate.  Follow-up as needed.  Follow-Up Instructions: No follow-ups on file.   Orders:  No orders of the defined types were placed in this encounter.  No orders of the defined types were placed in this encounter.     Procedures: No procedures performed   Clinical Data: No additional findings.   Subjective: Chief Complaint  Patient presents with   Left Wrist - Pain    HPI  Patient is a 89 year old female with vascular dementia comes in for evaluation of left wrist fracture and aggravation of left shoulder pain.  Had a mechanical fall Sunday night.  Was evaluated in the ER.  Review of Systems  Constitutional: Negative.   HENT: Negative.    Eyes: Negative.   Respiratory: Negative.    Cardiovascular: Negative.   Endocrine: Negative.   Musculoskeletal: Negative.   Neurological: Negative.   Hematological: Negative.   Psychiatric/Behavioral: Negative.    All other systems reviewed and are negative.    Objective: Vital Signs: There were no vitals taken for this visit.  Physical Exam Vitals and nursing note reviewed.  Constitutional:      Appearance:  She is well-developed.  HENT:     Head: Normocephalic and atraumatic.  Pulmonary:     Effort: Pulmonary effort is normal.  Abdominal:     Palpations: Abdomen is soft.  Musculoskeletal:     Cervical back: Neck supple.  Skin:    General: Skin is warm.     Capillary Refill: Capillary refill takes less than 2 seconds.  Neurological:     Mental Status: She is alert and oriented to person, place, and time.  Psychiatric:        Behavior: Behavior normal.        Thought Content: Thought content normal.        Judgment: Judgment normal.     Ortho Exam Examination of left shoulder is consistent with a chronic nonunion of the proximal humerus. Examination of left wrist shows moderate swelling.  Slight tenderness to the ulnar styloid and the dorsal aspect of the carpus. Specialty Comments:  No specialty comments available.  Imaging: No results found.   PMFS History: Patient Active Problem List   Diagnosis Date Noted   Chest pain 06/03/2021   Syncope, vasovagal 06/03/2021   Aspiration pneumonia ([place]) 06/03/2021   History of pulmonary embolism 06/03/2021   Pulmonary emboli ([place]) 04/27/2021   Thigh hematoma 04/26/2021   Acute blood loss anemia 04/26/2021   Rheumatoid arthritis ([place]) 04/26/2021   Altered mental status  11/08/2018   Confusion    Vascular dementia without behavioral disturbance (HCC)    Normocytic normochromic anemia 12/22/2017   S/p left hip fracture 12/22/2017   Malnutrition of moderate degree 12/22/2017   Closed fracture of left hip ([place]) 12/21/2017   Closed fracture of left proximal humerus 11/30/2017   Past Medical History:  Diagnosis Date   Arthritis    Pulmonary embolism (HCC)    Sjogren's disease ([place])     Family History  Problem Relation Age of Onset   Pulmonary embolism Other     Past Surgical History:  Procedure Laterality Date   TOTAL HIP ARTHROPLASTY Left 12/22/2017   Procedure: TOTAL HIP ARTHROPLASTY ANTERIOR APPROACH;  Surgeon: Leandrew Koyanagi, MD;  Location: [facility];  Service: Orthopedics;  Laterality: Left;   Social History   Occupational History   Not on file  Tobacco Use   Smoking status: Former   Smokeless tobacco: Never  Vaping Use   Vaping Use: Never used  Substance and Sexual Activity   Alcohol use: Yes    Comment: weekly   Drug use: Never   Sexual activity: Not on file

## 2022-05-03 ENCOUNTER — Telehealth: Payer: Self-pay

## 2022-05-03 ENCOUNTER — Telehealth: Payer: Self-pay | Admitting: Orthopaedic Surgery

## 2022-05-03 NOTE — Telephone Encounter (Signed)
     Patient  visit on 04/26/2022  at [facility] was for fall, initial encounter.  Have you been able to follow up with your primary care physician? Patient has seen Orthopedic physician.  The patient was or was not able to obtain any needed medicine or equipment. Patient obtained medication.  Are there diet recommendations that you are having difficulty following? No. Patients son expressed interest in receiving a food pantry list. Verified home address to send food pantry list.  Patient expresses understanding of discharge instructions and education provided has no other needs at this time.    [facility] Resource Care Guide   [email or web address]  ?? [phone]   Website: [email or web address]

## 2022-05-03 NOTE — Telephone Encounter (Signed)
Patient wants a pain meds refilled percocet that was given to her by the ED [phone]

## 2022-05-04 ENCOUNTER — Other Ambulatory Visit: Payer: Self-pay | Admitting: Physician Assistant

## 2022-05-04 MED ORDER — OXYCODONE-ACETAMINOPHEN 5-325 MG PO TABS: 1.0000 | ORAL_TABLET | Freq: Three times a day (TID) | ORAL | 0 refills | Status: DC | PRN

## 2022-05-04 NOTE — Telephone Encounter (Signed)
Sent.  When does she come in for f/u

## 2022-05-04 NOTE — Telephone Encounter (Signed)
Ok gotcha

## 2022-07-20 DIAGNOSIS — M069 Rheumatoid arthritis, unspecified: Secondary | ICD-10-CM | POA: Diagnosis not present

## 2022-07-20 DIAGNOSIS — I700 Atherosclerosis of aorta: Secondary | ICD-10-CM | POA: Diagnosis not present

## 2022-07-20 DIAGNOSIS — Z23 Encounter for immunization: Secondary | ICD-10-CM | POA: Diagnosis not present

## 2022-07-20 DIAGNOSIS — Z0000 Encounter for general adult medical examination without abnormal findings: Secondary | ICD-10-CM | POA: Diagnosis not present

## 2022-07-20 DIAGNOSIS — Z86711 Personal history of pulmonary embolism: Secondary | ICD-10-CM | POA: Diagnosis not present

## 2022-07-20 DIAGNOSIS — R600 Localized edema: Secondary | ICD-10-CM | POA: Diagnosis not present

## 2022-07-20 DIAGNOSIS — I38 Endocarditis, valve unspecified: Secondary | ICD-10-CM | POA: Diagnosis not present

## 2022-07-20 DIAGNOSIS — M3500 Sicca syndrome, unspecified: Secondary | ICD-10-CM | POA: Diagnosis not present

## 2022-07-20 DIAGNOSIS — D508 Other iron deficiency anemias: Secondary | ICD-10-CM | POA: Diagnosis not present

## 2022-08-12 DIAGNOSIS — M25461 Effusion, right knee: Secondary | ICD-10-CM | POA: Diagnosis not present

## 2022-08-12 DIAGNOSIS — M25561 Pain in right knee: Secondary | ICD-10-CM | POA: Diagnosis not present

## 2022-08-12 DIAGNOSIS — M170 Bilateral primary osteoarthritis of knee: Secondary | ICD-10-CM | POA: Diagnosis not present

## 2022-08-12 DIAGNOSIS — M0589 Other rheumatoid arthritis with rheumatoid factor of multiple sites: Secondary | ICD-10-CM | POA: Diagnosis not present

## 2022-08-12 DIAGNOSIS — Z79899 Other long term (current) drug therapy: Secondary | ICD-10-CM | POA: Diagnosis not present

## 2022-08-12 DIAGNOSIS — M3500 Sicca syndrome, unspecified: Secondary | ICD-10-CM | POA: Diagnosis not present

## 2022-08-12 DIAGNOSIS — M549 Dorsalgia, unspecified: Secondary | ICD-10-CM | POA: Diagnosis not present

## 2023-01-18 DIAGNOSIS — I38 Endocarditis, valve unspecified: Secondary | ICD-10-CM | POA: Diagnosis not present

## 2023-01-18 DIAGNOSIS — R531 Weakness: Secondary | ICD-10-CM | POA: Diagnosis not present

## 2023-01-18 DIAGNOSIS — M3500 Sicca syndrome, unspecified: Secondary | ICD-10-CM | POA: Diagnosis not present

## 2023-01-18 DIAGNOSIS — M069 Rheumatoid arthritis, unspecified: Secondary | ICD-10-CM | POA: Diagnosis not present

## 2023-01-18 DIAGNOSIS — Z23 Encounter for immunization: Secondary | ICD-10-CM | POA: Diagnosis not present

## 2023-01-18 DIAGNOSIS — Z86711 Personal history of pulmonary embolism: Secondary | ICD-10-CM | POA: Diagnosis not present

## 2023-01-18 DIAGNOSIS — R600 Localized edema: Secondary | ICD-10-CM | POA: Diagnosis not present

## 2023-01-18 DIAGNOSIS — I700 Atherosclerosis of aorta: Secondary | ICD-10-CM | POA: Diagnosis not present

## 2023-01-18 DIAGNOSIS — D508 Other iron deficiency anemias: Secondary | ICD-10-CM | POA: Diagnosis not present

## 2023-02-15 DIAGNOSIS — M25561 Pain in right knee: Secondary | ICD-10-CM | POA: Diagnosis not present

## 2023-02-15 DIAGNOSIS — M3500 Sicca syndrome, unspecified: Secondary | ICD-10-CM | POA: Diagnosis not present

## 2023-02-15 DIAGNOSIS — Z79899 Other long term (current) drug therapy: Secondary | ICD-10-CM | POA: Diagnosis not present

## 2023-02-15 DIAGNOSIS — M0589 Other rheumatoid arthritis with rheumatoid factor of multiple sites: Secondary | ICD-10-CM | POA: Diagnosis not present

## 2023-02-15 DIAGNOSIS — Z23 Encounter for immunization: Secondary | ICD-10-CM | POA: Diagnosis not present

## 2023-02-15 DIAGNOSIS — M170 Bilateral primary osteoarthritis of knee: Secondary | ICD-10-CM | POA: Diagnosis not present

## 2023-02-15 DIAGNOSIS — M25461 Effusion, right knee: Secondary | ICD-10-CM | POA: Diagnosis not present

## 2023-02-15 DIAGNOSIS — M549 Dorsalgia, unspecified: Secondary | ICD-10-CM | POA: Diagnosis not present

## 2023-03-28 ENCOUNTER — Emergency Department (HOSPITAL_BASED_OUTPATIENT_CLINIC_OR_DEPARTMENT_OTHER): Payer: Medicare Other

## 2023-03-28 ENCOUNTER — Encounter (HOSPITAL_BASED_OUTPATIENT_CLINIC_OR_DEPARTMENT_OTHER): Payer: Self-pay

## 2023-03-28 ENCOUNTER — Other Ambulatory Visit: Payer: Self-pay

## 2023-03-28 ENCOUNTER — Inpatient Hospital Stay (HOSPITAL_BASED_OUTPATIENT_CLINIC_OR_DEPARTMENT_OTHER)
Admission: EM | Admit: 2023-03-28 | Discharge: 2023-04-05 | DRG: 194 | Disposition: A | Discharge status: Home Health | Payer: Medicare Other | Attending: Internal Medicine | Admitting: Internal Medicine

## 2023-03-28 DIAGNOSIS — Z96642 Presence of left artificial hip joint: Secondary | ICD-10-CM | POA: Diagnosis not present

## 2023-03-28 DIAGNOSIS — Z79899 Other long term (current) drug therapy: Secondary | ICD-10-CM | POA: Diagnosis not present

## 2023-03-28 DIAGNOSIS — Z87891 Personal history of nicotine dependence: Secondary | ICD-10-CM

## 2023-03-28 DIAGNOSIS — D649 Anemia, unspecified: Secondary | ICD-10-CM | POA: Diagnosis present

## 2023-03-28 DIAGNOSIS — J189 Pneumonia, unspecified organism: Secondary | ICD-10-CM | POA: Diagnosis present

## 2023-03-28 DIAGNOSIS — Z888 Allergy status to other drugs, medicaments and biological substances status: Secondary | ICD-10-CM

## 2023-03-28 DIAGNOSIS — E440 Moderate protein-calorie malnutrition: Secondary | ICD-10-CM | POA: Diagnosis not present

## 2023-03-28 DIAGNOSIS — I951 Orthostatic hypotension: Secondary | ICD-10-CM | POA: Diagnosis present

## 2023-03-28 DIAGNOSIS — Z1152 Encounter for screening for COVID-19: Secondary | ICD-10-CM | POA: Diagnosis not present

## 2023-03-28 DIAGNOSIS — D62 Acute posthemorrhagic anemia: Secondary | ICD-10-CM | POA: Diagnosis present

## 2023-03-28 DIAGNOSIS — R918 Other nonspecific abnormal finding of lung field: Secondary | ICD-10-CM | POA: Diagnosis not present

## 2023-03-28 DIAGNOSIS — E876 Hypokalemia: Secondary | ICD-10-CM | POA: Diagnosis present

## 2023-03-28 DIAGNOSIS — M1990 Unspecified osteoarthritis, unspecified site: Secondary | ICD-10-CM | POA: Diagnosis not present

## 2023-03-28 DIAGNOSIS — Z86711 Personal history of pulmonary embolism: Secondary | ICD-10-CM | POA: Diagnosis not present

## 2023-03-28 DIAGNOSIS — M85812 Other specified disorders of bone density and structure, left shoulder: Secondary | ICD-10-CM | POA: Diagnosis present

## 2023-03-28 DIAGNOSIS — M3500 Sicca syndrome, unspecified: Secondary | ICD-10-CM | POA: Diagnosis not present

## 2023-03-28 DIAGNOSIS — M84422K Pathological fracture, left humerus, subsequent encounter for fracture with nonunion: Secondary | ICD-10-CM | POA: Diagnosis not present

## 2023-03-28 DIAGNOSIS — R079 Chest pain, unspecified: Secondary | ICD-10-CM | POA: Diagnosis not present

## 2023-03-28 DIAGNOSIS — Z681 Body mass index (BMI) 19 or less, adult: Secondary | ICD-10-CM

## 2023-03-28 DIAGNOSIS — R9431 Abnormal electrocardiogram [ECG] [EKG]: Secondary | ICD-10-CM | POA: Diagnosis not present

## 2023-03-28 DIAGNOSIS — H9190 Unspecified hearing loss, unspecified ear: Secondary | ICD-10-CM | POA: Diagnosis present

## 2023-03-28 DIAGNOSIS — B9789 Other viral agents as the cause of diseases classified elsewhere: Secondary | ICD-10-CM | POA: Diagnosis not present

## 2023-03-28 DIAGNOSIS — K921 Melena: Secondary | ICD-10-CM | POA: Diagnosis present

## 2023-03-28 DIAGNOSIS — Z7901 Long term (current) use of anticoagulants: Secondary | ICD-10-CM

## 2023-03-28 DIAGNOSIS — I2489 Other forms of acute ischemic heart disease: Secondary | ICD-10-CM | POA: Diagnosis present

## 2023-03-28 DIAGNOSIS — F0150 Vascular dementia without behavioral disturbance: Secondary | ICD-10-CM | POA: Diagnosis present

## 2023-03-28 DIAGNOSIS — J159 Unspecified bacterial pneumonia: Secondary | ICD-10-CM | POA: Diagnosis not present

## 2023-03-28 DIAGNOSIS — E860 Dehydration: Secondary | ICD-10-CM | POA: Diagnosis not present

## 2023-03-28 DIAGNOSIS — K922 Gastrointestinal hemorrhage, unspecified: Secondary | ICD-10-CM | POA: Diagnosis not present

## 2023-03-28 DIAGNOSIS — I454 Nonspecific intraventricular block: Secondary | ICD-10-CM | POA: Diagnosis not present

## 2023-03-28 DIAGNOSIS — M79602 Pain in left arm: Secondary | ICD-10-CM | POA: Diagnosis not present

## 2023-03-28 DIAGNOSIS — D519 Vitamin B12 deficiency anemia, unspecified: Secondary | ICD-10-CM | POA: Diagnosis not present

## 2023-03-28 DIAGNOSIS — Z7982 Long term (current) use of aspirin: Secondary | ICD-10-CM

## 2023-03-28 LAB — CBC
HCT: 23.7 % — ABNORMAL LOW (ref 36.0–46.0)
Hemoglobin: 8 g/dL — ABNORMAL LOW (ref 12.0–15.0)
MCH: 24.9 pg — ABNORMAL LOW (ref 26.0–34.0)
MCHC: 33.8 g/dL (ref 30.0–36.0)
MCV: 73.8 fL — ABNORMAL LOW (ref 80.0–100.0)
Platelets: 318 10*3/uL (ref 150–400)
RBC: 3.21 MIL/uL — ABNORMAL LOW (ref 3.87–5.11)
RDW: 14.9 % (ref 11.5–15.5)
WBC: 10.1 10*3/uL (ref 4.0–10.5)
nRBC: 0 % (ref 0.0–0.2)

## 2023-03-28 LAB — BASIC METABOLIC PANEL
Anion gap: 10 (ref 5–15)
BUN: 22 mg/dL (ref 8–23)
CO2: 23 mmol/L (ref 22–32)
Calcium: 8.9 mg/dL (ref 8.9–10.3)
Chloride: 103 mmol/L (ref 98–111)
Creatinine, Ser: 0.63 mg/dL (ref 0.44–1.00)
GFR, Estimated: >60 mL/min (ref >60)
Glucose, Bld: 105 mg/dL — ABNORMAL HIGH (ref 70–99)
Potassium: 3.7 mmol/L (ref 3.5–5.1)
Sodium: 136 mmol/L (ref 135–145)

## 2023-03-28 LAB — TROPONIN I (HIGH SENSITIVITY): Troponin I (High Sensitivity): 12 ng/L (ref <18)

## 2023-03-28 NOTE — ED Provider Notes (Incomplete)
[place] [facility] AT [facility] Provider Note   CSN: [id number] Arrival date & time: 03/28/23  2116     History {Add pertinent medical, surgical, social history, OB history to HPI:1} Chief Complaint  Patient presents with   Arm Pain    Connie Snyder is a 89 y.o. female.  HPI    89 year old female comes in with chief complaint of arm pain.  Patient has history of Sjogren's disease, PE for which she is on blood thinners.  She does not have any history of coronary artery disease.  She has fracture to her left shoulder that is chronic in nature.  Patient has dementia.  She is accompanied by her son.  According the son, this evening patient started complaining of left arm pain.  They gave her some medication, but her pain continued therefore they gave her aspirin and brought her to the ER.  Patient currently states that she has no pain to her chest, back.  She states that she does have arm pain, but it is worse when she moves.  She is unsure if the pain is new or old.  Patient denies any associated nausea, shortness of breath, sweating.  Home Medications Prior to Admission medications   Medication Sig Start Date End Date Taking? Authorizing Provider  feeding supplement (BOOST HIGH PROTEIN) LIQD Take 1 Container by mouth daily.    [provider]  ferrous sulfate 325 (65 FE) MG tablet Take 325 mg by mouth daily with breakfast.    [provider]  furosemide (LASIX) 20 MG tablet Take 20 mg by mouth daily as needed for fluid.    [provider]  hydrOXYzine (ATARAX) 25 MG tablet Take 25 mg by mouth at bedtime. 04/07/22   [provider]  Multiple Vitamins-Minerals (MULTIVITAMIN WOMEN 50+) TABS Take 1 tablet by mouth daily with breakfast.    [provider]  oxyCODONE-acetaminophen (PERCOCET/ROXICET) 5-325 MG tablet Take 1 tablet by mouth every 8 (eight) hours as needed for severe pain. 05/04/22   Cristie Hem, PA-C   potassium chloride (KLOR-CON) 10 MEQ tablet Take 10 mEq by mouth daily as needed (when taking furosemide).    [provider]      Allergies    Aricept [donepezil]    Review of Systems   Review of Systems  All other systems reviewed and are negative.   Physical Exam Updated Vital Signs BP (!) 147/82 (BP Location: Right Arm)   Pulse 92   Temp 99.1 F (37.3 C) (Oral)   Resp (!) 24   Ht 5\' 4"  (1.626 m)   Wt 52.4 kg   SpO2 100%   BMI 19.84 kg/m  Physical Exam Vitals and nursing note reviewed.  Constitutional:      Appearance: She is well-developed.  HENT:     Head: Atraumatic.  Eyes:     Extraocular Movements: Extraocular movements intact.     Pupils: Pupils are equal, round, and reactive to light.  Cardiovascular:     Rate and Rhythm: Normal rate.  Pulmonary:     Effort: Pulmonary effort is normal.  Musculoskeletal:        General: Tenderness and deformity present.     Cervical back: Normal range of motion and neck supple.     Comments: Left shoulder deformity and tenderness appreciated with movement of the left shoulder  Skin:    General: Skin is warm and dry.  Neurological:     Mental Status: She is alert and  oriented to person, place, and time.     ED Results / Procedures / Treatments   Labs (all labs ordered are listed, but only abnormal results are displayed) Labs Reviewed - No data to display  EKG EKG Interpretation Date/Time:  Tuesday March 28 2023 21:36:43 EST Ventricular Rate:  87 PR Interval:  189 QRS Duration:  120 QT Interval:  388 QTC Calculation: 467 R Axis:   -69  Text Interpretation: Sinus or ectopic atrial rhythm Left anterior fascicular block Extensive anterior infarct, acute (LAD) ST elevation, consider inferior injury anterior ST elevation noted Inferior ST elevation noted Confirmed by Derwood Kaplan [phone]) on 03/28/2023 9:46:00 PM    EKG Interpretation Date/Time:  Tuesday March 28 2023 21:41:21 EST Ventricular  Rate:  82 PR Interval:  155 QRS Duration:  119 QT Interval:  391 QTC Calculation: 457 R Axis:   -66  Text Interpretation: Sinus rhythm LAD, consider left anterior fascicular block Anteroseptal infarct, acute (LAD) Lateral leads are also involved ST elevation appears early repo, unchanged Confirmed by Derwood Kaplan [phone]) on 03/28/2023 9:47:07 PM        Radiology No results found.  Procedures Procedures  {Document cardiac monitor, telemetry assessment procedure when appropriate:1}  Medications Ordered in ED Medications - No data to display  ED Course/ Medical Decision Making/ A&P   {   Click here for ABCD2, HEART and other calculatorsREFRESH Note before signing :1}                              Medical Decision Making Amount and/or Complexity of Data Reviewed Labs: ordered. Radiology: ordered.   89 year old female comes in with chief complaint of left arm pain.  History provided by patient's son, was at the bedside.  Patient has history of dementia, PE, Sjogren's syndrome.  No history of CAD.  Patient is not the best historian.  It does not appear that she has any chest pain, shortness of breath, back pain, jaw pain, neck pain.  She does mention arm pain, but is not clear about chronicity of this pain.  The pain appears to be worse with movement.  The differential diagnosis considered for this patient includes  ACS syndrome Aortic dissection PE Pneumothorax Musculoskeletal pain PUD / Gastritis / Esophagitis Esophageal spasm   Initial EKG has ST elevations. However, it does not appear that she has OMI.  Appears most likely that she has early repull.  Repeat EKG is unchanged.  Plan is to get basic labs, chest x-ray and troponins. At this time, anticipate discharge.  Final Clinical Impression(s) / ED Diagnoses Final diagnoses:  None    Rx / DC Orders ED Discharge Orders     None

## 2023-03-28 NOTE — ED Triage Notes (Signed)
Son reports patient complaining of sudden sharp pain in middle of left arm about 2 hours ago. Son reports increased confusion in the past 2 hours of well. Son states mild confusion at baseline, but more than usual currently.

## 2023-03-29 ENCOUNTER — Observation Stay (HOSPITAL_COMMUNITY): Payer: Medicare Other

## 2023-03-29 DIAGNOSIS — H9190 Unspecified hearing loss, unspecified ear: Secondary | ICD-10-CM | POA: Diagnosis present

## 2023-03-29 DIAGNOSIS — M19012 Primary osteoarthritis, left shoulder: Secondary | ICD-10-CM | POA: Diagnosis not present

## 2023-03-29 DIAGNOSIS — D649 Anemia, unspecified: Secondary | ICD-10-CM | POA: Diagnosis not present

## 2023-03-29 DIAGNOSIS — M85812 Other specified disorders of bone density and structure, left shoulder: Secondary | ICD-10-CM | POA: Diagnosis present

## 2023-03-29 DIAGNOSIS — E440 Moderate protein-calorie malnutrition: Secondary | ICD-10-CM | POA: Diagnosis present

## 2023-03-29 DIAGNOSIS — S4292XA Fracture of left shoulder girdle, part unspecified, initial encounter for closed fracture: Secondary | ICD-10-CM | POA: Diagnosis not present

## 2023-03-29 DIAGNOSIS — B9789 Other viral agents as the cause of diseases classified elsewhere: Secondary | ICD-10-CM | POA: Diagnosis present

## 2023-03-29 DIAGNOSIS — D62 Acute posthemorrhagic anemia: Secondary | ICD-10-CM | POA: Diagnosis not present

## 2023-03-29 DIAGNOSIS — J8489 Other specified interstitial pulmonary diseases: Secondary | ICD-10-CM | POA: Diagnosis not present

## 2023-03-29 DIAGNOSIS — J159 Unspecified bacterial pneumonia: Secondary | ICD-10-CM | POA: Diagnosis present

## 2023-03-29 DIAGNOSIS — Z7901 Long term (current) use of anticoagulants: Secondary | ICD-10-CM | POA: Diagnosis not present

## 2023-03-29 DIAGNOSIS — I951 Orthostatic hypotension: Secondary | ICD-10-CM | POA: Diagnosis present

## 2023-03-29 DIAGNOSIS — E876 Hypokalemia: Secondary | ICD-10-CM | POA: Diagnosis present

## 2023-03-29 DIAGNOSIS — Z86711 Personal history of pulmonary embolism: Secondary | ICD-10-CM | POA: Diagnosis not present

## 2023-03-29 DIAGNOSIS — F0150 Vascular dementia without behavioral disturbance: Secondary | ICD-10-CM | POA: Diagnosis present

## 2023-03-29 DIAGNOSIS — Z681 Body mass index (BMI) 19 or less, adult: Secondary | ICD-10-CM | POA: Diagnosis not present

## 2023-03-29 DIAGNOSIS — R079 Chest pain, unspecified: Secondary | ICD-10-CM | POA: Diagnosis not present

## 2023-03-29 DIAGNOSIS — S2241XA Multiple fractures of ribs, right side, initial encounter for closed fracture: Secondary | ICD-10-CM | POA: Diagnosis not present

## 2023-03-29 DIAGNOSIS — Z888 Allergy status to other drugs, medicaments and biological substances status: Secondary | ICD-10-CM | POA: Diagnosis not present

## 2023-03-29 DIAGNOSIS — S42202A Unspecified fracture of upper end of left humerus, initial encounter for closed fracture: Secondary | ICD-10-CM | POA: Diagnosis not present

## 2023-03-29 DIAGNOSIS — E860 Dehydration: Secondary | ICD-10-CM | POA: Diagnosis present

## 2023-03-29 DIAGNOSIS — Z87891 Personal history of nicotine dependence: Secondary | ICD-10-CM | POA: Diagnosis not present

## 2023-03-29 DIAGNOSIS — D519 Vitamin B12 deficiency anemia, unspecified: Secondary | ICD-10-CM | POA: Diagnosis present

## 2023-03-29 DIAGNOSIS — I2489 Other forms of acute ischemic heart disease: Secondary | ICD-10-CM | POA: Diagnosis present

## 2023-03-29 DIAGNOSIS — J189 Pneumonia, unspecified organism: Secondary | ICD-10-CM | POA: Diagnosis not present

## 2023-03-29 DIAGNOSIS — M3500 Sicca syndrome, unspecified: Secondary | ICD-10-CM | POA: Diagnosis present

## 2023-03-29 DIAGNOSIS — I454 Nonspecific intraventricular block: Secondary | ICD-10-CM | POA: Diagnosis present

## 2023-03-29 DIAGNOSIS — K922 Gastrointestinal hemorrhage, unspecified: Secondary | ICD-10-CM | POA: Diagnosis not present

## 2023-03-29 DIAGNOSIS — K921 Melena: Secondary | ICD-10-CM | POA: Diagnosis not present

## 2023-03-29 DIAGNOSIS — Z79899 Other long term (current) drug therapy: Secondary | ICD-10-CM | POA: Diagnosis not present

## 2023-03-29 DIAGNOSIS — M79602 Pain in left arm: Secondary | ICD-10-CM | POA: Diagnosis present

## 2023-03-29 DIAGNOSIS — M1990 Unspecified osteoarthritis, unspecified site: Secondary | ICD-10-CM | POA: Diagnosis present

## 2023-03-29 DIAGNOSIS — M84422K Pathological fracture, left humerus, subsequent encounter for fracture with nonunion: Secondary | ICD-10-CM | POA: Diagnosis present

## 2023-03-29 DIAGNOSIS — Z96642 Presence of left artificial hip joint: Secondary | ICD-10-CM | POA: Diagnosis present

## 2023-03-29 DIAGNOSIS — Z1152 Encounter for screening for COVID-19: Secondary | ICD-10-CM | POA: Diagnosis not present

## 2023-03-29 LAB — CBC
HCT: 24.7 % — ABNORMAL LOW (ref 36.0–46.0)
HCT: 22.1 % — ABNORMAL LOW (ref 36.0–46.0)
HCT: 24.2 % — ABNORMAL LOW (ref 36.0–46.0)
Hemoglobin: 8.3 g/dL — ABNORMAL LOW (ref 12.0–15.0)
Hemoglobin: 7.8 g/dL — ABNORMAL LOW (ref 12.0–15.0)
Hemoglobin: 8.2 g/dL — ABNORMAL LOW (ref 12.0–15.0)
MCH: 25.2 pg — ABNORMAL LOW (ref 26.0–34.0)
MCH: 26.2 pg (ref 26.0–34.0)
MCH: 25.1 pg — ABNORMAL LOW (ref 26.0–34.0)
MCHC: 33.6 g/dL (ref 30.0–36.0)
MCHC: 35.3 g/dL (ref 30.0–36.0)
MCHC: 33.9 g/dL (ref 30.0–36.0)
MCV: 75.1 fL — ABNORMAL LOW (ref 80.0–100.0)
MCV: 74.2 fL — ABNORMAL LOW (ref 80.0–100.0)
MCV: 74 fL — ABNORMAL LOW (ref 80.0–100.0)
Platelets: 309 10*3/uL (ref 150–400)
Platelets: 304 10*3/uL (ref 150–400)
Platelets: 255 10*3/uL (ref 150–400)
RBC: 3.29 MIL/uL — ABNORMAL LOW (ref 3.87–5.11)
RBC: 2.98 MIL/uL — ABNORMAL LOW (ref 3.87–5.11)
RBC: 3.27 MIL/uL — ABNORMAL LOW (ref 3.87–5.11)
RDW: 15.1 % (ref 11.5–15.5)
RDW: 15.2 % (ref 11.5–15.5)
RDW: 15 % (ref 11.5–15.5)
WBC: 12.2 10*3/uL — ABNORMAL HIGH (ref 4.0–10.5)
WBC: 10.4 10*3/uL (ref 4.0–10.5)
WBC: 10.9 10*3/uL — ABNORMAL HIGH (ref 4.0–10.5)
nRBC: 0 % (ref 0.0–0.2)
nRBC: 0 % (ref 0.0–0.2)
nRBC: 0 % (ref 0.0–0.2)

## 2023-03-29 LAB — ECHOCARDIOGRAM COMPLETE
AR max vel: 1.5 cm2
AV Area VTI: 1.23 cm2
AV Area mean vel: 1.26 cm2
AV Mean grad: 14 mm[Hg]
AV Peak grad: 25.2 mm[Hg]
Ao pk vel: 2.51 m/s
Area-P 1/2: 2.01 cm2
Height: 64 in
S' Lateral: 2.4 cm
Weight: 1849.6 [oz_av]

## 2023-03-29 LAB — PROCALCITONIN: Procalcitonin: 0.53 ng/mL

## 2023-03-29 LAB — TROPONIN I (HIGH SENSITIVITY)
Troponin I (High Sensitivity): 32 ng/L — ABNORMAL HIGH (ref <18)
Troponin I (High Sensitivity): 38 ng/L — ABNORMAL HIGH (ref <18)
Troponin I (High Sensitivity): 38 ng/L — ABNORMAL HIGH (ref <18)

## 2023-03-29 LAB — GLUCOSE, CAPILLARY: Glucose-Capillary: 94 mg/dL (ref 70–99)

## 2023-03-29 LAB — STREP PNEUMONIAE URINARY ANTIGEN: Strep Pneumo Urinary Antigen: NEGATIVE

## 2023-03-29 MED ORDER — PANTOPRAZOLE SODIUM 40 MG IV SOLR
80.0000 mg | Freq: Once | INTRAVENOUS | Status: AC
  Administered 2023-03-29: 80 mg via INTRAVENOUS
  Filled 2023-03-29: qty 20

## 2023-03-29 MED ORDER — SODIUM CHLORIDE 0.9 % IV SOLN
500.0000 mg | INTRAVENOUS | Status: DC
  Administered 2023-03-30: 500 mg via INTRAVENOUS
  Filled 2023-03-29 (×2): qty 5

## 2023-03-29 MED ORDER — DOXYCYCLINE HYCLATE 100 MG PO TABS: 100.0000 mg | ORAL_TABLET | Freq: Two times a day (BID) | ORAL | Status: DC

## 2023-03-29 MED ORDER — ONDANSETRON HCL 4 MG PO TABS: 4.0000 mg | ORAL_TABLET | Freq: Four times a day (QID) | ORAL | Status: DC | PRN

## 2023-03-29 MED ORDER — BOOST / RESOURCE BREEZE PO LIQD CUSTOM
1.0000 | Freq: Two times a day (BID) | ORAL | Status: DC
  Administered 2023-03-30 – 2023-04-05 (×13): 1 via ORAL

## 2023-03-29 MED ORDER — SODIUM CHLORIDE 0.9 % IV SOLN
1.0000 g | INTRAVENOUS | Status: DC
  Filled 2023-03-29: qty 10

## 2023-03-29 MED ORDER — SODIUM CHLORIDE 0.9 % IV SOLN
500.0000 mg | Freq: Once | INTRAVENOUS | Status: AC
  Administered 2023-03-29: 500 mg via INTRAVENOUS
  Filled 2023-03-29: qty 5

## 2023-03-29 MED ORDER — HYDRALAZINE HCL 20 MG/ML IJ SOLN: 10.0000 mg | Freq: Four times a day (QID) | INTRAMUSCULAR | Status: DC | PRN

## 2023-03-29 MED ORDER — ACETAMINOPHEN 650 MG RE SUPP
650.0000 mg | Freq: Four times a day (QID) | RECTAL | Status: DC | PRN
  Administered 2023-03-29 – 2023-03-30 (×2): 650 mg via RECTAL
  Filled 2023-03-29 (×2): qty 1

## 2023-03-29 MED ORDER — ONDANSETRON HCL 4 MG/2ML IJ SOLN
4.0000 mg | Freq: Four times a day (QID) | INTRAMUSCULAR | Status: DC | PRN
Start: 2023-03-29 — End: 2023-04-05

## 2023-03-29 MED ORDER — PANTOPRAZOLE SODIUM 40 MG IV SOLR
40.0000 mg | Freq: Two times a day (BID) | INTRAVENOUS | Status: DC
  Administered 2023-03-29 – 2023-03-31 (×6): 40 mg via INTRAVENOUS
  Filled 2023-03-29 (×6): qty 10

## 2023-03-29 MED ORDER — SODIUM CHLORIDE 0.9 % IV SOLN
1.0000 g | INTRAVENOUS | Status: DC
  Administered 2023-03-29 – 2023-03-30 (×2): 1 g via INTRAVENOUS
  Filled 2023-03-29 (×2): qty 10

## 2023-03-29 MED ORDER — SODIUM CHLORIDE 0.9 % IV SOLN
1.0000 g | Freq: Once | INTRAVENOUS | Status: AC
  Administered 2023-03-29: 1 g via INTRAVENOUS
  Filled 2023-03-29: qty 10

## 2023-03-29 MED ORDER — ACETAMINOPHEN 325 MG PO TABS: 650.0000 mg | ORAL_TABLET | Freq: Four times a day (QID) | ORAL | Status: DC | PRN

## 2023-03-29 MED ORDER — AZITHROMYCIN 500 MG PO TABS: 500.0000 mg | ORAL_TABLET | Freq: Every day | ORAL | Status: DC

## 2023-03-29 MED ORDER — OXYCODONE HCL 5 MG PO TABS: 5.0000 mg | ORAL_TABLET | ORAL | Status: DC | PRN

## 2023-03-29 MED ORDER — MIRTAZAPINE 15 MG PO TABS
15.0000 mg | ORAL_TABLET | Freq: Every day | ORAL | Status: DC
Start: 2023-03-29 — End: 2023-04-05
  Administered 2023-03-30 – 2023-04-04 (×6): 15 mg via ORAL
  Filled 2023-03-29 (×7): qty 1

## 2023-03-29 NOTE — Progress Notes (Addendum)
*  PRELIMINARY RESULTS* Echocardiogram 2D Echocardiogram has been performed.  Stacey Drain 03/29/2023, 1:18 PM

## 2023-03-29 NOTE — Evaluation (Signed)
Clinical/Bedside Swallow Evaluation Patient Details  Name: Connie Snyder MRN: [id number] Date of Birth: [birth year]  Today's Date: 03/29/2023 Time: SLP Start Time (ACUTE ONLY): 1324 SLP Stop Time (ACUTE ONLY): 1342 SLP Time Calculation (min) (ACUTE ONLY): 18 min  Past Medical History:  Past Medical History:  Diagnosis Date   Arthritis    Pulmonary embolism (HCC)    Sjogren's disease (HCC)    Past Surgical History:  Past Surgical History:  Procedure Laterality Date   TOTAL HIP ARTHROPLASTY Left 12/22/2017   Procedure: TOTAL HIP ARTHROPLASTY ANTERIOR APPROACH;  Surgeon: Tarry Kos, MD;  Location: [facility] OR;  Service: Orthopedics;  Laterality: Left;   HPI:  Connie Snyder is a 89 y.o. female with medical history significant of arthritis, pulmonary embolism on Xarelto, Sjogren's, dementia who presented to the emergency department due to left arm pain. Underwent chest x-ray which showed airspace opacity in right upper lobe concerning for pneumonia. Per chart hemoglobin is trended and she was started on twice daily PPI due to dark tarry stool.  GI was consulted. BSE 06/03/2021 endentulous and described difficulty chewing exhibiting prolonged seemingly effortful chewing with bite sized pieces graham cracker, cleared her mouth with applesauce, recommended Dys 2 texture, consistent with previous evaluation or near to baseline and no acute needs were identified and ST signed off.    Assessment / Plan / Recommendation  Clinical Impression  Pt was lethargic but was able to wake adequately for swallow assessment  although needing frequent stimuli to maintain. Pt is endentulous without dentures and son states she eats sometimes softer foods at home (spaghetti, inside of chicken pot pie) and states she does not cough when eating or drinking. She was unable to adequately perform oromotor exam without asymmetry noted on observation. She was able to masticate graham cracker and soft green bean with prolonged  mastication and was able to clear her oral cavity. Sips thin liquid consumed via straw without indications of airway intrusion over multiple trials. SLP downgraded her diet to Dys 2 (minced), continue thin liquids and crush meds (son states at home she takes meds with water) in upright position when adequatley alert. ST will follow up with her briefly for safety and efficiency (uncertain if she will be able to upgrade texture during acute hospitalization). SLP Visit Diagnosis: Dysphagia, unspecified (R13.10)    Aspiration Risk  Mild aspiration risk    Diet Recommendation Dysphagia 2 (Fine chop);Thin liquid    Liquid Administration via: Straw Medication Administration: Crushed with puree Supervision: Staff to assist with self feeding;Full supervision/cueing for compensatory strategies Compensations: Slow rate;Small sips/bites Postural Changes: Seated upright at 90 degrees    Other  Recommendations Oral Care Recommendations: Oral care BID    Recommendations for follow up therapy are one component of a multi-disciplinary discharge planning process, led by the attending physician.  Recommendations may be updated based on patient status, additional functional criteria and insurance authorization.  Follow up Recommendations  (TBD)      Assistance Recommended at Discharge    Functional Status Assessment Patient has had a recent decline in their functional status and demonstrates the ability to make significant improvements in function in a reasonable and predictable amount of time.  Frequency and Duration min 1 x/week  1 week       Prognosis Prognosis for improved oropharyngeal function:  (fair-good)      Swallow Study   General Date of Onset: 03/28/23 HPI: Connie Snyder is a 89 y.o. female with medical history significant  of arthritis, pulmonary embolism on Xarelto, Sjogren's, dementia who presented to the emergency department due to left arm pain. Underwent chest x-ray which showed  airspace opacity in right upper lobe concerning for pneumonia. Per chart hemoglobin is trended and she was started on twice daily PPI due to dark tarry stool.  GI was consulted. BSE 06/03/2021 endentulous and described difficulty chewing exhibiting prolonged seemingly effortful chewing with bite sized pieces graham cracker, cleared her mouth with applesauce, recommended Dys 2 texture, consistent with previous evaluation or near to baseline and no acute needs were identified and ST signed off. Type of Study: Bedside Swallow Evaluation Previous Swallow Assessment:  (see HPI) Diet Prior to this Study: Other (Comment);Thin liquids (Level 0) ("soft") Temperature Spikes Noted: No Respiratory Status: Room air History of Recent Intubation: No Behavior/Cognition: Lethargic/Drowsy;Cooperative;Requires cueing Oral Cavity Assessment:  (?lingual candidas) Oral Care Completed by SLP: No Oral Cavity - Dentition: Edentulous Vision: Functional for self-feeding Self-Feeding Abilities: Needs assist (due to lethargy) Patient Positioning: Upright in bed Baseline Vocal Quality: Normal Volitional Cough: Cognitively unable to elicit Volitional Swallow: Unable to elicit    Oral/Motor/Sensory Function Overall Oral Motor/Sensory Function:  (brief assessment due to lethargy)   Ice Chips Ice chips: Not tested   Thin Liquid Thin Liquid: Within functional limits Presentation: Straw    Nectar Thick Nectar Thick Liquid: Not tested   Honey Thick Honey Thick Liquid: Not tested   Puree Puree: Within functional limits   Solid     Solid: Impaired Oral Phase Functional Implications: Prolonged oral transit (prolonged mastication) Pharyngeal Phase Impairments:  (none)      Keric Zehren, Breck Coons 03/29/2023,2:13 PM

## 2023-03-29 NOTE — ED Notes (Signed)
[facility] at bedside packaging pt for transport. Report given to [facility].

## 2023-03-29 NOTE — Progress Notes (Signed)
[facility]                                                                              Connie Snyder, is a 89 y.o. female, DOB - 12/10/1927, [id number] Admit date - 03/28/2023    Outpatient Primary MD for the patient is Georgianne Fick, MD  LOS - 0  days  Chief Complaint  Patient presents with   Arm Pain       Brief summary   Patient is a 89 year old female with history of arthritis, pulmonary embolism on Xarelto, Sjogren syndrome, dementia presented with confusion and left arm pain.  Also reported dark tarry stools. In ED, troponin 12-32, WBCs 10.1, hemoglobin 8 (baseline 10-12), Chest x-ray showed right upper lobe airspace opacity concerning for pneumonia. Patient was admitted for further workup.  Assessment & Plan    Principal Problem:   Community acquired pneumonia of right upper lobe of lung -Presented with confusion, left arm pain, found to have right upper lobe airspace opacity concerning for pneumonia -Spiking fevers, 101.2 F, worsening leukocytosis,  -discontinue the doxycycline and placed on  IV Zithromax, Rocephin -Obtain blood cultures, procalcitonin, urine Legionella antigen, urine strep antigen -SLP evaluation to rule out aspiration  Active Problems: Elevated troponins -Likely due to demand ischemia in the setting of #1, anemia -Troponin 12-> 32-> 38-> 38, flat -Obtain 2D echo, currently no acute chest pain or shortness of breath  Acute on chronic normocytic normochromic anemia, ABLA likely secondary to upper GIB -Baseline hemoglobin 10-12, presented with hemoglobin of 8.0 - GI consulted, recommended supportive care with IV PPI for now -Resume anticoagulation in 48 hours if no further bleeding, obtain serial H&H.  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   Weight as of this encounter: 52.4 kg.  Code Status: Full code DVT Prophylaxis:  SCDs Start: 03/29/23 0427   Level of Care: Level of care: Telemetry Family Communication:  Disposition Plan:      Remains inpatient appropriate:      Procedures:    Consultants:   Gastroenterology  Antimicrobials:   Anti-infectives (From admission, onward)    Start     Dose/Rate Route Frequency Ordered Stop   03/30/23 0800  azithromycin (ZITHROMAX) 500 mg in sodium chloride 0.9 % 250 mL IVPB        500 mg 250 mL/hr over 60 Minutes Intravenous Every 24 hours 03/29/23 0627     03/29/23 2200  cefTRIAXone (ROCEPHIN) 1 g in sodium chloride 0.9 % 100 mL IVPB  1 g 200 mL/hr over 30 Minutes Intravenous Every 24 hours 03/29/23 0627     03/29/23 1000  azithromycin (ZITHROMAX) tablet 500 mg  Status:  Discontinued        500 mg Oral Daily 03/29/23 0226 03/29/23 0448   03/29/23 1000  doxycycline (VIBRA-TABS) tablet 100 mg  Status:  Discontinued        100 mg Oral Every 12 hours 03/29/23 0448 03/29/23 0627   03/29/23 0915  cefTRIAXone (ROCEPHIN) 1 g in sodium chloride 0.9 % 100 mL IVPB  Status:  Discontinued        1 g 200 mL/hr over 30 Minutes Intravenous Every 24 hours 03/29/23 0226 03/29/23 0448   03/29/23 0045  cefTRIAXone (ROCEPHIN) 1 g in sodium chloride 0.9 % 100 mL IVPB        1 g 200 mL/hr over 30 Minutes Intravenous  Once 03/29/23 0035 03/29/23 0140   03/29/23 0045  azithromycin (ZITHROMAX) 500 mg in  sodium chloride 0.9 % 250 mL IVPB        500 mg 250 mL/hr over 60 Minutes Intravenous  Once 03/29/23 0035 03/29/23 0555          Medications  pantoprazole (PROTONIX) IV  40 mg Intravenous Q12H      Subjective:   Connie Snyder was seen and examined today.  Difficult to obtain ROS from the patient due to dementia.  Appears comfortable.  Did spike fevers this morning, 101.2 F.  No ongoing, chest pain, acute shortness of breath, nausea or vomiting.  No diarrhea.   Objective:   Vitals:   03/29/23 0141 03/29/23 0248 03/29/23 0649 03/29/23 0959  BP:  129/73 (!) 142/78 (!) 153/96  Pulse:  82 78 78  Resp:  19 20 18   Temp: 98.8 F (37.1 C) 98.3 F (36.8 C) 98.7 F (37.1 C) (!) 101.2 F (38.4 C)  TempSrc:  Oral Oral Oral  SpO2:  100% 97% 98%  Weight:      Height:        Intake/Output Summary (Last 24 hours) at 03/29/2023 1138 Last data filed at 03/29/2023 0300 Gross per 24 hour  Intake 350 ml  Output --  Net 350 ml     Wt Readings from Last 3 Encounters:  03/28/23 52.4 kg  04/25/22 50.6 kg  04/12/22 50.6 kg     Exam General: Alert and oriented x self, NAD, appears comfortable Cardiovascular: S1 S2 auscultated,  RRR Respiratory: Diminished breath sound at the bases Gastrointestinal: Soft, nontender, nondistended, + bowel sounds Ext: no pedal edema bilaterally Neuro: does not follow commands, moving all 4 extremities spontaneously Psych: dementia    Data Reviewed:  I have personally reviewed following labs    CBC Lab Results  Component Value Date   WBC 12.2 (H) 03/29/2023   RBC 3.29 (L) 03/29/2023   HGB 8.3 (L) 03/29/2023   HCT 24.7 (L) 03/29/2023   MCV 75.1 (L) 03/29/2023   MCH 25.2 (L) 03/29/2023   PLT 309 03/29/2023   MCHC 33.6 03/29/2023   RDW 15.1 03/29/2023   LYMPHSABS 1.0 04/25/2022   MONOABS 0.7 04/25/2022   EOSABS 0.1 04/25/2022   BASOSABS 0.0 04/25/2022     Last metabolic panel Lab Results  Component Value Date   NA 136 03/28/2023    K 3.7 03/28/2023   CL 103 03/28/2023   CO2 23 03/28/2023   BUN 22 03/28/2023   CREATININE 0.63 03/28/2023   GLUCOSE 105 (H) 03/28/2023   GFRNONAA >60 03/28/2023  GFRAA >60 11/09/2018   CALCIUM 8.9 03/28/2023   PROT 6.1 (L) 04/26/2021   ALBUMIN 3.1 (L) 04/26/2021   BILITOT 0.8 04/26/2021   ALKPHOS 66 04/26/2021   AST 24 04/26/2021   ALT 17 04/26/2021   ANIONGAP 10 03/28/2023    CBG (last 3)  No results for input(s): "GLUCAP" in the last 72 hours.    Coagulation Profile: No results for input(s): "INR", "PROTIME" in the last 168 hours.   Radiology Studies: I have personally reviewed the imaging studies  DG Shoulder 1V Left Result Date: 03/29/2023 CLINICAL DATA:  144754 with bilateral shoulder pain, question recent injury. Patient has dementia and cannot provide reliable history. EXAM: LEFT SHOULDER; RIGHT SHOULDER - 1 VIEW COMPARISON:  No prior right shoulder series. Comparison made with the left shoulder series dated 08/15/2019. FINDINGS: Right shoulder, single AP internal rotation view: There are short linear calcifications alongside the greater tuberosity which could be due to rotator cuff calcific tendinopathy or tiny chip fractures. There is osteopenia without further single-view evidence of fractures. No dislocation is seen.  There are mild spurs at the [facility] joint. There are healed fracture deformities of several right-sided ribs. No infiltrate in the visualized right lung. Perihilar linear atelectasis or scarring. Left shoulder, single AP view: Again noted is a chronic proximal left humeral shaft fracture with an atrophic nonunion and scattered bony debris between the main fracture fragments. The distal fragment again is displaced medially by greater than 1 bone width and laterally angulated, on the prior study was also anteriorly displaced and mildly anteriorly angulated. Osteopenia is present without evidence of new fracture on this single view. There is slight spurring of the [facility]  joint. No infiltrate in the peripheral left lung. IMPRESSION: 1. Short linear calcifications alongside the right greater tuberosity which could be due to rotator cuff calcific tendinopathy or tiny chip fractures. 2. Osteopenia without further single-view evidence of fractures. 3. 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 The distal fragment again is displaced medially by greater than 1 bone width and laterally angulated, on the prior study was also anteriorly displaced and mildly anteriorly angulated. 4. Mild degenerative changes of the AC joints bilaterally. Electronically Signed   By: Almira Bar M.D.   On: 03/29/2023 06:05   DG Shoulder 1V Right Result Date: 03/29/2023 CLINICAL DATA:  [id number] with bilateral shoulder pain, question recent injury. Patient has dementia and cannot provide reliable history. EXAM: LEFT SHOULDER; RIGHT SHOULDER - 1 VIEW COMPARISON:  No prior right shoulder series. Comparison made with the left shoulder series dated 08/15/2019. FINDINGS: Right shoulder, single AP internal rotation view: There are short linear calcifications alongside the greater tuberosity which could be due to rotator cuff calcific tendinopathy or tiny chip fractures. There is osteopenia without further single-view evidence of fractures. No dislocation is seen.  There are mild spurs at the [facility] joint. There are healed fracture deformities of several right-sided ribs. No infiltrate in the visualized right lung. Perihilar linear atelectasis or scarring. Left shoulder, single AP view: Again noted is a chronic proximal left humeral shaft fracture with an atrophic nonunion and scattered bony debris between the main fracture fragments. The distal fragment again is displaced medially by greater than 1 bone width and laterally angulated, on the prior study was also anteriorly displaced and mildly anteriorly angulated. Osteopenia is  present without evidence of new fracture on this single view. There is slight spurring of the [facility] joint.  No infiltrate in the peripheral left lung. IMPRESSION: 1. Short linear calcifications alongside the right greater tuberosity which could be due to rotator cuff calcific tendinopathy or tiny chip fractures. 2. Osteopenia without further single-view evidence of fractures. 3. 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 The distal fragment again is displaced medially by greater than 1 bone width and laterally angulated, on the prior study was also anteriorly displaced and mildly anteriorly angulated. 4. Mild degenerative changes of the AC joints bilaterally. Electronically Signed   By: Almira Bar M.D.   On: 03/29/2023 06:05   DG Chest Portable 1 View Result Date: 03/28/2023 CLINICAL DATA:  Left arm pain EXAM: PORTABLE CHEST 1 VIEW COMPARISON:  04/25/2022 FINDINGS: Heart and mediastinal contours within normal limits. Left lung clear. Airspace opacity throughout the right upper lobe. No effusions. No acute bony abnormality IMPRESSION: Airspace opacity throughout the right upper lobe concerning for pneumonia. Electronically Signed   By: Charlett Nose M.D.   On: 03/28/2023 22:38       Milas Schappell M.D. [facility] 03/29/2023, 11:38 AM  Available via Epic secure chat 7am-7pm After 7 pm, please refer to night coverage provider listed on amion.

## 2023-03-29 NOTE — Consult Note (Signed)
Referring Provider: TH Primary Care Physician:  Georgianne Fick, MD Primary Gastroenterologist: Gentry Fitz  Reason for Consultation: Anemia, melena, anticoagulation use  HPI: Connie Snyder is a 89 y.o. female with past medical history of pulmonary embolism on Xarelto, history of Sjogren syndrome presented to the hospital with arm pain.  Initial workup showed drop in hemoglobin to 8.  Patient was complaining of melena.  Currently being treated for community-acquired pneumonia.  GI is consulted for further evaluation.  Patient seen and examined at bedside.  She is not able to provide detailed history.  Discussed with RN at bedside.  Denies any further melanotic stool today.   Past Medical History:  Diagnosis Date   Arthritis    Pulmonary embolism (HCC)    Sjogren's disease (HCC)     Past Surgical History:  Procedure Laterality Date   TOTAL HIP ARTHROPLASTY Left 12/22/2017   Procedure: TOTAL HIP ARTHROPLASTY ANTERIOR APPROACH;  Surgeon: Tarry Kos, MD;  Location: [facility] OR;  Service: Orthopedics;  Laterality: Left;    Prior to Admission medications   Medication Sig Start Date End Date Taking? Authorizing Provider  ELIQUIS 2.5 MG TABS tablet Take 2.5 mg by mouth 2 (two) times daily.    [provider]  feeding supplement (BOOST HIGH PROTEIN) LIQD Take 1 Container by mouth daily.    [provider]  ferrous sulfate 325 (65 FE) MG tablet Take 325 mg by mouth daily with breakfast.    [provider]  furosemide (LASIX) 20 MG tablet Take 20 mg by mouth daily as needed for fluid.    [provider]  hydrOXYzine (ATARAX) 25 MG tablet Take 25 mg by mouth at bedtime. 04/07/22   [provider]  mirtazapine (REMERON) 15 MG tablet TAKE 1 TABLET BY MOUTH EVERY DAY AT BEDTIME FOR 90 DAYS for 90    [provider]  Multiple Vitamins-Minerals (MULTIVITAMIN WOMEN 50+) TABS Take 1 tablet by mouth daily with breakfast.    [provider]   oxyCODONE-acetaminophen (PERCOCET/ROXICET) 5-325 MG tablet Take 1 tablet by mouth every 8 (eight) hours as needed for severe pain. 05/04/22   Cristie Hem, PA-C  potassium chloride (KLOR-CON) 10 MEQ tablet Take 10 mEq by mouth daily as needed (when taking furosemide).    [provider]    Scheduled Meds:  pantoprazole (PROTONIX) IV  40 mg Intravenous Q12H   Continuous Infusions:  [START ON 03/30/2023] azithromycin     cefTRIAXone (ROCEPHIN)  IV     PRN Meds:.acetaminophen **OR** acetaminophen, ondansetron **OR** ondansetron (ZOFRAN) IV, oxyCODONE  Allergies as of 03/28/2023 - Review Complete 03/28/2023  Allergen Reaction Noted   Aricept [donepezil] Nausea And Vomiting 04/07/2021    Family History  Problem Relation Age of Onset   Pulmonary embolism Other     Social History   Socioeconomic History   Marital status: Widowed    Spouse name: Not on file   Number of children: Not on file   Years of education: Not on file   Highest education level: Not on file  Occupational History   Not on file  Tobacco Use   Smoking status: Former   Smokeless tobacco: Never  Vaping Use   Vaping status: Never Used  Substance and Sexual Activity   Alcohol use: Yes    Comment: weekly   Drug use: Never   Sexual activity: Not on file  Other Topics Concern   Not on file  Social History Narrative   Not on file  Social Drivers of Corporate investment banker Strain: Not on file  Food Insecurity: No Food Insecurity (05/03/2022)   Hunger Vital Sign    Worried About Running Out of Food in the Last Year: Never true    Ran Out of Food in the Last Year: Never true  Transportation Needs: Not on file  Physical Activity: Not on file  Stress: Not on file  Social Connections: Not on file  Intimate Partner Violence: Not on file    Review of Systems: All negative except as stated above in HPI.  Physical Exam: Vital signs: Vitals:   03/29/23 0248 03/29/23 0649  BP: 129/73 (!)  142/78  Pulse: 82 78  Resp: 19 20  Temp: 98.3 F (36.8 C) 98.7 F (37.1 C)  SpO2: 100% 97%     General:   Elderly patient, somewhat confused, not in acute distress Lungs: No visible respiratory distress Heart:  Regular rate and rhythm; no murmurs, clicks, rubs,  or gallops. Abdomen: Soft, nontender, nondistended, bowel sound present, no peritoneal signs Rectal:  Deferred  GI:  Lab Results: Recent Labs    03/28/23 2152 03/29/23 0503  WBC 10.1 12.2*  HGB 8.0* 8.3*  HCT 23.7* 24.7*  PLT 318 309   BMET Recent Labs    03/28/23 2152  NA 136  K 3.7  CL 103  CO2 23  GLUCOSE 105*  BUN 22  CREATININE 0.63  CALCIUM 8.9   LFT No results for input(s): "PROT", "ALBUMIN", "AST", "ALT", "ALKPHOS", "BILITOT", "BILIDIR", "IBILI" in the last 72 hours. PT/INR No results for input(s): "LABPROT", "INR" in the last 72 hours.   Studies/Results: DG Shoulder 1V Left Result Date: 03/29/2023 CLINICAL DATA:  [id number] with bilateral shoulder pain, question recent injury. Patient has dementia and cannot provide reliable history. EXAM: LEFT SHOULDER; RIGHT SHOULDER - 1 VIEW COMPARISON:  No prior right shoulder series. Comparison made with the left shoulder series dated 08/15/2019. FINDINGS: Right shoulder, single AP internal rotation view: There are short linear calcifications alongside the greater tuberosity which could be due to rotator cuff calcific tendinopathy or tiny chip fractures. There is osteopenia without further single-view evidence of fractures. No dislocation is seen.  There are mild spurs at the [facility] joint. There are healed fracture deformities of several right-sided ribs. No infiltrate in the visualized right lung. Perihilar linear atelectasis or scarring. Left shoulder, single AP view: Again noted is a chronic proximal left humeral shaft fracture with an atrophic nonunion and scattered bony debris between the main fracture fragments. The distal fragment again is displaced medially by  greater than 1 bone width and laterally angulated, on the prior study was also anteriorly displaced and mildly anteriorly angulated. Osteopenia is present without evidence of new fracture on this single view. There is slight spurring of the [facility] joint. No infiltrate in the peripheral left lung. IMPRESSION: 1. Short linear calcifications alongside the right greater tuberosity which could be due to rotator cuff calcific tendinopathy or tiny chip fractures. 2. Osteopenia without further single-view evidence of fractures. 3. 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 The distal fragment again is displaced medially by greater than 1 bone width and laterally angulated, on the prior study was also anteriorly displaced and mildly anteriorly angulated. 4. Mild degenerative changes of the AC joints bilaterally. Electronically Signed   By: Almira Bar M.D.   On: 03/29/2023 06:05   DG Shoulder 1V Right Result Date: 03/29/2023 CLINICAL DATA:  [id number] with bilateral shoulder pain, question recent injury. Patient has dementia and cannot provide reliable history. EXAM: LEFT SHOULDER; RIGHT SHOULDER - 1 VIEW COMPARISON:  No prior right shoulder series. Comparison made with the left shoulder series dated 08/15/2019. FINDINGS: Right shoulder, single AP internal rotation view: There are short linear calcifications alongside the greater tuberosity which could be due to rotator cuff calcific tendinopathy or tiny chip fractures. There is osteopenia without further single-view evidence of fractures. No dislocation is seen.  There are mild spurs at the [facility] joint. There are healed fracture deformities of several right-sided ribs. No infiltrate in the visualized right lung. Perihilar linear atelectasis or scarring. Left shoulder, single AP view: Again noted is a chronic proximal left humeral shaft fracture with an atrophic nonunion and scattered bony  debris between the main fracture fragments. The distal fragment again is displaced medially by greater than 1 bone width and laterally angulated, on the prior study was also anteriorly displaced and mildly anteriorly angulated. Osteopenia is present without evidence of new fracture on this single view. There is slight spurring of the [facility] joint. No infiltrate in the peripheral left lung. IMPRESSION: 1. Short linear calcifications alongside the right greater tuberosity which could be due to rotator cuff calcific tendinopathy or tiny chip fractures. 2. Osteopenia without further single-view evidence of fractures. 3. 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 The distal fragment again is displaced medially by greater than 1 bone width and laterally angulated, on the prior study was also anteriorly displaced and mildly anteriorly angulated. 4. Mild degenerative changes of the AC joints bilaterally. Electronically Signed   By: Almira Bar M.D.   On: 03/29/2023 06:05   DG Chest Portable 1 View Result Date: 03/28/2023 CLINICAL DATA:  Left arm pain EXAM: PORTABLE CHEST 1 VIEW COMPARISON:  04/25/2022 FINDINGS: Heart and mediastinal contours within normal limits. Left lung clear. Airspace opacity throughout the right upper lobe. No effusions. No acute bony abnormality IMPRESSION: Airspace opacity throughout the right upper lobe concerning for pneumonia. Electronically Signed   By: Charlett Nose M.D.   On: 03/28/2023 22:38    Impression/Plan: -Melena in a patient with history of pulmonary embolism was on Eliquis.  No further melanotic stool today per RN.  Could be gastritis versus ulcer disease.  Patient denies any abdominal pain, nausea or vomiting. -Blood loss anemia.  Likely secondary to above. -Pulmonary embolism.  Was on Eliquis -Community-acquired pneumonia  Recommendations ----------------------- -Patient's hemoglobin  is stable today.  Recommend supportive care with IV PPI for now.  Patient is 89 years old with pneumonia at this time  - recommend conservative management. -Start soft diet and slowly advance as tolerated. -Resume anticoagulation in 48 hours if no further bleeding.  Recommend to continue Protonix 40 mg once a day on discharge at least for 8 weeks. -Avoid NSAIDs -No inpatient GI workup planned at this time.  Call us back if needed.   LOS: 0 days   Kathi Der  MD, FACP 03/29/2023, 9:39 AM  Contact #  [phone]

## 2023-03-29 NOTE — Care Management Obs Status (Signed)
MEDICARE OBSERVATION STATUS NOTIFICATION   Patient Details  Name: Connie Snyder MRN: [id number] Date of Birth: [birth year]   Medicare Observation Status Notification Given:  Yes (Patient oriented to self only. Form left on shadow chart for son.)    Ewing Schlein, [place] 03/29/2023, 12:21 PM

## 2023-03-29 NOTE — H&P (Signed)
History and Physical    Connie Snyder:[id number] DOB: [birth year] DOA: 03/28/2023  PCP: Georgianne Fick, MD   Chief Complaint: arm pain  HPI: Connie Snyder is a 89 y.o. female with medical history significant of arthritis, pulmonary embolism on Xarelto, Sjogren's, dementia who presented to the emergency department due to left arm pain.  At [facility] she was endorsing persistent pain with movement.  She presented to the ER where she was found to be afebrile and hemodynamically stable.  There is a left shoulder deformity and labs were obtained which showed BMP unrevealing, troponin 12, 32 WBC 10.1, hemoglobin 8 baseline between 10 and 12.  Patient underwent chest x-ray which showed airspace opacity in right upper lobe concerning for pneumonia.  Patient was admitted further workup.  On evaluation she was unable to elucidate her symptoms.  She endorsed compliance with her home medications.  EKG showed left bundle.  Patient was admitted for further workup.  Hemoglobin is trended and she was started on twice daily PPI due to dark tarry stool.  GI was consulted.   Review of Systems: Review of Systems  Constitutional:  Negative for chills and fever.  HENT: Negative.    Respiratory: Negative.    Cardiovascular:  Positive for chest pain.  Gastrointestinal:  Positive for heartburn and melena. Negative for nausea and vomiting.  Musculoskeletal:  Positive for joint pain.  Skin: Negative.   Neurological: Negative.   Endo/Heme/Allergies: Negative.   Psychiatric/Behavioral: Negative.    All other systems reviewed and are negative.    As per HPI otherwise 10 point review of systems negative.   Allergies  Allergen Reactions   Aricept [Donepezil] Nausea And Vomiting    Past Medical History:  Diagnosis Date   Arthritis    Pulmonary embolism (HCC)    Sjogren's disease (HCC)     Past Surgical History:  Procedure Laterality Date   TOTAL HIP ARTHROPLASTY Left 12/22/2017   Procedure:  TOTAL HIP ARTHROPLASTY ANTERIOR APPROACH;  Surgeon: Tarry Kos, MD;  Location: [facility] OR;  Service: Orthopedics;  Laterality: Left;     reports that she has quit smoking. She has never used smokeless tobacco. She reports current alcohol use. She reports that she does not use drugs.  Family History  Problem Relation Age of Onset   Pulmonary embolism Other     Prior to Admission medications   Medication Sig Start Date End Date Taking? Authorizing Provider  feeding supplement (BOOST HIGH PROTEIN) LIQD Take 1 Container by mouth daily.    [provider]  ferrous sulfate 325 (65 FE) MG tablet Take 325 mg by mouth daily with breakfast.    [provider]  furosemide (LASIX) 20 MG tablet Take 20 mg by mouth daily as needed for fluid.    [provider]  hydrOXYzine (ATARAX) 25 MG tablet Take 25 mg by mouth at bedtime. 04/07/22   [provider]  Multiple Vitamins-Minerals (MULTIVITAMIN WOMEN 50+) TABS Take 1 tablet by mouth daily with breakfast.    [provider]  oxyCODONE-acetaminophen (PERCOCET/ROXICET) 5-325 MG tablet Take 1 tablet by mouth every 8 (eight) hours as needed for severe pain. 05/04/22   Cristie Hem, PA-C  potassium chloride (KLOR-CON) 10 MEQ tablet Take 10 mEq by mouth daily as needed (when taking furosemide).    [provider]    Physical Exam: Vitals:   03/29/23 0030 03/29/23 0130 03/29/23 0141 03/29/23 0248  BP: 108/61 106/71  129/73  Pulse:  80  82  Resp:  (!) 24  19  Temp:   98.8 F (37.1 C) 98.3 F (36.8 C)  TempSrc:    Oral  SpO2:  100%  100%  Weight:      Height:       Physical Exam Constitutional:      Appearance: She is normal weight.  HENT:     Head: Normocephalic.     Nose: Nose normal.     Mouth/Throat:     Mouth: Mucous membranes are moist.     Pharynx: Oropharynx is clear.  Eyes:     Conjunctiva/sclera: Conjunctivae normal.     Pupils: Pupils are equal, round, and reactive to light.   Cardiovascular:     Rate and Rhythm: Normal rate and regular rhythm.     Pulses: Normal pulses.     Heart sounds: Normal heart sounds.  Pulmonary:     Effort: Pulmonary effort is normal.     Breath sounds: Normal breath sounds.  Abdominal:     General: Abdomen is flat. Bowel sounds are normal.  Musculoskeletal:        General: Deformity present. Normal range of motion.     Cervical back: Normal range of motion.  Skin:    General: Skin is warm.     Capillary Refill: Capillary refill takes less than 2 seconds.  Neurological:     General: No focal deficit present.     Mental Status: She is alert. She is disoriented.  Psychiatric:        Mood and Affect: Mood normal.        Labs on Admission: I have personally reviewed the patients's labs and imaging studies.  Assessment/Plan Principal Problem:   Community acquired pneumonia of right upper lobe of lung   # Bacterial community acquired pneumonia, POA, active - Patient presented with chest pain found to have opacity in lungs on chest x-ray - No leukocytosis or other signs of infection Plan: Treat with doxycycline for 5 days  # Elevated troponin-likely related to demand in setting of ischemia.  Will plan to continue hemoglobin trend and check echocardiogram.  Suspicion for ACS is low.  There are some EKG changes however in setting of bundle branch block difficult to interpret  # Acute blood loss anemia most likely secondary to upper GI bleed-patient noted to have overt bleeding on exam with hemoglobin dropped from 12-8 in setting of anticoagulation for pulmonary embolism.  Twice daily PPI.  Check H. pylori stool antigen.  GI consulted for possible endoscopic evaluation though conservative therapy likely preferred due to comorbidities and age.  # History of PE-hold anticoagulation in setting of GI bleed  # Left shoulder pain-obtain x-rays to rule out fracture   Admission status: Observation Telemetry  Certification: The  appropriate patient status for this patient is OBSERVATION. Observation status is judged to be reasonable and necessary in order to provide the required intensity of service to ensure the patient's safety. The patient's presenting symptoms, physical exam findings, and initial radiographic and laboratory data in the context of their medical condition is felt to place them at decreased risk for further clinical deterioration. Furthermore, it is anticipated that the patient will be medically stable for discharge from the hospital within 2 midnights of admission.     Alan Mulder MD [facility] If 7PM-7AM, please contact night-coverage [email or web address]  03/29/2023, 4:41 AM

## 2023-03-30 DIAGNOSIS — K922 Gastrointestinal hemorrhage, unspecified: Secondary | ICD-10-CM

## 2023-03-30 DIAGNOSIS — J189 Pneumonia, unspecified organism: Secondary | ICD-10-CM | POA: Diagnosis not present

## 2023-03-30 DIAGNOSIS — Z7901 Long term (current) use of anticoagulants: Secondary | ICD-10-CM

## 2023-03-30 DIAGNOSIS — D649 Anemia, unspecified: Secondary | ICD-10-CM | POA: Diagnosis not present

## 2023-03-30 LAB — RESPIRATORY PANEL BY PCR

## 2023-03-30 LAB — CBC
HCT: 22.7 % — ABNORMAL LOW (ref 36.0–46.0)
Hemoglobin: 7.8 g/dL — ABNORMAL LOW (ref 12.0–15.0)
MCH: 25.6 pg — ABNORMAL LOW (ref 26.0–34.0)
MCHC: 34.4 g/dL (ref 30.0–36.0)
MCV: 74.4 fL — ABNORMAL LOW (ref 80.0–100.0)
Platelets: 146 10*3/uL — ABNORMAL LOW (ref 150–400)
RBC: 3.05 MIL/uL — ABNORMAL LOW (ref 3.87–5.11)
RDW: 15.4 % (ref 11.5–15.5)
WBC: 8.3 10*3/uL (ref 4.0–10.5)
nRBC: 0 % (ref 0.0–0.2)

## 2023-03-30 LAB — BASIC METABOLIC PANEL WITH GFR
Anion gap: 7 (ref 5–15)
BUN: 20 mg/dL (ref 8–23)
CO2: 21 mmol/L — ABNORMAL LOW (ref 22–32)
Calcium: 8 mg/dL — ABNORMAL LOW (ref 8.9–10.3)
Chloride: 103 mmol/L (ref 98–111)
Creatinine, Ser: 0.57 mg/dL (ref 0.44–1.00)
GFR, Estimated: >60 mL/min (ref >60)
Glucose, Bld: 100 mg/dL — ABNORMAL HIGH (ref 70–99)
Potassium: 3.1 mmol/L — ABNORMAL LOW (ref 3.5–5.1)
Sodium: 131 mmol/L — ABNORMAL LOW (ref 135–145)

## 2023-03-30 LAB — SARS CORONAVIRUS 2 BY RT PCR: SARS Coronavirus 2 by RT PCR: NEGATIVE

## 2023-03-30 MED ORDER — POTASSIUM CHLORIDE CRYS ER 20 MEQ PO TBCR
40.0000 meq | EXTENDED_RELEASE_TABLET | Freq: Once | ORAL | Status: AC
  Administered 2023-03-30: 40 meq via ORAL
  Filled 2023-03-30: qty 2

## 2023-03-30 NOTE — Progress Notes (Signed)
[facility]                                                                              Connie Snyder, is a 89 y.o. female, DOB - 26-Nov-1927, ZOX:[id number] Admit date - 03/28/2023    Outpatient Primary MD for the patient is Georgianne Fick, MD  LOS - 1  days  Chief Complaint  Patient presents with   Arm Pain       Brief summary   Patient is a 89 year old female with history of arthritis, pulmonary embolism on Xarelto, Sjogren syndrome, dementia presented with confusion and left arm pain.  Also reported dark tarry stools. In ED, troponin 12-32, WBCs 10.1, hemoglobin 8 (baseline 10-12), Chest x-ray showed right upper lobe airspace opacity concerning for pneumonia. Patient was admitted for further workup.  Assessment & Plan    Principal Problem:   Community acquired pneumonia of right upper lobe of lung -Presented with confusion, left arm pain, found to have right upper lobe airspace opacity concerning for pneumonia - procalcitonin 0.53, antibiotics changed to IV Zithromax, Rocephin  -Leukocytosis improving today  -Still spiking fevers, 102.9 F overnight, obtain respiratory virus panel, COVID/flu/RSV -Blood cultures 12/25 negative so far.   -urine strep antigen negative, follow urine Legionella antigen  Active Problems: Elevated troponins -Likely due to demand ischemia in the setting of #1, anemia -Troponin 12-> 32-> 38-> 38, flat -2D echo showed EF of 65 to 70%, no regional WMA, severe pulmonary artery systolic pressure, G1 DD,  Acute on chronic normocytic normochromic anemia, ABLA likely secondary to upper GIB -Baseline hemoglobin 10-12, presented with hemoglobin of 8.0 - GI consulted, recommended supportive care with IV PPI for now -Resume anticoagulation in 48 hours if no further bleeding, avoid NSAIDs. 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   Weight as of this encounter: 52.4 kg.  Code Status: Full code DVT Prophylaxis:  SCDs Start: 03/29/23 0427   Level of Care: Level of care: Telemetry Family Communication:  Disposition Plan:      Remains inpatient appropriate:      Procedures:    Consultants:   Gastroenterology  Antimicrobials:   Anti-infectives (From admission, onward)    Start     Dose/Rate Route Frequency Ordered Stop   03/30/23 0800  azithromycin (ZITHROMAX) 500 mg in sodium chloride 0.9 % 250 mL IVPB        500 mg 250 mL/hr over 60 Minutes Intravenous Every 24 hours 03/29/23 8119  03/29/23 2200  cefTRIAXone (ROCEPHIN) 1 g in sodium chloride 0.9 % 100 mL IVPB        1 g 200 mL/hr over 30 Minutes Intravenous Every 24 hours 03/29/23 0627     03/29/23 1000  azithromycin (ZITHROMAX) tablet 500 mg  Status:  Discontinued        500 mg Oral Daily 03/29/23 0226 03/29/23 0448   03/29/23 1000  doxycycline (VIBRA-TABS) tablet 100 mg  Status:  Discontinued        100 mg Oral Every 12 hours 03/29/23 0448 03/29/23 0627   03/29/23 0915  cefTRIAXone (ROCEPHIN) 1 g in sodium chloride 0.9 % 100 mL IVPB  Status:  Discontinued        1 g 200 mL/hr over 30 Minutes Intravenous Every 24 hours 03/29/23 0226 03/29/23 0448   03/29/23 0045  cefTRIAXone (ROCEPHIN) 1 g in sodium chloride 0.9 % 100 mL IVPB        1 g 200  mL/hr over 30 Minutes Intravenous  Once 03/29/23 0035 03/29/23 0140   03/29/23 0045  azithromycin (ZITHROMAX) 500 mg in sodium chloride 0.9 % 250 mL IVPB        500 mg 250 mL/hr over 60 Minutes Intravenous  Once 03/29/23 0035 03/29/23 0555          Medications  feeding supplement  1 Container Oral BID BM   mirtazapine  15 mg Oral QHS   pantoprazole (PROTONIX) IV  40 mg Intravenous Q12H      Subjective:   Connie Snyder was seen and examined today.  Still spiking fevers overnight.  + Chills.  Difficult to obtain ROS from the patient due to dementia.  No ongoing chest pain, acute shortness of breath nausea vomiting.   Objective:   Vitals:   03/30/23 0100 03/30/23 0222 03/30/23 0423 03/30/23 1256  BP:   106/65 108/64  Pulse:   81 74  Resp:   16   Temp: (!) 102.9 F (39.4 C) 99.8 F (37.7 C) 99.2 F (37.3 C) 98.9 F (37.2 C)  TempSrc: Oral Oral Oral Oral  SpO2:   98% 95%  Weight:      Height:        Intake/Output Summary (Last 24 hours) at 03/30/2023 1529 Last data filed at 03/30/2023 1511 Gross per 24 hour  Intake 710 ml  Output 100 ml  Net 610 ml     Wt Readings from Last 3 Encounters:  03/28/23 52.4 kg  04/25/22 50.6 kg  04/12/22 50.6 kg   Physical Exam General: Alert and awake, NAD Cardiovascular: S1 S2 clear, RRR.  Respiratory: Diminished BS, no wheezing Gastrointestinal: Soft, nontender, nondistended, NBS Ext: no pedal edema bilaterally Neuro: does not follow commands Psych: dementia    Data Reviewed:  I have personally reviewed following labs    CBC Lab Results  Component Value Date   WBC 8.3 03/30/2023   RBC 3.05 (L) 03/30/2023   HGB 7.8 (L) 03/30/2023   HCT 22.7 (L) 03/30/2023   MCV 74.4 (L) 03/30/2023   MCH 25.6 (L) 03/30/2023   PLT 146 (L) 03/30/2023   MCHC 34.4 03/30/2023   RDW 15.4 03/30/2023   LYMPHSABS 1.0 04/25/2022   MONOABS 0.7 04/25/2022   EOSABS 0.1 04/25/2022   BASOSABS 0.0 04/25/2022     Last metabolic panel Lab  Results  Component Value Date   NA 131 (L) 03/30/2023   K 3.1 (L) 03/30/2023   CL 103 03/30/2023   CO2 21 (L) 03/30/2023  BUN 20 03/30/2023   CREATININE 0.57 03/30/2023   GLUCOSE 100 (H) 03/30/2023   GFRNONAA >60 03/30/2023   GFRAA >60 11/09/2018   CALCIUM 8.0 (L) 03/30/2023   PROT 6.1 (L) 04/26/2021   ALBUMIN 3.1 (L) 04/26/2021   BILITOT 0.8 04/26/2021   ALKPHOS 66 04/26/2021   AST 24 04/26/2021   ALT 17 04/26/2021   ANIONGAP 7 03/30/2023    CBG (last 3)  Recent Labs    03/29/23 1720  GLUCAP 94      Coagulation Profile: No results for input(s): "INR", "PROTIME" in the last 168 hours.   Radiology Studies: I have personally reviewed the imaging studies  ECHOCARDIOGRAM COMPLETE Result Date: 03/29/2023    ECHOCARDIOGRAM REPORT   Patient Name:   BURNITA TRYBUS [facility] Date of Exam: 03/29/2023 Medical Rec #:  [id number]       Height:       64.0 in Accession #:    [id number]      Weight:       115.6 lb Date of Birth:  01-04-1928      BSA:          1.550 m Patient Age:    89 years        BP:           153/96 mmHg Patient Gender: F               HR:           78 bpm. Exam Location:  Inpatient Procedure: 2D Echo, Cardiac Doppler and Color Doppler Indications:     Chest Pain R07.9  History:         Patient has prior history of Echocardiogram examinations, most                  recent 04/19/2022. Risk Factors:Former Smoker. History of                  pulmonary embolism, Altered mental status/Confusion.  Sonographer:     Celesta Gentile RCS Referring Phys:  [id number] Alan Mulder Diagnosing Phys: Arvilla Meres MD  Sonographer Comments: Somewhat difficult exam due to patient confusion at times. IMPRESSIONS  1. Left ventricular ejection fraction, by estimation, is 65 to 70%. The left ventricle has normal function. The left ventricle has no regional wall motion abnormalities. There is mild concentric left ventricular hypertrophy. Left ventricular diastolic parameters are consistent with Grade I  diastolic dysfunction (impaired relaxation).  2. Right ventricular systolic function is normal. The right ventricular size is normal. There is severely elevated pulmonary artery systolic pressure. The estimated right ventricular systolic pressure is 70.7 mmHg.  3. Left atrial size was mild to moderately dilated.  4. Right atrial size was moderately dilated.  5. The mitral valve is degenerative. Mild mitral valve regurgitation. No evidence of mitral stenosis.  6. The aortic valve is tricuspid. There is mild calcification of the aortic valve. Aortic valve regurgitation is trivial. Mild aortic valve stenosis. Aortic valve area, by VTI measures 1.23 cm. Aortic valve mean gradient measures 14.0 mmHg. Aortic valve Vmax measures 2.51 m/s.  7. The inferior vena cava is dilated in size with <50% respiratory variability, suggesting right atrial pressure of 15 mmHg. FINDINGS  Left Ventricle: Left ventricular ejection fraction, by estimation, is 65 to 70%. The left ventricle has normal function. The left ventricle has no regional wall motion abnormalities. The left ventricular internal cavity size was normal in size. There is  mild concentric left ventricular hypertrophy. Left  ventricular diastolic parameters are consistent with Grade I diastolic dysfunction (impaired relaxation). Right Ventricle: The right ventricular size is normal. No increase in right ventricular wall thickness. Right ventricular systolic function is normal. There is severely elevated pulmonary artery systolic pressure. The tricuspid regurgitant velocity is 3.73 m/s, and with an assumed right atrial pressure of 15 mmHg, the estimated right ventricular systolic pressure is 70.7 mmHg. Left Atrium: Left atrial size was mild to moderately dilated. Right Atrium: Right atrial size was moderately dilated. Pericardium: There is no evidence of pericardial effusion. Mitral Valve: The mitral valve is degenerative in appearance. There is mild calcification of the  mitral valve leaflet(s). Mild mitral annular calcification. Mild mitral valve regurgitation. No evidence of mitral valve stenosis. Tricuspid Valve: The tricuspid valve is normal in structure. Tricuspid valve regurgitation is mild . No evidence of tricuspid stenosis. Aortic Valve: The aortic valve is tricuspid. There is mild calcification of the aortic valve. Aortic valve regurgitation is trivial. Mild aortic stenosis is present. Aortic valve mean gradient measures 14.0 mmHg. Aortic valve peak gradient measures 25.2 mmHg. Aortic valve area, by VTI measures 1.23 cm. Pulmonic Valve: The pulmonic valve was not well visualized. Pulmonic valve regurgitation is not visualized. No evidence of pulmonic stenosis. Aorta: The aortic root is normal in size and structure. Venous: The inferior vena cava is dilated in size with less than 50% respiratory variability, suggesting right atrial pressure of 15 mmHg. IAS/Shunts: No atrial level shunt detected by color flow Doppler.  LEFT VENTRICLE PLAX 2D LVIDd:         3.40 cm   Diastology LVIDs:         2.40 cm   LV e' medial:    4.57 cm/s LV PW:         1.00 cm   LV E/e' medial:  21.3 LV IVS:        1.10 cm   LV e' lateral:   5.44 cm/s LVOT diam:     1.80 cm   LV E/e' lateral: 17.9 LV SV:         60 LV SV Index:   38 LVOT Area:     2.54 cm  RIGHT VENTRICLE RV S prime:     13.70 cm/s TAPSE (M-mode): 2.4 cm LEFT ATRIUM             Index        RIGHT ATRIUM           Index LA diam:        3.10 cm 2.00 cm/m   RA Area:     19.90 cm LA Vol (A2C):   44.7 ml 28.84 ml/m  RA Volume:   55.00 ml  35.49 ml/m LA Vol (A4C):   53.9 ml 34.78 ml/m LA Biplane Vol: 48.7 ml 31.42 ml/m  AORTIC VALVE AV Area (Vmax):    1.50 cm AV Area (Vmean):   1.26 cm AV Area (VTI):     1.23 cm AV Vmax:           251.00 cm/s AV Vmean:          171.000 cm/s AV VTI:            0.485 m AV Peak Grad:      25.2 mmHg AV Mean Grad:      14.0 mmHg LVOT Vmax:         148.00 cm/s LVOT Vmean:        84.600 cm/s LVOT VTI:  0.234 m LVOT/AV VTI ratio: 0.48  AORTA Ao Root diam: 3.20 cm MITRAL VALVE                TRICUSPID VALVE MV Area (PHT): 2.01 cm     TR Peak grad:   55.7 mmHg MV Decel Time: 377 msec     TR Vmax:        373.00 cm/s MV E velocity: 97.40 cm/s MV A velocity: 141.00 cm/s  SHUNTS MV E/A ratio:  0.69         Systemic VTI:  0.23 m                             Systemic Diam: 1.80 cm Arvilla Meres MD Electronically signed by Arvilla Meres MD Signature Date/Time: 03/29/2023/1:30:30 PM    Final (Updated)    DG Shoulder 1V Left Result Date: 03/29/2023 CLINICAL DATA:  [id number] with bilateral shoulder pain, question recent injury. Patient has dementia and cannot provide reliable history. EXAM: LEFT SHOULDER; RIGHT SHOULDER - 1 VIEW COMPARISON:  No prior right shoulder series. Comparison made with the left shoulder series dated 08/15/2019. FINDINGS: Right shoulder, single AP internal rotation view: There are short linear calcifications alongside the greater tuberosity which could be due to rotator cuff calcific tendinopathy or tiny chip fractures. There is osteopenia without further single-view evidence of fractures. No dislocation is seen.  There are mild spurs at the [facility] joint. There are healed fracture deformities of several right-sided ribs. No infiltrate in the visualized right lung. Perihilar linear atelectasis or scarring. Left shoulder, single AP view: Again noted is a chronic proximal left humeral shaft fracture with an atrophic nonunion and scattered bony debris between the main fracture fragments. The distal fragment again is displaced medially by greater than 1 bone width and laterally angulated, on the prior study was also anteriorly displaced and mildly anteriorly angulated. Osteopenia is present without evidence of new fracture on this single view. There is slight spurring of the [facility] joint. No infiltrate in the peripheral left lung. IMPRESSION: 1. Short linear calcifications alongside the right greater  tuberosity which could be due to rotator cuff calcific tendinopathy or tiny chip fractures. 2. Osteopenia without further single-view evidence of fractures. 3. 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 The distal fragment again is displaced medially by greater than 1 bone width and laterally angulated, on the prior study was also anteriorly displaced and mildly anteriorly angulated. 4. Mild degenerative changes of the AC joints bilaterally. Electronically Signed   By: Almira Bar M.D.   On: 03/29/2023 06:05   DG Shoulder 1V Right Result Date: 03/29/2023 CLINICAL DATA:  [id number] with bilateral shoulder pain, question recent injury. Patient has dementia and cannot provide reliable history. EXAM: LEFT SHOULDER; RIGHT SHOULDER - 1 VIEW COMPARISON:  No prior right shoulder series. Comparison made with the left shoulder series dated 08/15/2019. FINDINGS: Right shoulder, single AP internal rotation view: There are short linear calcifications alongside the greater tuberosity which could be due to rotator cuff calcific tendinopathy or tiny chip fractures. There is osteopenia without further single-view evidence of fractures. No dislocation is seen.  There are mild spurs at the [facility] joint. There are healed fracture deformities of several right-sided ribs. No infiltrate in the visualized right lung. Perihilar linear atelectasis or scarring. Left shoulder, single AP view: Again noted is a chronic proximal left humeral shaft fracture with  an atrophic nonunion and scattered bony debris between the main fracture fragments. The distal fragment again is displaced medially by greater than 1 bone width and laterally angulated, on the prior study was also anteriorly displaced and mildly anteriorly angulated. Osteopenia is present without evidence of new fracture on this single view. There is slight spurring of the [facility] joint. No infiltrate in the  peripheral left lung. IMPRESSION: 1. Short linear calcifications alongside the right greater tuberosity which could be due to rotator cuff calcific tendinopathy or tiny chip fractures. 2. Osteopenia without further single-view evidence of fractures. 3. 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 The distal fragment again is displaced medially by greater than 1 bone width and laterally angulated, on the prior study was also anteriorly displaced and mildly anteriorly angulated. 4. Mild degenerative changes of the AC joints bilaterally. Electronically Signed   By: Almira Bar M.D.   On: 03/29/2023 06:05   DG Chest Portable 1 View Result Date: 03/28/2023 CLINICAL DATA:  Left arm pain EXAM: PORTABLE CHEST 1 VIEW COMPARISON:  04/25/2022 FINDINGS: Heart and mediastinal contours within normal limits. Left lung clear. Airspace opacity throughout the right upper lobe. No effusions. No acute bony abnormality IMPRESSION: Airspace opacity throughout the right upper lobe concerning for pneumonia. Electronically Signed   By: Charlett Nose M.D.   On: 03/28/2023 22:38       Alieyah Spader M.D. [facility] 03/30/2023, 3:29 PM  Available via Epic secure chat 7am-7pm After 7 pm, please refer to night coverage provider listed on amion.

## 2023-03-30 NOTE — Evaluation (Signed)
Occupational Therapy Evaluation Patient Details Name: Connie Snyder MRN: [id number] DOB: January 08, 1928 Today's Date: 03/30/2023   History of Present Illness Connie Snyder is a 89 yr old female admitted to the hospital with L UE pain. She was found to have PNA and acute on chronic anemia due to likely due to upper GI bleed. PMH: arthritis, PE, Sjorens, dementia, L THA   Clinical Impression   The pt is currently presenting with the below listed deficits, which compromise her ADL performance and functional independence (see OT problem list). During the session today, she required min assist for supine to sit, min assist to stand using a RW, mod assist for toileting at bedside commode level, and set-up assist for feeding. She was noted to be oriented to person only and with memory impairment, which may be typical of her baseline, as she has a history of dementia. She was also noted to be with chronic appearing L shoulder AROM limitations, slight generalized weakness, and mild deconditioning. She will benefit from further OT services to maximize her safety and independence with self-care tasks and to decrease the risk for further weakness and deconditioning. If her family is able to assist her at current functional level, then she can return home with home health therapy upon discharge.       If plan is discharge home, recommend the following: Help with stairs or ramp for entrance;Assist for transportation;Assistance with cooking/housework;Direct supervision/assist for financial management;Direct supervision/assist for medications management;Supervision due to cognitive status;A little help with walking and/or transfers;A lot of help with bathing/dressing/bathroom    Functional Status Assessment  Patient has had a recent decline in their functional status and demonstrates the ability to make significant improvements in function in a reasonable and predictable amount of time.  Equipment Recommendations   None recommended by OT    Recommendations for Other Services       Precautions / Restrictions Precautions Precautions: Fall Restrictions Weight Bearing Restrictions Per Provider Order: No      Mobility Bed Mobility Overal bed mobility: Needs Assistance Bed Mobility: Supine to Sit     Supine to sit: HOB elevated, Used rails, Min assist          Transfers Overall transfer level: Needs assistance Equipment used: Rolling walker (2 wheels) Transfers: Sit to/from Stand, Bed to chair/wheelchair/BSC Sit to Stand: From elevated surface, Min assist     Step pivot transfers: Min assist            Balance       Sitting balance - Comments: static sitting-good. dynamic sitting-fair+       Standing balance comment: CGA to min assist with RW         ADL either performed or assessed with clinical judgement   ADL Overall ADL's : Needs assistance/impaired Eating/Feeding: Set up;Sitting Eating/Feeding Details (indicate cue type and reason): Pt initiated self feeding seated in the chair, after lids/tops were removed, packets were opened and min verbal cues to initiate task was provided Grooming: Minimal assistance;Sitting Grooming Details (indicate cue type and reason): at chair level, based on clinical judgement             Lower Body Dressing: Maximal assistance   Toilet Transfer: Minimal assistance;BSC/3in1;Rolling walker (2 wheels);Ambulation Toilet Transfer Details (indicate cue type and reason): The pt was assisted onto and off bedside commode for toileting tasks, using a RW for support in standing. She required cues for RW positioning and to reach for and use hand rail for transfers onto  and off commode Toileting- Clothing Manipulation and Hygiene: Moderate assistance;Sit to/from stand;Cueing for safety Toileting - Clothing Manipulation Details (indicate cue type and reason): The pt urinated at bedside commode level. She required assist for clothing management,  steadying assist in standing, and assist for hygiene in standing. Verbal cues required for her to demo upright posture in standing.              Pertinent Vitals/Pain Pain Assessment Pain Assessment: Faces Pain Score: 3  Pain Location: The pt coould not quantify her pain, however was noted to report having pain of her R UE between her elbow and wrist (? possibly due to IV site). Pain Intervention(s): Monitored during session     Extremity/Trunk Assessment Upper Extremity Assessment Upper Extremity Assessment: Right hand dominant;LUE deficits/detail LUE Deficits / Details: Chronic appearing shoulder AROM limitations noted wtith AROM for shoulder flexion being <90 degrees. Pt reported a history of arthritis. Functional grip strength of B UE   Lower Extremity Assessment Lower Extremity Assessment: LLE deficits/detail LLE Deficits / Details: Chronic appearing arthritic changes of knee       Communication Communication Communication: No apparent difficulties   Cognition Arousal: Alert Behavior During Therapy: WFL for tasks assessed/performed Overall Cognitive Status: History of cognitive impairments - at baseline Area of Impairment: Orientation, Memory        Orientation Level: Disoriented to, Place, Time, Situation             General Comments: Oriented to person only                Home Living Family/patient expects to be discharged to:: Private residence Living Arrangements: Children          Additional Comments: The pt was unable to provide reliable information regarding her prior level of functioning and living situation, given her baseline dementia. She initially reported living her with mother, father, and brother. Per old therapy notes, she lives with her child in a 2 story home with a tub/shower combo. Old therapy notes also indicate she has a RW and cane.              OT Problem List: Decreased strength;Decreased range of motion;Impaired balance  (sitting and/or standing);Decreased cognition;Decreased knowledge of use of DME or AE;Decreased safety awareness      OT Treatment/Interventions: Self-care/ADL training;Therapeutic exercise;Energy conservation;DME and/or AE instruction;Balance training;Patient/family education;Cognitive remediation/compensation;Therapeutic activities    OT Goals(Current goals can be found in the care plan section) Acute Rehab OT Goals OT Goal Formulation: Patient unable to participate in goal setting Time For Goal Achievement: 04/13/23 Potential to Achieve Goals: Good  OT Frequency: Min 1X/week    Co-evaluation PT/OT/SLP Co-Evaluation/Treatment: Yes Reason for Co-Treatment: To address functional/ADL transfers PT goals addressed during session: Mobility/safety with mobility OT goals addressed during session: ADL's and self-care      AM-PAC OT "6 Clicks" Daily Activity     Outcome Measure Help from another person eating meals?: A Little Help from another person taking care of personal grooming?: A Little Help from another person toileting, which includes using toliet, bedpan, or urinal?: A Little Help from another person bathing (including washing, rinsing, drying)?: A Lot Help from another person to put on and taking off regular upper body clothing?: A Little Help from another person to put on and taking off regular lower body clothing?: A Lot 6 Click Score: 16   End of Session Equipment Utilized During Treatment: Gait belt;Rolling walker (2 wheels) Nurse Communication: Mobility status  Activity Tolerance:  Patient tolerated treatment well Patient left: in chair;with call bell/phone within reach;with chair alarm set  OT Visit Diagnosis: Muscle weakness (generalized) (M62.81);Unsteadiness on feet (R26.81);Other symptoms and signs involving cognitive function                Time: 1022-1100 OT Time Calculation (min): 38 min Charges:  OT General Charges $OT Visit: 1 Visit OT Evaluation $OT Eval  Moderate Complexity: 1 Mod OT Treatments $Self Care/Home Management : 8-22 mins    Reuben Likes, OTR/L 03/30/2023, 12:31 PM

## 2023-03-30 NOTE — TOC Initial Note (Signed)
Transition of Care [facility]) - Initial/Assessment Note    Patient Details  Name: Connie Snyder MRN: [id number] Date of Birth: 05-21-1927  Transition of Care [facility]) CM/SW Contact:    Lanier Clam, RN Phone Number: 03/30/2023, 3:58 PM  Clinical Narrative:  Iantha Fallen for HHPT/OT-Marcus(/so) no preference.                 Expected Discharge Plan: Home w Home Health Services Barriers to Discharge: Continued Medical Work up   Patient Goals and CMS Choice            Expected Discharge Plan and Services                                              Prior Living Arrangements/Services                       Activities of Daily Living   ADL Screening (condition at time of admission) Independently performs ADLs?: No Does the patient have a NEW difficulty with bathing/dressing/toileting/self-feeding that is expected to last >3 days?: No Does the patient have a NEW difficulty with getting in/out of bed, walking, or climbing stairs that is expected to last >3 days?: No Does the patient have a NEW difficulty with communication that is expected to last >3 days?: No Is the patient deaf or have difficulty hearing?: Yes Does the patient have difficulty seeing, even when wearing glasses/contacts?: No Does the patient have difficulty concentrating, remembering, or making decisions?: Yes  Permission Sought/Granted                  Emotional Assessment              Admission diagnosis:  Community acquired pneumonia of right upper lobe of lung [J18.9] Patient Active Problem List   Diagnosis Date Noted   Community acquired pneumonia of right upper lobe of lung 03/29/2023   Chest pain 06/03/2021   Syncope, vasovagal 06/03/2021   Aspiration pneumonia (HCC) 06/03/2021   History of pulmonary embolism 06/03/2021   Pulmonary emboli (HCC) 04/27/2021   Thigh hematoma 04/26/2021   Acute blood loss anemia 04/26/2021   Rheumatoid arthritis (HCC) 04/26/2021   Altered  mental status 11/08/2018   Confusion    Vascular dementia without behavioral disturbance (HCC)    Normocytic normochromic anemia 12/22/2017   S/p left hip fracture 12/22/2017   Malnutrition of moderate degree 12/22/2017   Closed fracture of left hip (HCC) 12/21/2017   Closed fracture of left proximal humerus 11/30/2017   PCP:  Georgianne Fick, MD Pharmacy:   [facility] [phone] - [place], [place] - [address] AT CORNER OF [facility] [address] [place] [address] Phone: [phone] Fax: [phone]     Social Drivers of Health (SDOH) Social History: SDOH Screenings   Food Insecurity: Patient Unable To Answer (03/30/2023)  Housing: Patient Unable To Answer (03/30/2023)  Transportation Needs: Patient Unable To Answer (03/30/2023)  Utilities: Patient Unable To Answer (03/30/2023)  Tobacco Use: Medium Risk (03/28/2023)   SDOH Interventions:     Readmission Risk Interventions     No data to display

## 2023-03-30 NOTE — Evaluation (Signed)
Physical Therapy Evaluation Patient Details Name: Connie Snyder MRN: [id number] DOB: 08-28-1927 Today's Date: 03/30/2023  History of Present Illness  Connie Snyder is a 89 yr old female admitted to the hospital with L UE pain. She was found to have PNA and acute on chronic anemia due to likely due to upper GI bleed. PMH: arthritis, PE, Sjorens, dementia, L THA  Clinical Impression  Pt admitted as above and presenting with functional mobility limitations 2* generalized weakness, decreased endurance, balance deficits and decreased safety awareness. During the session today, she required min assist for supine to sit, min assist to stand using a RW, mod assist for toileting at bedside commode level, and set-up assist for feeding. She was noted to be oriented to person only and with memory impairment, which may be typical of her baseline, as she has a history of dementia. She was also noted to be with chronic appearing L shoulder AROM limitations, slight generalized weakness, and mild deconditioning. She will benefit from further PT services to maximize her safety and independence and decrease the risk for further weakness and deconditioning. If her family is able to assist her at current functional level, then she can return home with home health therapy upon discharge.         If plan is discharge home, recommend the following: A little help with walking and/or transfers;A little help with bathing/dressing/bathroom;Assistance with cooking/housework;Assist for transportation;Help with stairs or ramp for entrance   Can travel by private vehicle        Equipment Recommendations None recommended by PT  Recommendations for Other Services       Functional Status Assessment Patient has had a recent decline in their functional status and demonstrates the ability to make significant improvements in function in a reasonable and predictable amount of time.     Precautions / Restrictions  Precautions Precautions: Fall Restrictions Weight Bearing Restrictions Per Provider Order: No      Mobility  Bed Mobility Overal bed mobility: Needs Assistance Bed Mobility: Supine to Sit     Supine to sit: HOB elevated, Used rails, Min assist     General bed mobility comments: Use of bed rail    Transfers Overall transfer level: Needs assistance Equipment used: Rolling walker (2 wheels) Transfers: Sit to/from Stand, Bed to chair/wheelchair/BSC Sit to Stand: From elevated surface, Min assist   Step pivot transfers: Min assist       General transfer comment: cues for use of UEs to self assist;  Physical assist to bring wt up and fwd    Ambulation/Gait Ambulation/Gait assistance: Min assist Gait Distance (Feet): 58 Feet Assistive device: Rolling walker (2 wheels) Gait Pattern/deviations: Step-to pattern, Step-through pattern, Decreased step length - right, Decreased step length - left, Shuffle, Trunk flexed Gait velocity: decr     General Gait Details: cues for posture, position from RW and safety awareness  Stairs            Wheelchair Mobility     Tilt Bed    Modified Rankin (Stroke Patients Only)       Balance Overall balance assessment: Needs assistance Sitting-balance support: No upper extremity supported, Feet supported Sitting balance-Leahy Scale: Good     Standing balance support: Bilateral upper extremity supported Standing balance-Leahy Scale: Poor Standing balance comment: CGA to min assist with RW                             Pertinent  Vitals/Pain Pain Assessment Pain Assessment: Faces Pain Score: 3  Pain Location: The pt coould not quantify her pain, however was noted to report having pain of her R UE between her elbow and wrist (? possibly due to IV site). Pain Intervention(s): Monitored during session    Home Living Family/patient expects to be discharged to:: Private residence Living Arrangements:  Children Available Help at Discharge: Family Type of Home: House Home Access: Stairs to enter Entrance Stairs-Rails: Doctor, general practice of Steps: 2   Home Layout: One level Home Equipment: Agricultural consultant (2 wheels);Cane - single point Additional Comments: The pt was unable to provide reliable information regarding her prior level of functioning and living situation, given her baseline dementia. She initially reported living her with mother, father, and brother. Per old therapy notes, she lives with her child in a 2 story home with a tub/shower combo. Old therapy notes also indicate she has a RW and cane.    Prior Function Prior Level of Function : Needs assist  Cognitive Assist : Mobility (cognitive)           Mobility Comments: Pt reports being ambulatory but unreliable 2* dementia       Extremity/Trunk Assessment   Upper Extremity Assessment Upper Extremity Assessment: Right hand dominant LUE Deficits / Details: Chronic appearing shoulder AROM limitations noted wtith AROM for shoulder flexion being <90 degrees. Pt reported a history of arthritis. Functional grip strength of B UE    Lower Extremity Assessment Lower Extremity Assessment: Generalized weakness;RLE deficits/detail LLE Deficits / Details: Chronic appearing arthritic changes of knee       Communication   Communication Communication: No apparent difficulties  Cognition Arousal: Alert Behavior During Therapy: WFL for tasks assessed/performed Overall Cognitive Status: History of cognitive impairments - at baseline Area of Impairment: Orientation, Memory                 Orientation Level: Disoriented to, Place, Time, Situation             General Comments: Oriented to person only        General Comments      Exercises     Assessment/Plan    PT Assessment Patient needs continued PT services  PT Problem List Decreased strength;Decreased activity tolerance;Decreased range of  motion;Decreased balance;Decreased mobility;Decreased cognition;Decreased knowledge of use of DME;Pain;Decreased safety awareness       PT Treatment Interventions DME instruction;Gait training;Stair training;Functional mobility training;Therapeutic activities;Therapeutic exercise;Patient/family education    PT Goals (Current goals can be found in the Care Plan section)  Acute Rehab PT Goals Patient Stated Goal: Go back to sleep PT Goal Formulation: With patient Time For Goal Achievement: 03/30/23 Potential to Achieve Goals: Fair    Frequency Min 1X/week     Co-evaluation PT/OT/SLP Co-Evaluation/Treatment: Yes Reason for Co-Treatment: To address functional/ADL transfers PT goals addressed during session: Mobility/safety with mobility OT goals addressed during session: ADL's and self-care       AM-PAC PT "6 Clicks" Mobility  Outcome Measure Help needed turning from your back to your side while in a flat bed without using bedrails?: A Little Help needed moving from lying on your back to sitting on the side of a flat bed without using bedrails?: A Little Help needed moving to and from a bed to a chair (including a wheelchair)?: A Little Help needed standing up from a chair using your arms (e.g., wheelchair or bedside chair)?: A Little Help needed to walk in hospital room?: A Little Help needed  climbing 3-5 steps with a railing? : A Lot 6 Click Score: 17    End of Session Equipment Utilized During Treatment: Gait belt Activity Tolerance: Patient tolerated treatment well Patient left: in chair;with call bell/phone within reach;with chair alarm set Nurse Communication: Mobility status PT Visit Diagnosis: Unsteadiness on feet (R26.81);Muscle weakness (generalized) (M62.81)    Time: 1022-1100 PT Time Calculation (min) (ACUTE ONLY): 38 min   Charges:   PT Evaluation $PT Eval Moderate Complexity: 1 Mod PT Treatments $Gait Training: 8-22 mins PT General Charges $$ ACUTE PT  VISIT: 1 Visit         Mauro Kaufmann PT Acute Rehabilitation Services Pager [phone] Office [phone]   Sema Stangler 03/30/2023, 1:00 PM

## 2023-03-31 DIAGNOSIS — J189 Pneumonia, unspecified organism: Secondary | ICD-10-CM | POA: Diagnosis not present

## 2023-03-31 LAB — CBC
HCT: 23.4 % — ABNORMAL LOW (ref 36.0–46.0)
Hemoglobin: 7.8 g/dL — ABNORMAL LOW (ref 12.0–15.0)
MCH: 24.9 pg — ABNORMAL LOW (ref 26.0–34.0)
MCHC: 33.3 g/dL (ref 30.0–36.0)
MCV: 74.8 fL — ABNORMAL LOW (ref 80.0–100.0)
Platelets: 277 10*3/uL (ref 150–400)
RBC: 3.13 MIL/uL — ABNORMAL LOW (ref 3.87–5.11)
RDW: 15.9 % — ABNORMAL HIGH (ref 11.5–15.5)
WBC: 7.3 10*3/uL (ref 4.0–10.5)
nRBC: 0 % (ref 0.0–0.2)

## 2023-03-31 LAB — RETICULOCYTES
Immature Retic Fract: 18.5 % — ABNORMAL HIGH (ref 2.3–15.9)
RBC.: 3.08 MIL/uL — ABNORMAL LOW (ref 3.87–5.11)
Retic Count, Absolute: 38.8 10*3/uL (ref 19.0–186.0)
Retic Ct Pct: 1.3 % (ref 0.4–3.1)

## 2023-03-31 LAB — BASIC METABOLIC PANEL
Anion gap: 7 (ref 5–15)
BUN: 19 mg/dL (ref 8–23)
CO2: 22 mmol/L (ref 22–32)
Calcium: 8.1 mg/dL — ABNORMAL LOW (ref 8.9–10.3)
Chloride: 103 mmol/L (ref 98–111)
Creatinine, Ser: 0.72 mg/dL (ref 0.44–1.00)
GFR, Estimated: >60 mL/min (ref >60)
Glucose, Bld: 103 mg/dL — ABNORMAL HIGH (ref 70–99)
Potassium: 3.6 mmol/L (ref 3.5–5.1)
Sodium: 132 mmol/L — ABNORMAL LOW (ref 135–145)

## 2023-03-31 LAB — FOLATE: Folate: 9.9 ng/mL (ref >5.9)

## 2023-03-31 LAB — IRON AND TIBC
Iron: 18 ug/dL — ABNORMAL LOW (ref 28–170)
Saturation Ratios: 6 % — ABNORMAL LOW (ref 10.4–31.8)
TIBC: 325 ug/dL (ref 250–450)
UIBC: 307 ug/dL

## 2023-03-31 LAB — FERRITIN: Ferritin: 39 ng/mL (ref 11–307)

## 2023-03-31 LAB — LEGIONELLA PNEUMOPHILA SEROGP 1 UR AG: L. pneumophila Serogp 1 Ur Ag: NEGATIVE

## 2023-03-31 LAB — VITAMIN B12: Vitamin B-12: 215 pg/mL (ref 180–914)

## 2023-03-31 MED ORDER — AZITHROMYCIN 500 MG PO TABS
500.0000 mg | ORAL_TABLET | Freq: Every day | ORAL | Status: AC
  Administered 2023-03-31 – 2023-04-02 (×3): 500 mg via ORAL
  Filled 2023-03-31 (×3): qty 1

## 2023-03-31 MED ORDER — SODIUM CHLORIDE 0.9 % IV SOLN
2.0000 g | INTRAVENOUS | Status: AC
  Administered 2023-03-31 – 2023-04-02 (×3): 2 g via INTRAVENOUS
  Filled 2023-03-31 (×3): qty 20

## 2023-03-31 MED ORDER — CYANOCOBALAMIN 1000 MCG/ML IJ SOLN
1000.0000 ug | Freq: Every day | INTRAMUSCULAR | Status: DC
  Administered 2023-03-31 – 2023-04-02 (×2): 1000 ug via INTRAMUSCULAR
  Filled 2023-03-31 (×5): qty 1

## 2023-03-31 NOTE — Progress Notes (Signed)
SLP Cancellation Note  Patient Details Name: Connie Snyder MRN: [id number] DOB: 27-Jan-1928   Cancelled treatment:       Reason Eval/Treat Not Completed: Other (comment) (pt refused to have po, SLP slid her up in the chair)  Pt is disoriented stating I was violating her privacy by following her around town.  Voice was clear.   Rolena Infante, MS [facility] SLP Acute Rehab Services Office [phone]   Chales Abrahams 03/31/2023, 3:03 PM

## 2023-03-31 NOTE — Progress Notes (Addendum)
PROGRESS NOTE    Connie Snyder  HYQ:[id number] DOB: [birth year] DOA: 03/28/2023 PCP: Georgianne Fick, MD   Brief Narrative: 89 year old with past medical history significant for arthritis, pulmonary embolism on Xarelto,  Sjogren syndrome, Dementia,  presented with confusion and left arm pain.  She was also having dark tarry stool.  Patient was found to have pneumonia, chest x-ray showed right upper lobe airspace opacity, fever.   Assessment & Plan:   Principal Problem:   Community acquired pneumonia of right upper lobe of lung Active Problems:   Normocytic normochromic anemia   Malnutrition of moderate degree   Vascular dementia without behavioral disturbance (HCC)   Acute blood loss anemia   History of pulmonary embolism   1-Community-acquired pneumonia right upper lobe of the lung -Patient presented with shortness of breath, fever chest x-ray right upper lobe airspace opacity. -Continue with IV ceftriaxone and azithromycin -Operatory panel positive for rhinovirus.  Supportive care  Elevation troponin -Likely secondary to demand ischemia in the setting of pneumonia -Troponin flat 12-38. -Echo: Ejection fraction 65 to 70%.  No regional wall motion abnormality.  Diastolic grade 1 dysfunction.  Right ventricular systolic function is normal.  Severely elevated pulmonary arterial pressure   Acute on chronic normocytic anemia, ABLA likely secondary to upper GI bleed: -Baseline hemoglobin 10-12 presented with a hemoglobin of 8.- -GI consulted and recommended supportive care.  IV PPI -Plan is to resume anticoagulation in 48 hours and if no further  bleeding B12 deficiency; start B12 supplement.   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   Weight as of this encounter: 52.4 kg.   DVT prophylaxis: SCD, no anticoagulation due to concern for GI bleed.  Code Status: Full code Family Communication: Disposition Plan:  Status is: Inpatient Remains inpatient appropriate because: Management of pneumonia    Consultants:  none  Procedures:  none  Antimicrobials:    Subjective: She is alert, denies pain.  Breathing better.  Very hard of hearing.   Objective: Vitals:   03/30/23 2049 03/31/23 0300 03/31/23 0521 03/31/23 0521  BP: 119/75  109/77 109/77  Pulse: 88  78 78  Resp: 20  18 18   Temp: 99.6 F (37.6 C) 100.3 F (37.9 C) 99.1 F (37.3 C) 99.1 F (37.3 C)  TempSrc: Oral Oral Oral Oral  SpO2: 100%  98% 98%  Weight:      Height:        Intake/Output Summary (Last 24 hours) at 03/31/2023 0733 Last data filed at 03/31/2023 0600 Gross per 24 hour  Intake 690 ml  Output 525 ml  Net 165 ml   Filed Weights   03/28/23 2132  Weight: 52.4 kg    Examination:  General exam: Appears calm and comfortable  Respiratory system: Clear to auscultation. Respiratory effort normal. Cardiovascular system: S1 & S2 heard, RRR.  Gastrointestinal system: Abdomen is nondistended, soft and nontender. No organomegaly or masses felt. Normal bowel sounds heard. Central nervous system: Alert and oriented. No focal neurological deficits. Extremities: Symmetric 5 x 5 power.    Data Reviewed: I  have personally reviewed following labs and imaging studies  CBC: Recent Labs  Lab 03/29/23 0503 03/29/23 1318 03/29/23 2107 03/30/23 0501 03/31/23 0443  WBC 12.2* 10.4 10.9* 8.3 7.3  HGB 8.3* 7.8* 8.2* 7.8* 7.8*  HCT 24.7* 22.1* 24.2* 22.7* 23.4*  MCV 75.1* 74.2* 74.0* 74.4*  74.8*  PLT 309 304 255 146* 277   Basic Metabolic Panel: Recent Labs  Lab 03/28/23 2152 03/30/23 0501 03/31/23 0443  NA 136 131* 132*  K 3.7 3.1* 3.6  CL 103 103 103  CO2 23 21* 22  GLUCOSE 105* 100* 103*  BUN 22 20 19   CREATININE 0.63 0.57 0.72  CALCIUM 8.9 8.0* 8.1*   GFR: Estimated Creatinine Clearance: 36.3 mL/min (by C-G formula based on SCr of 0.72 mg/dL). Liver Function Tests: No results for input(s): "AST", "ALT", "ALKPHOS", "BILITOT", "PROT", "ALBUMIN" in the last 168 hours. No results for input(s): "LIPASE", "AMYLASE" in the last 168 hours. No results for input(s): "AMMONIA" in the last 168 hours. Coagulation Profile: No results for input(s): "INR", "PROTIME" in the last 168 hours. Cardiac Enzymes: No results for input(s): "CKTOTAL", "CKMB", "CKMBINDEX", "TROPONINI" in the last 168 hours. BNP (last 3 results) No results for input(s): "PROBNP" in the last 8760 hours. HbA1C: No results for input(s): "HGBA1C" in the last 72 hours. CBG: Recent Labs  Lab 03/29/23 1720  GLUCAP 94   Lipid Profile: No results for input(s): "CHOL", "HDL", "LDLCALC", "TRIG", "CHOLHDL", "LDLDIRECT" in the last 72 hours. Thyroid Function Tests: No results for input(s): "TSH", "T4TOTAL", "FREET4", "T3FREE", "THYROIDAB" in the last 72 hours. Anemia Panel: No results for input(s): "VITAMINB12", "FOLATE", "FERRITIN", "TIBC", "IRON", "RETICCTPCT" in the last 72 hours. Sepsis Labs: Recent Labs  Lab 03/29/23 1318  PROCALCITON 0.53    Recent Results (from the past 240 hours)  Culture, blood (Routine X 2) w Reflex to ID Panel     Status: None (Preliminary result)   Collection Time: 03/29/23  1:18 PM   Specimen: BLOOD LEFT ARM  Result Value Ref Range Status   Specimen Description   Final    BLOOD LEFT ARM Performed at [facility], [address], [place], [place] [address]    Special Requests   Final    BOTTLES DRAWN AEROBIC ONLY Blood Culture adequate volume Performed at  [facility], [address], [place], [place] [address]    Culture   Final    NO GROWTH 2 DAYS Performed at [facility], [address] [place], [place] [address]    Report Status PENDING  Incomplete  Culture, blood (Routine X 2) w Reflex to ID Panel     Status: None (Preliminary result)   Collection Time: 03/29/23  1:23 PM   Specimen: BLOOD LEFT HAND  Result Value Ref Range Status   Specimen Description   Final    BLOOD LEFT HAND Performed at [facility], [address], [place], [place] [address]    Special Requests   Final    BOTTLES DRAWN AEROBIC ONLY Blood Culture results may not be optimal due to an inadequate volume of blood received in culture bottles Performed at [facility], [address] [place], [place] [address]    Culture   Final    NO GROWTH 2 DAYS Performed at [facility], [address], [place], [place] [address]    Report Status PENDING  Incomplete  SARS Coronavirus 2 by RT PCR (hospital order, performed in [facility]  hospital lab) *cepheid single result test* Anterior Nasal Swab     Status: None   Collection Time: 03/30/23  4:00 PM   Specimen: Anterior Nasal Swab  Result Value Ref Range Status   SARS Coronavirus 2 by RT PCR NEGATIVE NEGATIVE Final    Comment: (NOTE) SARS-CoV-2 target nucleic acids are NOT DETECTED.  The SARS-CoV-2 RNA is generally detectable in upper and lower respiratory specimens during the acute phase of infection. The lowest concentration of SARS-CoV-2 viral copies this assay can detect is 250 copies / mL. A negative result does not preclude SARS-CoV-2 infection and should not be used as the sole basis for treatment or other patient management decisions.  A negative result may occur with improper specimen collection / handling, submission of specimen other than nasopharyngeal swab, presence of viral mutation(s) within the areas targeted by this assay, and inadequate number  of viral copies (<250 copies / mL). A negative result must be combined with clinical observations, patient history, and epidemiological information.  Fact Sheet for Patients:   RoadLapTop.co.za  Fact Sheet for Healthcare Providers: http://kim-miller.com/  This test is not yet approved or  cleared by the [place] FDA and has been authorized for detection and/or diagnosis of SARS-CoV-2 by FDA under an Emergency Use Authorization (EUA).  This EUA will remain in effect (meaning this test can be used) for the duration of the COVID-19 declaration under Section 564(b)(1) of the Act, 21 U.S.C. section 360bbb-3(b)(1), unless the authorization is terminated or revoked sooner.  Performed at [facility], [address], [place], [place] [address]   Respiratory (~20 pathogens) panel by PCR     Status: Abnormal   Collection Time: 03/30/23  4:00 PM   Specimen: Nasopharyngeal Swab; Respiratory  Result Value Ref Range Status   Adenovirus NOT DETECTED NOT DETECTED Final   Coronavirus 229E NOT DETECTED NOT DETECTED Final    Comment: (NOTE) The Coronavirus on the Respiratory Panel, DOES NOT test for the novel  Coronavirus (2019 nCoV)    Coronavirus HKU1 NOT DETECTED NOT DETECTED Final   Coronavirus NL63 NOT DETECTED NOT DETECTED Final   Coronavirus OC43 NOT DETECTED NOT DETECTED Final   Metapneumovirus NOT DETECTED NOT DETECTED Final   Rhinovirus / Enterovirus DETECTED (A) NOT DETECTED Final   Influenza A NOT DETECTED NOT DETECTED Final   Influenza B NOT DETECTED NOT DETECTED Final   Parainfluenza Virus 1 NOT DETECTED NOT DETECTED Final   Parainfluenza Virus 2 NOT DETECTED NOT DETECTED Final   Parainfluenza Virus 3 NOT DETECTED NOT DETECTED Final   Parainfluenza Virus 4 NOT DETECTED NOT DETECTED Final   Respiratory Syncytial Virus NOT DETECTED NOT DETECTED Final   Bordetella pertussis NOT DETECTED NOT DETECTED Final    Bordetella Parapertussis NOT DETECTED NOT DETECTED Final   Chlamydophila pneumoniae NOT DETECTED NOT DETECTED Final   Mycoplasma pneumoniae NOT DETECTED NOT DETECTED Final    Comment: Performed at [facility], [address], [place], [place] [address]         Radiology Studies: ECHOCARDIOGRAM COMPLETE Result Date: 03/29/2023    ECHOCARDIOGRAM REPORT   Patient Name:   Connie Snyder [facility] Date of Exam: 03/29/2023 Medical Rec #:  [id number]       Height:       64.0 in Accession #:    [id number]      Weight:       115.6 lb Date of Birth:  Aug 21, 1927      BSA:  1.550 m Patient Age:    89 years        BP:           153/96 mmHg Patient Gender: F               HR:           78 bpm. Exam Location:  Inpatient Procedure: 2D Echo, Cardiac Doppler and Color Doppler Indications:     Chest Pain R07.9  History:         Patient has prior history of Echocardiogram examinations, most                  recent 04/19/2022. Risk Factors:Former Smoker. History of                  pulmonary embolism, Altered mental status/Confusion.  Sonographer:     Celesta Gentile RCS Referring Phys:  [id number] Alan Mulder Diagnosing Phys: Arvilla Meres MD  Sonographer Comments: Somewhat difficult exam due to patient confusion at times. IMPRESSIONS  1. Left ventricular ejection fraction, by estimation, is 65 to 70%. The left ventricle has normal function. The left ventricle has no regional wall motion abnormalities. There is mild concentric left ventricular hypertrophy. Left ventricular diastolic parameters are consistent with Grade I diastolic dysfunction (impaired relaxation).  2. Right ventricular systolic function is normal. The right ventricular size is normal. There is severely elevated pulmonary artery systolic pressure. The estimated right ventricular systolic pressure is 70.7 mmHg.  3. Left atrial size was mild to moderately dilated.  4. Right atrial size was moderately dilated.  5. The mitral valve is degenerative. Mild  mitral valve regurgitation. No evidence of mitral stenosis.  6. The aortic valve is tricuspid. There is mild calcification of the aortic valve. Aortic valve regurgitation is trivial. Mild aortic valve stenosis. Aortic valve area, by VTI measures 1.23 cm. Aortic valve mean gradient measures 14.0 mmHg. Aortic valve Vmax measures 2.51 m/s.  7. The inferior vena cava is dilated in size with <50% respiratory variability, suggesting right atrial pressure of 15 mmHg. FINDINGS  Left Ventricle: Left ventricular ejection fraction, by estimation, is 65 to 70%. The left ventricle has normal function. The left ventricle has no regional wall motion abnormalities. The left ventricular internal cavity size was normal in size. There is  mild concentric left ventricular hypertrophy. Left ventricular diastolic parameters are consistent with Grade I diastolic dysfunction (impaired relaxation). Right Ventricle: The right ventricular size is normal. No increase in right ventricular wall thickness. Right ventricular systolic function is normal. There is severely elevated pulmonary artery systolic pressure. The tricuspid regurgitant velocity is 3.73 m/s, and with an assumed right atrial pressure of 15 mmHg, the estimated right ventricular systolic pressure is 70.7 mmHg. Left Atrium: Left atrial size was mild to moderately dilated. Right Atrium: Right atrial size was moderately dilated. Pericardium: There is no evidence of pericardial effusion. Mitral Valve: The mitral valve is degenerative in appearance. There is mild calcification of the mitral valve leaflet(s). Mild mitral annular calcification. Mild mitral valve regurgitation. No evidence of mitral valve stenosis. Tricuspid Valve: The tricuspid valve is normal in structure. Tricuspid valve regurgitation is mild . No evidence of tricuspid stenosis. Aortic Valve: The aortic valve is tricuspid. There is mild calcification of the aortic valve. Aortic valve regurgitation is trivial. Mild  aortic stenosis is present. Aortic valve mean gradient measures 14.0 mmHg. Aortic valve peak gradient measures 25.2 mmHg. Aortic valve area, by VTI measures 1.23 cm. Pulmonic Valve: The pulmonic  valve was not well visualized. Pulmonic valve regurgitation is not visualized. No evidence of pulmonic stenosis. Aorta: The aortic root is normal in size and structure. Venous: The inferior vena cava is dilated in size with less than 50% respiratory variability, suggesting right atrial pressure of 15 mmHg. IAS/Shunts: No atrial level shunt detected by color flow Doppler.  LEFT VENTRICLE PLAX 2D LVIDd:         3.40 cm   Diastology LVIDs:         2.40 cm   LV e' medial:    4.57 cm/s LV PW:         1.00 cm   LV E/e' medial:  21.3 LV IVS:        1.10 cm   LV e' lateral:   5.44 cm/s LVOT diam:     1.80 cm   LV E/e' lateral: 17.9 LV SV:         60 LV SV Index:   38 LVOT Area:     2.54 cm  RIGHT VENTRICLE RV S prime:     13.70 cm/s TAPSE (M-mode): 2.4 cm LEFT ATRIUM             Index        RIGHT ATRIUM           Index LA diam:        3.10 cm 2.00 cm/m   RA Area:     19.90 cm LA Vol (A2C):   44.7 ml 28.84 ml/m  RA Volume:   55.00 ml  35.49 ml/m LA Vol (A4C):   53.9 ml 34.78 ml/m LA Biplane Vol: 48.7 ml 31.42 ml/m  AORTIC VALVE AV Area (Vmax):    1.50 cm AV Area (Vmean):   1.26 cm AV Area (VTI):     1.23 cm AV Vmax:           251.00 cm/s AV Vmean:          171.000 cm/s AV VTI:            0.485 m AV Peak Grad:      25.2 mmHg AV Mean Grad:      14.0 mmHg LVOT Vmax:         148.00 cm/s LVOT Vmean:        84.600 cm/s LVOT VTI:          0.234 m LVOT/AV VTI ratio: 0.48  AORTA Ao Root diam: 3.20 cm MITRAL VALVE                TRICUSPID VALVE MV Area (PHT): 2.01 cm     TR Peak grad:   55.7 mmHg MV Decel Time: 377 msec     TR Vmax:        373.00 cm/s MV E velocity: 97.40 cm/s MV A velocity: 141.00 cm/s  SHUNTS MV E/A ratio:  0.69         Systemic VTI:  0.23 m                             Systemic Diam: 1.80 cm Arvilla Meres MD  Electronically signed by Arvilla Meres MD Signature Date/Time: 03/29/2023/1:30:30 PM    Final (Updated)         Scheduled Meds:  feeding supplement  1 Container Oral BID BM   mirtazapine  15 mg Oral QHS   pantoprazole (PROTONIX) IV  40 mg Intravenous Q12H   Continuous Infusions:  azithromycin  250 mL/hr at 03/30/23 1511   cefTRIAXone (ROCEPHIN)  IV 1 g (03/30/23 2106)     LOS: 2 days    Time spent: 35 minutes    Larah Kuntzman A Gwendalynn Eckstrom, MD [facility]   If 7PM-7AM, please contact night-coverage [email or web address]  03/31/2023, 7:33 AM

## 2023-03-31 NOTE — Plan of Care (Signed)
Plan of care and goals reviewed with patients son due to patient being alert to self, time given for questions, patient handbook/guide at bedside, side rails up with call bell in reach, bed in low locked position, son remains at bedside.  Problem: Education: Goal: Knowledge of General Education information will improve Description: Including pain rating scale, medication(s)/side effects and non-pharmacologic comfort measures Outcome: Progressing   Problem: Health Behavior/Discharge Planning: Goal: Ability to manage health-related needs will improve Outcome: Progressing   Problem: Clinical Measurements: Goal: Ability to maintain clinical measurements within normal limits will improve Outcome: Progressing Goal: Will remain free from infection Outcome: Progressing Goal: Diagnostic test results will improve Outcome: Progressing Goal: Respiratory complications will improve Outcome: Progressing Goal: Cardiovascular complication will be avoided Outcome: Progressing   Problem: Activity: Goal: Risk for activity intolerance will decrease Outcome: Progressing   Problem: Nutrition: Goal: Adequate nutrition will be maintained Outcome: Progressing   Problem: Coping: Goal: Level of anxiety will decrease Outcome: Progressing   Problem: Elimination: Goal: Will not experience complications related to bowel motility Outcome: Progressing Goal: Will not experience complications related to urinary retention Outcome: Progressing   Problem: Pain Management: Goal: General experience of comfort will improve Outcome: Progressing   Problem: Safety: Goal: Ability to remain free from injury will improve Outcome: Progressing   Problem: Skin Integrity: Goal: Risk for impaired skin integrity will decrease Outcome: Progressing

## 2023-04-01 DIAGNOSIS — J189 Pneumonia, unspecified organism: Secondary | ICD-10-CM | POA: Diagnosis not present

## 2023-04-01 LAB — CBC
HCT: 23.9 % — ABNORMAL LOW (ref 36.0–46.0)
Hemoglobin: 8.2 g/dL — ABNORMAL LOW (ref 12.0–15.0)
MCH: 25.8 pg — ABNORMAL LOW (ref 26.0–34.0)
MCHC: 34.3 g/dL (ref 30.0–36.0)
MCV: 75.2 fL — ABNORMAL LOW (ref 80.0–100.0)
Platelets: 312 10*3/uL (ref 150–400)
RBC: 3.18 MIL/uL — ABNORMAL LOW (ref 3.87–5.11)
RDW: 15.9 % — ABNORMAL HIGH (ref 11.5–15.5)
WBC: 4.9 10*3/uL (ref 4.0–10.5)
nRBC: 0 % (ref 0.0–0.2)

## 2023-04-01 MED ORDER — APIXABAN 2.5 MG PO TABS
2.5000 mg | ORAL_TABLET | Freq: Two times a day (BID) | ORAL | Status: DC
  Administered 2023-04-01 – 2023-04-05 (×8): 2.5 mg via ORAL
  Filled 2023-04-01 (×8): qty 1

## 2023-04-01 MED ORDER — PANTOPRAZOLE SODIUM 40 MG PO TBEC
40.0000 mg | DELAYED_RELEASE_TABLET | Freq: Two times a day (BID) | ORAL | Status: DC
  Administered 2023-04-01 – 2023-04-05 (×9): 40 mg via ORAL
  Filled 2023-04-01 (×9): qty 1

## 2023-04-01 NOTE — Progress Notes (Signed)
PROGRESS NOTE    Connie Snyder  UEA:[id number] DOB: 1927/06/25 DOA: 03/28/2023 PCP: Georgianne Fick, MD   Brief Narrative: 89 year old with past medical history significant for arthritis, pulmonary embolism on Xarelto,  Sjogren syndrome, Dementia,  presented with confusion and left arm pain.  She was also having dark tarry stool.  Patient was found to have pneumonia, chest x-ray showed right upper lobe airspace opacity, fever.   Assessment & Plan:   Principal Problem:   Community acquired pneumonia of right upper lobe of lung Active Problems:   Normocytic normochromic anemia   Malnutrition of moderate degree   Vascular dementia without behavioral disturbance (HCC)   Acute blood loss anemia   History of pulmonary embolism   1-Community-acquired pneumonia right upper lobe of the lung -Patient presented with shortness of breath, fever chest x-ray right upper lobe airspace opacity. -Continue with IV ceftriaxone and azithromycin -Operatory panel positive for rhinovirus.  Supportive care Afebrile. Plan to Discharge home tomorrow.   Elevation troponin -Likely secondary to demand ischemia in the setting of pneumonia -Troponin flat 12-38. -Echo: Ejection fraction 65 to 70%.  No regional wall motion abnormality.  Diastolic grade 1 dysfunction.  Right ventricular systolic function is normal.  Severely elevated pulmonary arterial pressure. Needs Out patient follow up./    Acute on chronic normocytic anemia, ABLA likely secondary to upper GI bleed: -Baseline hemoglobin 10-12 presented with a hemoglobin of 8.- -GI consulted and recommended supportive care.  IV PPI -Plan is to resume anticoagulation in 48 hours and if no further  bleeding B12 deficiency; started  B12 supplement.  Plan to resume Eliquis. Hb stable.   History of pulmonary embolism: 2022 Resume Eliquis today.    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   Weight as of this encounter: 52.4 kg.   DVT prophylaxis: SCD, no anticoagulation due to concern for GI bleed.  Code Status: Full code Family Communication: Disposition Plan:  Status is: Inpatient Remains inpatient appropriate because: Management of pneumonia    Consultants:  none  Procedures:  none  Antimicrobials:    Subjective: Alert, this am. Breathing better.   Objective: Vitals:   03/31/23 0521 03/31/23 2020 04/01/23 0448 04/01/23 1233  BP: 109/77 120/68 113/65 (!) 98/59  Pulse: 78 79 66 78  Resp: 18 16 20 16   Temp: 99.1 F (37.3 C) 99.2 F (37.3 C) 98.3 F (36.8 C) 98.8 F (37.1 C)  TempSrc: Oral Oral Oral Oral  SpO2: 98% 100% 99% 100%  Weight:      Height:        Intake/Output Summary (Last 24 hours) at 04/01/2023 1520 Last data filed at 04/01/2023 1233 Gross per 24 hour  Intake 480 ml  Output 1550 ml  Net -1070 ml   Filed Weights   03/28/23 2132  Weight: 52.4 kg    Examination:  General exam: NAD Respiratory system: CTA Cardiovascular system: S 1, S 2 RRR Gastrointestinal system: BS present, soft, nt.     Data Reviewed: I have personally reviewed following labs and imaging studies  CBC: Recent Labs  Lab 03/29/23 1318 03/29/23 2107 03/30/23 0501 03/31/23 0443 04/01/23  1134  WBC 10.4 10.9* 8.3 7.3 4.9  HGB 7.8* 8.2* 7.8* 7.8* 8.2*  HCT 22.1* 24.2* 22.7* 23.4* 23.9*  MCV 74.2* 74.0* 74.4* 74.8* 75.2*  PLT 304 255 146* 277 312   Basic Metabolic Panel: Recent Labs  Lab 03/28/23 2152 03/30/23 0501 03/31/23 0443  NA 136 131* 132*  K 3.7 3.1* 3.6  CL 103 103 103  CO2 23 21* 22  GLUCOSE  105* 100* 103*  BUN 22 20 19   CREATININE 0.63 0.57 0.72  CALCIUM 8.9 8.0* 8.1*   GFR: Estimated Creatinine Clearance: 36.3 mL/min (by C-G formula based on SCr of 0.72 mg/dL). Liver Function Tests: No results for input(s): "AST", "ALT", "ALKPHOS", "BILITOT", "PROT", "ALBUMIN" in the last 168 hours. No results for input(s): "LIPASE", "AMYLASE" in the last 168 hours. No results for input(s): "AMMONIA" in the last 168 hours. Coagulation Profile: No results for input(s): "INR", "PROTIME" in the last 168 hours. Cardiac Enzymes: No results for input(s): "CKTOTAL", "CKMB", "CKMBINDEX", "TROPONINI" in the last 168 hours. BNP (last 3 results) No results for input(s): "PROBNP" in the last 8760 hours. HbA1C: No results for input(s): "HGBA1C" in the last 72 hours. CBG: Recent Labs  Lab 03/29/23 1720  GLUCAP 94   Lipid Profile: No results for input(s): "CHOL", "HDL", "LDLCALC", "TRIG", "CHOLHDL", "LDLDIRECT" in the last 72 hours. Thyroid Function Tests: No results for input(s): "TSH", "T4TOTAL", "FREET4", "T3FREE", "THYROIDAB" in the last 72 hours. Anemia Panel: Recent Labs    03/31/23 0443  VITAMINB12 215  FOLATE 9.9  FERRITIN 39  TIBC 325  IRON 18*  RETICCTPCT 1.3   Sepsis Labs: Recent Labs  Lab 03/29/23 1318  PROCALCITON 0.53    Recent Results (from the past 240 hours)  Culture, blood (Routine X 2) w Reflex to ID Panel     Status: None (Preliminary result)   Collection Time: 03/29/23  1:18 PM   Specimen: BLOOD LEFT ARM  Result Value Ref Range Status   Specimen Description   Final    BLOOD LEFT ARM Performed at [facility], [address] [place], [place] [address]    Special Requests   Final    BOTTLES DRAWN AEROBIC ONLY Blood Culture adequate volume Performed at [facility], [address], [place], [place] [address]    Culture   Final    NO GROWTH 3 DAYS Performed at [facility], [address] [place], [place] [address]     Report Status PENDING  Incomplete  Culture, blood (Routine X 2) w Reflex to ID Panel     Status: None (Preliminary result)   Collection Time: 03/29/23  1:23 PM   Specimen: BLOOD LEFT HAND  Result Value Ref Range Status   Specimen Description   Final    BLOOD LEFT HAND Performed at [facility], [address], [place], [place] [address]    Special Requests   Final    BOTTLES DRAWN AEROBIC ONLY Blood Culture results may not be optimal due to an inadequate volume of blood received in culture bottles Performed at [facility], [address], [place], [place] [address]    Culture   Final    NO GROWTH 3 DAYS Performed at [facility], [address] [place], [place] [address]    Report Status PENDING  Incomplete  SARS Coronavirus 2 by RT PCR (hospital order, performed in [facility] hospital lab) *cepheid single result test* Anterior Nasal Swab  Status: None   Collection Time: 03/30/23  4:00 PM   Specimen: Anterior Nasal Swab  Result Value Ref Range Status   SARS Coronavirus 2 by RT PCR NEGATIVE NEGATIVE Final    Comment: (NOTE) SARS-CoV-2 target nucleic acids are NOT DETECTED.  The SARS-CoV-2 RNA is generally detectable in upper and lower respiratory specimens during the acute phase of infection. The lowest concentration of SARS-CoV-2 viral copies this assay can detect is 250 copies / mL. A negative result does not preclude SARS-CoV-2 infection and should not be used as the sole basis for treatment or other patient management decisions.  A negative result may occur with improper specimen collection / handling, submission of specimen other than nasopharyngeal swab, presence of viral mutation(s) within the areas targeted by this assay, and inadequate number of viral copies (<250 copies / mL). A negative result must be combined with clinical observations, patient history, and epidemiological information.  Fact Sheet for Patients:    RoadLapTop.co.za  Fact Sheet for Healthcare Providers: http://kim-miller.com/  This test is not yet approved or  cleared by the [place] FDA and has been authorized for detection and/or diagnosis of SARS-CoV-2 by FDA under an Emergency Use Authorization (EUA).  This EUA will remain in effect (meaning this test can be used) for the duration of the COVID-19 declaration under Section 564(b)(1) of the Act, 21 U.S.C. section 360bbb-3(b)(1), unless the authorization is terminated or revoked sooner.  Performed at [facility], [address], [place], [place] [address]   Respiratory (~20 pathogens) panel by PCR     Status: Abnormal   Collection Time: 03/30/23  4:00 PM   Specimen: Nasopharyngeal Swab; Respiratory  Result Value Ref Range Status   Adenovirus NOT DETECTED NOT DETECTED Final   Coronavirus 229E NOT DETECTED NOT DETECTED Final    Comment: (NOTE) The Coronavirus on the Respiratory Panel, DOES NOT test for the novel  Coronavirus (2019 nCoV)    Coronavirus HKU1 NOT DETECTED NOT DETECTED Final   Coronavirus NL63 NOT DETECTED NOT DETECTED Final   Coronavirus OC43 NOT DETECTED NOT DETECTED Final   Metapneumovirus NOT DETECTED NOT DETECTED Final   Rhinovirus / Enterovirus DETECTED (A) NOT DETECTED Final   Influenza A NOT DETECTED NOT DETECTED Final   Influenza B NOT DETECTED NOT DETECTED Final   Parainfluenza Virus 1 NOT DETECTED NOT DETECTED Final   Parainfluenza Virus 2 NOT DETECTED NOT DETECTED Final   Parainfluenza Virus 3 NOT DETECTED NOT DETECTED Final   Parainfluenza Virus 4 NOT DETECTED NOT DETECTED Final   Respiratory Syncytial Virus NOT DETECTED NOT DETECTED Final   Bordetella pertussis NOT DETECTED NOT DETECTED Final   Bordetella Parapertussis NOT DETECTED NOT DETECTED Final   Chlamydophila pneumoniae NOT DETECTED NOT DETECTED Final   Mycoplasma pneumoniae NOT DETECTED NOT DETECTED Final    Comment:  Performed at [facility], [address] [place], [place] [address]         Radiology Studies: No results found.       Scheduled Meds:  azithromycin  500 mg Oral Daily   cyanocobalamin  1,000 mcg Intramuscular Daily   feeding supplement  1 Container Oral BID BM   mirtazapine  15 mg Oral QHS   pantoprazole  40 mg Oral BID   Continuous Infusions:  cefTRIAXone (ROCEPHIN)  IV 2 g (03/31/23 2232)     LOS: 3 days    Time spent: 35 minutes    Alba Cory, MD [facility]   If  7PM-7AM, please contact night-coverage [email or web address]  04/01/2023, 3:20 PM

## 2023-04-01 NOTE — Progress Notes (Signed)
Physical Therapy Treatment Patient Details Name: Connie Snyder MRN: [id number] DOB: 25-Apr-1927 Today's Date: 04/01/2023   History of Present Illness 89 yr old female admitted to the hospital with L UE pain. She was found to have PNA and acute on chronic anemia due to likely due to upper GI bleed. PMH: arthritis, PE, Sjorens, dementia, L THA    PT Comments  Pt agreeable to working with therapy. She did not wish to ambulate in the hallway but she did request to use the bathroom. Min A to stand, CGA to ambulate. She participated well. Assisted pt back to bed at her request. Will continue to follow pt during this hospital stay.     If plan is discharge home, recommend the following: A little help with walking and/or transfers;A little help with bathing/dressing/bathroom;Assistance with cooking/housework;Assist for transportation;Help with stairs or ramp for entrance   Can travel by private vehicle        Equipment Recommendations  None recommended by PT    Recommendations for Other Services       Precautions / Restrictions Precautions Precautions: Fall Restrictions Weight Bearing Restrictions Per Provider Order: No     Mobility  Bed Mobility Overal bed mobility: Needs Assistance Bed Mobility: Supine to Sit, Sit to Supine     Supine to sit: Contact guard, HOB elevated, Used rails Sit to supine: Contact guard assist, HOB elevated, Used rails   General bed mobility comments: Increased time. Cues provided as needed.    Transfers Overall transfer level: Needs assistance Equipment used: Rolling walker (2 wheels) Transfers: Sit to/from Stand Sit to Stand: Min assist           General transfer comment: x 2. Assist to rise, stabilize, control descent. Cues for safety, hand placement. Increased time.    Ambulation/Gait Ambulation/Gait assistance: Contact guard assist Gait Distance (Feet): 15 Feet (x2) Assistive device: Rolling walker (2 wheels) Gait Pattern/deviations:  Step-through pattern, Decreased stride length       General Gait Details: No LOB with RW use. Very close guarding assist. Slow gait speed.   Stairs             Wheelchair Mobility     Tilt Bed    Modified Rankin (Stroke Patients Only)       Balance Overall balance assessment: Needs assistance         Standing balance support: Bilateral upper extremity supported, During functional activity Standing balance-Leahy Scale: Poor                              Cognition Arousal: Alert Behavior During Therapy: WFL for tasks assessed/performed Overall Cognitive Status: History of cognitive impairments - at baseline Area of Impairment: Memory, Orientation                 Orientation Level: Disoriented to, Place, Time, Situation             General Comments: Oriented to person only        Exercises      General Comments        Pertinent Vitals/Pain Pain Assessment Pain Assessment: Faces Faces Pain Scale: No hurt    Home Living                          Prior Function            PT Goals (current goals can now be found  in the care plan section) Progress towards PT goals: Progressing toward goals    Frequency    Min 1X/week      PT Plan      Co-evaluation              AM-PAC PT "6 Clicks" Mobility   Outcome Measure  Help needed turning from your back to your side while in a flat bed without using bedrails?: A Little Help needed moving from lying on your back to sitting on the side of a flat bed without using bedrails?: A Little Help needed moving to and from a bed to a chair (including a wheelchair)?: A Little Help needed standing up from a chair using your arms (e.g., wheelchair or bedside chair)?: A Little Help needed to walk in hospital room?: A Little Help needed climbing 3-5 steps with a railing? : A Lot 6 Click Score: 17    End of Session Equipment Utilized During Treatment: Gait belt Activity  Tolerance: Patient tolerated treatment well Patient left: in bed;with call bell/phone within reach;with bed alarm set   PT Visit Diagnosis: Muscle weakness (generalized) (M62.81);Difficulty in walking, not elsewhere classified (R26.2)     Time: 1610-9604 PT Time Calculation (min) (ACUTE ONLY): 29 min  Charges:    $Gait Training: 23-37 mins PT General Charges $$ ACUTE PT VISIT: 1 Visit                         Faye Ramsay, PT Acute Rehabilitation  Office: [phone]

## 2023-04-02 DIAGNOSIS — J189 Pneumonia, unspecified organism: Secondary | ICD-10-CM | POA: Diagnosis not present

## 2023-04-02 LAB — CBC
HCT: 21.6 % — ABNORMAL LOW (ref 36.0–46.0)
Hemoglobin: 7.5 g/dL — ABNORMAL LOW (ref 12.0–15.0)
MCH: 25.7 pg — ABNORMAL LOW (ref 26.0–34.0)
MCHC: 34.7 g/dL (ref 30.0–36.0)
MCV: 74 fL — ABNORMAL LOW (ref 80.0–100.0)
Platelets: 281 10*3/uL (ref 150–400)
RBC: 2.92 MIL/uL — ABNORMAL LOW (ref 3.87–5.11)
RDW: 15.9 % — ABNORMAL HIGH (ref 11.5–15.5)
WBC: 5.1 10*3/uL (ref 4.0–10.5)
nRBC: 0 % (ref 0.0–0.2)

## 2023-04-02 MED ORDER — THIAMINE MONONITRATE 100 MG PO TABS
100.0000 mg | ORAL_TABLET | Freq: Every day | ORAL | Status: DC
  Administered 2023-04-02 – 2023-04-05 (×4): 100 mg via ORAL
  Filled 2023-04-02 (×4): qty 1

## 2023-04-02 MED ORDER — SENNOSIDES-DOCUSATE SODIUM 8.6-50 MG PO TABS
1.0000 | ORAL_TABLET | Freq: Two times a day (BID) | ORAL | Status: DC
  Administered 2023-04-02 – 2023-04-05 (×6): 1 via ORAL
  Filled 2023-04-02 (×7): qty 1

## 2023-04-02 MED ORDER — SODIUM CHLORIDE 0.9 % IV SOLN
100.0000 mg | Freq: Once | INTRAVENOUS | Status: AC
  Administered 2023-04-02: 100 mg via INTRAVENOUS
  Filled 2023-04-02: qty 5

## 2023-04-02 MED ORDER — FOLIC ACID 1 MG PO TABS
1.0000 mg | ORAL_TABLET | Freq: Every day | ORAL | Status: DC
  Administered 2023-04-02 – 2023-04-05 (×4): 1 mg via ORAL
  Filled 2023-04-02 (×4): qty 1

## 2023-04-02 NOTE — Progress Notes (Signed)
PROGRESS NOTE    Connie Snyder  NFA:[id number] DOB: [birth year] DOA: 03/28/2023 PCP: Georgianne Fick, MD   Brief Narrative: 89 year old with past medical history significant for arthritis, pulmonary embolism on Xarelto,  Sjogren syndrome, Dementia,  presented with confusion and left arm pain.  She was also having dark tarry stool.  Patient was found to have pneumonia, chest x-ray showed right upper lobe airspace opacity, fever.   Assessment & Plan:   Principal Problem:   Community acquired pneumonia of right upper lobe of lung Active Problems:   Normocytic normochromic anemia   Malnutrition of moderate degree   Vascular dementia without behavioral disturbance (HCC)   Acute blood loss anemia   History of pulmonary embolism   1-Community-acquired pneumonia right upper lobe of the lung -Patient presented with shortness of breath, fever chest x-ray right upper lobe airspace opacity. -Continue with IV ceftriaxone and azithromycin -Operatory panel positive for rhinovirus.  Supportive care Afebrile. Improving.   Elevation troponin -Likely secondary to demand ischemia in the setting of pneumonia -Troponin flat 12-38. -Echo: Ejection fraction 65 to 70%.  No regional wall motion abnormality.  Diastolic grade 1 dysfunction.  Right ventricular systolic function is normal.  Severely elevated pulmonary arterial pressure. Needs Out patient follow up./    Acute on chronic normocytic anemia, ABLA likely secondary to upper GI bleed: -Baseline hemoglobin 10-12 presented with a hemoglobin of 8.- -GI consulted and recommended supportive care.  IV PPI -Plan is to resume anticoagulation in 48 hours and if no further  bleeding B12 deficiency; started  B12 supplement.  Resume Eliquis 12/28. Hb down to 7.5 no BM documented. Continue to monitor.  Will give IV iron and start folic acid.   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   Weight as of this encounter: 52.4 kg.   DVT prophylaxis: SCD, no anticoagulation due to concern for GI bleed.  Code Status: Full code Family Communication: Son 12/28 Disposition Plan:  Status is: Inpatient Remains inpatient appropriate because: home tomorrow if hb increases.     Consultants:  none  Procedures:  none  Antimicrobials:    Subjective: She is alert, report feeling better, breathing better.  Ate 25 % breakfast. Nurse will order magic cup, getting ensure.   Objective: Vitals:   04/01/23 0448 04/01/23 1233 04/01/23 2000 04/02/23 0538  BP: 113/65 (!) 98/59 116/77 118/77  Pulse: 66 78 88 75  Resp: 20 16 14 14   Temp: 98.3 F (36.8 C) 98.8 F (37.1 C) 98.3 F (36.8 C) 98.9 F (37.2 C)  TempSrc: Oral Oral Oral Oral  SpO2: 99% 100% 100% 97%  Weight:      Height:        Intake/Output Summary (Last 24 hours) at 04/02/2023 1131 Last data filed at 04/02/2023 1024 Gross per 24 hour  Intake 680 ml  Output 850 ml  Net -170 ml   Filed Weights   03/28/23 2132  Weight: 52.4 kg    Examination:  General exam: NAD Respiratory system: CTA Cardiovascular system: S 1, S 2 RRR Gastrointestinal system: BS present, soft,  nt     Data Reviewed: I have personally reviewed following labs and imaging studies  CBC: Recent Labs  Lab 03/29/23 2107 03/30/23 0501 03/31/23 0443 04/01/23 1134 04/02/23 0445  WBC 10.9* 8.3 7.3 4.9 5.1  HGB 8.2* 7.8* 7.8* 8.2* 7.5*  HCT 24.2* 22.7* 23.4* 23.9* 21.6*  MCV 74.0* 74.4* 74.8* 75.2* 74.0*  PLT 255 146* 277 312 281   Basic  Metabolic Panel: Recent Labs  Lab 03/28/23 2152 03/30/23 0501 03/31/23 0443  NA 136 131* 132*  K 3.7 3.1* 3.6  CL 103 103 103  CO2 23 21* 22  GLUCOSE 105* 100* 103*  BUN 22 20 19   CREATININE 0.63 0.57 0.72  CALCIUM 8.9 8.0* 8.1*   GFR: Estimated Creatinine Clearance: 36.3 mL/min (by C-G formula based on SCr of 0.72 mg/dL). Liver Function Tests: No results for input(s): "AST", "ALT", "ALKPHOS", "BILITOT", "PROT", "ALBUMIN" in the last 168 hours. No results for input(s): "LIPASE", "AMYLASE" in the last 168 hours. No results for input(s): "AMMONIA" in the last 168 hours. Coagulation Profile: No results for input(s): "INR", "PROTIME" in the last 168 hours. Cardiac Enzymes: No results for input(s): "CKTOTAL", "CKMB", "CKMBINDEX", "TROPONINI" in the last 168 hours. BNP (last 3 results) No results for input(s): "PROBNP" in the last 8760 hours. HbA1C: No results for input(s): "HGBA1C" in the last 72 hours. CBG: Recent Labs  Lab 03/29/23 1720  GLUCAP 94   Lipid Profile: No results for input(s): "CHOL", "HDL", "LDLCALC", "TRIG", "CHOLHDL", "LDLDIRECT" in the last 72 hours. Thyroid Function Tests: No results for input(s): "TSH", "T4TOTAL", "FREET4", "T3FREE", "THYROIDAB" in the last 72 hours. Anemia Panel: Recent Labs    03/31/23 0443  VITAMINB12 215  FOLATE 9.9  FERRITIN 39  TIBC 325  IRON 18*  RETICCTPCT 1.3   Sepsis Labs: Recent Labs  Lab 03/29/23 1318  PROCALCITON 0.53    Recent Results (from the past 240 hours)  Culture, blood (Routine X 2) w Reflex to ID Panel     Status: None (Preliminary result)   Collection Time: 03/29/23  1:18 PM   Specimen: BLOOD LEFT ARM  Result Value Ref Range Status   Specimen Description   Final    BLOOD LEFT ARM Performed at [facility], [address], [place], [place] [address]    Special Requests   Final    BOTTLES DRAWN AEROBIC ONLY Blood Culture adequate volume Performed at [facility], [address], [place], [place] [address]    Culture   Final    NO GROWTH 4 DAYS Performed at [facility], [address] [place], [place] [address]    Report Status PENDING  Incomplete  Culture, blood (Routine X 2) w Reflex to ID Panel     Status: None (Preliminary result)   Collection Time: 03/29/23  1:23 PM   Specimen: BLOOD LEFT HAND  Result Value Ref Range Status   Specimen Description   Final    BLOOD LEFT HAND Performed at [facility], [address] [place], [place] [address]    Special Requests   Final    BOTTLES DRAWN AEROBIC ONLY Blood Culture results may not be optimal due to an inadequate volume of blood received in culture bottles Performed at [facility], [address] [place], [place] [address]    Culture   Final    NO GROWTH 4 DAYS Performed at [facility], [address], [place], [place] [address]  Report Status PENDING  Incomplete  SARS Coronavirus 2 by RT PCR (hospital order, performed in [facility] hospital lab) *cepheid single result test* Anterior Nasal Swab     Status: None   Collection Time: 03/30/23  4:00 PM   Specimen: Anterior Nasal Swab  Result Value Ref Range Status   SARS Coronavirus 2 by RT PCR NEGATIVE NEGATIVE Final    Comment: (NOTE) SARS-CoV-2 target nucleic acids are NOT DETECTED.  The SARS-CoV-2 RNA is generally detectable in upper and lower respiratory specimens during the acute phase of infection. The lowest concentration of SARS-CoV-2 viral copies this assay can detect is 250 copies / mL. A negative result does not preclude SARS-CoV-2 infection and should not be used as the sole basis for treatment or other patient management decisions.  A negative result may occur with improper specimen collection / handling, submission of specimen other than nasopharyngeal swab, presence of viral mutation(s) within the areas targeted by this assay, and inadequate number of viral copies (<250 copies / mL). A  negative result must be combined with clinical observations, patient history, and epidemiological information.  Fact Sheet for Patients:   RoadLapTop.co.za  Fact Sheet for Healthcare Providers: http://kim-miller.com/  This test is not yet approved or  cleared by the [place] FDA and has been authorized for detection and/or diagnosis of SARS-CoV-2 by FDA under an Emergency Use Authorization (EUA).  This EUA will remain in effect (meaning this test can be used) for the duration of the COVID-19 declaration under Section 564(b)(1) of the Act, 21 U.S.C. section 360bbb-3(b)(1), unless the authorization is terminated or revoked sooner.  Performed at [facility], [address] [place], [place] [address]   Respiratory (~20 pathogens) panel by PCR     Status: Abnormal   Collection Time: 03/30/23  4:00 PM   Specimen: Nasopharyngeal Swab; Respiratory  Result Value Ref Range Status   Adenovirus NOT DETECTED NOT DETECTED Final   Coronavirus 229E NOT DETECTED NOT DETECTED Final    Comment: (NOTE) The Coronavirus on the Respiratory Panel, DOES NOT test for the novel  Coronavirus (2019 nCoV)    Coronavirus HKU1 NOT DETECTED NOT DETECTED Final   Coronavirus NL63 NOT DETECTED NOT DETECTED Final   Coronavirus OC43 NOT DETECTED NOT DETECTED Final   Metapneumovirus NOT DETECTED NOT DETECTED Final   Rhinovirus / Enterovirus DETECTED (A) NOT DETECTED Final   Influenza A NOT DETECTED NOT DETECTED Final   Influenza B NOT DETECTED NOT DETECTED Final   Parainfluenza Virus 1 NOT DETECTED NOT DETECTED Final   Parainfluenza Virus 2 NOT DETECTED NOT DETECTED Final   Parainfluenza Virus 3 NOT DETECTED NOT DETECTED Final   Parainfluenza Virus 4 NOT DETECTED NOT DETECTED Final   Respiratory Syncytial Virus NOT DETECTED NOT DETECTED Final   Bordetella pertussis NOT DETECTED NOT DETECTED Final   Bordetella Parapertussis NOT DETECTED NOT  DETECTED Final   Chlamydophila pneumoniae NOT DETECTED NOT DETECTED Final   Mycoplasma pneumoniae NOT DETECTED NOT DETECTED Final    Comment: Performed at [facility], [address], [place], [place] [address]         Radiology Studies: No results found.       Scheduled Meds:  apixaban  2.5 mg Oral BID   cyanocobalamin  1,000 mcg Intramuscular Daily   feeding supplement  1 Container Oral BID BM   folic acid  1 mg Oral Daily   mirtazapine  15 mg Oral QHS   pantoprazole  40 mg Oral  BID   senna-docusate  1 tablet Oral BID   thiamine  100 mg Oral Daily   Continuous Infusions:  cefTRIAXone (ROCEPHIN)  IV Stopped (04/01/23 2323)     LOS: 4 days    Time spent: 35 minutes    Delane Wessinger A Myliyah Rebuck, MD [facility]   If 7PM-7AM, please contact night-coverage [email or web address]  04/02/2023, 11:31 AM

## 2023-04-03 DIAGNOSIS — J189 Pneumonia, unspecified organism: Secondary | ICD-10-CM | POA: Diagnosis not present

## 2023-04-03 LAB — CULTURE, BLOOD (ROUTINE X 2)
Culture: NO GROWTH
Culture: NO GROWTH
Special Requests: ADEQUATE

## 2023-04-03 LAB — BASIC METABOLIC PANEL
Anion gap: 8 (ref 5–15)
BUN: 17 mg/dL (ref 8–23)
CO2: 25 mmol/L (ref 22–32)
Calcium: 8.6 mg/dL — ABNORMAL LOW (ref 8.9–10.3)
Chloride: 104 mmol/L (ref 98–111)
Creatinine, Ser: 0.63 mg/dL (ref 0.44–1.00)
GFR, Estimated: >60 mL/min (ref >60)
Glucose, Bld: 138 mg/dL — ABNORMAL HIGH (ref 70–99)
Potassium: 3.5 mmol/L (ref 3.5–5.1)
Sodium: 137 mmol/L (ref 135–145)

## 2023-04-03 LAB — CBC
HCT: 23.5 % — ABNORMAL LOW (ref 36.0–46.0)
Hemoglobin: 7.7 g/dL — ABNORMAL LOW (ref 12.0–15.0)
MCH: 25.2 pg — ABNORMAL LOW (ref 26.0–34.0)
MCHC: 32.8 g/dL (ref 30.0–36.0)
MCV: 77 fL — ABNORMAL LOW (ref 80.0–100.0)
Platelets: 325 10*3/uL (ref 150–400)
RBC: 3.05 MIL/uL — ABNORMAL LOW (ref 3.87–5.11)
RDW: 16.1 % — ABNORMAL HIGH (ref 11.5–15.5)
WBC: 4.6 10*3/uL (ref 4.0–10.5)
nRBC: 0 % (ref 0.0–0.2)

## 2023-04-03 LAB — TSH: TSH: 3.724 u[IU]/mL (ref 0.350–4.500)

## 2023-04-03 LAB — PREPARE RBC (CROSSMATCH)

## 2023-04-03 MED ORDER — FUROSEMIDE 10 MG/ML IJ SOLN
20.0000 mg | Freq: Once | INTRAMUSCULAR | Status: AC
  Administered 2023-04-03: 20 mg via INTRAVENOUS
  Filled 2023-04-03: qty 2

## 2023-04-03 MED ORDER — ORAL CARE MOUTH RINSE: 15.0000 mL | OROMUCOSAL | Status: DC | PRN

## 2023-04-03 MED ORDER — SODIUM CHLORIDE 0.9% IV SOLUTION: Freq: Once | INTRAVENOUS | Status: AC

## 2023-04-03 MED ORDER — CEFDINIR 300 MG PO CAPS
300.0000 mg | ORAL_CAPSULE | Freq: Two times a day (BID) | ORAL | Status: AC
  Administered 2023-04-03 – 2023-04-05 (×4): 300 mg via ORAL
  Filled 2023-04-03 (×4): qty 1

## 2023-04-03 NOTE — Progress Notes (Signed)
PROGRESS NOTE    Connie Snyder  ZOX:[id number] DOB: [birth year] DOA: 03/28/2023 PCP: Georgianne Fick, MD   Brief Narrative: 89 year old with past medical history significant for arthritis, pulmonary embolism on Xarelto,  Sjogren syndrome, Dementia,  presented with confusion and left arm pain.  She was also having dark tarry stool.  Patient was found to have pneumonia, chest x-ray showed right upper lobe airspace opacity, fever.   Assessment & Plan:   Principal Problem:   Community acquired pneumonia of right upper lobe of lung Active Problems:   Normocytic normochromic anemia   Malnutrition of moderate degree   Vascular dementia without behavioral disturbance (HCC)   Acute blood loss anemia   History of pulmonary embolism   1-Community-acquired pneumonia right upper lobe of the lung -Patient presented with shortness of breath, fever chest x-ray right upper lobe airspace opacity. -Completed  azithromycin and Ceftriaxone for 5 days. Will complete 7 days with omnicef -Operatory panel positive for rhinovirus.  Supportive care Afebrile. Improving.   Elevation troponin -Likely secondary to demand ischemia in the setting of pneumonia -Troponin flat 12-38. -Echo: Ejection fraction 65 to 70%.  No regional wall motion abnormality.  Diastolic grade 1 dysfunction.  Right ventricular systolic function is normal.  Severely elevated pulmonary arterial pressure. Needs Out patient follow up./    Acute on chronic normocytic anemia, ABLA likely secondary to upper GI bleed: -Baseline hemoglobin 10-12 presented with a hemoglobin of 8.- -GI consulted and recommended supportive care.  IV PPI -Plan is to resume anticoagulation in 48 hours and if no further  bleeding B12 deficiency; started  B12 supplement.  Resume Eliquis 12/28. Hb down to 7.5 no BM documented. Continue to monitor.  Received IV iron and started folic acid.  Hb stable at 7.1. will proceed with Blood transfusion to see if  that will help with weakness.   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   Weight as of this encounter: 52.4 kg.   DVT prophylaxis: SCD, no anticoagulation due to concern for GI bleed.  Code Status: Full code Family Communication: Son 12/29 Disposition Plan:  Status is: Inpatient Remains inpatient appropriate because: home tomorrow if hb increases.     Consultants:  none  Procedures:  none  Antimicrobials:    Subjective: She is sleepy, no new complaints.   Objective: Vitals:   04/03/23 0429 04/03/23 0958 04/03/23 1024 04/03/23 1252  BP: 113/69 120/73 (!) 112/59 112/65  Pulse: 63 60 70 73  Resp: 14 20 20 20   Temp: 98.4 F (36.9 C) 98.6 F (37 C) 98.7 F (37.1 C) 98.2 F (36.8 C)  TempSrc: Oral Oral  Oral  SpO2: 98% 97% 98% 98%  Weight:      Height:        Intake/Output Summary (Last 24 hours) at 04/03/2023 1603 Last data filed at 04/03/2023 1300 Gross per 24 hour  Intake 836 ml  Output 1400 ml  Net -564 ml   Filed Weights   03/28/23 2132  Weight: 52.4 kg    Examination:  General exam: NAD Respiratory system: CTA Cardiovascular  system:  S1. S 2 RRR Gastrointestinal system: BS present, soft, nt     Data Reviewed: I have personally reviewed following labs and imaging studies  CBC: Recent Labs  Lab 03/30/23 0501 03/31/23 0443 04/01/23 1134 04/02/23 0445 04/03/23 0425  WBC 8.3 7.3 4.9 5.1 4.6  HGB 7.8* 7.8* 8.2* 7.5* 7.7*  HCT 22.7* 23.4* 23.9* 21.6* 23.5*  MCV 74.4* 74.8* 75.2* 74.0*  77.0*  PLT 146* 277 312 281 325   Basic Metabolic Panel: Recent Labs  Lab 03/28/23 2152 03/30/23 0501 03/31/23 0443  NA 136 131* 132*  K 3.7 3.1* 3.6  CL 103 103 103  CO2 23 21* 22  GLUCOSE 105* 100* 103*  BUN 22 20 19   CREATININE 0.63 0.57 0.72  CALCIUM 8.9 8.0* 8.1*   GFR: Estimated Creatinine Clearance: 36.3 mL/min (by C-G formula based on SCr of 0.72 mg/dL). Liver Function Tests: No results for input(s): "AST", "ALT", "ALKPHOS", "BILITOT", "PROT", "ALBUMIN" in the last 168 hours. No results for input(s): "LIPASE", "AMYLASE" in the last 168 hours. No results for input(s): "AMMONIA" in the last 168 hours. Coagulation Profile: No results for input(s): "INR", "PROTIME" in the last 168 hours. Cardiac Enzymes: No results for input(s): "CKTOTAL", "CKMB", "CKMBINDEX", "TROPONINI" in the last 168 hours. BNP (last 3 results) No results for input(s): "PROBNP" in the last 8760 hours. HbA1C: No results for input(s): "HGBA1C" in the last 72 hours. CBG: Recent Labs  Lab 03/29/23 1720  GLUCAP 94   Lipid Profile: No results for input(s): "CHOL", "HDL", "LDLCALC", "TRIG", "CHOLHDL", "LDLDIRECT" in the last 72 hours. Thyroid Function Tests: No results for input(s): "TSH", "T4TOTAL", "FREET4", "T3FREE", "THYROIDAB" in the last 72 hours. Anemia Panel: No results for input(s): "VITAMINB12", "FOLATE", "FERRITIN", "TIBC", "IRON", "RETICCTPCT" in the last 72 hours.  Sepsis Labs: Recent Labs  Lab 03/29/23 1318  PROCALCITON 0.53    Recent Results (from the past 240 hours)  Culture, blood (Routine X 2) w Reflex to ID Panel     Status: None   Collection Time: 03/29/23  1:18 PM   Specimen: BLOOD LEFT ARM  Result Value Ref Range Status   Specimen Description   Final    BLOOD LEFT ARM Performed at [facility], [address] [place], [place] [address]    Special Requests   Final    BOTTLES DRAWN AEROBIC ONLY Blood Culture adequate volume Performed at [facility], [address], [place], [place] [address]    Culture   Final    NO GROWTH 5 DAYS Performed at [facility], [address] [place], [place] [address]    Report Status 04/03/2023 FINAL  Final  Culture, blood (Routine X 2) w Reflex to ID Panel     Status: None   Collection Time: 03/29/23  1:23 PM   Specimen: BLOOD LEFT HAND  Result Value Ref Range Status   Specimen Description   Final    BLOOD LEFT HAND Performed at [facility], [address], [place], [place] [address]    Special Requests   Final    BOTTLES DRAWN AEROBIC ONLY Blood Culture results may not be optimal due to an inadequate volume of blood received in culture bottles Performed at [facility], [address] [place], [place] [address]    Culture   Final    NO GROWTH 5 DAYS Performed at [facility], [address] [place], [place] [address]    Report  Status 04/03/2023 FINAL  Final  SARS Coronavirus 2 by RT PCR (hospital order, performed in [facility] hospital lab) *cepheid single result test* Anterior Nasal Swab     Status: None   Collection Time: 03/30/23  4:00 PM   Specimen: Anterior Nasal Swab  Result Value Ref Range Status   SARS Coronavirus 2 by RT PCR NEGATIVE NEGATIVE Final    Comment: (NOTE) SARS-CoV-2 target nucleic acids are NOT DETECTED.  The SARS-CoV-2 RNA is generally detectable in upper and lower respiratory specimens during the acute phase of infection. The lowest concentration of SARS-CoV-2 viral copies this assay can detect is 250 copies / mL. A negative result does not preclude SARS-CoV-2 infection and should not be used as the sole basis for treatment or other patient management decisions.  A negative result may occur with improper specimen collection / handling, submission of specimen other than nasopharyngeal swab, presence of viral mutation(s) within the areas targeted by this assay, and inadequate number of viral copies (<250 copies /  mL). A negative result must be combined with clinical observations, patient history, and epidemiological information.  Fact Sheet for Patients:   RoadLapTop.co.za  Fact Sheet for Healthcare Providers: http://kim-miller.com/  This test is not yet approved or  cleared by the [place] FDA and has been authorized for detection and/or diagnosis of SARS-CoV-2 by FDA under an Emergency Use Authorization (EUA).  This EUA will remain in effect (meaning this test can be used) for the duration of the COVID-19 declaration under Section 564(b)(1) of the Act, 21 U.S.C. section 360bbb-3(b)(1), unless the authorization is terminated or revoked sooner.  Performed at [facility], [address] [place], [place] [address]   Respiratory (~20 pathogens) panel by PCR     Status: Abnormal   Collection Time: 03/30/23  4:00 PM   Specimen: Nasopharyngeal Swab; Respiratory  Result Value Ref Range Status   Adenovirus NOT DETECTED NOT DETECTED Final   Coronavirus 229E NOT DETECTED NOT DETECTED Final    Comment: (NOTE) The Coronavirus on the Respiratory Panel, DOES NOT test for the novel  Coronavirus (2019 nCoV)    Coronavirus HKU1 NOT DETECTED NOT DETECTED Final   Coronavirus NL63 NOT DETECTED NOT DETECTED Final   Coronavirus OC43 NOT DETECTED NOT DETECTED Final   Metapneumovirus NOT DETECTED NOT DETECTED Final   Rhinovirus / Enterovirus DETECTED (A) NOT DETECTED Final   Influenza A NOT DETECTED NOT DETECTED Final   Influenza B NOT DETECTED NOT DETECTED Final   Parainfluenza Virus 1 NOT DETECTED NOT DETECTED Final   Parainfluenza Virus 2 NOT DETECTED NOT DETECTED Final   Parainfluenza Virus 3 NOT DETECTED NOT DETECTED Final   Parainfluenza Virus 4 NOT DETECTED NOT DETECTED Final   Respiratory Syncytial Virus NOT DETECTED NOT DETECTED Final   Bordetella pertussis NOT DETECTED NOT DETECTED Final   Bordetella Parapertussis NOT DETECTED  NOT DETECTED Final   Chlamydophila pneumoniae NOT DETECTED NOT DETECTED Final   Mycoplasma pneumoniae NOT DETECTED NOT DETECTED Final    Comment: Performed at [facility], [address], [place], [place] [address]         Radiology Studies: No results found.       Scheduled Meds:  apixaban  2.5 mg Oral BID   cyanocobalamin  1,000 mcg Intramuscular Daily   feeding supplement  1 Container Oral BID BM   folic acid  1 mg Oral Daily   mirtazapine  15 mg Oral QHS   pantoprazole  40 mg Oral  BID   senna-docusate  1 tablet Oral BID   thiamine  100 mg Oral Daily   Continuous Infusions:     LOS: 5 days    Time spent: 35 minutes    Asyah Candler A Dawan Farney, MD [facility]   If 7PM-7AM, please contact night-coverage [email or web address]  04/03/2023, 4:03 PM

## 2023-04-03 NOTE — Plan of Care (Signed)
°  Problem: Nutrition: °Goal: Adequate nutrition will be maintained °Outcome: Progressing °  °Problem: Coping: °Goal: Level of anxiety will decrease °Outcome: Progressing °  °Problem: Safety: °Goal: Ability to remain free from injury will improve °Outcome: Progressing °  °

## 2023-04-03 NOTE — Progress Notes (Signed)
PT Cancellation Note  Patient Details Name: GENEE FIALKOWSKI MRN: [id number] DOB: Aug 06, 1927   Cancelled Treatment:    Reason Eval/Treat Not Completed: Patient declined, no reason specified    Faye Ramsay, PT Acute Rehabilitation  Office: [phone]

## 2023-04-03 NOTE — Progress Notes (Signed)
SLP Cancellation Note  Patient Details Name: Connie Snyder MRN: [id number] DOB: 09-14-1927   Cancelled treatment:       Reason Eval/Treat Not Completed: Patient declined, no reason specified Patient pleasantly confused and declined PO's, stating that she wanted to rest. SLP spoke with RN who reported that patient has been very awake in later PM, evening but during the day she is primarily sleeping. RN did report she is eating about 25% of meals but also drinking Boost Breeze supplements and eating magic cups. SLP will continue efforts.     Angela Nevin, MA, CCC-SLP Speech Therapy

## 2023-04-03 NOTE — Progress Notes (Signed)
Occupational Therapy Treatment Patient Details Name: Connie Snyder MRN: [id number] DOB: September 09, 1927 Today's Date: 04/03/2023   History of present illness 89 yr old female admitted to the hospital with L UE pain. She was found to have PNA and acute on chronic anemia due to likely due to upper GI bleed. PMH: arthritis, PE, Sjorens, dementia, L THA   OT comments  Patient was agreeable to minimal movement with patient thinking today was Sunday at first reporting " I dont work on Sundays" patient was more agreeable when educated that today was Monday. Patient will need 24/7 caregiver support in next level of care. Current plan is for patient to transition home with [facility] services and family support. Patient would continue to benefit from skilled OT services at this time while admitted and after d/c to address noted deficits in order to improve overall safety and independence in ADLs.        If plan is discharge home, recommend the following:  Help with stairs or ramp for entrance;Assist for transportation;Assistance with cooking/housework;Direct supervision/assist for financial management;Direct supervision/assist for medications management;Supervision due to cognitive status;A little help with walking and/or transfers;A lot of help with bathing/dressing/bathroom   Equipment Recommendations  None recommended by OT       Precautions / Restrictions Precautions Precautions: Fall Restrictions Weight Bearing Restrictions Per Provider Order: No       Mobility Bed Mobility Overal bed mobility: Needs Assistance Bed Mobility: Supine to Sit, Sit to Supine     Supine to sit: Min assist Sit to supine: Contact guard assist, HOB elevated, Used rails   General bed mobility comments: Increased time. Cues provided as needed.         Balance Overall balance assessment: Needs assistance Sitting-balance support: No upper extremity supported, Feet supported Sitting balance-Leahy Scale: Good Sitting  balance - Comments: static sitting-good. dynamic sitting-fair+   Standing balance support: Bilateral upper extremity supported, During functional activity Standing balance-Leahy Scale: Poor         ADL either performed or assessed with clinical judgement   ADL Overall ADL's : Needs assistance/impaired                         Toilet Transfer: Minimal assistance Toilet Transfer Details (indicate cue type and reason): Patient declined to get to [facility] at this time reporting she did not need to go. patient was min A to take side steps up side of bed with increased time with RW with cues for encouargemnet.           General ADL Comments: patient agreeable to sit EOB with max A to scoot forwards. patient declining to get out of bed to chair.      Cognition Arousal: Alert Behavior During Therapy: WFL for tasks assessed/performed Overall Cognitive Status: History of cognitive impairments - at baseline             General Comments: oriented to self, plesant and cooperative                   Pertinent Vitals/ Pain       Pain Assessment Pain Assessment: No/denies pain Faces Pain Scale: No hurt         Frequency  Min 1X/week        Progress Toward Goals  OT Goals(current goals can now be found in the care plan section)  Progress towards OT goals: Progressing toward goals  ADL Goals Pt Will Perform Lower Body Dressing:  with supervision;sitting/lateral leans Pt Will Transfer to Toilet: with supervision;ambulating;regular height toilet Pt Will Perform Toileting - Clothing Manipulation and hygiene: with supervision;sit to/from stand  Plan         AM-PAC OT "6 Clicks" Daily Activity     Outcome Measure   Help from another person eating meals?: A Little Help from another person taking care of personal grooming?: A Little Help from another person toileting, which includes using toliet, bedpan, or urinal?: A Little Help from another person bathing  (including washing, rinsing, drying)?: A Lot Help from another person to put on and taking off regular upper body clothing?: A Little Help from another person to put on and taking off regular lower body clothing?: A Lot 6 Click Score: 16    End of Session Equipment Utilized During Treatment: Rolling walker (2 wheels)  OT Visit Diagnosis: Muscle weakness (generalized) (M62.81);Unsteadiness on feet (R26.81);Other symptoms and signs involving cognitive function   Activity Tolerance Patient tolerated treatment well   Patient Left with call bell/phone within reach;in bed;with bed alarm set   Nurse Communication Other (comment) (in room during movement)        Time: 6962-9528 OT Time Calculation (min): 18 min  Charges: OT General Charges $OT Visit: 1 Visit OT Treatments $Self Care/Home Management : 8-22 mins  Rosalio Loud, MS Acute Rehabilitation Department Office# [phone]   Selinda Flavin 04/03/2023, 5:05 PM

## 2023-04-04 DIAGNOSIS — J189 Pneumonia, unspecified organism: Secondary | ICD-10-CM | POA: Diagnosis not present

## 2023-04-04 LAB — CBC
HCT: 27.2 % — ABNORMAL LOW (ref 36.0–46.0)
Hemoglobin: 9 g/dL — ABNORMAL LOW (ref 12.0–15.0)
MCH: 25.4 pg — ABNORMAL LOW (ref 26.0–34.0)
MCHC: 33.1 g/dL (ref 30.0–36.0)
MCV: 76.6 fL — ABNORMAL LOW (ref 80.0–100.0)
Platelets: 351 10*3/uL (ref 150–400)
RBC: 3.55 MIL/uL — ABNORMAL LOW (ref 3.87–5.11)
RDW: 16.6 % — ABNORMAL HIGH (ref 11.5–15.5)
WBC: 5.8 10*3/uL (ref 4.0–10.5)
nRBC: 0.3 % — ABNORMAL HIGH (ref 0.0–0.2)

## 2023-04-04 LAB — BPAM RBC
Blood Product Expiration Date: 202501242359
ISSUE DATE / TIME: 202412301004
Unit Type and Rh: 7300

## 2023-04-04 LAB — TYPE AND SCREEN
ABO/RH(D): B POS
Antibody Screen: NEGATIVE
Unit division: 0

## 2023-04-04 MED ORDER — SODIUM CHLORIDE 0.9 % IV SOLN: INTRAVENOUS | Status: AC

## 2023-04-04 MED ORDER — PANTOPRAZOLE SODIUM 40 MG PO TBEC: 40.0000 mg | DELAYED_RELEASE_TABLET | Freq: Two times a day (BID) | ORAL | 0 refills | Status: DC

## 2023-04-04 MED ORDER — CEFDINIR 300 MG PO CAPS: 300.0000 mg | ORAL_CAPSULE | Freq: Two times a day (BID) | ORAL | 0 refills | Status: AC

## 2023-04-04 MED ORDER — FERROUS SULFATE 325 (65 FE) MG PO TBEC: 325.0000 mg | DELAYED_RELEASE_TABLET | Freq: Every day | ORAL | 3 refills | Status: AC

## 2023-04-04 MED ORDER — VITAMIN B-1 100 MG PO TABS: 100.0000 mg | ORAL_TABLET | Freq: Every day | ORAL | 0 refills | Status: DC

## 2023-04-04 MED ORDER — FOLIC ACID 1 MG PO TABS: 1.0000 mg | ORAL_TABLET | Freq: Every day | ORAL | 0 refills | Status: AC

## 2023-04-04 MED ORDER — VITAMIN B-12 1000 MCG PO TABS
1000.0000 ug | ORAL_TABLET | Freq: Every day | ORAL | Status: DC
  Administered 2023-04-04 – 2023-04-05 (×2): 1000 ug via ORAL
  Filled 2023-04-04 (×2): qty 1

## 2023-04-04 MED ORDER — SENNOSIDES-DOCUSATE SODIUM 8.6-50 MG PO TABS: 1.0000 | ORAL_TABLET | Freq: Two times a day (BID) | ORAL | 0 refills | Status: DC

## 2023-04-04 MED ORDER — CYANOCOBALAMIN 1000 MCG PO TABS: 1000.0000 ug | ORAL_TABLET | Freq: Every day | ORAL | 0 refills | Status: DC

## 2023-04-04 NOTE — Progress Notes (Signed)
 PROGRESS NOTE    Connie Snyder  FMW:[id number] DOB: 10-Apr-1927 DOA: 03/28/2023 PCP: Verdia Lombard, MD   Brief Narrative: 89 year old with past medical history significant for arthritis, pulmonary embolism on Xarelto,  Sjogren syndrome, Dementia,  presented with confusion and left arm pain.  She was also having dark tarry stool.  Patient was found to have pneumonia, chest x-ray showed right upper lobe airspace opacity, fever.   Assessment & Plan:   Principal Problem:   Community acquired pneumonia of right upper lobe of lung Active Problems:   Normocytic normochromic anemia   Malnutrition of moderate degree   Vascular dementia without behavioral disturbance (HCC)   Acute blood loss anemia   History of pulmonary embolism   1-Community-acquired pneumonia right upper lobe of the lung -Patient presented with shortness of breath, fever chest x-ray right upper lobe airspace opacity. -Completed  azithromycin  and Ceftriaxone  for 5 days. Will complete 7 days with omnicef  -Operatory panel positive for rhinovirus.  Supportive care Afebrile. Improving.   Elevation troponin -Likely secondary to demand ischemia in the setting of pneumonia -Troponin flat 12-38. -Echo: Ejection fraction 65 to 70%.  No regional wall motion abnormality.  Diastolic grade 1 dysfunction.  Right ventricular systolic function is normal.  Severely elevated pulmonary arterial pressure. Needs Out patient follow up./   Orthostatic Hypotension:  HB improved after blood transfusion.  Suspect related to hypovolemia poor oral intake.  Will cancel discharge, and will give IV fluids.   Acute on chronic normocytic anemia, ABLA likely secondary to upper GI bleed: -Baseline hemoglobin 10-12 presented with a hemoglobin of 8.- -GI consulted and recommended supportive care.  IV PPI -Plan is to resume anticoagulation in 48 hours and if no further  bleeding B12 deficiency; started  B12 supplement.  Resume Eliquis  12/28.  Hb down to 7.5 no BM documented. Continue to monitor.  Received IV iron  and started folic acid .  Received one unit PRBC 12/30 Hb increase to 9.   History of pulmonary embolism: 2022 Continue  Eliquis , on prophylaxis dose, due to unprovoked PE.     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   Weight as of this encounter: 52.4 kg.   DVT prophylaxis: SCD, no anticoagulation due to concern for GI bleed.  Code Status: Full code Family Communication: Son 12/30 Disposition Plan:  Status is: Inpatient Remains inpatient appropriate because: home tomorrow if hb increases.     Consultants:  none  Procedures:  none  Antimicrobials:    Subjective: She wake up , answer questions. Not eating much. No BM Objective: Vitals:   04/03/23 2140 04/04/23 0523 04/04/23 1321 04/04/23 1431  BP: 117/72 114/64 107/67 97/63  Pulse: 77 69 77   Resp: 19 16 20    Temp: 98.2 F (36.8 C) 98.4 F (36.9 C) 98.6 F (37 C)   TempSrc: Oral Oral Oral   SpO2: 100% 100% 99%   Weight:      Height:        Intake/Output Summary (Last 24 hours) at 04/04/2023 1441 Last data filed at 04/04/2023 1327 Gross per 24 hour  Intake 420 ml  Output 1950 ml  Net -  1530 ml   Filed Weights   03/28/23 2132  Weight: 52.4 kg    Examination:  General exam: NAD Respiratory system:  CTA Cardiovascular system: S 1, S 2 RRR Gastrointestinal system:BS present, soft, nt     Data Reviewed: I have personally reviewed following labs and imaging studies  CBC: Recent Labs  Lab 03/31/23 0443 04/01/23 1134 04/02/23 0445  04/03/23 0425 04/04/23 0428  WBC 7.3 4.9 5.1 4.6 5.8  HGB 7.8* 8.2* 7.5* 7.7* 9.0*  HCT 23.4* 23.9* 21.6* 23.5* 27.2*  MCV 74.8* 75.2* 74.0* 77.0* 76.6*  PLT 277 312 281 325 351   Basic Metabolic Panel: Recent Labs  Lab 03/28/23 2152 03/30/23 0501 03/31/23 0443 04/03/23 1706  NA 136 131* 132* 137  K 3.7 3.1* 3.6 3.5  CL 103 103 103 104  CO2 23 21* 22 25  GLUCOSE 105* 100* 103* 138*  BUN 22 20 19 17   CREATININE 0.63 0.57 0.72 0.63  CALCIUM 8.9 8.0* 8.1* 8.6*   GFR: Estimated Creatinine Clearance: 36.3 mL/min (by C-G formula based on SCr of 0.63 mg/dL). Liver Function Tests: No results for input(s): AST, ALT, ALKPHOS, BILITOT, PROT, ALBUMIN in the last 168 hours. No results for input(s): LIPASE, AMYLASE in the last 168 hours. No results for input(s): AMMONIA in the last 168 hours. Coagulation Profile: No results for input(s): INR, PROTIME in the last 168 hours. Cardiac Enzymes: No results for input(s): CKTOTAL, CKMB, CKMBINDEX, TROPONINI in the last 168 hours. BNP (last 3 results) No results for input(s): PROBNP in the last 8760 hours. HbA1C: No results for input(s): HGBA1C in the last 72 hours. CBG: Recent Labs  Lab 03/29/23 1720  GLUCAP 94   Lipid Profile: No results for input(s): CHOL, HDL, LDLCALC, TRIG, CHOLHDL, LDLDIRECT in the last 72 hours. Thyroid  Function Tests: Recent Labs    04/03/23 1706  TSH 3.724   Anemia Panel: No results for input(s): VITAMINB12, FOLATE, FERRITIN, TIBC, IRON , RETICCTPCT in the last 72 hours.  Sepsis Labs: Recent Labs  Lab 03/29/23 1318  PROCALCITON 0.53    Recent Results (from the past 240 hours)  Culture, blood (Routine X 2) w Reflex to ID Panel     Status: None   Collection Time: 03/29/23  1:18 PM   Specimen: BLOOD LEFT ARM  Result Value Ref Range Status   Specimen Description   Final    BLOOD LEFT ARM Performed at [facility], [address],  [place], [place] [address]    Special Requests   Final    BOTTLES DRAWN AEROBIC ONLY Blood Culture adequate volume Performed at [facility], [address] [place], [place] [address]    Culture   Final    NO GROWTH 5 DAYS Performed at [facility], [address], [place], [place] [address]    Report Status 04/03/2023 FINAL  Final  Culture, blood (Routine X 2) w Reflex to ID Panel     Status: None   Collection Time: 03/29/23  1:23 PM   Specimen: BLOOD LEFT HAND  Result Value Ref Range Status   Specimen Description   Final    BLOOD LEFT HAND Performed at [facility], [address] [place], [place] [address]    Special Requests   Final    BOTTLES DRAWN AEROBIC ONLY Blood Culture results may not be optimal due to an inadequate volume of blood received in culture bottles Performed at [facility], [address]  [place], [place] [address]    Culture   Final    NO GROWTH 5 DAYS Performed at [facility], [address] [place], [place] [address]    Report Status 04/03/2023 FINAL  Final  SARS Coronavirus 2 by RT PCR (hospital order, performed in [facility] hospital lab) *cepheid single result test* Anterior Nasal Swab     Status: None   Collection Time: 03/30/23  4:00 PM   Specimen: Anterior Nasal Swab  Result Value Ref Range Status   SARS Coronavirus 2 by RT PCR NEGATIVE NEGATIVE Final    Comment: (NOTE) SARS-CoV-2 target nucleic acids are NOT DETECTED.  The SARS-CoV-2 RNA is generally detectable in upper and lower respiratory specimens during the acute phase of infection. The lowest concentration of SARS-CoV-2 viral copies this assay can detect is 250 copies / mL. A negative result does not preclude SARS-CoV-2 infection and should not be used as the sole basis for treatment or other patient management decisions.  A negative result may occur with improper specimen collection / handling, submission of specimen other than  nasopharyngeal swab, presence of viral mutation(s) within the areas targeted by this assay, and inadequate number of viral copies (<250 copies / mL). A negative result must be combined with clinical observations, patient history, and epidemiological information.  Fact Sheet for Patients:   roadlaptop.co.za  Fact Sheet for Healthcare Providers: http://kim-miller.com/  This test is not yet approved or  cleared by the [place]  FDA and has been authorized for detection and/or diagnosis of SARS-CoV-2 by FDA under an Emergency Use Authorization (EUA).  This EUA will remain in effect (meaning this test can be used) for the duration of the COVID-19 declaration under Section 564(b)(1) of the Act, 21 U.S.C. section 360bbb-3(b)(1), unless the authorization is terminated or revoked sooner.  Performed at [facility], [address], [place], [place] [address]   Respiratory (~20 pathogens) panel by PCR     Status: Abnormal   Collection Time: 03/30/23  4:00 PM   Specimen: Nasopharyngeal Swab; Respiratory  Result Value Ref Range Status   Adenovirus NOT DETECTED NOT DETECTED Final   Coronavirus 229E NOT DETECTED NOT DETECTED Final    Comment: (NOTE) The Coronavirus on the Respiratory Panel, DOES NOT test for the novel  Coronavirus (2019 nCoV)    Coronavirus HKU1 NOT DETECTED NOT DETECTED Final   Coronavirus NL63 NOT DETECTED NOT DETECTED Final   Coronavirus OC43 NOT DETECTED NOT DETECTED Final   Metapneumovirus NOT DETECTED NOT DETECTED Final   Rhinovirus / Enterovirus DETECTED (A) NOT DETECTED Final   Influenza A NOT DETECTED NOT DETECTED Final   Influenza B NOT DETECTED NOT DETECTED Final   Parainfluenza Virus 1 NOT DETECTED NOT DETECTED Final   Parainfluenza Virus 2 NOT DETECTED NOT DETECTED Final   Parainfluenza Virus 3 NOT DETECTED NOT DETECTED Final   Parainfluenza Virus 4 NOT DETECTED NOT DETECTED Final    Respiratory Syncytial Virus NOT DETECTED NOT DETECTED Final   Bordetella pertussis NOT DETECTED NOT DETECTED Final   Bordetella Parapertussis NOT DETECTED NOT DETECTED Final   Chlamydophila pneumoniae NOT DETECTED NOT DETECTED Final   Mycoplasma pneumoniae NOT DETECTED NOT DETECTED Final    Comment: Performed at [facility], [address], [place], [place] [address]         Radiology Studies: No results found.       Scheduled Meds:  apixaban   2.5 mg Oral BID   cefdinir   300 mg Oral Q12H  vitamin B-12  1,000 mcg Oral Daily   feeding supplement  1 Container Oral BID BM   folic acid   1 mg Oral Daily   mirtazapine   15 mg Oral QHS   pantoprazole   40 mg Oral BID   senna-docusate  1 tablet Oral BID   thiamine   100 mg Oral Daily   Continuous Infusions:  sodium chloride  75 mL/hr at 04/04/23 1421      LOS: 6 days    Time spent: 35 minutes    Leaf K[employer] A Azara Gemme, MD [facility]   If 7PM-7AM, please contact night-coverage [email or web address]  04/04/2023, 2:41 PM

## 2023-04-04 NOTE — TOC Transition Note (Signed)
 Transition of Care [facility]) - Discharge Note   Patient Details  Name: Connie Snyder MRN: [id number] Date of Birth: 11-Apr-1927  Transition of Care [facility]) CM/SW Contact:  Bascom Service, RN Phone Number: 04/04/2023, 12:01 PM   Clinical Narrative:spoke to Marcus(son) about d/c plans-agee to enhabit rep amy for HHPT/OT/aide/sw;dme no preference-[employer] 3n1,ordered for shower stoll but not covered if has 3n1. Jerona will be informed of 3n1 to be provided. Family has own transport home.       Final next level of care: Home w Home Health Services Barriers to Discharge: No Barriers Identified   Patient Goals and CMS Choice Patient states their goals for this hospitalization and ongoing recovery are:: Home CMS Medicare.gov Compare Post Acute Care list provided to:: Patient Represenative (must comment)        Discharge Placement                       Discharge Plan and Services Additional resources added to the After Visit Summary for     Discharge Planning Services: CM Consult Post Acute Care Choice: Home Health          DME Arranged: 3-N-1 DME Agency: [employer] Date DME Agency Contacted: 04/04/23 Time DME Agency Contacted: 1201 Representative spoke with at DME Agency: [place] HH Arranged: PT, OT, Nurse's Aide, Social Work [facility] Agency: [facility] Date [facility] Agency Contacted: 04/04/23 Time HH Agency Contacted: 1201 Representative spoke with at [facility] Agency: [facility]  Social Drivers of Health (SDOH) Interventions SDOH Screenings   Food Insecurity: Patient Unable To Answer (03/30/2023)  Housing: Patient Unable To Answer (03/30/2023)  Transportation Needs: Patient Unable To Answer (03/30/2023)  Utilities: Patient Unable To Answer (03/30/2023)  Social Connections: Unknown (04/03/2023)  Tobacco Use: Medium Risk (03/28/2023)     Readmission Risk Interventions     No data to display

## 2023-04-04 NOTE — Plan of Care (Signed)
  Problem: Nutrition: Goal: Adequate nutrition will be maintained Outcome: Progressing   Problem: Coping: Goal: Level of anxiety will decrease Outcome: Progressing   Problem: Elimination: Goal: Will not experience complications related to bowel motility Outcome: Progressing Goal: Will not experience complications related to urinary retention Outcome: Progressing   Problem: Pain Management: Goal: General experience of comfort will improve Outcome: Progressing   Problem: Safety: Goal: Ability to remain free from injury will improve Outcome: Progressing   Problem: Skin Integrity: Goal: Risk for impaired skin integrity will decrease Outcome: Progressing

## 2023-04-04 NOTE — Progress Notes (Signed)
 PT Cancellation Note  Patient Details Name: CLELA HAGADORN MRN: [id number] DOB: 1927-08-19   Cancelled Treatment:    Reason Eval/Treat Not Completed: Medical issues which prohibited therapy (per son pt was lightheaded and orthostatic earlier today, DC was cancelled, pt is receiving fluids. Will check back tomorrow.)   Sylvan Delon Copp PT 04/04/2023  Acute Rehabilitation Services  Office [phone]

## 2023-04-04 NOTE — Evaluation (Signed)
 SLP Cancellation Note  Patient Details Name: Connie Snyder MRN: [id number] DOB: 11/08/1927   Cancelled treatment:       Reason Eval/Treat Not Completed: Other (comment) (SLP attempted to follow up earlier; RN was prepping to give patient a bath as she is to dc home today; will continue efforts)   Madelin POUR, MS [facility] SLP Acute Rehab Services Office [phone]  Nicolas Emmie Caldron 04/04/2023, 3:16 PM

## 2023-04-04 NOTE — Progress Notes (Signed)
 Attempted to get patient up to bathroom for a shower per family request prior to discharge. Upon sitting on the side of the bed patient complained of dizziness and stated the room was spinning. BP checked sitting, 103/77, assisted patient to stand, BP checked standing 88/61. Assisted patient back to bed. Notified MD, discharge order d/ced. Family notified. New orders placed for IV fluids.

## 2023-04-05 DIAGNOSIS — J189 Pneumonia, unspecified organism: Secondary | ICD-10-CM | POA: Diagnosis not present

## 2023-04-05 LAB — CBC
HCT: 26.1 % — ABNORMAL LOW (ref 36.0–46.0)
Hemoglobin: 8.6 g/dL — ABNORMAL LOW (ref 12.0–15.0)
MCH: 25.9 pg — ABNORMAL LOW (ref 26.0–34.0)
MCHC: 33 g/dL (ref 30.0–36.0)
MCV: 78.6 fL — ABNORMAL LOW (ref 80.0–100.0)
Platelets: 327 10*3/uL (ref 150–400)
RBC: 3.32 MIL/uL — ABNORMAL LOW (ref 3.87–5.11)
RDW: 17.6 % — ABNORMAL HIGH (ref 11.5–15.5)
WBC: 5.2 10*3/uL (ref 4.0–10.5)
nRBC: 0 % (ref 0.0–0.2)

## 2023-04-05 LAB — CORTISOL: Cortisol, Plasma: 4.9 ug/dL

## 2023-04-05 NOTE — Plan of Care (Signed)

## 2023-04-05 NOTE — Plan of Care (Signed)
  Problem: Education: Goal: Knowledge of General Education information will improve Description: Including pain rating scale, medication(s)/side effects and non-pharmacologic comfort measures 04/05/2023 1224 by Rosanne Elspeth HERO, RN Outcome: Adequate for Discharge 04/05/2023 1032 by Rosanne Elspeth HERO, RN Outcome: Progressing   Problem: Health Behavior/Discharge Planning: Goal: Ability to manage health-related needs will improve 04/05/2023 1224 by Rosanne Elspeth HERO, RN Outcome: Adequate for Discharge 04/05/2023 1032 by Rosanne Elspeth HERO, RN Outcome: Progressing   Problem: Clinical Measurements: Goal: Ability to maintain clinical measurements within normal limits will improve 04/05/2023 1224 by Rosanne Elspeth HERO, RN Outcome: Adequate for Discharge 04/05/2023 1032 by Rosanne Elspeth HERO, RN Outcome: Progressing Goal: Will remain free from infection 04/05/2023 1224 by Rosanne Elspeth HERO, RN Outcome: Adequate for Discharge 04/05/2023 1032 by Rosanne Elspeth HERO, RN Outcome: Progressing Goal: Diagnostic test results will improve 04/05/2023 1224 by Rosanne Elspeth HERO, RN Outcome: Adequate for Discharge 04/05/2023 1032 by Rosanne Elspeth HERO, RN Outcome: Progressing Goal: Respiratory complications will improve 04/05/2023 1224 by Rosanne Elspeth HERO, RN Outcome: Adequate for Discharge 04/05/2023 1032 by Rosanne Elspeth HERO, RN Outcome: Progressing Goal: Cardiovascular complication will be avoided 04/05/2023 1224 by Rosanne Elspeth HERO, RN Outcome: Adequate for Discharge 04/05/2023 1032 by Rosanne Elspeth HERO, RN Outcome: Progressing   Problem: Activity: Goal: Risk for activity intolerance will decrease 04/05/2023 1224 by Rosanne Elspeth HERO, RN Outcome: Adequate for Discharge 04/05/2023 1032 by Rosanne Elspeth HERO, RN Outcome: Progressing   Problem: Nutrition: Goal: Adequate nutrition will be maintained 04/05/2023 1224 by Rosanne Elspeth HERO, RN Outcome: Adequate for  Discharge 04/05/2023 1032 by Rosanne Elspeth HERO, RN Outcome: Progressing   Problem: Coping: Goal: Level of anxiety will decrease 04/05/2023 1224 by Rosanne Elspeth HERO, RN Outcome: Adequate for Discharge 04/05/2023 1032 by Rosanne Elspeth HERO, RN Outcome: Progressing   Problem: Elimination: Goal: Will not experience complications related to bowel motility 04/05/2023 1224 by Rosanne Elspeth HERO, RN Outcome: Adequate for Discharge 04/05/2023 1032 by Rosanne Elspeth HERO, RN Outcome: Progressing Goal: Will not experience complications related to urinary retention 04/05/2023 1224 by Rosanne Elspeth HERO, RN Outcome: Adequate for Discharge 04/05/2023 1032 by Rosanne Elspeth HERO, RN Outcome: Progressing   Problem: Pain Management: Goal: General experience of comfort will improve 04/05/2023 1224 by Rosanne Elspeth HERO, RN Outcome: Adequate for Discharge 04/05/2023 1032 by Rosanne Elspeth HERO, RN Outcome: Progressing   Problem: Safety: Goal: Ability to remain free from injury will improve 04/05/2023 1224 by Rosanne Elspeth HERO, RN Outcome: Adequate for Discharge 04/05/2023 1032 by Rosanne Elspeth HERO, RN Outcome: Progressing   Problem: Skin Integrity: Goal: Risk for impaired skin integrity will decrease 04/05/2023 1224 by Rosanne Elspeth HERO, RN Outcome: Adequate for Discharge 04/05/2023 1032 by Rosanne Elspeth HERO, RN Outcome: Progressing

## 2023-04-05 NOTE — Plan of Care (Signed)
 Problem: Education: Goal: Knowledge of General Education information will improve Description: Including pain rating scale, medication(s)/side effects and non-pharmacologic comfort measures 04/05/2023 1748 by Rosanne Elspeth HERO, RN Outcome: Adequate for Discharge 04/05/2023 1224 by Rosanne Elspeth HERO, RN Outcome: Adequate for Discharge 04/05/2023 1032 by Rosanne Elspeth HERO, RN Outcome: Progressing   Problem: Health Behavior/Discharge Planning: Goal: Ability to manage health-related needs will improve 04/05/2023 1748 by Rosanne Elspeth HERO, RN Outcome: Adequate for Discharge 04/05/2023 1224 by Rosanne Elspeth HERO, RN Outcome: Adequate for Discharge 04/05/2023 1032 by Rosanne Elspeth HERO, RN Outcome: Progressing   Problem: Clinical Measurements: Goal: Ability to maintain clinical measurements within normal limits will improve 04/05/2023 1748 by Rosanne Elspeth HERO, RN Outcome: Adequate for Discharge 04/05/2023 1224 by Rosanne Elspeth HERO, RN Outcome: Adequate for Discharge 04/05/2023 1032 by Rosanne Elspeth HERO, RN Outcome: Progressing Goal: Will remain free from infection 04/05/2023 1748 by Rosanne Elspeth HERO, RN Outcome: Adequate for Discharge 04/05/2023 1224 by Rosanne Elspeth HERO, RN Outcome: Adequate for Discharge 04/05/2023 1032 by Rosanne Elspeth HERO, RN Outcome: Progressing Goal: Diagnostic test results will improve 04/05/2023 1748 by Rosanne Elspeth HERO, RN Outcome: Adequate for Discharge 04/05/2023 1224 by Rosanne Elspeth HERO, RN Outcome: Adequate for Discharge 04/05/2023 1032 by Rosanne Elspeth HERO, RN Outcome: Progressing Goal: Respiratory complications will improve 04/05/2023 1748 by Rosanne Elspeth HERO, RN Outcome: Adequate for Discharge 04/05/2023 1224 by Rosanne Elspeth HERO, RN Outcome: Adequate for Discharge 04/05/2023 1032 by Rosanne Elspeth HERO, RN Outcome: Progressing Goal: Cardiovascular complication will be avoided 04/05/2023 1748 by Rosanne Elspeth HERO,  RN Outcome: Adequate for Discharge 04/05/2023 1224 by Rosanne Elspeth HERO, RN Outcome: Adequate for Discharge 04/05/2023 1032 by Rosanne Elspeth HERO, RN Outcome: Progressing   Problem: Activity: Goal: Risk for activity intolerance will decrease 04/05/2023 1748 by Rosanne Elspeth HERO, RN Outcome: Adequate for Discharge 04/05/2023 1224 by Rosanne Elspeth HERO, RN Outcome: Adequate for Discharge 04/05/2023 1032 by Rosanne Elspeth HERO, RN Outcome: Progressing   Problem: Nutrition: Goal: Adequate nutrition will be maintained 04/05/2023 1748 by Rosanne Elspeth HERO, RN Outcome: Adequate for Discharge 04/05/2023 1224 by Rosanne Elspeth HERO, RN Outcome: Adequate for Discharge 04/05/2023 1032 by Rosanne Elspeth HERO, RN Outcome: Progressing   Problem: Coping: Goal: Level of anxiety will decrease 04/05/2023 1748 by Rosanne Elspeth HERO, RN Outcome: Adequate for Discharge 04/05/2023 1224 by Rosanne Elspeth HERO, RN Outcome: Adequate for Discharge 04/05/2023 1032 by Rosanne Elspeth HERO, RN Outcome: Progressing   Problem: Elimination: Goal: Will not experience complications related to bowel motility 04/05/2023 1748 by Rosanne Elspeth HERO, RN Outcome: Adequate for Discharge 04/05/2023 1224 by Rosanne Elspeth HERO, RN Outcome: Adequate for Discharge 04/05/2023 1032 by Rosanne Elspeth HERO, RN Outcome: Progressing Goal: Will not experience complications related to urinary retention 04/05/2023 1748 by Rosanne Elspeth HERO, RN Outcome: Adequate for Discharge 04/05/2023 1224 by Rosanne Elspeth HERO, RN Outcome: Adequate for Discharge 04/05/2023 1032 by Rosanne Elspeth HERO, RN Outcome: Progressing   Problem: Pain Management: Goal: General experience of comfort will improve 04/05/2023 1748 by Rosanne Elspeth HERO, RN Outcome: Adequate for Discharge 04/05/2023 1224 by Rosanne Elspeth HERO, RN Outcome: Adequate for Discharge 04/05/2023 1032 by Rosanne Elspeth HERO, RN Outcome: Progressing   Problem:  Safety: Goal: Ability to remain free from injury will improve 04/05/2023 1748 by Rosanne Elspeth HERO, RN Outcome: Adequate for Discharge 04/05/2023 1224 by Rosanne Elspeth HERO, RN Outcome: Adequate for Discharge 04/05/2023 1032 by Rosanne Elspeth HERO, RN Outcome: Progressing   Problem: Skin Integrity: Goal: Risk for impaired skin  integrity will decrease 04/05/2023 1748 by Rosanne Elspeth HERO, RN Outcome: Adequate for Discharge 04/05/2023 1224 by Rosanne Elspeth HERO, RN Outcome: Adequate for Discharge 04/05/2023 1032 by Rosanne Elspeth HERO, RN Outcome: Progressing

## 2023-04-05 NOTE — Discharge Summary (Signed)
 Physician Discharge Summary   Patient: Connie Snyder MRN: [id number] DOB: 10/10/1927  Admit date:     03/28/2023  Discharge date: 04/05/23  Discharge Physician: Connie Snyder   PCP: Connie Lombard, MD   Recommendations at discharge:    Follow up with cardiology for pulmonary HTN Follow up with PCP for further goals of care, discussion Code status.  Needs CBC to monitor Hb.  Follow up for resolution of PNA  Discharge Diagnoses: Principal Problem:   Community acquired pneumonia of right upper lobe of lung Active Problems:   Normocytic normochromic anemia   Malnutrition of moderate degree   Vascular dementia without behavioral disturbance (HCC)   Acute blood loss anemia   History of pulmonary embolism  Resolved Problems:   * No resolved hospital problems. *  Hospital Course: 89 year old with past medical history significant for arthritis, pulmonary embolism on Xarelto,  Sjogren syndrome, Dementia,  presented with confusion and left arm pain.  She was also having dark tarry stool.   Patient was found to have pneumonia, chest x-ray showed right upper lobe airspace opacity, fever. Treated for PNA> had Orthostatic Hypotension in setting of Dehydration, improved with IV fluids.     Assessment and Plan:   1-Community-acquired pneumonia right upper lobe of the lung -Patient presented with shortness of breath, fever chest x-ray right upper lobe airspace opacity. -Completed  azithromycin  and Ceftriaxone  for 5 days. Will complete 7 days with omnicef  -Operatory panel positive for rhinovirus.  Supportive care Afebrile. Improving.    Elevation troponin -Likely secondary to demand ischemia in the setting of pneumonia -Troponin flat 12-38. -Echo: Ejection fraction 65 to 70%.  No regional wall motion abnormality.  Diastolic grade 1 dysfunction.  Right ventricular systolic function is normal.  Severely elevated pulmonary arterial pressure. Needs Out patient follow up./     Orthostatic Hypotension:  HB improved after blood transfusion.  Suspect related to hypovolemia poor oral intake.  Resolved with Fluids.     Acute on chronic normocytic anemia, ABLA likely secondary to upper GI bleed: -Baseline hemoglobin 10-12 presented with a hemoglobin of 8.- -GI consulted and recommended supportive care.  IV PPI -Plan is to resume anticoagulation in 48 hours and if no further  bleeding B12 deficiency; started  B12 supplement.  Resume Eliquis  12/28. Hb down to 7.5 no BM documented. Continue to monitor.  Received IV iron  and started folic acid .  Received one unit PRBC 12/30 Hb increase to 9 post transfusion. Hb 8.6    History of pulmonary embolism: 2022 Continue  Eliquis , on prophylaxis dose, due to unprovoked PE.        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  Pain management    Vascular  dementia without behavioral disturbance: -Continue with Remeron    Underweight: ensure          Consultants: None Procedures performed: None Disposition: Home Diet recommendation:  Discharge Diet Orders (From admission, onward)     Start     Ordered   04/05/23 0000  Diet general        04/05/23 1126   04/04/23 0000  Diet - low sodium heart healthy        04/04/23 1154           Regular diet DISCHARGE MEDICATION: Allergies as of 04/05/2023       Reactions   Aricept  [donepezil ] Nausea And Vomiting  Medication List     STOP taking these medications    aspirin  EC 325 MG tablet   furosemide  20 MG tablet Commonly known as: LASIX    hydrOXYzine  25 MG tablet Commonly known as: ATARAX    potassium chloride  10 MEQ tablet Commonly known as: KLOR-CON        TAKE these medications    cefdinir  300 MG capsule Commonly known as: OMNICEF  Take 1 capsule (300 mg total) by mouth every 12  (twelve) hours for 1 day.   cyanocobalamin  1000 MCG tablet Take 1 tablet (1,000 mcg total) by mouth daily. What changed:  medication strength how much to take   Eliquis  2.5 MG Tabs tablet Generic drug: apixaban  Take 2.5 mg by mouth 2 (two) times daily.   feeding supplement Liqd Take 1 Container by mouth daily.   ferrous sulfate  325 (65 FE) MG EC tablet Take 1 tablet (325 mg total) by mouth daily with breakfast.   folic acid  1 MG tablet Commonly known as: FOLVITE  Take 1 tablet (1 mg total) by mouth daily.   mirtazapine  15 MG tablet Commonly known as: REMERON  TAKE 1 TABLET BY MOUTH EVERY DAY AT BEDTIME FOR 90 DAYS for 90   Multivitamin Women 50+ Tabs Take 1 tablet by mouth daily with breakfast.   pantoprazole  40 MG tablet Commonly known as: PROTONIX  Take 1 tablet (40 mg total) by mouth 2 (two) times daily.   senna-docusate 8.6-50 MG tablet Commonly known as: Senokot-S Take 1 tablet by mouth 2 (two) times daily.   thiamine  100 MG tablet Commonly known as: Vitamin B-1 Take 1 tablet (100 mg total) by mouth daily.   ZINC PO Take 1 tablet by mouth daily.               Durable Medical Equipment  (From admission, onward)           Start     Ordered   04/04/23 1146  For home use only DME Shower stool  Once        04/04/23 1146   04/04/23 1146  For home use only DME Bedside commode  Once       Question:  Patient needs a bedside commode to treat with the following condition  Answer:  Weakness of both legs   04/04/23 1146            Follow-up Information     [facility] Follow up.   Why: HH physical/occupational therapy,aide,social work Solicitor information: [address] [place] [address] [phone]         Connie Lombard, MD Follow up in 1 week(s).   Specialty: Internal Medicine Contact information: [address] [place] [address] [place] [address] [phone]         [facility] Follow up.   Why:  bedside commode Contact information: [address] [facility]               Discharge Exam: Connie Snyder   03/28/23 2132  Weight: 52.4 kg   General; NAD  Condition at discharge: stable  The results of significant diagnostics from this hospitalization (including imaging, microbiology, ancillary and laboratory) are listed below for reference.   Imaging Studies: ECHOCARDIOGRAM COMPLETE Result Date: 03/29/2023    ECHOCARDIOGRAM REPORT   Patient Name:   Connie Snyder [facility] Date of Exam: 03/29/2023 Medical Rec #:  [id number]       Height:       64.0 in Accession #:    [id number]  Weight:       115.6 lb Date of Birth:  May 30, 1927      BSA:          1.550 m Patient Age:    89 years        BP:           153/96 mmHg Patient Gender: F               HR:           78 bpm. Exam Location:  Inpatient Procedure: 2D Echo, Cardiac Doppler and Color Doppler Indications:     Chest Pain R07.9  History:         Patient has prior history of Echocardiogram examinations, most                  recent 04/19/2022. Risk Factors:Former Smoker. History of                  pulmonary embolism, Altered mental status/Confusion.  Sonographer:     Aida Pizza RCS Referring Phys:  [id number] LAMAR DESS Diagnosing Phys: Toribio Fuel MD  Sonographer Comments: Somewhat difficult exam due to patient confusion at times. IMPRESSIONS  1. Left ventricular ejection fraction, by estimation, is 65 to 70%. The left ventricle has normal function. The left ventricle has no regional wall motion abnormalities. There is mild concentric left ventricular hypertrophy. Left ventricular diastolic parameters are consistent with Grade I diastolic dysfunction (impaired relaxation).  2. Right ventricular systolic function is normal. The right ventricular size is normal. There is severely elevated pulmonary artery systolic pressure. The estimated right ventricular systolic pressure is 70.7 mmHg.  3. Left atrial size was mild to moderately dilated.   4. Right atrial size was moderately dilated.  5. The mitral valve is degenerative. Mild mitral valve regurgitation. No evidence of mitral stenosis.  6. The aortic valve is tricuspid. There is mild calcification of the aortic valve. Aortic valve regurgitation is trivial. Mild aortic valve stenosis. Aortic valve area, by VTI measures 1.23 cm. Aortic valve mean gradient measures 14.0 mmHg. Aortic valve Vmax measures 2.51 m/s.  7. The inferior vena cava is dilated in size with <50% respiratory variability, suggesting right atrial pressure of 15 mmHg. FINDINGS  Left Ventricle: Left ventricular ejection fraction, by estimation, is 65 to 70%. The left ventricle has normal function. The left ventricle has no regional wall motion abnormalities. The left ventricular internal cavity size was normal in size. There is  mild concentric left ventricular hypertrophy. Left ventricular diastolic parameters are consistent with Grade I diastolic dysfunction (impaired relaxation). Right Ventricle: The right ventricular size is normal. No increase in right ventricular wall thickness. Right ventricular systolic function is normal. There is severely elevated pulmonary artery systolic pressure. The tricuspid regurgitant velocity is 3.73 m/s, and with an assumed right atrial pressure of 15 mmHg, the estimated right ventricular systolic pressure is 70.7 mmHg. Left Atrium: Left atrial size was mild to moderately dilated. Right Atrium: Right atrial size was moderately dilated. Pericardium: There is no evidence of pericardial effusion. Mitral Valve: The mitral valve is degenerative in appearance. There is mild calcification of the mitral valve leaflet(s). Mild mitral annular calcification. Mild mitral valve regurgitation. No evidence of mitral valve stenosis. Tricuspid Valve: The tricuspid valve is normal in structure. Tricuspid valve regurgitation is mild . No evidence of tricuspid stenosis. Aortic Valve: The aortic valve is tricuspid. There  is mild calcification of the aortic valve. Aortic valve regurgitation is trivial. Mild aortic  stenosis is present. Aortic valve mean gradient measures 14.0 mmHg. Aortic valve peak gradient measures 25.2 mmHg. Aortic valve area, by VTI measures 1.23 cm. Pulmonic Valve: The pulmonic valve was not well visualized. Pulmonic valve regurgitation is not visualized. No evidence of pulmonic stenosis. Aorta: The aortic root is normal in size and structure. Venous: The inferior vena cava is dilated in size with less than 50% respiratory variability, suggesting right atrial pressure of 15 mmHg. IAS/Shunts: No atrial level shunt detected by color flow Doppler.  LEFT VENTRICLE PLAX 2D LVIDd:         3.40 cm   Diastology LVIDs:         2.40 cm   LV e' medial:    4.57 cm/s LV PW:         1.00 cm   LV E/e' medial:  21.3 LV IVS:        1.10 cm   LV e' lateral:   5.44 cm/s LVOT diam:     1.80 cm   LV E/e' lateral: 17.9 LV SV:         60 LV SV Index:   38 LVOT Area:     2.54 cm  RIGHT VENTRICLE RV S prime:     13.70 cm/s TAPSE (M-mode): 2.4 cm LEFT ATRIUM             Index        RIGHT ATRIUM           Index LA diam:        3.10 cm 2.00 cm/m   RA Area:     19.90 cm LA Vol (A2C):   44.7 ml 28.84 ml/m  RA Volume:   55.00 ml  35.49 ml/m LA Vol (A4C):   53.9 ml 34.78 ml/m LA Biplane Vol: 48.7 ml 31.42 ml/m  AORTIC VALVE AV Area (Vmax):    1.50 cm AV Area (Vmean):   1.26 cm AV Area (VTI):     1.23 cm AV Vmax:           251.00 cm/s AV Vmean:          171.000 cm/s AV VTI:            0.485 m AV Peak Grad:      25.2 mmHg AV Mean Grad:      14.0 mmHg LVOT Vmax:         148.00 cm/s LVOT Vmean:        84.600 cm/s LVOT VTI:          0.234 m LVOT/AV VTI ratio: 0.48  AORTA Ao Root diam: 3.20 cm MITRAL VALVE                TRICUSPID VALVE MV Area (PHT): 2.01 cm     TR Peak grad:   55.7 mmHg MV Decel Time: 377 msec     TR Vmax:        373.00 cm/s MV E velocity: 97.40 cm/s MV A velocity: 141.00 cm/s  SHUNTS MV E/A ratio:  0.69          Systemic VTI:  0.23 m                             Systemic Diam: 1.80 cm Toribio Fuel MD Electronically signed by Toribio Fuel MD Signature Date/Time: 03/29/2023/1:30:30 PM    Final (Updated)    DG Shoulder 1V Left Result Date: 03/29/2023 CLINICAL DATA:  [id number]  with bilateral shoulder pain, question recent injury. Patient has dementia and cannot provide reliable history. EXAM: LEFT SHOULDER; RIGHT SHOULDER - 1 VIEW COMPARISON:  No prior right shoulder series. Comparison made with the left shoulder series dated 08/15/2019. FINDINGS: Right shoulder, single AP internal rotation view: There are short linear calcifications alongside the greater tuberosity which could be due to rotator cuff calcific tendinopathy or tiny chip fractures. There is osteopenia without further single-view evidence of fractures. No dislocation is seen.  There are mild spurs at the [facility] joint. There are healed fracture deformities of several right-sided ribs. No infiltrate in the visualized right lung. Perihilar linear atelectasis or scarring. Left shoulder, single AP view: Again noted is a chronic proximal left humeral shaft fracture with an atrophic nonunion and scattered bony debris between the main fracture fragments. The distal fragment again is displaced medially by greater than 1 bone width and laterally angulated, on the prior study was also anteriorly displaced and mildly anteriorly angulated. Osteopenia is present without evidence of new fracture on this single view. There is slight spurring of the [facility] joint. No infiltrate in the peripheral left lung. IMPRESSION: 1. Short linear calcifications alongside the right greater tuberosity which could be due to rotator cuff calcific tendinopathy or tiny chip fractures. 2. Osteopenia without further single-view evidence of fractures. 3. 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 The distal fragment  again is displaced medially by greater than 1 bone width and laterally angulated, on the prior study was also anteriorly displaced and mildly anteriorly angulated. 4. Mild degenerative changes of the AC joints bilaterally. Electronically Signed   By: Francis Quam M.D.   On: 03/29/2023 06:05   DG Shoulder 1V Right Result Date: 03/29/2023 CLINICAL DATA:  [id number] with bilateral shoulder pain, question recent injury. Patient has dementia and cannot provide reliable history. EXAM: LEFT SHOULDER; RIGHT SHOULDER - 1 VIEW COMPARISON:  No prior right shoulder series. Comparison made with the left shoulder series dated 08/15/2019. FINDINGS: Right shoulder, single AP internal rotation view: There are short linear calcifications alongside the greater tuberosity which could be due to rotator cuff calcific tendinopathy or tiny chip fractures. There is osteopenia without further single-view evidence of fractures. No dislocation is seen.  There are mild spurs at the [facility] joint. There are healed fracture deformities of several right-sided ribs. No infiltrate in the visualized right lung. Perihilar linear atelectasis or scarring. Left shoulder, single AP view: Again noted is a chronic proximal left humeral shaft fracture with an atrophic nonunion and scattered bony debris between the main fracture fragments. The distal fragment again is displaced medially by greater than 1 bone width and laterally angulated, on the prior study was also anteriorly displaced and mildly anteriorly angulated. Osteopenia is present without evidence of new fracture on this single view. There is slight spurring of the [facility] joint. No infiltrate in the peripheral left lung. IMPRESSION: 1. Short linear calcifications alongside the right greater tuberosity which could be due to rotator cuff calcific tendinopathy or tiny chip fractures. 2. Osteopenia without further single-view evidence of fractures. 3. Chronic proximal left humeral shaft fracture with atrophic  nonunion and scattered bony debris between the main fracture fragments. The distal fragment again is displaced medially by greater than 1 bone width and laterally angulated, on the prior study was also anteriorly displaced and mildly anteriorly angulated. 4. Mild degenerative changes of the AC joints bilaterally. Electronically Signed   By: Francis  Chesser M.D.   On: 03/29/2023 06:05   DG Chest Portable 1 View Result Date: 03/28/2023 CLINICAL DATA:  Left arm pain EXAM: PORTABLE CHEST 1 VIEW COMPARISON:  04/25/2022 FINDINGS: Heart and mediastinal contours within normal limits. Left lung clear. Airspace opacity throughout the right upper lobe. No effusions. No acute bony abnormality IMPRESSION: Airspace opacity throughout the right upper lobe concerning for pneumonia. Electronically Signed   By: Franky Crease M.D.   On: 03/28/2023 22:38    Microbiology: Results for orders placed or performed during the hospital encounter of 03/28/23  Culture, blood (Routine X 2) w Reflex to ID Panel     Status: None   Collection Time: 03/29/23  1:18 PM   Specimen: BLOOD LEFT ARM  Result Value Ref Range Status   Specimen Description   Final    BLOOD LEFT ARM Performed at [facility], [address], [place], [place] [address]    Special Requests   Final    BOTTLES DRAWN AEROBIC ONLY Blood Culture adequate volume Performed at [facility], [address], [place], [place] [address]    Culture   Final    NO GROWTH 5 DAYS Performed at [facility], [address], [place], [place] [address]    Report Status 04/03/2023 FINAL  Final  Culture, blood (Routine X 2) w Reflex to ID Panel     Status: None   Collection Time: 03/29/23  1:23 PM   Specimen: BLOOD LEFT HAND  Result Value Ref Range Status   Specimen Description   Final    BLOOD LEFT HAND Performed at [facility], [address] [place], [place] [address]    Special Requests   Final    BOTTLES DRAWN AEROBIC ONLY  Blood Culture results may not be optimal due to an inadequate volume of blood received in culture bottles Performed at [facility], [address] [place], [place] [address]    Culture   Final    NO GROWTH 5 DAYS Performed at [facility], [address] [place], [place] [address]    Report Status 04/03/2023 FINAL  Final  SARS Coronavirus 2 by RT PCR (hospital order, performed in [facility] hospital lab) *cepheid single result test* Anterior Nasal Swab     Status: None   Collection Time: 03/30/23  4:00 PM   Specimen: Anterior Nasal Swab  Result Value Ref Range Status   SARS Coronavirus 2 by RT PCR NEGATIVE NEGATIVE Final    Comment: (NOTE) SARS-CoV-2 target nucleic acids are NOT DETECTED.  The SARS-CoV-2 RNA is generally detectable in upper and lower respiratory specimens during the acute phase of infection. The lowest concentration of SARS-CoV-2 viral copies this assay can detect is 250 copies / mL. A negative result does not preclude SARS-CoV-2 infection and should not be used as the sole basis for treatment or other patient management decisions.  A negative result may occur with improper specimen collection / handling, submission of specimen other than nasopharyngeal swab, presence of viral mutation(s) within the areas targeted by this assay, and inadequate number of viral copies (<250 copies / mL). A negative result must be combined with clinical observations, patient history, and epidemiological information.  Fact Sheet for Patients:   roadlaptop.co.za  Fact Sheet for Healthcare Providers: http://kim-miller.com/  This test is not yet approved or  cleared by the [place]  FDA and has been authorized for detection and/or diagnosis of SARS-CoV-2 by FDA under  an Emergency Use Authorization (EUA).  This EUA will remain in effect (meaning this test can be used) for the duration of the COVID-19  declaration under Section 564(b)(1) of the Act, 21 U.S.C. section 360bbb-3(b)(1), unless the authorization is terminated or revoked sooner.  Performed at [facility], [address], [place], [place] [address]   Respiratory (~20 pathogens) panel by PCR     Status: Abnormal   Collection Time: 03/30/23  4:00 PM   Specimen: Nasopharyngeal Swab; Respiratory  Result Value Ref Range Status   Adenovirus NOT DETECTED NOT DETECTED Final   Coronavirus 229E NOT DETECTED NOT DETECTED Final    Comment: (NOTE) The Coronavirus on the Respiratory Panel, DOES NOT test for the novel  Coronavirus (2019 nCoV)    Coronavirus HKU1 NOT DETECTED NOT DETECTED Final   Coronavirus NL63 NOT DETECTED NOT DETECTED Final   Coronavirus OC43 NOT DETECTED NOT DETECTED Final   Metapneumovirus NOT DETECTED NOT DETECTED Final   Rhinovirus / Enterovirus DETECTED (A) NOT DETECTED Final   Influenza A NOT DETECTED NOT DETECTED Final   Influenza B NOT DETECTED NOT DETECTED Final   Parainfluenza Virus 1 NOT DETECTED NOT DETECTED Final   Parainfluenza Virus 2 NOT DETECTED NOT DETECTED Final   Parainfluenza Virus 3 NOT DETECTED NOT DETECTED Final   Parainfluenza Virus 4 NOT DETECTED NOT DETECTED Final   Respiratory Syncytial Virus NOT DETECTED NOT DETECTED Final   Bordetella pertussis NOT DETECTED NOT DETECTED Final   Bordetella Parapertussis NOT DETECTED NOT DETECTED Final   Chlamydophila pneumoniae NOT DETECTED NOT DETECTED Final   Mycoplasma pneumoniae NOT DETECTED NOT DETECTED Final    Comment: Performed at [facility], [address] [place], [place] [address]    Labs: CBC: Recent Labs  Lab 04/01/23 1134 04/02/23 0445 04/03/23 0425 04/04/23 0428 04/05/23 0514  WBC 4.9 5.1 4.6 5.8 5.2  HGB 8.2* 7.5* 7.7* 9.0* 8.6*  HCT 23.9* 21.6* 23.5* 27.2* 26.1*  MCV 75.2* 74.0* 77.0* 76.6* 78.6*  PLT 312 281 325 351 327   Basic Metabolic Panel: Recent Labs  Lab 03/30/23 0501  03/31/23 0443 04/03/23 1706  NA 131* 132* 137  K 3.1* 3.6 3.5  CL 103 103 104  CO2 21* 22 25  GLUCOSE 100* 103* 138*  BUN 20 19 17   CREATININE 0.57 0.72 0.63  CALCIUM 8.0* 8.1* 8.6*   Liver Function Tests: No results for input(s): AST, ALT, ALKPHOS, BILITOT, PROT, ALBUMIN in the last 168 hours. CBG: Recent Labs  Lab 03/29/23 1720  GLUCAP 94    Discharge time spent: greater than 30 minutes.  Signed: Owen DELENA Lore, MD [facility] 04/05/2023

## 2023-04-07 DIAGNOSIS — M1380 Other specified arthritis, unspecified site: Secondary | ICD-10-CM | POA: Diagnosis not present

## 2023-04-07 DIAGNOSIS — Z86711 Personal history of pulmonary embolism: Secondary | ICD-10-CM | POA: Diagnosis not present

## 2023-04-07 DIAGNOSIS — M069 Rheumatoid arthritis, unspecified: Secondary | ICD-10-CM | POA: Diagnosis not present

## 2023-04-07 DIAGNOSIS — K922 Gastrointestinal hemorrhage, unspecified: Secondary | ICD-10-CM | POA: Diagnosis not present

## 2023-04-07 DIAGNOSIS — M25512 Pain in left shoulder: Secondary | ICD-10-CM | POA: Diagnosis not present

## 2023-04-07 DIAGNOSIS — R7989 Other specified abnormal findings of blood chemistry: Secondary | ICD-10-CM | POA: Diagnosis not present

## 2023-04-07 DIAGNOSIS — D62 Acute posthemorrhagic anemia: Secondary | ICD-10-CM | POA: Diagnosis not present

## 2023-04-07 DIAGNOSIS — M3500 Sicca syndrome, unspecified: Secondary | ICD-10-CM | POA: Diagnosis not present

## 2023-04-11 DIAGNOSIS — R7989 Other specified abnormal findings of blood chemistry: Secondary | ICD-10-CM | POA: Diagnosis not present

## 2023-04-11 DIAGNOSIS — K922 Gastrointestinal hemorrhage, unspecified: Secondary | ICD-10-CM | POA: Diagnosis not present

## 2023-04-11 DIAGNOSIS — Z86711 Personal history of pulmonary embolism: Secondary | ICD-10-CM | POA: Diagnosis not present

## 2023-04-11 DIAGNOSIS — M3500 Sicca syndrome, unspecified: Secondary | ICD-10-CM | POA: Diagnosis not present

## 2023-04-11 DIAGNOSIS — M1380 Other specified arthritis, unspecified site: Secondary | ICD-10-CM | POA: Diagnosis not present

## 2023-04-11 DIAGNOSIS — M069 Rheumatoid arthritis, unspecified: Secondary | ICD-10-CM | POA: Diagnosis not present

## 2023-04-11 DIAGNOSIS — M25512 Pain in left shoulder: Secondary | ICD-10-CM | POA: Diagnosis not present

## 2023-04-11 DIAGNOSIS — D62 Acute posthemorrhagic anemia: Secondary | ICD-10-CM | POA: Diagnosis not present

## 2023-04-12 DIAGNOSIS — R7989 Other specified abnormal findings of blood chemistry: Secondary | ICD-10-CM | POA: Diagnosis not present

## 2023-04-12 DIAGNOSIS — M25512 Pain in left shoulder: Secondary | ICD-10-CM | POA: Diagnosis not present

## 2023-04-12 DIAGNOSIS — Z86711 Personal history of pulmonary embolism: Secondary | ICD-10-CM | POA: Diagnosis not present

## 2023-04-12 DIAGNOSIS — M069 Rheumatoid arthritis, unspecified: Secondary | ICD-10-CM | POA: Diagnosis not present

## 2023-04-12 DIAGNOSIS — K922 Gastrointestinal hemorrhage, unspecified: Secondary | ICD-10-CM | POA: Diagnosis not present

## 2023-04-12 DIAGNOSIS — M1380 Other specified arthritis, unspecified site: Secondary | ICD-10-CM | POA: Diagnosis not present

## 2023-04-12 DIAGNOSIS — D62 Acute posthemorrhagic anemia: Secondary | ICD-10-CM | POA: Diagnosis not present

## 2023-04-12 DIAGNOSIS — M3500 Sicca syndrome, unspecified: Secondary | ICD-10-CM | POA: Diagnosis not present

## 2023-04-13 DIAGNOSIS — M3500 Sicca syndrome, unspecified: Secondary | ICD-10-CM | POA: Diagnosis not present

## 2023-04-13 DIAGNOSIS — M069 Rheumatoid arthritis, unspecified: Secondary | ICD-10-CM | POA: Diagnosis not present

## 2023-04-13 DIAGNOSIS — Z86711 Personal history of pulmonary embolism: Secondary | ICD-10-CM | POA: Diagnosis not present

## 2023-04-13 DIAGNOSIS — M25512 Pain in left shoulder: Secondary | ICD-10-CM | POA: Diagnosis not present

## 2023-04-13 DIAGNOSIS — K922 Gastrointestinal hemorrhage, unspecified: Secondary | ICD-10-CM | POA: Diagnosis not present

## 2023-04-13 DIAGNOSIS — R7989 Other specified abnormal findings of blood chemistry: Secondary | ICD-10-CM | POA: Diagnosis not present

## 2023-04-13 DIAGNOSIS — D62 Acute posthemorrhagic anemia: Secondary | ICD-10-CM | POA: Diagnosis not present

## 2023-04-13 DIAGNOSIS — M1380 Other specified arthritis, unspecified site: Secondary | ICD-10-CM | POA: Diagnosis not present

## 2023-04-18 DIAGNOSIS — D62 Acute posthemorrhagic anemia: Secondary | ICD-10-CM | POA: Diagnosis not present

## 2023-04-18 DIAGNOSIS — K922 Gastrointestinal hemorrhage, unspecified: Secondary | ICD-10-CM | POA: Diagnosis not present

## 2023-04-19 DIAGNOSIS — M1380 Other specified arthritis, unspecified site: Secondary | ICD-10-CM | POA: Diagnosis not present

## 2023-04-19 DIAGNOSIS — M069 Rheumatoid arthritis, unspecified: Secondary | ICD-10-CM | POA: Diagnosis not present

## 2023-04-19 DIAGNOSIS — M25512 Pain in left shoulder: Secondary | ICD-10-CM | POA: Diagnosis not present

## 2023-04-19 DIAGNOSIS — Z86711 Personal history of pulmonary embolism: Secondary | ICD-10-CM | POA: Diagnosis not present

## 2023-04-19 DIAGNOSIS — D62 Acute posthemorrhagic anemia: Secondary | ICD-10-CM | POA: Diagnosis not present

## 2023-04-19 DIAGNOSIS — M3500 Sicca syndrome, unspecified: Secondary | ICD-10-CM | POA: Diagnosis not present

## 2023-04-19 DIAGNOSIS — K922 Gastrointestinal hemorrhage, unspecified: Secondary | ICD-10-CM | POA: Diagnosis not present

## 2023-04-19 DIAGNOSIS — R7989 Other specified abnormal findings of blood chemistry: Secondary | ICD-10-CM | POA: Diagnosis not present

## 2023-04-20 DIAGNOSIS — Z86711 Personal history of pulmonary embolism: Secondary | ICD-10-CM | POA: Diagnosis not present

## 2023-04-20 DIAGNOSIS — M069 Rheumatoid arthritis, unspecified: Secondary | ICD-10-CM | POA: Diagnosis not present

## 2023-04-20 DIAGNOSIS — R7989 Other specified abnormal findings of blood chemistry: Secondary | ICD-10-CM | POA: Diagnosis not present

## 2023-04-20 DIAGNOSIS — M1380 Other specified arthritis, unspecified site: Secondary | ICD-10-CM | POA: Diagnosis not present

## 2023-04-20 DIAGNOSIS — D62 Acute posthemorrhagic anemia: Secondary | ICD-10-CM | POA: Diagnosis not present

## 2023-04-20 DIAGNOSIS — K922 Gastrointestinal hemorrhage, unspecified: Secondary | ICD-10-CM | POA: Diagnosis not present

## 2023-04-20 DIAGNOSIS — M25512 Pain in left shoulder: Secondary | ICD-10-CM | POA: Diagnosis not present

## 2023-04-20 DIAGNOSIS — M3500 Sicca syndrome, unspecified: Secondary | ICD-10-CM | POA: Diagnosis not present

## 2023-04-21 DIAGNOSIS — D62 Acute posthemorrhagic anemia: Secondary | ICD-10-CM | POA: Diagnosis not present

## 2023-04-21 DIAGNOSIS — M1380 Other specified arthritis, unspecified site: Secondary | ICD-10-CM | POA: Diagnosis not present

## 2023-04-21 DIAGNOSIS — M3500 Sicca syndrome, unspecified: Secondary | ICD-10-CM | POA: Diagnosis not present

## 2023-04-21 DIAGNOSIS — R7989 Other specified abnormal findings of blood chemistry: Secondary | ICD-10-CM | POA: Diagnosis not present

## 2023-04-21 DIAGNOSIS — M069 Rheumatoid arthritis, unspecified: Secondary | ICD-10-CM | POA: Diagnosis not present

## 2023-04-21 DIAGNOSIS — Z86711 Personal history of pulmonary embolism: Secondary | ICD-10-CM | POA: Diagnosis not present

## 2023-04-21 DIAGNOSIS — K922 Gastrointestinal hemorrhage, unspecified: Secondary | ICD-10-CM | POA: Diagnosis not present

## 2023-04-21 DIAGNOSIS — M25512 Pain in left shoulder: Secondary | ICD-10-CM | POA: Diagnosis not present

## 2023-04-25 DIAGNOSIS — K922 Gastrointestinal hemorrhage, unspecified: Secondary | ICD-10-CM | POA: Diagnosis not present

## 2023-04-25 DIAGNOSIS — M1380 Other specified arthritis, unspecified site: Secondary | ICD-10-CM | POA: Diagnosis not present

## 2023-04-25 DIAGNOSIS — M069 Rheumatoid arthritis, unspecified: Secondary | ICD-10-CM | POA: Diagnosis not present

## 2023-04-25 DIAGNOSIS — M3500 Sicca syndrome, unspecified: Secondary | ICD-10-CM | POA: Diagnosis not present

## 2023-04-25 DIAGNOSIS — M25512 Pain in left shoulder: Secondary | ICD-10-CM | POA: Diagnosis not present

## 2023-04-25 DIAGNOSIS — R7989 Other specified abnormal findings of blood chemistry: Secondary | ICD-10-CM | POA: Diagnosis not present

## 2023-04-25 DIAGNOSIS — Z86711 Personal history of pulmonary embolism: Secondary | ICD-10-CM | POA: Diagnosis not present

## 2023-04-25 DIAGNOSIS — D62 Acute posthemorrhagic anemia: Secondary | ICD-10-CM | POA: Diagnosis not present

## 2023-04-26 DIAGNOSIS — M3500 Sicca syndrome, unspecified: Secondary | ICD-10-CM | POA: Diagnosis not present

## 2023-04-26 DIAGNOSIS — M1380 Other specified arthritis, unspecified site: Secondary | ICD-10-CM | POA: Diagnosis not present

## 2023-04-26 DIAGNOSIS — M25512 Pain in left shoulder: Secondary | ICD-10-CM | POA: Diagnosis not present

## 2023-04-26 DIAGNOSIS — D62 Acute posthemorrhagic anemia: Secondary | ICD-10-CM | POA: Diagnosis not present

## 2023-04-26 DIAGNOSIS — M069 Rheumatoid arthritis, unspecified: Secondary | ICD-10-CM | POA: Diagnosis not present

## 2023-04-26 DIAGNOSIS — K922 Gastrointestinal hemorrhage, unspecified: Secondary | ICD-10-CM | POA: Diagnosis not present

## 2023-04-26 DIAGNOSIS — R7989 Other specified abnormal findings of blood chemistry: Secondary | ICD-10-CM | POA: Diagnosis not present

## 2023-04-26 DIAGNOSIS — Z86711 Personal history of pulmonary embolism: Secondary | ICD-10-CM | POA: Diagnosis not present

## 2023-04-27 DIAGNOSIS — D62 Acute posthemorrhagic anemia: Secondary | ICD-10-CM | POA: Diagnosis not present

## 2023-04-27 DIAGNOSIS — M25512 Pain in left shoulder: Secondary | ICD-10-CM | POA: Diagnosis not present

## 2023-04-27 DIAGNOSIS — R7989 Other specified abnormal findings of blood chemistry: Secondary | ICD-10-CM | POA: Diagnosis not present

## 2023-04-27 DIAGNOSIS — Z86711 Personal history of pulmonary embolism: Secondary | ICD-10-CM | POA: Diagnosis not present

## 2023-04-27 DIAGNOSIS — M1380 Other specified arthritis, unspecified site: Secondary | ICD-10-CM | POA: Diagnosis not present

## 2023-04-27 DIAGNOSIS — K922 Gastrointestinal hemorrhage, unspecified: Secondary | ICD-10-CM | POA: Diagnosis not present

## 2023-04-27 DIAGNOSIS — M3500 Sicca syndrome, unspecified: Secondary | ICD-10-CM | POA: Diagnosis not present

## 2023-04-27 DIAGNOSIS — M069 Rheumatoid arthritis, unspecified: Secondary | ICD-10-CM | POA: Diagnosis not present

## 2023-05-02 DIAGNOSIS — M3500 Sicca syndrome, unspecified: Secondary | ICD-10-CM | POA: Diagnosis not present

## 2023-05-02 DIAGNOSIS — K922 Gastrointestinal hemorrhage, unspecified: Secondary | ICD-10-CM | POA: Diagnosis not present

## 2023-05-02 DIAGNOSIS — M069 Rheumatoid arthritis, unspecified: Secondary | ICD-10-CM | POA: Diagnosis not present

## 2023-05-02 DIAGNOSIS — R7989 Other specified abnormal findings of blood chemistry: Secondary | ICD-10-CM | POA: Diagnosis not present

## 2023-05-02 DIAGNOSIS — M25512 Pain in left shoulder: Secondary | ICD-10-CM | POA: Diagnosis not present

## 2023-05-02 DIAGNOSIS — M1380 Other specified arthritis, unspecified site: Secondary | ICD-10-CM | POA: Diagnosis not present

## 2023-05-02 DIAGNOSIS — Z86711 Personal history of pulmonary embolism: Secondary | ICD-10-CM | POA: Diagnosis not present

## 2023-05-02 DIAGNOSIS — D62 Acute posthemorrhagic anemia: Secondary | ICD-10-CM | POA: Diagnosis not present

## 2023-05-03 DIAGNOSIS — M1380 Other specified arthritis, unspecified site: Secondary | ICD-10-CM | POA: Diagnosis not present

## 2023-05-03 DIAGNOSIS — M069 Rheumatoid arthritis, unspecified: Secondary | ICD-10-CM | POA: Diagnosis not present

## 2023-05-03 DIAGNOSIS — M3500 Sicca syndrome, unspecified: Secondary | ICD-10-CM | POA: Diagnosis not present

## 2023-05-03 DIAGNOSIS — R7989 Other specified abnormal findings of blood chemistry: Secondary | ICD-10-CM | POA: Diagnosis not present

## 2023-05-03 DIAGNOSIS — Z86711 Personal history of pulmonary embolism: Secondary | ICD-10-CM | POA: Diagnosis not present

## 2023-05-03 DIAGNOSIS — M25512 Pain in left shoulder: Secondary | ICD-10-CM | POA: Diagnosis not present

## 2023-05-03 DIAGNOSIS — K922 Gastrointestinal hemorrhage, unspecified: Secondary | ICD-10-CM | POA: Diagnosis not present

## 2023-05-03 DIAGNOSIS — D62 Acute posthemorrhagic anemia: Secondary | ICD-10-CM | POA: Diagnosis not present

## 2023-05-05 DIAGNOSIS — M069 Rheumatoid arthritis, unspecified: Secondary | ICD-10-CM | POA: Diagnosis not present

## 2023-05-05 DIAGNOSIS — D62 Acute posthemorrhagic anemia: Secondary | ICD-10-CM | POA: Diagnosis not present

## 2023-05-05 DIAGNOSIS — M3500 Sicca syndrome, unspecified: Secondary | ICD-10-CM | POA: Diagnosis not present

## 2023-05-05 DIAGNOSIS — K922 Gastrointestinal hemorrhage, unspecified: Secondary | ICD-10-CM | POA: Diagnosis not present

## 2023-05-05 DIAGNOSIS — R7989 Other specified abnormal findings of blood chemistry: Secondary | ICD-10-CM | POA: Diagnosis not present

## 2023-05-05 DIAGNOSIS — Z86711 Personal history of pulmonary embolism: Secondary | ICD-10-CM | POA: Diagnosis not present

## 2023-05-05 DIAGNOSIS — M25512 Pain in left shoulder: Secondary | ICD-10-CM | POA: Diagnosis not present

## 2023-05-05 DIAGNOSIS — M1380 Other specified arthritis, unspecified site: Secondary | ICD-10-CM | POA: Diagnosis not present

## 2023-05-09 DIAGNOSIS — M1380 Other specified arthritis, unspecified site: Secondary | ICD-10-CM | POA: Diagnosis not present

## 2023-05-09 DIAGNOSIS — M3500 Sicca syndrome, unspecified: Secondary | ICD-10-CM | POA: Diagnosis not present

## 2023-05-09 DIAGNOSIS — Z86711 Personal history of pulmonary embolism: Secondary | ICD-10-CM | POA: Diagnosis not present

## 2023-05-09 DIAGNOSIS — R7989 Other specified abnormal findings of blood chemistry: Secondary | ICD-10-CM | POA: Diagnosis not present

## 2023-05-09 DIAGNOSIS — M25512 Pain in left shoulder: Secondary | ICD-10-CM | POA: Diagnosis not present

## 2023-05-09 DIAGNOSIS — M069 Rheumatoid arthritis, unspecified: Secondary | ICD-10-CM | POA: Diagnosis not present

## 2023-05-09 DIAGNOSIS — D62 Acute posthemorrhagic anemia: Secondary | ICD-10-CM | POA: Diagnosis not present

## 2023-05-09 DIAGNOSIS — K922 Gastrointestinal hemorrhage, unspecified: Secondary | ICD-10-CM | POA: Diagnosis not present

## 2023-05-17 DIAGNOSIS — K922 Gastrointestinal hemorrhage, unspecified: Secondary | ICD-10-CM | POA: Diagnosis not present

## 2023-05-17 DIAGNOSIS — D62 Acute posthemorrhagic anemia: Secondary | ICD-10-CM | POA: Diagnosis not present

## 2023-05-17 DIAGNOSIS — M069 Rheumatoid arthritis, unspecified: Secondary | ICD-10-CM | POA: Diagnosis not present

## 2023-05-17 DIAGNOSIS — M3500 Sicca syndrome, unspecified: Secondary | ICD-10-CM | POA: Diagnosis not present

## 2023-05-17 DIAGNOSIS — M1380 Other specified arthritis, unspecified site: Secondary | ICD-10-CM | POA: Diagnosis not present

## 2023-05-17 DIAGNOSIS — M25512 Pain in left shoulder: Secondary | ICD-10-CM | POA: Diagnosis not present

## 2023-05-17 DIAGNOSIS — R7989 Other specified abnormal findings of blood chemistry: Secondary | ICD-10-CM | POA: Diagnosis not present

## 2023-05-17 DIAGNOSIS — Z86711 Personal history of pulmonary embolism: Secondary | ICD-10-CM | POA: Diagnosis not present

## 2023-05-18 DIAGNOSIS — D649 Anemia, unspecified: Secondary | ICD-10-CM | POA: Diagnosis not present

## 2023-05-18 DIAGNOSIS — D508 Other iron deficiency anemias: Secondary | ICD-10-CM | POA: Diagnosis not present

## 2023-05-30 DIAGNOSIS — D508 Other iron deficiency anemias: Secondary | ICD-10-CM | POA: Diagnosis not present

## 2023-05-30 DIAGNOSIS — R5383 Other fatigue: Secondary | ICD-10-CM | POA: Diagnosis not present

## 2023-06-07 ENCOUNTER — Emergency Department (HOSPITAL_BASED_OUTPATIENT_CLINIC_OR_DEPARTMENT_OTHER)

## 2023-06-07 ENCOUNTER — Emergency Department (HOSPITAL_BASED_OUTPATIENT_CLINIC_OR_DEPARTMENT_OTHER)
Admission: EM | Admit: 2023-06-07 | Discharge: 2023-06-08 | Disposition: A | Discharge status: Home / Self Care | Attending: Emergency Medicine | Admitting: Emergency Medicine

## 2023-06-07 DIAGNOSIS — B888 Other specified infestations: Secondary | ICD-10-CM | POA: Diagnosis not present

## 2023-06-07 DIAGNOSIS — R079 Chest pain, unspecified: Secondary | ICD-10-CM | POA: Diagnosis not present

## 2023-06-07 DIAGNOSIS — Z7901 Long term (current) use of anticoagulants: Secondary | ICD-10-CM | POA: Diagnosis not present

## 2023-06-07 DIAGNOSIS — R0789 Other chest pain: Secondary | ICD-10-CM | POA: Diagnosis not present

## 2023-06-07 DIAGNOSIS — F0390 Unspecified dementia without behavioral disturbance: Secondary | ICD-10-CM | POA: Diagnosis not present

## 2023-06-07 DIAGNOSIS — S42292A Other displaced fracture of upper end of left humerus, initial encounter for closed fracture: Secondary | ICD-10-CM | POA: Diagnosis not present

## 2023-06-07 DIAGNOSIS — I517 Cardiomegaly: Secondary | ICD-10-CM | POA: Diagnosis not present

## 2023-06-07 DIAGNOSIS — R0989 Other specified symptoms and signs involving the circulatory and respiratory systems: Secondary | ICD-10-CM | POA: Diagnosis not present

## 2023-06-07 LAB — BASIC METABOLIC PANEL
Anion gap: 10 (ref 5–15)
BUN: 15 mg/dL (ref 8–23)
CO2: 25 mmol/L (ref 22–32)
Calcium: 9.6 mg/dL (ref 8.9–10.3)
Chloride: 105 mmol/L (ref 98–111)
Creatinine, Ser: 0.81 mg/dL (ref 0.44–1.00)
GFR, Estimated: >60 mL/min (ref >60)
Glucose, Bld: 83 mg/dL (ref 70–99)
Potassium: 3.4 mmol/L — ABNORMAL LOW (ref 3.5–5.1)
Sodium: 140 mmol/L (ref 135–145)

## 2023-06-07 LAB — CBC
HCT: 32.2 % — ABNORMAL LOW (ref 36.0–46.0)
Hemoglobin: 10.8 g/dL — ABNORMAL LOW (ref 12.0–15.0)
MCH: 25.5 pg — ABNORMAL LOW (ref 26.0–34.0)
MCHC: 33.5 g/dL (ref 30.0–36.0)
MCV: 76.1 fL — ABNORMAL LOW (ref 80.0–100.0)
Platelets: 327 10*3/uL (ref 150–400)
RBC: 4.23 MIL/uL (ref 3.87–5.11)
RDW: 18.4 % — ABNORMAL HIGH (ref 11.5–15.5)
WBC: 5.5 10*3/uL (ref 4.0–10.5)
nRBC: 0 % (ref 0.0–0.2)

## 2023-06-07 LAB — TROPONIN I (HIGH SENSITIVITY): Troponin I (High Sensitivity): 5 ng/L (ref <18)

## 2023-06-07 NOTE — ED Provider Notes (Signed)
 [place] EMERGENCY DEPARTMENT AT [facility] Provider Note   CSN: [id number] Arrival date & time: 06/07/23  2133     History  No chief complaint on file.   Connie Snyder is a 89 y.o. female.  89 year old female with a history of dementia and PE on Xarelto who presents emergency department chest pain.  History predominantly obtained from her son who reports that today from 6 to 86 PM she was complaining of left-sided chest pain.  Has difficulty characterizing but says that it was worsened with movement.  Unsure if exertional.  No shortness of breath or cough.  Has been compliant with her Xarelto which her son gives to her.  No fevers.  Was treated for pneumonia recently but is reportedly over this.  She does not have a history of MI.  Son gave 2 aspirin prior to arrival.       Home Medications Prior to Admission medications   Medication Sig Start Date End Date Taking? Authorizing Provider  cyanocobalamin 1000 MCG tablet Take 1 tablet (1,000 mcg total) by mouth daily. 04/04/23   Regalado, Belkys A, MD  ELIQUIS 2.5 MG TABS tablet Take 2.5 mg by mouth 2 (two) times daily.    [provider]  feeding supplement (BOOST HIGH PROTEIN) LIQD Take 1 Container by mouth daily.    [provider]  ferrous sulfate 325 (65 FE) MG EC tablet Take 1 tablet (325 mg total) by mouth daily with breakfast. 04/04/23 11/30/23  Regalado, Jon Billings A, MD  folic acid (FOLVITE) 1 MG tablet Take 1 tablet (1 mg total) by mouth daily. 04/05/23   Regalado, Belkys A, MD  mirtazapine (REMERON) 15 MG tablet TAKE 1 TABLET BY MOUTH EVERY DAY AT BEDTIME FOR 90 DAYS for 90    [provider]  Multiple Vitamins-Minerals (MULTIVITAMIN WOMEN 50+) TABS Take 1 tablet by mouth daily with breakfast.    [provider]  Multiple Vitamins-Minerals (ZINC PO) Take 1 tablet by mouth daily.    [provider]  pantoprazole (PROTONIX) 40 MG tablet Take 1 tablet (40 mg total) by mouth 2  (two) times daily. 04/04/23   Regalado, Belkys A, MD  senna-docusate (SENOKOT-S) 8.6-50 MG tablet Take 1 tablet by mouth 2 (two) times daily. 04/04/23   Regalado, Belkys A, MD  thiamine (VITAMIN B-1) 100 MG tablet Take 1 tablet (100 mg total) by mouth daily. 04/05/23   Regalado, Jon Billings A, MD      Allergies    Aricept [donepezil]    Review of Systems   Review of Systems  Physical Exam Updated Vital Signs BP (!) 153/73   Pulse 63   Temp 98 F (36.7 C)   Resp 20   SpO2 100%  Physical Exam Vitals and nursing note reviewed.  Constitutional:      General: She is not in acute distress.    Appearance: She is well-developed.  HENT:     Head: Normocephalic and atraumatic.     Right Ear: External ear normal.     Left Ear: External ear normal.     Nose: Nose normal.  Eyes:     Extraocular Movements: Extraocular movements intact.     Conjunctiva/sclera: Conjunctivae normal.     Pupils: Pupils are equal, round, and reactive to light.  Cardiovascular:     Rate and Rhythm: Normal rate and regular rhythm.     Heart sounds: No murmur heard.    Comments: Chest pain not reproducible.  Radial pulses 2+ bilaterally. Pulmonary:  Effort: Pulmonary effort is normal. No respiratory distress.     Breath sounds: Normal breath sounds.  Musculoskeletal:     Cervical back: Normal range of motion and neck supple.     Right lower leg: No edema.     Left lower leg: No edema.  Skin:    General: Skin is warm and dry.  Neurological:     Mental Status: She is alert and oriented to person, place, and time. Mental status is at baseline.  Psychiatric:        Mood and Affect: Mood normal.     ED Results / Procedures / Treatments   Labs (all labs ordered are listed, but only abnormal results are displayed) Labs Reviewed  BASIC METABOLIC PANEL - Abnormal; Notable for the following components:      Result Value   Potassium 3.4 (*)    All other components within normal limits  CBC - Abnormal; Notable  for the following components:   Hemoglobin 10.8 (*)    HCT 32.2 (*)    MCV 76.1 (*)    MCH 25.5 (*)    RDW 18.4 (*)    All other components within normal limits  TROPONIN I (HIGH SENSITIVITY)  TROPONIN I (HIGH SENSITIVITY)    EKG EKG Interpretation Date/Time:  Wednesday June 07 2023 21:54:37 EST Ventricular Rate:  64 PR Interval:  167 QRS Duration:  124 QT Interval:  439 QTC Calculation: 453 R Axis:   -64  Text Interpretation: Sinus rhythm Left bundle branch block Confirmed by Vonita Moss [phone]) on 06/07/2023 9:56:59 PM  Radiology No results found.  Procedures Procedures    Medications Ordered in ED Medications - No data to display  ED Course/ Medical Decision Making/ A&P Clinical Course as of 06/07/23 2334  Wed Jun 07, 2023  2333 Signed out to Dr Adela Lank [RP]    Clinical Course User Index [RP] Rondel Baton, MD                                 Medical Decision Making Amount and/or Complexity of Data Reviewed Labs: ordered. Radiology: ordered.   Connie Snyder is a 89 y.o. female with comorbidities that complicate the patient evaluation including dementia and PE on Xarelto who presents emergency department left-sided chest pain  Initial Ddx:  MI, PE, dissection, pericarditis, MSK pain, bedbugs  MDM/Course:  Patient presents emergency department left-sided chest pain.  Does have a history of PE and has been compliant with her Xarelto.  No other respiratory symptoms.  On exam is well-appearing.  Was found to have bedbugs by staff and her son said that he did have to have her mattress treated for this recently.  Does not have a history of MI but given her symptoms EKG was obtained which shows an old left bundle.  Initial troponin was WNL.  Considered PE but with her Xarelto being given to her by family member feel that PE is less likely.  Will obtain chest x-ray to evaluate for widened mediastinum but given the description of her pain feel that dissection  is less likely.  Patient placed on contact precautions for bedbugs.  This patient presents to the ED for concern of complaints listed in HPI, this involves an extensive number of treatment options, and is a complaint that carries with it a high risk of complications and morbidity. Disposition including potential need for admission considered.  Dispo: Pending remainder of  workup  Additional history obtained from son Records reviewed Outpatient Clinic Notes The following labs were independently interpreted: Chemistry and show no acute abnormality I personally reviewed and interpreted the pt's EKG: see above for interpretation  I have reviewed the patients home medications and made adjustments as needed Social Determinants of health:  Elderly  Portions of this note were generated with Scientist, clinical (histocompatibility and immunogenetics). Dictation errors may occur despite best attempts at proofreading.     Final Clinical Impression(s) / ED Diagnoses Final diagnoses:  Chest pain, unspecified type  Infestation by bed bug    Rx / DC Orders ED Discharge Orders     None         Rondel Baton, MD 06/07/23 [phone]

## 2023-06-07 NOTE — ED Notes (Signed)
 Pt clothing removed. Cleaned with wipes head to toe. Hair net applied. Belongings double bagged- trash removed and double bagged.

## 2023-06-07 NOTE — ED Provider Notes (Signed)
 Received patient in turnover from Dr. Jarold Motto.  Please see their note for further details of Hx, PE.  Briefly patient is a 89 y.o. female with a No chief complaint on file. .  Patient with chest pain.  Plan for delta..   Delta negative.  D/c home.  PCP follow up.    Melene Plan, DO 06/08/23 [phone]

## 2023-06-08 LAB — TROPONIN I (HIGH SENSITIVITY): Troponin I (High Sensitivity): 5 ng/L (ref <18)

## 2023-06-08 NOTE — ED Notes (Signed)
 Pt ambulated to and from restroom with assistance by this RN, denies CP at this time

## 2023-06-08 NOTE — Discharge Instructions (Signed)
 Your markers for heart damage were normal.  This makes this unlikely to be a heart attack.  Please follow up with your doctor in the office.

## 2023-06-08 NOTE — ED Notes (Signed)
 Pt assisted to restroom in ED room 4. Changed of dirty brief. Linen changed. Returns to bed without incident.

## 2023-06-08 NOTE — ED Notes (Signed)
 RN reviewed discharge instructions with pt. Pt verbalized understanding and had no further questions. VSS upon discharge.

## 2023-07-19 DIAGNOSIS — Z0000 Encounter for general adult medical examination without abnormal findings: Secondary | ICD-10-CM | POA: Diagnosis not present

## 2023-07-19 DIAGNOSIS — D508 Other iron deficiency anemias: Secondary | ICD-10-CM | POA: Diagnosis not present

## 2023-07-19 DIAGNOSIS — D649 Anemia, unspecified: Secondary | ICD-10-CM | POA: Diagnosis not present

## 2023-07-19 DIAGNOSIS — I700 Atherosclerosis of aorta: Secondary | ICD-10-CM | POA: Diagnosis not present

## 2023-07-19 DIAGNOSIS — R5383 Other fatigue: Secondary | ICD-10-CM | POA: Diagnosis not present

## 2023-07-26 DIAGNOSIS — Z86711 Personal history of pulmonary embolism: Secondary | ICD-10-CM | POA: Diagnosis not present

## 2023-07-26 DIAGNOSIS — I700 Atherosclerosis of aorta: Secondary | ICD-10-CM | POA: Diagnosis not present

## 2023-07-26 DIAGNOSIS — M3500 Sicca syndrome, unspecified: Secondary | ICD-10-CM | POA: Diagnosis not present

## 2023-07-26 DIAGNOSIS — B889 Infestation, unspecified: Secondary | ICD-10-CM | POA: Diagnosis not present

## 2023-07-26 DIAGNOSIS — M069 Rheumatoid arthritis, unspecified: Secondary | ICD-10-CM | POA: Diagnosis not present

## 2023-07-26 DIAGNOSIS — D508 Other iron deficiency anemias: Secondary | ICD-10-CM | POA: Diagnosis not present

## 2023-07-26 DIAGNOSIS — I38 Endocarditis, valve unspecified: Secondary | ICD-10-CM | POA: Diagnosis not present

## 2023-07-26 DIAGNOSIS — R600 Localized edema: Secondary | ICD-10-CM | POA: Diagnosis not present

## 2023-07-26 DIAGNOSIS — Z0000 Encounter for general adult medical examination without abnormal findings: Secondary | ICD-10-CM | POA: Diagnosis not present

## 2023-07-27 ENCOUNTER — Telehealth: Payer: Self-pay

## 2023-07-27 DIAGNOSIS — F0150 Vascular dementia without behavioral disturbance: Secondary | ICD-10-CM

## 2023-07-27 NOTE — Patient Outreach (Signed)
 Call made to the [facility] and spoke with Myrick Ask, Medical Office Coordinator. The office had placed a referral for [id number] due to concerns when the son brought the patient into the office for a visit and their were bed bugs present on the patient and the smell of urine. Per the office the patient has dementia and is seeking help from the [facility] to help address sensitive topics. They do not feel that the patient is in danger but want recommendations from the [employer] team and social worker support.    Collaboration to be made with [employer], LCSW.  The office did ask for follow up as the patient has an appointment in 3 weeks.    Will continue to monitor.  Pryor Browning RN, MSN, CCM Manager [facility] [place]  Direct Number: [phone]

## 2023-07-28 ENCOUNTER — Telehealth: Payer: Self-pay | Admitting: *Deleted

## 2023-07-28 NOTE — Progress Notes (Signed)
 Complex Care Management Note Care Guide Note  07/28/2023 Name: Connie Snyder MRN: [id number] DOB: Jan 13, 1928   Complex Care Management Outreach Attempts: An unsuccessful telephone outreach was attempted today to offer the patient information about available complex care management services.  Follow Up Plan:  Additional outreach attempts will be made to offer the patient complex care management information and services.   Encounter Outcome:  No Answer  Kandis Ormond, CMA [place]  [facility], [facility] Guide Direct Dial: [phone]  Fax: [phone] Website: [email or web address]

## 2023-07-31 NOTE — Progress Notes (Unsigned)
 Complex Care Management Note Care Guide Note  07/31/2023 Name: Connie Snyder MRN: [id number] DOB: 05/17/1927   Complex Care Management Outreach Attempts: A second unsuccessful outreach was attempted today to offer the patient with information about available complex care management services.  Follow Up Plan:  Additional outreach attempts will be made to offer the patient complex care management information and services.   Encounter Outcome:  No Answer  Kandis Ormond, CMA [place]  [facility], [facility] Guide Direct Dial: [phone]  Fax: [phone] Website: [email or web address]

## 2023-08-02 NOTE — Progress Notes (Signed)
 Complex Care Management Note Care Guide Note  08/02/2023 Name: Connie Snyder MRN: [id number] DOB: [birth year]   Complex Care Management Outreach Attempts: A third unsuccessful outreach was attempted today to offer the patient with information about available complex care management services.  Follow Up Plan:  No further outreach attempts will be made at this time. We have been unable to contact the patient to offer or enroll patient in complex care management services.  Encounter Outcome:  No Answer  Kandis Ormond, CMA [place]  [facility], [facility] Guide Direct Dial: [phone]  Fax: [phone] Website: [email or web address]

## 2023-08-09 NOTE — Patient Outreach (Signed)
 Complex Care Management   Visit Note  08/09/2023  Name:  Connie Snyder MRN: [id number] DOB: [birth year]  Situation: Communication received from Myrick Ask with the patients primary care providers office to follow up on the outcome of patients social work referral. Unfortunately, the patients referral has been closed due to an inability to contact the patient.   Recommendation:   Providers office may contact the patient to encourage she return outreach calls to enroll in complex care management services. If there is a safety concern, the provider or an office representative may contact [facility] at [phone].  Follow Up Plan:   Letter mailed to patients educating the patient on how to engage with the [facility] team for assistance with Complex Care Management and to address resource needs. Information on [employer] [facility] was also provided.  Carter Clare, BSW, CDP [place]  [facility] - [facility] Manager Population Health Direct Dial: [phone]  Fax: [phone]

## 2023-11-15 ENCOUNTER — Other Ambulatory Visit: Payer: Self-pay

## 2023-11-15 ENCOUNTER — Emergency Department (HOSPITAL_BASED_OUTPATIENT_CLINIC_OR_DEPARTMENT_OTHER)
Admission: EM | Admit: 2023-11-15 | Discharge: 2023-11-15 | Disposition: A | Discharge status: Home / Self Care | Attending: Emergency Medicine | Admitting: Emergency Medicine

## 2023-11-15 ENCOUNTER — Emergency Department (HOSPITAL_BASED_OUTPATIENT_CLINIC_OR_DEPARTMENT_OTHER)

## 2023-11-15 DIAGNOSIS — I2510 Atherosclerotic heart disease of native coronary artery without angina pectoris: Secondary | ICD-10-CM | POA: Diagnosis not present

## 2023-11-15 DIAGNOSIS — R55 Syncope and collapse: Secondary | ICD-10-CM | POA: Diagnosis not present

## 2023-11-15 DIAGNOSIS — R0602 Shortness of breath: Secondary | ICD-10-CM

## 2023-11-15 DIAGNOSIS — I771 Stricture of artery: Secondary | ICD-10-CM | POA: Diagnosis not present

## 2023-11-15 DIAGNOSIS — Z86711 Personal history of pulmonary embolism: Secondary | ICD-10-CM | POA: Diagnosis not present

## 2023-11-15 DIAGNOSIS — R9082 White matter disease, unspecified: Secondary | ICD-10-CM | POA: Diagnosis not present

## 2023-11-15 DIAGNOSIS — Z7901 Long term (current) use of anticoagulants: Secondary | ICD-10-CM | POA: Diagnosis not present

## 2023-11-15 DIAGNOSIS — I77810 Thoracic aortic ectasia: Secondary | ICD-10-CM | POA: Diagnosis not present

## 2023-11-15 DIAGNOSIS — K7689 Other specified diseases of liver: Secondary | ICD-10-CM | POA: Diagnosis not present

## 2023-11-15 DIAGNOSIS — R7989 Other specified abnormal findings of blood chemistry: Secondary | ICD-10-CM | POA: Diagnosis not present

## 2023-11-15 LAB — COMPREHENSIVE METABOLIC PANEL WITH GFR
ALT: <5 U/L (ref 0–44)
AST: 17 U/L (ref 15–41)
Albumin: 3.5 g/dL (ref 3.5–5.0)
Alkaline Phosphatase: 71 U/L (ref 38–126)
Anion gap: 11 (ref 5–15)
BUN: 14 mg/dL (ref 8–23)
CO2: 24 mmol/L (ref 22–32)
Calcium: 9 mg/dL (ref 8.9–10.3)
Chloride: 104 mmol/L (ref 98–111)
Creatinine, Ser: 0.67 mg/dL (ref 0.44–1.00)
GFR, Estimated: >60 mL/min (ref >60)
Glucose, Bld: 84 mg/dL (ref 70–99)
Potassium: 3.5 mmol/L (ref 3.5–5.1)
Sodium: 139 mmol/L (ref 135–145)
Total Bilirubin: 0.4 mg/dL (ref 0.0–1.2)
Total Protein: 6.6 g/dL (ref 6.5–8.1)

## 2023-11-15 LAB — CBC
HCT: 27.6 % — ABNORMAL LOW (ref 36.0–46.0)
Hemoglobin: 9.6 g/dL — ABNORMAL LOW (ref 12.0–15.0)
MCH: 27 pg (ref 26.0–34.0)
MCHC: 34.8 g/dL (ref 30.0–36.0)
MCV: 77.7 fL — ABNORMAL LOW (ref 80.0–100.0)
Platelets: 284 K/uL (ref 150–400)
RBC: 3.55 MIL/uL — ABNORMAL LOW (ref 3.87–5.11)
RDW: 15.6 % — ABNORMAL HIGH (ref 11.5–15.5)
WBC: 4.8 K/uL (ref 4.0–10.5)
nRBC: 0 % (ref 0.0–0.2)

## 2023-11-15 LAB — TROPONIN T, HIGH SENSITIVITY
Troponin T High Sensitivity: 16 ng/L (ref 0–19)
Troponin T High Sensitivity: 17 ng/L (ref 0–19)

## 2023-11-15 LAB — D-DIMER, QUANTITATIVE: D-Dimer, Quant: 13.7 ug{FEU}/mL — ABNORMAL HIGH (ref 0.00–0.50)

## 2023-11-15 LAB — CBG MONITORING, ED: Glucose-Capillary: 87 mg/dL (ref 70–99)

## 2023-11-15 MED ORDER — SODIUM CHLORIDE 0.9 % IV BOLUS
500.0000 mL | Freq: Once | INTRAVENOUS | Status: AC
  Administered 2023-11-15 (×2): 500 mL via INTRAVENOUS

## 2023-11-15 MED ORDER — IOHEXOL 350 MG/ML SOLN
60.0000 mL | Freq: Once | INTRAVENOUS | Status: AC | PRN
  Administered 2023-11-15 (×2): 75 mL via INTRAVENOUS

## 2023-11-15 NOTE — ED Provider Notes (Signed)
 [place] EMERGENCY DEPARTMENT AT [facility]  Provider Note  CSN: [id number] Arrival date & time: 11/15/23 1619  History Chief Complaint  Patient presents with   Shortness of Breath   Near Syncope    AISSATA Snyder is a 89 y.o. female with history of PE on Eliquis  but no known CAD brought to the ED by son who is her primary caregiver. Son provided most of history and reports she slept in past noon today which is not unusual but when he went to get her up and sat her on the side of the bed, she began complaining of feeling SOB. She did not report any chest pain but then had a brief syncopal episode lasting just a few seconds. She was having some garbled speech afterwards also for only a few seconds. She still isn't quite back to her normal self but is improved considerably from before. No recent fever or cough. No new leg swelling.    Home Medications Prior to Admission medications   Medication Sig Start Date End Date Taking? Authorizing Provider  cyanocobalamin  1000 MCG tablet Take 1 tablet (1,000 mcg total) by mouth daily. 04/04/23   Regalado, Belkys A, MD  ELIQUIS  2.5 MG TABS tablet Take 2.5 mg by mouth 2 (two) times daily.    [provider]  feeding supplement (BOOST HIGH PROTEIN) LIQD Take 1 Container by mouth daily.    [provider]  ferrous sulfate  325 (65 FE) MG EC tablet Take 1 tablet (325 mg total) by mouth daily with breakfast. 04/04/23 11/30/23  Regalado, Belkys A, MD  folic acid  (FOLVITE ) 1 MG tablet Take 1 tablet (1 mg total) by mouth daily. 04/05/23   Regalado, Belkys A, MD  mirtazapine  (REMERON ) 15 MG tablet TAKE 1 TABLET BY MOUTH EVERY DAY AT BEDTIME FOR 90 DAYS for 90    [provider]  Multiple Vitamins-Minerals (MULTIVITAMIN WOMEN 50+) TABS Take 1 tablet by mouth daily with breakfast.    [provider]  Multiple Vitamins-Minerals (ZINC PO) Take 1 tablet by mouth daily.    [provider]  pantoprazole  (PROTONIX )  40 MG tablet Take 1 tablet (40 mg total) by mouth 2 (two) times daily. 04/04/23   Regalado, Belkys A, MD  senna-docusate (SENOKOT-S) 8.6-50 MG tablet Take 1 tablet by mouth 2 (two) times daily. 04/04/23   Regalado, Belkys A, MD  thiamine  (VITAMIN B-1) 100 MG tablet Take 1 tablet (100 mg total) by mouth daily. 04/05/23   Regalado, Owen LABOR, MD     Allergies    Aricept  [donepezil ]   Review of Systems   Review of Systems Please see HPI for pertinent positives and negatives  Physical Exam BP 133/78   Pulse (!) 59   Temp 98.3 F (36.8 C) (Oral)   Resp 16   Ht 5' 4 (1.626 m)   Wt 49 kg   SpO2 98%   BMI 18.54 kg/m   Physical Exam Vitals and nursing note reviewed.  Constitutional:      Appearance: Normal appearance.  HENT:     Head: Normocephalic and atraumatic.     Nose: Nose normal.     Mouth/Throat:     Mouth: Mucous membranes are moist.  Eyes:     Extraocular Movements: Extraocular movements intact.     Conjunctiva/sclera: Conjunctivae normal.  Cardiovascular:     Rate and Rhythm: Normal rate.  Pulmonary:     Effort: Pulmonary effort is normal.     Breath sounds: Normal breath  sounds.  Abdominal:     General: Abdomen is flat.     Palpations: Abdomen is soft.     Tenderness: There is no abdominal tenderness.  Musculoskeletal:        General: No swelling. Normal range of motion.     Cervical back: Neck supple.  Skin:    General: Skin is warm and dry.  Neurological:     General: No focal deficit present.     Mental Status: She is alert.  Psychiatric:        Mood and Affect: Mood normal.     ED Results / Procedures / Treatments   EKG EKG Interpretation Date/Time:  Wednesday November 15 2023 16:30:33 EDT Ventricular Rate:  58 PR Interval:  169 QRS Duration:  123 QT Interval:  471 QTC Calculation: 463 R Axis:   -81  Text Interpretation: Sinus rhythm Nonspecific IVCD with LAD Left ventricular hypertrophy No significant change since last tracing Confirmed by  Roselyn Dunnings [phone]) on 11/15/2023 4:33:55 PM  Procedures Procedures  Medications Ordered in the ED Medications  iohexol  (OMNIPAQUE ) 350 MG/ML injection 60 mL (75 mLs Intravenous Contrast Given 11/15/23 1740)  sodium chloride  0.9 % bolus 500 mL (500 mLs Intravenous New Bag/Given 11/15/23 1953)    Initial Impression and Plan  Patient here with an episode of SOB and syncope earlier today. Improved since then, no complaints at present. Consider ICH, stroke, PE, etc. Will check labs and CXR, send for CT head. EKG with baseline LBBB is unchanged. Vitals are reassuring.   ED Course   Clinical Course as of 11/15/23 2046  Wed Nov 15, 2023  1706 CBC with anemia, about at baseline.  [CS]  1728 Dimer is elevated, will send for CTA to eval new PE.  [CS]  1744 CMP and trop are normal.  [CS]  1846 I personally viewed the images from radiology studies and agree with radiologist interpretation: CT head is neg for acute process.  [CS]  1900 I personally viewed the images from radiology studies and agree with radiologist interpretation:  CTA without acute PE. CXR is clear [CS]  2003 Patient feeling well, no complaints and back to baseline. Son requesting a bag of IVF for possible dehydration, Discussed admission vs outpatient follow up, son reports they would like to go home.  [CS]    Clinical Course User Index [CS] Roselyn Dunnings NOVAK, MD     MDM Rules/Calculators/A&P Medical Decision Making Given presenting complaint, I considered that admission might be necessary. After review of results from ED lab and/or imaging studies, admission to the hospital is not indicated at this time.    Problems Addressed: Shortness of breath: acute illness or injury Syncope, unspecified syncope type: acute illness or injury  Amount and/or Complexity of Data Reviewed Labs: ordered. Decision-making details documented in ED Course. Radiology: ordered and independent interpretation performed. Decision-making  details documented in ED Course. ECG/medicine tests: ordered and independent interpretation performed. Decision-making details documented in ED Course.  Risk Prescription drug management. Decision regarding hospitalization.     Final Clinical Impression(s) / ED Diagnoses Final diagnoses:  Syncope, unspecified syncope type  Shortness of breath    Rx / DC Orders ED Discharge Orders     None        Roselyn Dunnings NOVAK, MD 11/15/23 2046

## 2023-11-15 NOTE — ED Notes (Signed)
 Patient transported to CT

## 2023-11-15 NOTE — ED Triage Notes (Signed)
 Pt POV reporting fatigue and decreased appetite x2 days, son attempted to get pt out of bed, pt felt faint and was assisted back to bed and requested to come to hospital. Son reports slurred speech about 1hr ago that has now resolved, no unilateral weakness noted at this time. Hx vascular dementia and PE, on Eliquis .

## 2023-11-16 DIAGNOSIS — R55 Syncope and collapse: Secondary | ICD-10-CM | POA: Diagnosis not present

## 2023-11-22 ENCOUNTER — Encounter: Payer: Self-pay | Admitting: Licensed Clinical Social Worker

## 2023-11-22 NOTE — Patient Instructions (Signed)
 Hargis JINNY Ellen - I am sorry I was unable to reach you today to discuss a referral to Complex Care Management through [facility]. I work with Verdia Lombard, MD and am calling to support your healthcare needs. Please contact me at [phone] at your earliest convenience. I look forward to speaking with you soon.   Thank you,  Rolin Kerns, LCSW [place]  [facility], [facility] Clinical Social Worker Direct Dial: [phone]  Fax: [phone] Website: [email or web address] 4:20 PM

## 2023-12-03 ENCOUNTER — Other Ambulatory Visit: Payer: Self-pay

## 2023-12-03 ENCOUNTER — Emergency Department (HOSPITAL_BASED_OUTPATIENT_CLINIC_OR_DEPARTMENT_OTHER)
Admission: EM | Admit: 2023-12-03 | Discharge: 2023-12-03 | Disposition: A | Discharge status: Home / Self Care | Attending: Emergency Medicine | Admitting: Emergency Medicine

## 2023-12-03 ENCOUNTER — Encounter (HOSPITAL_BASED_OUTPATIENT_CLINIC_OR_DEPARTMENT_OTHER): Payer: Self-pay

## 2023-12-03 ENCOUNTER — Emergency Department (HOSPITAL_BASED_OUTPATIENT_CLINIC_OR_DEPARTMENT_OTHER)

## 2023-12-03 DIAGNOSIS — S42414A Nondisplaced simple supracondylar fracture without intercondylar fracture of right humerus, initial encounter for closed fracture: Secondary | ICD-10-CM | POA: Diagnosis not present

## 2023-12-03 DIAGNOSIS — F0390 Unspecified dementia without behavioral disturbance: Secondary | ICD-10-CM | POA: Diagnosis not present

## 2023-12-03 DIAGNOSIS — W228XXA Striking against or struck by other objects, initial encounter: Secondary | ICD-10-CM | POA: Diagnosis not present

## 2023-12-03 DIAGNOSIS — Z7901 Long term (current) use of anticoagulants: Secondary | ICD-10-CM | POA: Insufficient documentation

## 2023-12-03 DIAGNOSIS — S42411A Displaced simple supracondylar fracture without intercondylar fracture of right humerus, initial encounter for closed fracture: Secondary | ICD-10-CM

## 2023-12-03 DIAGNOSIS — M7989 Other specified soft tissue disorders: Secondary | ICD-10-CM | POA: Diagnosis present

## 2023-12-03 NOTE — ED Notes (Signed)
 [employer] and crackers were served to the family at bedside.

## 2023-12-03 NOTE — ED Provider Notes (Signed)
 [place] EMERGENCY DEPARTMENT AT [facility] Provider Note   CSN: [id number] Arrival date & time: 12/03/23  8190     Patient presents with: Arm Injury   Connie Snyder is a 89 y.o. female.   Patient is a 89 year old who presents with swelling in her right elbow.  She has a history of dementia, Sjogren's disease and prior PE on Eliquis .  History is obtained from the son who is at bedside who says that she hit it on a desk or table a few days ago.  She has noted some swelling to the area.  She did not have any other injury.       Prior to Admission medications   Medication Sig Start Date End Date Taking? Authorizing Provider  cyanocobalamin  1000 MCG tablet Take 1 tablet (1,000 mcg total) by mouth daily. 04/04/23   Regalado, Belkys A, MD  ELIQUIS  2.5 MG TABS tablet Take 2.5 mg by mouth 2 (two) times daily.    [provider]  feeding supplement (BOOST HIGH PROTEIN) LIQD Take 1 Container by mouth daily.    [provider]  ferrous sulfate  325 (65 FE) MG EC tablet Take 1 tablet (325 mg total) by mouth daily with breakfast. 04/04/23 11/30/23  Regalado, Belkys A, MD  folic acid  (FOLVITE ) 1 MG tablet Take 1 tablet (1 mg total) by mouth daily. 04/05/23   Regalado, Belkys A, MD  mirtazapine  (REMERON ) 15 MG tablet TAKE 1 TABLET BY MOUTH EVERY DAY AT BEDTIME FOR 90 DAYS for 90    [provider]  Multiple Vitamins-Minerals (MULTIVITAMIN WOMEN 50+) TABS Take 1 tablet by mouth daily with breakfast.    [provider]  Multiple Vitamins-Minerals (ZINC PO) Take 1 tablet by mouth daily.    [provider]  pantoprazole  (PROTONIX ) 40 MG tablet Take 1 tablet (40 mg total) by mouth 2 (two) times daily. 04/04/23   Regalado, Belkys A, MD  senna-docusate (SENOKOT-S) 8.6-50 MG tablet Take 1 tablet by mouth 2 (two) times daily. 04/04/23   Regalado, Belkys A, MD  thiamine  (VITAMIN B-1) 100 MG tablet Take 1 tablet (100 mg total) by mouth daily. 04/05/23    Regalado, Owen LABOR, MD    Allergies: Aricept  [donepezil ]    Review of Systems  Constitutional:  Negative for fever.  Gastrointestinal:  Negative for nausea and vomiting.  Musculoskeletal:  Positive for arthralgias and joint swelling. Negative for back pain and neck pain.  Skin:  Negative for wound.  Neurological:  Negative for weakness, numbness and headaches.    Updated Vital Signs BP 104/71 (BP Location: Right Arm)   Pulse 87   Temp 97.7 F (36.5 C) (Tympanic)   Resp 18   Ht 5' 4 (1.626 m)   Wt 47.6 kg   SpO2 97%   BMI 18.02 kg/m   Physical Exam Constitutional:      Appearance: She is well-developed.  HENT:     Head: Normocephalic and atraumatic.  Cardiovascular:     Rate and Rhythm: Normal rate.  Pulmonary:     Effort: Pulmonary effort is normal.  Musculoskeletal:        General: Tenderness present.     Cervical back: Normal range of motion and neck supple.     Comments: Patient has some minor swelling and generalized tenderness to the right elbow.  No pain to the shoulder or wrist.  She has good strength and movement in her right hand.  Seems to have normal sensation in the hand although  difficult to ascertain given her dementia.  Radial pulses intact.  No wounds are noted.  Skin:    General: Skin is warm and dry.  Neurological:     Mental Status: She is alert and oriented to person, place, and time.     (all labs ordered are listed, but only abnormal results are displayed) Labs Reviewed - No data to display  EKG: None  Radiology: DG Elbow Complete Right Result Date: 12/03/2023 CLINICAL DATA:  Pain after injury. EXAM: RIGHT ELBOW - COMPLETE 3+ VIEW COMPARISON:  None Available. FINDINGS: Nondisplaced supracondylar fracture is faintly visualized. There is an elbow joint effusion. Normal alignment, no dislocation. Generalized soft tissue edema. IMPRESSION: Nondisplaced supracondylar fracture with elbow joint effusion. Electronically Signed   By: Snyder Gasman  M.D.   On: 12/03/2023 19:05     Procedures   Medications Ordered in the ED - No data to display                                  Medical Decision Making Amount and/or Complexity of Data Reviewed Radiology: ordered.   Patient presents with a localized injury to her right elbow.  X-rays were obtained which were interpreted by me and confirmed by the radiologist to show a nondisplaced supracondylar fracture with associated joint effusion.  Discussed with Dr. Celena with orthopedic surgery who recommends a posterior splint and they can follow-up in his office.  Explained these findings to the patient's son at bedside.  They will use Tylenol  for pain.  She was discharged home in good condition.  Return precautions were given.     Final diagnoses:  Closed supracondylar fracture of right elbow, initial encounter    ED Discharge Orders     None          Connie Andrea, MD 12/03/23 [phone]

## 2023-12-03 NOTE — Discharge Instructions (Signed)
 You can use Tylenol  for pain.  Keep the arm in the splint.  Make an appointment to follow-up with Dr. Celena.  Return to the emergency room if you have any worsening symptoms.

## 2023-12-03 NOTE — ED Triage Notes (Signed)
 Pt son reports pt struck R arm against a table x4 days ago. Pt is on blood thinners.

## 2023-12-03 NOTE — ED Notes (Signed)
 DC paperwork given and verbally understood.

## 2023-12-05 ENCOUNTER — Telehealth: Payer: Self-pay | Admitting: Orthopedic Surgery

## 2023-12-05 NOTE — Telephone Encounter (Signed)
 Patient son called and needs to get in for his mother broken elbow. CB#[phone]

## 2023-12-06 ENCOUNTER — Emergency Department (HOSPITAL_COMMUNITY)

## 2023-12-06 ENCOUNTER — Encounter (HOSPITAL_COMMUNITY): Payer: Self-pay | Admitting: Emergency Medicine

## 2023-12-06 ENCOUNTER — Emergency Department (HOSPITAL_COMMUNITY)
Admission: EM | Admit: 2023-12-06 | Discharge: 2024-01-13 | Disposition: A | Discharge status: Home / Self Care | Source: Ambulatory Visit | Attending: Emergency Medicine | Admitting: Emergency Medicine

## 2023-12-06 DIAGNOSIS — M7989 Other specified soft tissue disorders: Secondary | ICD-10-CM | POA: Diagnosis not present

## 2023-12-06 DIAGNOSIS — Z7189 Other specified counseling: Secondary | ICD-10-CM | POA: Diagnosis not present

## 2023-12-06 DIAGNOSIS — Z7901 Long term (current) use of anticoagulants: Secondary | ICD-10-CM | POA: Insufficient documentation

## 2023-12-06 DIAGNOSIS — F0390 Unspecified dementia without behavioral disturbance: Secondary | ICD-10-CM | POA: Insufficient documentation

## 2023-12-06 DIAGNOSIS — Z515 Encounter for palliative care: Secondary | ICD-10-CM | POA: Diagnosis not present

## 2023-12-06 DIAGNOSIS — S59901A Unspecified injury of right elbow, initial encounter: Secondary | ICD-10-CM | POA: Diagnosis present

## 2023-12-06 DIAGNOSIS — M25521 Pain in right elbow: Secondary | ICD-10-CM | POA: Diagnosis not present

## 2023-12-06 DIAGNOSIS — R0789 Other chest pain: Secondary | ICD-10-CM | POA: Diagnosis not present

## 2023-12-06 DIAGNOSIS — S42414A Nondisplaced simple supracondylar fracture without intercondylar fracture of right humerus, initial encounter for closed fracture: Secondary | ICD-10-CM | POA: Diagnosis not present

## 2023-12-06 DIAGNOSIS — R531 Weakness: Secondary | ICD-10-CM

## 2023-12-06 DIAGNOSIS — R079 Chest pain, unspecified: Secondary | ICD-10-CM | POA: Insufficient documentation

## 2023-12-06 DIAGNOSIS — S42414D Nondisplaced simple supracondylar fracture without intercondylar fracture of right humerus, subsequent encounter for fracture with routine healing: Secondary | ICD-10-CM | POA: Diagnosis not present

## 2023-12-06 DIAGNOSIS — W19XXXA Unspecified fall, initial encounter: Secondary | ICD-10-CM | POA: Diagnosis not present

## 2023-12-06 LAB — COMPREHENSIVE METABOLIC PANEL WITH GFR
ALT: 26 U/L (ref 0–44)
AST: 40 U/L (ref 15–41)
Albumin: 4.1 g/dL (ref 3.5–5.0)
Alkaline Phosphatase: 77 U/L (ref 38–126)
Anion gap: 13 (ref 5–15)
BUN: 27 mg/dL — ABNORMAL HIGH (ref 8–23)
CO2: 24 mmol/L (ref 22–32)
Calcium: 9.8 mg/dL (ref 8.9–10.3)
Chloride: 104 mmol/L (ref 98–111)
Creatinine, Ser: 0.74 mg/dL (ref 0.44–1.00)
GFR, Estimated: >60 mL/min (ref >60)
Glucose, Bld: 97 mg/dL (ref 70–99)
Potassium: 3.7 mmol/L (ref 3.5–5.1)
Sodium: 140 mmol/L (ref 135–145)
Total Bilirubin: 0.4 mg/dL (ref 0.0–1.2)
Total Protein: 7.5 g/dL (ref 6.5–8.1)

## 2023-12-06 LAB — CBC WITH DIFFERENTIAL/PLATELET
Abs Immature Granulocytes: 0.04 K/uL (ref 0.00–0.07)
Basophils Absolute: 0 K/uL (ref 0.0–0.1)
Basophils Relative: 1 %
Eosinophils Absolute: 0 K/uL (ref 0.0–0.5)
Eosinophils Relative: 1 %
HCT: 32.2 % — ABNORMAL LOW (ref 36.0–46.0)
Hemoglobin: 10.8 g/dL — ABNORMAL LOW (ref 12.0–15.0)
Immature Granulocytes: 1 %
Lymphocytes Relative: 14 %
Lymphs Abs: 1.2 K/uL (ref 0.7–4.0)
MCH: 26.1 pg (ref 26.0–34.0)
MCHC: 33.5 g/dL (ref 30.0–36.0)
MCV: 77.8 fL — ABNORMAL LOW (ref 80.0–100.0)
Monocytes Absolute: 0.4 K/uL (ref 0.1–1.0)
Monocytes Relative: 5 %
Neutro Abs: 6.7 K/uL (ref 1.7–7.7)
Neutrophils Relative %: 78 %
Platelets: 393 K/uL (ref 150–400)
RBC: 4.14 MIL/uL (ref 3.87–5.11)
RDW: 16.4 % — ABNORMAL HIGH (ref 11.5–15.5)
WBC: 8.5 K/uL (ref 4.0–10.5)
nRBC: 0 % (ref 0.0–0.2)

## 2023-12-06 LAB — TROPONIN T, HIGH SENSITIVITY: Troponin T High Sensitivity: 24 ng/L — ABNORMAL HIGH (ref 0–19)

## 2023-12-06 MED ORDER — HYDROCODONE-ACETAMINOPHEN 5-325 MG PO TABS: 1.0000 | ORAL_TABLET | Freq: Four times a day (QID) | ORAL | 0 refills | Status: AC | PRN

## 2023-12-06 MED ORDER — FENTANYL CITRATE PF 50 MCG/ML IJ SOSY
12.5000 ug | PREFILLED_SYRINGE | Freq: Once | INTRAMUSCULAR | Status: AC
  Administered 2023-12-06: 12.5 ug via INTRAVENOUS
  Filled 2023-12-06: qty 1

## 2023-12-06 NOTE — ED Triage Notes (Signed)
 89 y/o female comes in c/o chest pain that started about a year ago, per son, pt has been c/o of worsening chest pain for the past few days. Pt is alert to self, but confused to date, time and situation and unable to qualify or quantify her chest pain or the circumstances surrounding her er visit today

## 2023-12-06 NOTE — Discharge Instructions (Addendum)
 Return for any problem.   [facility] will contact you within 2 days of your discharge to set up home physical & occupational therapy.  [facility] will contact you regarding home palliative care and other services you may qualify for.

## 2023-12-06 NOTE — ED Provider Notes (Signed)
 [place] EMERGENCY DEPARTMENT AT [facility] Provider Note   CSN: [id number] Arrival date & time: 12/06/23  2012     Patient presents with: Chest Pain   Connie Snyder is a 89 y.o. female.   89 year old female with prior medical history as detailed below presents for evaluation.  She is accompanied by her son.  Her son provides all history.  Patient has significant dementia.  She was recently diagnosed with right elbow fracture.  The son brings her to the ED because he feels that her pain is not adequately controlled at home.  She was only advised to take Tylenol  for her right elbow fracture.  In triage, initial evaluation suggested possible element of chest pain.  When queried about the presence of chest pain -the patient responded that her right great toe was bothering her.  She denied chest pain.  The history is provided by the patient and medical records.       Prior to Admission medications   Medication Sig Start Date End Date Taking? Authorizing Provider  cyanocobalamin  1000 MCG tablet Take 1 tablet (1,000 mcg total) by mouth daily. 04/04/23   Regalado, Belkys A, MD  ELIQUIS  2.5 MG TABS tablet Take 2.5 mg by mouth 2 (two) times daily.    [provider]  feeding supplement (BOOST HIGH PROTEIN) LIQD Take 1 Container by mouth daily.    [provider]  ferrous sulfate  325 (65 FE) MG EC tablet Take 1 tablet (325 mg total) by mouth daily with breakfast. 04/04/23 11/30/23  Regalado, Belkys A, MD  folic acid  (FOLVITE ) 1 MG tablet Take 1 tablet (1 mg total) by mouth daily. 04/05/23   Regalado, Belkys A, MD  mirtazapine  (REMERON ) 15 MG tablet TAKE 1 TABLET BY MOUTH EVERY DAY AT BEDTIME FOR 90 DAYS for 90    [provider]  Multiple Vitamins-Minerals (MULTIVITAMIN WOMEN 50+) TABS Take 1 tablet by mouth daily with breakfast.    [provider]  Multiple Vitamins-Minerals (ZINC PO) Take 1 tablet by mouth daily.    [provider]   pantoprazole  (PROTONIX ) 40 MG tablet Take 1 tablet (40 mg total) by mouth 2 (two) times daily. 04/04/23   Regalado, Belkys A, MD  senna-docusate (SENOKOT-S) 8.6-50 MG tablet Take 1 tablet by mouth 2 (two) times daily. 04/04/23   Regalado, Belkys A, MD  thiamine  (VITAMIN B-1) 100 MG tablet Take 1 tablet (100 mg total) by mouth daily. 04/05/23   Regalado, Owen LABOR, MD    Allergies: Aricept  [place] ]    Review of Systems  All other systems reviewed and are negative.   Updated Vital Signs BP 114/88   Pulse 100   Temp 98.3 F (36.8 C)   Resp (!) 24   SpO2 95%   Physical Exam Vitals and nursing note reviewed.  Constitutional:      General: She is not in acute distress.    Appearance: Normal appearance. She is well-developed.  HENT:     Head: Normocephalic and atraumatic.  Eyes:     Conjunctiva/sclera: Conjunctivae normal.     Pupils: Pupils are equal, round, and reactive to light.  Cardiovascular:     Rate and Rhythm: Normal rate and regular rhythm.     Heart sounds: Normal heart sounds.  Pulmonary:     Effort: Pulmonary effort is normal. No respiratory distress.     Breath sounds: Normal breath sounds.  Abdominal:     General: There is no distension.  Palpations: Abdomen is soft.     Tenderness: There is no abdominal tenderness.  Musculoskeletal:        General: No deformity. Normal range of motion.     Cervical back: Normal range of motion and neck supple.     Comments: Right upper extremity is in splint.  Patient's son has removed the sling which was bothering his mother.  He was able to put a large sweatshirt onto her which has prevented her from picking at the splint material.  Distal right upper extremity is neurovasc intact.  Skin:    General: Skin is warm and dry.  Neurological:     General: No focal deficit present.     Mental Status: She is alert and oriented to person, place, and time.     (all labs ordered are listed, but only abnormal results are  displayed) Labs Reviewed  COMPREHENSIVE METABOLIC PANEL WITH GFR - Abnormal; Notable for the following components:      Result Value   BUN 27 (*)    All other components within normal limits  CBC WITH DIFFERENTIAL/PLATELET - Abnormal; Notable for the following components:   Hemoglobin 10.8 (*)    HCT 32.2 (*)    MCV 77.8 (*)    RDW 16.4 (*)    All other components within normal limits  TROPONIN T, HIGH SENSITIVITY - Abnormal; Notable for the following components:   Troponin T High Sensitivity 24 (*)    All other components within normal limits  TROPONIN T, HIGH SENSITIVITY    EKG: EKG Interpretation Date/Time:  Wednesday December 06 2023 20:26:08 EDT Ventricular Rate:  99 PR Interval:  150 QRS Duration:  113 QT Interval:  374 QTC Calculation: 480 R Axis:   -37  Text Interpretation: Sinus rhythm LVH with IVCD, LAD and secondary repol abnrm Anterior ST elevation, probably due to LVH Confirmed by Laurice Coy [phone]) on 12/06/2023 8:39:58 PM  Radiology: [facility] Chest Port 1 View Result Date: 12/06/2023 CLINICAL DATA:  Chest pain. EXAM: PORTABLE CHEST 1 VIEW COMPARISON:  Chest radiograph dated 11/15/2023. FINDINGS: No focal consolidation, pleural effusion or pneumothorax. The cardiac silhouette is within limits. No acute osseous pathology. Old left humeral neck fracture deformity. IMPRESSION: No active disease. Electronically Signed   By: Vanetta Chou M.D.   On: 12/06/2023 21:24     Procedures   Medications Ordered in the ED  fentaNYL  (SUBLIMAZE ) injection 12.5 mcg (12.5 mcg Intravenous Given 12/06/23 2139)    Clinical Course as of 12/22/23 2209  Thu Dec 07, 2023  0000 Fall 2 days ago. Supracondylar fracture. Demented. 2nd troponin pending. Anticipate TOC boarder. [CC]    Clinical Course User Index [CC] Jerral Meth, MD                                 Medical Decision Making Patient presents following fall.   Initial screening labs and imaging ordered.    Oncoming EDP aware of case.   Amount and/or Complexity of Data Reviewed Labs: ordered. Radiology: ordered.  Risk OTC drugs. Prescription drug management.        Final diagnoses:  Right elbow pain    ED Discharge Orders     None          Laurice Coy BROCKS, MD 12/22/23 2212

## 2023-12-06 NOTE — ED Notes (Signed)
 The patients son called very upset.  He states his mom came in with a broken arm and they did not prescribe her anything for pain.  They advised she takes tylenol .  He states she is in a lot of pain and wants something called in because she was referred to doctor Handy but they can't see her until next week and the tylenol  is not relieving the pain.  I explained we can't call anything in because the doctor that seen her is not here today.  I advised he brings her back in to be seen.

## 2023-12-07 LAB — TROPONIN T, HIGH SENSITIVITY: Troponin T High Sensitivity: 23 ng/L — ABNORMAL HIGH (ref 0–19)

## 2023-12-07 MED ORDER — VITAMIN B-12 1000 MCG PO TABS
1000.0000 ug | ORAL_TABLET | Freq: Every day | ORAL | Status: DC
  Administered 2023-12-07 – 2024-01-13 (×36): 1000 ug via ORAL
  Filled 2023-12-07 (×45): qty 1

## 2023-12-07 MED ORDER — APIXABAN 2.5 MG PO TABS
2.5000 mg | ORAL_TABLET | Freq: Two times a day (BID) | ORAL | Status: DC
  Administered 2023-12-08 – 2024-01-13 (×67): 2.5 mg via ORAL
  Filled 2023-12-07 (×83): qty 1

## 2023-12-07 MED ORDER — HYDROCODONE-ACETAMINOPHEN 5-325 MG PO TABS
1.0000 | ORAL_TABLET | Freq: Four times a day (QID) | ORAL | Status: DC | PRN
  Administered 2023-12-08 – 2024-01-02 (×10): 1 via ORAL
  Filled 2023-12-07 (×11): qty 1

## 2023-12-07 MED ORDER — IBUPROFEN 200 MG PO TABS
400.0000 mg | ORAL_TABLET | Freq: Four times a day (QID) | ORAL | Status: DC | PRN
  Administered 2023-12-07 – 2023-12-09 (×3): 400 mg via ORAL
  Filled 2023-12-07 (×3): qty 2

## 2023-12-07 MED ORDER — ENSURE MAX PROTEIN PO LIQD
11.0000 [oz_av] | Freq: Every day | ORAL | Status: DC
  Administered 2023-12-07 – 2023-12-09 (×3): 11 [oz_av] via ORAL
  Filled 2023-12-07 (×3): qty 330

## 2023-12-07 MED ORDER — BOOST HIGH PROTEIN PO LIQD
1.0000 | Freq: Every day | ORAL | Status: DC
  Filled 2023-12-07: qty 237

## 2023-12-07 NOTE — ED Notes (Signed)
 PT is unable to ambulate without assistance to the commode. Per son at bedside, pt uses a walker at baseline, but reports since she hurt her elbow, she has been refusing to ambulate at home.

## 2023-12-07 NOTE — NC FL2 (Signed)
 [place]  MEDICAID FL2 LEVEL OF CARE FORM     IDENTIFICATION  Patient Name: Connie Snyder Birthdate: 1927/10/10 Sex: female Admission Date (Current Location): 12/06/2023  [facility] and [place] Number:  Producer, television/film/video and Address:  [facility],  [address] [place], [place] [address]      Provider Number: [id number]  Attending Physician Name and Address:  Dasie Faden, MD  Relative Name and Phone Number:  Mercedes Fort / SON #[phone]    Current Level of Care: Hospital Recommended Level of Care: Skilled Nursing Facility Prior Approval Number:    Date Approved/Denied: 12/07/23 PASRR Number: [id number]  Discharge Plan: SNF    Current Diagnoses: Patient Active Problem List   Diagnosis Date Noted   Community acquired pneumonia of right upper lobe of lung 03/29/2023   Chest pain 06/03/2021   Syncope, vasovagal 06/03/2021   Aspiration pneumonia (HCC) 06/03/2021   History of pulmonary embolism 06/03/2021   Pulmonary emboli (HCC) 04/27/2021   Thigh hematoma 04/26/2021   Acute blood loss anemia 04/26/2021   Rheumatoid arthritis (HCC) 04/26/2021   Altered mental status 11/08/2018   Confusion    Vascular dementia without behavioral disturbance (HCC)    Normocytic normochromic anemia 12/22/2017   S/p left hip fracture 12/22/2017   Malnutrition of moderate degree 12/22/2017   Closed fracture of left hip (HCC) 12/21/2017   Closed fracture of left proximal humerus 11/30/2017    Orientation RESPIRATION BLADDER Height & Weight     Self  Normal Continent Weight:   Height:     BEHAVIORAL SYMPTOMS/MOOD NEUROLOGICAL BOWEL NUTRITION STATUS      Continent Diet  AMBULATORY STATUS COMMUNICATION OF NEEDS Skin   Limited Assist Verbally Normal                       Personal Care Assistance Level of Assistance  Bathing, Dressing, Feeding Bathing Assistance: Maximum assistance Feeding assistance: Limited assistance Dressing Assistance: Maximum  assistance     Functional Limitations Info  Sight, Hearing Sight Info: Adequate Hearing Info: Adequate      SPECIAL CARE FACTORS FREQUENCY  PT (By licensed PT), OT (By licensed OT)     PT Frequency: X5 OT Frequency: X5            Contractures Contractures Info: Not present    Additional Factors Info  Code Status, Allergies Code Status Info: FULL Allergies Info: Aricept  (donepezil )           Current Medications (12/07/2023):  This is the current hospital active medication list Current Facility-Administered Medications  Medication Dose Route Frequency Provider Last Rate Last Admin   apixaban  (ELIQUIS ) tablet 2.5 mg  2.5 mg Oral BID Patsey Lot, MD       cyanocobalamin  (VITAMIN B12) tablet 1,000 mcg  1,000 mcg Oral Daily Patsey Lot, MD   1,000 mcg at 12/07/23 1008   HYDROcodone -acetaminophen  (NORCO/VICODIN) 5-325 MG per tablet 1 tablet  1 tablet Oral Q6H PRN Patsey Lot, MD       ibuprofen  (ADVIL ) tablet 400 mg  400 mg Oral Q6H PRN Patsey Lot, MD   400 mg at 12/07/23 1008   protein supplement (ENSURE MAX) liquid  11 oz Oral Daily Patsey Lot, MD   11 oz at 12/07/23 1008   Current Outpatient Medications  Medication Sig Dispense Refill   HYDROcodone -acetaminophen  (NORCO/VICODIN) 5-325 MG tablet Take 1 tablet by mouth every 6 (six) hours as needed. 10 tablet 0   hydrOXYzine  (ATARAX ) 25 MG tablet Take  25 mg by mouth daily as needed.     cyanocobalamin  1000 MCG tablet Take 1 tablet (1,000 mcg total) by mouth daily. 30 tablet 0   ELIQUIS  2.5 MG TABS tablet Take 2.5 mg by mouth 2 (two) times daily.     feeding supplement (BOOST HIGH PROTEIN) LIQD Take 1 Container by mouth daily.     ferrous sulfate  325 (65 FE) MG EC tablet Take 1 tablet (325 mg total) by mouth daily with breakfast. 60 tablet 3   folic acid  (FOLVITE ) 1 MG tablet Take 1 tablet (1 mg total) by mouth daily. 30 tablet 0   mirtazapine  (REMERON ) 15 MG tablet TAKE 1 TABLET BY MOUTH EVERY  DAY AT BEDTIME FOR 90 DAYS for 90     Multiple Vitamins-Minerals (MULTIVITAMIN WOMEN 50+) TABS Take 1 tablet by mouth daily with breakfast.     Multiple Vitamins-Minerals (ZINC PO) Take 1 tablet by mouth daily.     pantoprazole  (PROTONIX ) 40 MG tablet Take 1 tablet (40 mg total) by mouth 2 (two) times daily. 60 tablet 0   senna-docusate (SENOKOT-S) 8.6-50 MG tablet Take 1 tablet by mouth 2 (two) times daily. 60 tablet 0   thiamine  (VITAMIN B-1) 100 MG tablet Take 1 tablet (100 mg total) by mouth daily. 30 tablet 0     Discharge Medications: Please see discharge summary for a list of discharge medications.  Relevant Imaging Results:  Relevant Lab Results:   Additional Information SSN: [id number]  Donnice LELON Favor, LCSW

## 2023-12-07 NOTE — ED Notes (Signed)
 After conversation with Dr. Dasie and Dr. Bari verbal order to discontinue cardiac monitoring was obtained and it is ok to move pt to non-monitored bed in TCU pt remains a full code.     Call to pharmacy r/t Eliquis  , questions whether  pt remains on medication and they have reached out to family to verify. Med is to be held until verification is obtained.

## 2023-12-07 NOTE — ED Provider Notes (Signed)
  Physical Exam  BP (!) 158/100 (BP Location: Left Arm)   Pulse 95   Temp 98.3 F (36.8 C) (Oral)   Resp 18   SpO2 99%   Physical Exam  Procedures  Procedures  ED Course / MDM   Clinical Course as of 12/07/23 0729  Thu Dec 07, 2023  0000 Fall 2 days ago. Supracondylar fracture. Demented. 2nd troponin pending. Anticipate TOC boarder. [CC]    Clinical Course User Index [CC] Jerral Meth, MD   Medical Decision Making Amount and/or Complexity of Data Reviewed Labs: ordered. Radiology: ordered.  Risk Prescription drug management.   Dementia with fall and fracture 2 days ago.  Worsening dementia.  Decompensation.  Pending PT eval and treat and possible placement.       Patsey Lot, MD 12/07/23 [phone]

## 2023-12-07 NOTE — Progress Notes (Signed)
 CSW has initiated SNF search .TOC will follow up with offers.     [place] Samin Milke LCSW-A   12/07/2023 6:10 PM

## 2023-12-07 NOTE — ED Notes (Signed)
 Pt is awake, watching TV and in no acute distress. PT is clean and dry. PT given something to drink, but declines wanting anything to eat. Dispo pending

## 2023-12-07 NOTE — ED Provider Notes (Signed)
 Care of patient received from prior provider at 1:25 AM, please see their note for complete H/P and care plan.  Received handoff per ED course.  Clinical Course as of 12/07/23 0125  Thu Dec 07, 2023  0000 Fall 2 days ago. Supracondylar fracture. Demented. 2nd troponin pending. Anticipate TOC boarder. [CC]    Clinical Course User Index [CC] Jerral Meth, MD    Reassessment: Patient evaluated at bedside.  Completely unable to ambulate.  Very poor p.o. intake since her fall.  She appears to be having decompensation of both her cognitive and mobility status.  Family does not feel comfortable with her care at home given her ongoing failure to thrive. She does not meet any criteria for medical admission based on reviewed evaluation per prior provider. Family is requesting physical therapy evaluation and consideration for nursing assistance either at home nursing care or skilled nursing care.  Has needed skilled nursing care in the past after other falls.     Jerral Meth, MD 12/07/23 [phone]

## 2023-12-07 NOTE — Progress Notes (Signed)
 Physical Therapy Treatment Patient Details Name: Connie Snyder MRN: [id number] DOB: 12/29/1927 Today's Date: 12/07/2023   History of Present Illness 89 yo female brouight to ED 12/06/23 from home with RUE pain, then reported chest pain. Patient recentlly diagnosed with right  olecranon fracture,  son reports inability to ambulate, poor PO intake/ FTT. PMH: PE, THA    PT Comments  Patient's son, Jerona, present to provide more details of PLOF. Patient was ambulatory and able to dress self, feed self and was home alone at intervals prior to fall from chair and fracture of olecranon on 8/27. Son feels the right elbow pain is contributing to patient's change if MS and function. Patient continues to posture with head to  and trunk  to left laterally  and head rotated to the left.   Patient assisted to  sitting with total assistance of 2. Once sitting , noted improved  posture, head more erect. Intermittently able to self support in sitting. Attempted to stand with patient  posturing into extension so returned to supine quickly.  Patient  gretel piedmont is  jargon, still speaking about children and  says STOP when moving patient in bed.  Patient will benefit from continued inpatient follow up therapy, <3 hours/day  Son reports that he would like to be available when possible  when patient has therapy.    If plan is discharge home, recommend the following: Two people to help with walking and/or transfers;Two people to help with bathing/dressing/bathroom;Assistance with feeding   Can travel by private vehicle        Equipment Recommendations  None recommended by PT    Recommendations for Other Services       Precautions / Restrictions Precautions Precautions: Fall Recall of Precautions/Restrictions: Impaired Required Braces or Orthoses: Splint/Cast Splint/Cast: R UE posterior spint Restrictions Weight Bearing Restrictions Per Provider Order: No     Mobility  Bed Mobility Overal bed  mobility: Needs Assistance Bed Mobility: Supine to Sit, Sit to Supine     Supine to sit: Total assist, +2 for physical assistance Sit to supine: +2 for physical assistance, Total assist   General bed mobility comments: son present to assist with mobilizing  patient to sitting, max support. then total assist as patient was starting to extend legs and slide   forward.  totla to roll to each side    Transfers Overall transfer level: Needs assistance Equipment used: 2 person hand held assist Transfers: Sit to/from Stand Sit to Stand: From elevated surface, Total assist, +2 physical assistance, +2 safety/equipment           General transfer comment: briefly stood  but  patient began to   stiffen trunk and legs and slide forward. so lifted back onto  stretcher    Ambulation/Gait                   Stairs             Wheelchair Mobility     Tilt Bed    Modified Rankin (Stroke Patients Only)       Balance Overall balance assessment: Needs assistance, History of Falls Sitting-balance support: Feet unsupported, Single extremity supported Sitting balance-Leahy Scale: Zero Sitting balance - Comments: patient with lead tilting to  the  left which did improve  with sitting upright on stretcher with manual repositioning. patient did require continuous support of trunk and head. Postural control: Left lateral lean Standing balance support: Bilateral upper extremity supported Standing balance-Leahy Scale: Zero  Communication Communication Communication: No apparent difficulties Factors Affecting Communication:  (speaks clearly but not sensible,)  Cognition Arousal: Alert Behavior During Therapy: Anxious, Restless   PT - Cognitive impairments: Orientation, Awareness, Attention   Orientation impairments: Place, Time, Situation                   PT - Cognition Comments: son present, reports that until  8/27 after  right elb injury, patient was able to communicate , knew family, able to dress self, toilet self and ambulated with Rw. Reports since 8/31, stopped ambulating.. 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 Following commands: Impaired      Cueing Cueing Techniques: Tactile cues, Gestural cues  Exercises      General Comments        Pertinent Vitals/Pain Pain Assessment Pain Assessment: PAINAD Breathing: occasional labored breathing, short period of hyperventilation Negative Vocalization: occasional moan/groan, low speech, negative/disapproving quality Facial Expression: sad, frightened, frown Body Language: tense, distressed pacing, fidgeting Consolability: distracted or reassured by voice/touch PAINAD Score: 5    Home Living                          Prior Function            PT Goals (current goals can now be found in the care plan section) Progress towards PT goals: Progressing toward goals    Frequency    Min 2X/week      PT Plan      Co-evaluation              AM-PAC PT 6 Clicks Mobility   Outcome Measure  Help needed turning from your back to your side while in a flat bed without using bedrails?: Total Help needed moving from lying on your back to sitting on the side of a flat bed without using bedrails?: Total Help needed moving to and from a bed to a chair (including a wheelchair)?: Total Help needed standing up from a chair using your arms (e.g., wheelchair or bedside chair)?: Total Help needed to walk in hospital room?: Total Help needed climbing 3-5 steps with a railing? : Total 6 Click Score: 6    End of Session   Activity Tolerance: Patient tolerated treatment well Patient left: in bed;with call bell/phone within reach;with family/visitor present Nurse Communication: Mobility status PT Visit Diagnosis: Other abnormalities of gait and mobility (R26.89);Adult, failure to thrive (R62.7)     Time: 8454-8393 PT Time  Calculation (min) (ACUTE ONLY): 21 min  Charges:    $Therapeutic Activity: 8-22 mins PT General Charges $$ ACUTE PT VISIT: 1 Visit                     Darice Potters PT Acute Rehabilitation Services Office [phone]   [name] 12/07/2023, 4:19 PM

## 2023-12-07 NOTE — Evaluation (Signed)
 Physical Therapy Evaluation Patient Details Name: Connie Snyder MRN: [id number] DOB: Jul 05, 1927 Today's Date: 12/07/2023  History of Present Illness  89 yo female brouight to ED 12/06/23 from home with RUE pain, then reported chest pain. Patient recenetly diagnosed with right  olecranon fracture,  son reports inability to ambulate, poor PO intake/ FTT. PMH: PE, THA  Clinical Impression  Pt admitted with above diagnosis.  Pt currently with functional limitations due to the deficits listed below (see PT Problem List). Pt will benefit from acute skilled PT to increase their independence and safety with mobility to allow discharge.     Patient  received in bed , body positioned diagonal in bed, feet hanging off  right edge, head on left rail. Patient resists any attempts to reposition in bed, States  STOP, don't you know what that means?  Patient indicating seeing  people on ceiling, giving  instructions to children to clean up their desks.  No family present. Patient oriented to self.  Appears patient was ambulatory with a RW recently until right  elbow injury.   Patient will benefit from continued inpatient follow up therapy, <3 hours/day.       If plan is discharge home, recommend the following: Two people to help with walking and/or transfers;Two people to help with bathing/dressing/bathroom;Assistance with feeding   Can travel by private vehicle        Equipment Recommendations None recommended by PT  Recommendations for Other Services       Functional Status Assessment Patient has had a recent decline in their functional status and demonstrates the ability to make significant improvements in function in a reasonable and predictable amount of time.     Precautions / Restrictions Precautions Precautions: Fall Recall of Precautions/Restrictions: Impaired Restrictions Weight Bearing Restrictions Per Provider Order: No      Mobility  Bed Mobility               General  bed mobility comments: pt  resists  any  repositioning efforts.    Transfers                        Ambulation/Gait                  Stairs            Wheelchair Mobility     Tilt Bed    Modified Rankin (Stroke Patients Only)       Balance                                             Pertinent Vitals/Pain Pain Assessment Pain Assessment: PAINAD Breathing: occasional labored breathing, short period of hyperventilation Negative Vocalization: occasional moan/groan, low speech, negative/disapproving quality Facial Expression: sad, frightened, frown Body Language: tense, distressed pacing, fidgeting Consolability: no need to console PAINAD Score: 4    Home Living Family/patient expects to be discharged to:: Private residence Living Arrangements: Children Available Help at Discharge: Family Type of Home: House Home Access: Stairs to enter Entrance Stairs-Rails: Right;Left     Home Layout: One level Home Equipment: Agricultural consultant (2 wheels);Cane - single point Additional Comments: patient unable to provide information, info from per previous encounter    Prior Function               Mobility Comments: per chart, ambulatory until recent  R elbow injury       Extremity/Trunk Assessment   Upper Extremity Assessment Upper Extremity Assessment: RUE deficits/detail RUE Deficits / Details: in posterior long arm  splint.    Lower Extremity Assessment Lower Extremity Assessment:  (legs over bed edge to right, does lift legs in response to therapists touching legs, withdrawal)    Cervical / Trunk Assessment Cervical / Trunk Assessment: Other exceptions Cervical / Trunk Exceptions: pt. in bed , trunk positioned  to left of bed, head against bed rail, body diagonal on bed.  patient resisitive to any repositioning.  Communication   Communication Communication: No apparent difficulties    Cognition Arousal:  Alert Behavior During Therapy: Agitated   PT - Cognitive impairments: History of cognitive impairments, Orientation, No family/caregiver present to determine baseline                       PT - Cognition Comments: patient not followng directions, gets very anxious and verbally aggressive with any attempts for reposiitoning, or movements of her extremities, becomes resistive in her body, tenses up. patient appears to be seeing  people on ceiling, stating,' Come down from there, you might fall.  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 Following commands: Impaired       Cueing Cueing Techniques: Tactile cues, Gestural cues     General Comments General comments (skin integrity, edema, etc.): unable to test    Exercises     Assessment/Plan    PT Assessment Patient needs continued PT services  PT Problem List Decreased cognition;Decreased range of motion;Decreased activity tolerance;Decreased mobility       PT Treatment Interventions Functional mobility training;Therapeutic activities;Patient/family education;Cognitive remediation    PT Goals (Current goals can be found in the Care Plan section)  Acute Rehab PT Goals PT Goal Formulation: Patient unable to participate in goal setting Time For Goal Achievement: 12/21/23 Potential to Achieve Goals: Poor    Frequency Min 2X/week     Co-evaluation               AM-PAC PT 6 Clicks Mobility  Outcome Measure Help needed turning from your back to your side while in a flat bed without using bedrails?: Total Help needed moving from lying on your back to sitting on the side of a flat bed without using bedrails?: Total Help needed moving to and from a bed to a chair (including a wheelchair)?: Total Help needed standing up from a chair using your arms (e.g., wheelchair or bedside chair)?: Total Help  needed to walk in hospital room?: Total Help needed climbing 3-5 steps with a railing? : Total 6 Click Score: 6    End of Session   Activity Tolerance: Treatment limited secondary to agitation Patient left: in bed;with call bell/phone within reach Nurse Communication: Mobility status PT Visit Diagnosis: Other abnormalities of gait and mobility (R26.89);Adult, failure to thrive (R62.7)    Time: 0820-0841 PT Time Calculation (min) (ACUTE ONLY): 21 min   Charges:   PT Evaluation $PT Eval Low Complexity: 1 Low   PT General Charges $$ ACUTE PT VISIT: 1 Visit         Darice Potters PT Acute Rehabilitation Services Office [phone]   [name] 12/07/2023, 9:03 AM

## 2023-12-07 NOTE — Progress Notes (Addendum)
 Awaiting PT.   Addend @ 9:28AM Pt did not participate with PT. CSW contacted pt's son to inquire about whether he could come to the hospital and be present while PT is attempting to work with pt. Connie Snyder reported he will be able to visit after 12 noon.

## 2023-12-08 NOTE — ED Notes (Signed)
 Patient has been alert this shift but has been sleeping during the shift. Patient has been able to answer questions. Patient did not eat breakfast or lunch. Patient only ate a couple bits of dinner.  Patient did drink an Ensure. Patient was medication compliant

## 2023-12-08 NOTE — ED Notes (Signed)
 Woke pt up to offer breakfast and pt stated Not right now, I just want to rest I had a long day yesterday. Will try to offer food again later. Pt resting comfortably.

## 2023-12-08 NOTE — ED Notes (Signed)
 Pt sleeping comfortably, will offer breakfast when pt is more awake.

## 2023-12-08 NOTE — Progress Notes (Addendum)
 OT Cancellation Note  Patient Details Name: Connie Snyder MRN: [id number] DOB: [birth year]   Cancelled Treatment:    Reason Eval/Treat Not Completed: Fatigue/lethargy limiting ability to participate. Orders received, chart reviewed. Pt asleep in bed, no family present. Per RN, son to return around noon. Notes indicate son would like to be present for therapy sessions due to pt's mental status. OT will return as able.  Addendum: OT returned around 1220, pt asleep & no family present, no answer with attempt to call son. OT will continue to attempt.   Chaia Ikard L. Kalyssa Anker, OTR/L  12/08/23, 10:10 AM

## 2023-12-08 NOTE — Progress Notes (Addendum)
 Bed offers pending.  Addend @ 8:52AM CSW presented bed offers to pt's son. He chose [facility]. Kia notified and she confirmed bed. Auth pending for [facility].

## 2023-12-08 NOTE — ED Provider Notes (Signed)
 Emergency Medicine Observation Re-evaluation Note  Connie Snyder is a 89 y.o. female, seen on rounds today.  Pt initially presented to the ED for complaints of Chest Pain Currently, the patient is asleep.  Nursing staff have no complaints. It appears that patient has been authorized to be transferred to [facility] health care. Patient had come into the ER with a fall and was found to have fracture.  Physical Exam  BP 133/89 (BP Location: Left Arm)   Pulse 64   Temp 98.1 F (36.7 C)   Resp 14   SpO2 100%  Physical Exam General: Asleep, no distress   ED Course / MDM  EKG:EKG Interpretation Date/Time:  Wednesday December 06 2023 20:26:08 EDT Ventricular Rate:  99 PR Interval:  150 QRS Duration:  113 QT Interval:  374 QTC Calculation: 480 R Axis:   -37  Text Interpretation: Sinus rhythm LVH with IVCD, LAD and secondary repol abnrm Anterior ST elevation, probably due to LVH Confirmed by Laurice Coy [phone]) on 12/06/2023 8:39:58 PM  I have reviewed the labs performed to date as well as medications administered while in observation.  Recent changes in the last 24 hours include -no new changes, besides patient has been accepted by facility.  Plan  Current plan is for holding patient until transferred to a facility.    Charlyn Sora, MD 12/08/23 [phone]

## 2023-12-08 NOTE — ED Notes (Signed)
 Performed peri care to pt, repositioned, and offered food but pt declined at the moment stated later. Beth, RN and this Tech encouraged pt to drink water and she drank half a cup. Pt resting comfortably listening to TV.

## 2023-12-09 ENCOUNTER — Encounter (HOSPITAL_COMMUNITY): Payer: Self-pay

## 2023-12-09 MED ORDER — ENSURE MAX PROTEIN PO LIQD
11.0000 [oz_av] | Freq: Two times a day (BID) | ORAL | Status: DC
  Administered 2023-12-10: 5 [oz_av] via ORAL
  Administered 2023-12-11 – 2024-01-13 (×56): 11 [oz_av] via ORAL
  Filled 2023-12-09 (×71): qty 330

## 2023-12-09 MED ORDER — HYDROXYZINE HCL 25 MG PO TABS
25.0000 mg | ORAL_TABLET | Freq: Every day | ORAL | Status: DC | PRN
  Administered 2023-12-09 – 2024-01-04 (×7): 25 mg via ORAL
  Filled 2023-12-09 (×7): qty 1

## 2023-12-09 MED ORDER — MIRTAZAPINE 7.5 MG PO TABS
15.0000 mg | ORAL_TABLET | Freq: Every day | ORAL | Status: DC
  Administered 2023-12-09 – 2024-01-12 (×32): 15 mg via ORAL
  Filled 2023-12-09 (×35): qty 2

## 2023-12-09 NOTE — ED Notes (Signed)
 Patient is alert and interacting with son.  Patient is eating and drinking.

## 2023-12-09 NOTE — ED Provider Notes (Addendum)
 Emergency Medicine Observation Re-evaluation Note  Connie Snyder is a 89 y.o. female, seen on rounds today.  Pt initially presented to the ED for complaints of Chest Pain Currently, the patient is resting comfortably asleep without acute distress.  Physical Exam  BP 132/77 (BP Location: Left Arm)   Pulse 70   Temp 98 F (36.7 C) (Axillary)   Resp 19   SpO2 97%  Physical Exam General: Resting without agitation Cardiac: Not tachycardic on last vital signs Lungs: Symmetric rise and fall of chest without respiratory distress Psych: Agitation at this time  ED Course / MDM  EKG:EKG Interpretation Date/Time:  Wednesday December 06 2023 20:26:08 EDT Ventricular Rate:  99 PR Interval:  150 QRS Duration:  113 QT Interval:  374 QTC Calculation: 480 R Axis:   -37  Text Interpretation: Sinus rhythm LVH with IVCD, LAD and secondary repol abnrm Anterior ST elevation, probably due to LVH Confirmed by Laurice Coy [phone]) on 12/06/2023 8:39:58 PM  I have reviewed the labs performed to date as well as medications administered while in observation.  Recent changes in the last 24 hours include none reported by overnight nursing.  Plan  Current plan is for awaiting placement.    Tannar Broker, Lonni JINNY, MD 12/09/23 0740   1:29 PM Social worker requested I do a face-to-face call with the insurance company to discuss the patient's physical therapy recommendations.  Physical therapy documented in their note that they feel she would benefit from acute skilled PT to increase independence and safety with mobility to allow discharge.  Will attempt to call and have this discussion after I reassessed the patient this morning.  2:52 PM Attempted to call the number [phone] and select option 5.  I attempted to call multiple times and was put on hold.  Will pass along to oncoming team and see if they can call to help facilitate this face-to-face.        Thelia Tanksley, Lonni JINNY,  MD 12/09/23 [phone]

## 2023-12-09 NOTE — Progress Notes (Signed)
 OT Cancellation Note  Patient Details Name: Connie Snyder MRN: [id number] DOB: [birth year]   Cancelled Treatment:    Reason Eval/Treat Not Completed: Other (comment) Spoke with LCSW who reports pt's insurance has not requested OT eval during admission. PT has evaluated pt this admission and is recommending SNF. OT will defer eval to SNF. Thank you for referral, OT will sign off at this time.   [facility] OTR/L Acute Rehabilitation Services Office: [phone]   Connie Snyder 12/09/2023, 11:00 AM

## 2023-12-09 NOTE — Progress Notes (Addendum)
 Shara is still pending.   Addend @ 12:52PM CSW received call from [facility] that Peer to Peer is being offered.   Deadline: Monday, 9/8 at 12PM Provider must call [phone], opt 5 and have name, DOB, and Member ID. EDP notified via secure chat.  Addend @ 2:51PM CSW discussed case with Dr. Ginger via phone and requested peer to peer be done today versus pt waiting 2 more days until deadline of Monday. He reported he would pop in and see pt and call for peer to peer.   Per Dr. Ginger- he has been on hold and will soon sign out to Dr. Garrick and will request that he follow up.

## 2023-12-09 NOTE — ED Notes (Signed)
 Patient has been alert this AM but resting comfortably, breaths equal and unlabored. Patient was medication compliant. Patient did eat several bites for breakfast and drink some ENSURE

## 2023-12-10 ENCOUNTER — Other Ambulatory Visit: Payer: Self-pay

## 2023-12-10 MED ORDER — ACETAMINOPHEN 325 MG PO TABS
650.0000 mg | ORAL_TABLET | Freq: Three times a day (TID) | ORAL | Status: DC | PRN
  Administered 2023-12-15 – 2024-01-02 (×9): 650 mg via ORAL
  Filled 2023-12-10 (×9): qty 2

## 2023-12-10 NOTE — ED Notes (Addendum)
 Pt's son and nephew came to visit pt today. They had a pleasant visit.

## 2023-12-10 NOTE — Progress Notes (Addendum)
 EDP requested Member ID: CSW provided. Awaiting outcome of peer to peer.   Addend @ 3:39PM All EDPs have been on hold since attempting to call for Peer to Peer. Will inquire about AM EDP completing. Last EDP documented was awaiting a return call.

## 2023-12-10 NOTE — ED Provider Notes (Addendum)
  Physical Exam  BP (!) 144/89 (BP Location: Left Arm)   Pulse 64   Temp 97.7 F (36.5 C) (Axillary)   Resp 18   SpO2 96%   Physical Exam  Procedures  Procedures  ED Course / MDM   Clinical Course as of 12/10/23 0723  Thu Dec 07, 2023  0000 Fall 2 days ago. Supracondylar fracture. Demented. 2nd troponin pending. Anticipate TOC boarder. [CC]    Clinical Course User Index [CC] Jerral Meth, MD   Medical Decision Making Amount and/or Complexity of Data Reviewed Labs: ordered. Radiology: ordered.  Risk OTC drugs. Prescription drug management.   Patient pending placement.  Peer to peer requested but attempted yesterday and was on hold too long.  Will attempt to see if this has been done or potentially call on it.  Attempted to call peer to peer however went to voicemail.  Peer to peer was called back, however do not have access to the member ID at this time and they cannot take a call because of that.  Will attempt to find that.  Member ID [id number]   10:44 AM  Attempted to call again however was on hold and reportedly will be called back.   Patsey Lot, MD 12/10/23 9275    Patsey Lot, MD 12/10/23 9271    Patsey Lot, MD 12/10/23 1438

## 2023-12-10 NOTE — ED Notes (Signed)
 Pt has received oral care along with total bed bath. Linens and gown have been changed.  New purewick has been applied as well.

## 2023-12-11 NOTE — Progress Notes (Signed)
 PT Cancellation Note  Patient Details Name: Connie Snyder MRN: [id number] DOB: Dec 06, 1927   Cancelled Treatment:    Reason Eval/Treat Not Completed: Patient declined, no reason specified  Patient did not participate in PT efforts to mobilize. Patient stated, sit down, then non communicative with therapist.  Darice Potters PT Acute Rehabilitation Services Office [phone] [name] 12/11/2023, 10:30 AM

## 2023-12-11 NOTE — Progress Notes (Addendum)
 CSW requested EDP complete peer to peer. Info sent via secure chat. Informed that all the weekend docs were on hold.   Addend @ 8:51AM Per EDP, they would like a new PT eval + nursing notes. PT notified via secure chat.   Addend @ 10:29AM Per PT, pt would not allow them to touch her and was not willing to participate. CSW attempted to contact pt's son, unable to leave voicemail. Will try back.   Addend @ 11:21AM CSW spoke with son who reports he will come to the ER to encourage pt to participate in therapy. CSW informed son that if insurance denies, he does have the right to appeal; however, if the appeal is upheld, team will arrange home health and pt will be discharged home. Son expressed concern with pt not being able to walk and CSW informed if insurance declined, that would mean they are not paying for SNF and the only other option is to private pay Son stated they could not afford it. CSW asked son to come to the ER as soon as possible as the deadline to upload the new PT eval is 1pm. Son reported he is on his way. PT notified via secure chat.

## 2023-12-11 NOTE — Progress Notes (Addendum)
 CSW faxed updated PT eval and nursing notes to [facility].   Addend @ 6:35PM Auth is still pending.

## 2023-12-11 NOTE — ED Notes (Signed)
Patient sleeping comfortably at this time. Sitter at bedside.  °

## 2023-12-11 NOTE — Progress Notes (Signed)
 Physical Therapy Treatment Patient Details Name: Connie Snyder MRN: [id number] DOB: 02-11-1928 Today's Date: 12/11/2023   History of Present Illness 89 yo female brouight to ED 12/06/23 from home with RUE pain, then reported chest pain. Patient recentlly diagnosed with right  olecranon fracture,  son reports inability to ambulate, poor PO intake/ FTT. PMH: PE, THA    PT Comments   Patient's son present, patient  performing leg  lifts upon arrival . Patient knows who son is and does follow his directions. Assisted  patient to sitting onto bed edge, noted RUE discomfort with any movement, asking to not be touched in the RUE. The patient sat on bed edge x ~ 10, initially required max support, progressing to close guarding. Attempted  standing at [facility] with LUE support for familiarity as pt used RW PTA.  Patient did not come to stand.  Patient does participate more when son present. So will engage son next visit.    Patient will benefit from continued inpatient follow up therapy, <3 hours/day    If plan is discharge home, recommend the following: Two people to help with walking and/or transfers;Two people to help with bathing/dressing/bathroom;Assistance with feeding   Can travel by private Theme park manager cushion (measurements PT);Wheelchair (measurements PT)    Recommendations for Other Services       Precautions / Restrictions Precautions Precautions: None Recall of Precautions/Restrictions: Impaired Required Braces or Orthoses: Splint/Cast Splint/Cast: R UE posterior spint Restrictions Weight Bearing Restrictions Per Provider Order: Yes RUE Weight Bearing Per Provider Order: Non weight bearing     Mobility  Bed Mobility Overal bed mobility: Needs Assistance Bed Mobility: Supine to Sit, Sit to Supine     Supine to sit: Max assist, +2 for safety/equipment, +2 for physical assistance, HOB elevated Sit to supine: Total assist, +2 for  safety/equipment, +2 for physical assistance   General bed mobility comments: son present to give patient dirctions for mobility, moving legs to bed edge, patient  required max support to move to sitting, indicating pain on the right UE. Assisted back to supne with legs and trunk    Transfers                   General transfer comment: attempts to stand at Rw with LUE suport, patient tends to start leaning back with attempts, did not get to stand at Rw with 1 UE support this visit.    Ambulation/Gait                   Stairs             Wheelchair Mobility     Tilt Bed    Modified Rankin (Stroke Patients Only)       Balance Overall balance assessment: Needs assistance Sitting-balance support: Feet supported, No upper extremity supported   Sitting balance - Comments: initally posterior lean progressing to  close guard, patient supporting self with no UE support. patient sat on bed edge x ~ 10 mins. Patient startin to lean posterior with fatigue and  increased anxiety.                                    Communication Communication Communication: No apparent difficulties  Cognition Arousal: Alert Behavior During Therapy: Anxious, Restless   PT - Cognitive impairments: Orientation, Awareness, Attention   Orientation impairments: Place,  Time, Situation                   PT - Cognition Comments: son present, patient knows son, patient was participating in  leg exercises directed by son, patient   stating that she wansts to go home with son. Patient did allow therapist and son tpo assist patient to sitting onto bed edge. Following commands: Impaired Following commands impaired: Follows one step commands inconsistently (was following sons directions initially)    Cueing Cueing Techniques: Verbal cues  Exercises      General Comments        Pertinent Vitals/Pain Pain Assessment Pain Assessment: PAINAD Breathing: occasional  labored breathing, short period of hyperventilation Negative Vocalization: occasional moan/groan, low speech, negative/disapproving quality Facial Expression: sad, frightened, frown Body Language: tense, distressed pacing, fidgeting Consolability: distracted or reassured by voice/touch PAINAD Score: 5    Home Living                          Prior Function            PT Goals (current goals can now be found in the care plan section) Progress towards PT goals: Progressing toward goals    Frequency    Min 2X/week      PT Plan      Co-evaluation              AM-PAC PT 6 Clicks Mobility   Outcome Measure  Help needed turning from your back to your side while in a flat bed without using bedrails?: Total Help needed moving from lying on your back to sitting on the side of a flat bed without using bedrails?: Total Help needed moving to and from a bed to a chair (including a wheelchair)?: Total Help needed standing up from a chair using your arms (e.g., wheelchair or bedside chair)?: Total Help needed to walk in hospital room?: Total Help needed climbing 3-5 steps with a railing? : Total 6 Click Score: 6    End of Session     Patient left: in bed;with call bell/phone within reach;with family/visitor present Nurse Communication: Mobility status PT Visit Diagnosis: Other abnormalities of gait and mobility (R26.89);Adult, failure to thrive (R62.7)     Time: 1115-1200 PT Time Calculation (min) (ACUTE ONLY): 45 min  Charges:    $Therapeutic Activity: 38-52 mins PT General Charges $$ ACUTE PT VISIT: 1 Visit                     Darice Potters PT Acute Rehabilitation Services Office [phone]    [name] 12/11/2023, 12:14 PM

## 2023-12-11 NOTE — ED Provider Notes (Signed)
 Emergency Medicine Observation Re-evaluation Note  Connie Snyder is a 89 y.o. female, seen on rounds today.  Pt initially presented to the ED for complaints of Chest Pain Currently, the patient is resting comfortably in bed.  Physical Exam  BP 116/69 (BP Location: Left Arm)   Pulse 63   Temp 97.8 F (36.6 C) (Oral)   Resp 18   SpO2 99%  Physical Exam   ED Course / MDM  EKG:EKG Interpretation Date/Time:  Wednesday December 06 2023 20:26:08 EDT Ventricular Rate:  99 PR Interval:  150 QRS Duration:  113 QT Interval:  374 QTC Calculation: 480 R Axis:   -37  Text Interpretation: Sinus rhythm LVH with IVCD, LAD and secondary repol abnrm Anterior ST elevation, probably due to LVH Confirmed by Laurice Coy [phone]) on 12/06/2023 8:39:58 PM  I have reviewed the labs performed to date as well as medications administered while in observation.  Recent changes in the last 24 hours include nothing.  Plan  Current plan is for hopefully SNF placement.  Did peer to peer call today and they request a repeat PT evaluation.  They will look at those notes and make decision about SNF placement.  Discussed with ED care management team and if SNF placement is denied, patient will need to go home with home health.    Dasie Faden, MD 12/11/23 [phone]

## 2023-12-12 NOTE — Progress Notes (Addendum)
 Shara is still pending.   Addend @ 10:35AM Auth denied. CSW attempted to contact pt's son to inform and provide appeal information. No answer - unable to leave vm.   Addend @ 11:13AM CSW attempted to contact pt's son again. No answer- unable to leave vm.  Addend @ 12:47PM CSW provided son with fast track appeal information. Son informed that it must be started today so that turnaround time is 72 hours. Son is also aware that if appeal is upheld, pt will need to return home with home health.  [phone]. Plan Auth ID: [id number].  Addend @ 3:16PM Fast track appeal started. CSW faxed formal request. Reference #: [id number]. Spoke to [employer]. Son aware. Kia at [facility] aware.

## 2023-12-12 NOTE — ED Provider Notes (Signed)
 Emergency Medicine Observation Re-evaluation Note  Connie Snyder is a 89 y.o. female, seen on rounds today.  Pt initially presented to the ED for complaints of Chest Pain Currently, the patient is sleeping on my evaluation.  Physical Exam  BP 107/64 (BP Location: Left Arm)   Pulse 66   Temp (!) 97.5 F (36.4 C)   Resp 18   SpO2 98%  Physical Exam General: No acute distress Cardiac: Regular rate and rhythm normal blood pressure Lungs: No distress normal respiratory rate normal oxygen saturations Psych: Sleeping at this time  ED Course / MDM  EKG:EKG Interpretation Date/Time:  Wednesday December 06 2023 20:26:08 EDT Ventricular Rate:  99 PR Interval:  150 QRS Duration:  113 QT Interval:  374 QTC Calculation: 480 R Axis:   -37  Text Interpretation: Sinus rhythm LVH with IVCD, LAD and secondary repol abnrm Anterior ST elevation, probably due to LVH Confirmed by Laurice Coy [phone]) on 12/06/2023 8:39:58 PM  I have reviewed the labs performed to date as well as medications administered while in observation.  Recent changes in the last 24 hours include no changes noted.  Plan  Current plan is for plan is for skilled nursing facility placement.  Patient was to have repeat PT done and placement pending PT evaluation versus going home.    Levander Houston, MD 12/12/23 [phone]

## 2023-12-13 NOTE — Progress Notes (Signed)
 Physical Therapy Treatment Patient Details Name: Connie Snyder MRN: [id number] DOB: November 01, 1927 Today's Date: 12/13/2023   History of Present Illness 89 yo female brouight to ED 12/06/23 from home with RUE pain, then reported chest pain. Patient recentlly diagnosed with right  olecranon fracture,  son reports inability to ambulate, poor PO intake/ FTT. PMH: PE, THA    PT Comments   Patient's family present. Patient alert, able to participate in mobility. Assisted to sitting on bed edge, once sitting, able to balance self with cues.  Patient assisted to stand with +2 max assist, took small shuffle steps to recliner, sat for rest and assisted to stand again .  Patient very pleasant ,  more focused on conversation  when she is understanding . Patient will benefit from continued inpatient follow up therapy, <3 hours/day.   If plan is discharge home, recommend the following: Two people to help with walking and/or transfers;Two people to help with bathing/dressing/bathroom;Assistance with feeding;Assistance with cooking/housework   Can travel by [employer]ista (measurements PT);Wheelchair (measurements PT)    Recommendations for Other Services       Precautions / Restrictions Precautions Precautions: Fall Recall of Precautions/Restrictions: Impaired Required Braces or Orthoses: Splint/Cast Splint/Cast: R UE posterior spint Restrictions RUE Weight Bearing Per Provider Order: Non weight bearing     Mobility  Bed Mobility Overal bed mobility: Needs Assistance Bed Mobility: Supine to Sit     Supine to sit: Max assist, +2 for physical assistance, +2 for safety/equipment     General bed mobility comments: assisted legs and trunk and  use of bed pad to sit upright, once sitting, patient able to control balance.    Transfers Overall transfer level: Needs assistance Equipment used: 2 person hand held assist Transfers: Sit to/from  Stand, Bed to chair/wheelchair/BSC Sit to Stand: From elevated surface, Total assist, +2 physical assistance, +2 safety/equipment Stand pivot transfers: +2 physical assistance, +2 safety/equipment, Max assist         General transfer comment: Patient assisted to stand  with use of safety belt and  LUE/trunk. Patient leaning forward but able to bear weight through legs  , small steps to recliner.Assisted patient to sit  down to rest. Assisted to stand again with +2 armhold  , ASsisted with  pericare. Patient  cooperative and able to bear weight x ~ 3-4 minutes while standing    Ambulation/Gait                   Stairs             Wheelchair Mobility     Tilt Bed    Modified Rankin (Stroke Patients Only)       Balance Overall balance assessment: Needs assistance Sitting-balance support: Feet supported, No upper extremity supported Sitting balance-Leahy Scale: Poor Sitting balance - Comments: initally posterior lean progressing to  close guard, patient supporting self with no UE support.   Standing balance support: During functional activity, Single extremity supported Standing balance-Leahy Scale: Poor Standing balance comment: able to stand  with Left arm support and gait belt to maintain balance                            Communication Communication Factors Affecting Communication: Hearing impaired  Cognition Arousal: Alert Behavior During Therapy: WFL for tasks assessed/performed   PT - Cognitive impairments: History of cognitive impairments  Orientation impairments: Time, Situation, Place                   PT - Cognition Comments: patient's dtr present, patient calm and conversive, knows who family is, did follow simple directions with increased time,  patient able to follow simple directions for right hand  finger ROM. Following commands: Impaired Following commands impaired: Follows one step commands inconsistently, Follows one  step commands with increased time    Cueing Cueing Techniques: Verbal cues, Tactile cues  Exercises      General Comments        Pertinent Vitals/Pain Pain Assessment Breathing: occasional labored breathing, short period of hyperventilation Negative Vocalization: occasional moan/groan, low speech, negative/disapproving quality Facial Expression: smiling or inexpressive Body Language: relaxed Consolability: no need to console PAINAD Score: 2    Home Living                          Prior Function            PT Goals (current goals can now be found in the care plan section) Progress towards PT goals: Progressing toward goals    Frequency    Min 2X/week      PT Plan      Co-evaluation              AM-PAC PT 6 Clicks Mobility   Outcome Measure  Help needed turning from your back to your side while in a flat bed without using bedrails?: Total Help needed moving from lying on your back to sitting on the side of a flat bed without using bedrails?: Total Help needed moving to and from a bed to a chair (including a wheelchair)?: Total Help needed standing up from a chair using your arms (e.g., wheelchair or bedside chair)?: Total Help needed to walk in hospital room?: Total Help needed climbing 3-5 steps with a railing? : Total 6 Click Score: 6    End of Session Equipment Utilized During Treatment: Gait belt Activity Tolerance: Patient tolerated treatment well Patient left: in chair;with call bell/phone within reach;with family/visitor present Nurse Communication: Mobility status PT Visit Diagnosis: Other abnormalities of gait and mobility (R26.89);Adult, failure to thrive (R62.7)     Time: 8569-8546 PT Time Calculation (min) (ACUTE ONLY): 23 min  Charges:    $Therapeutic Activity: 23-37 mins PT General Charges $$ ACUTE PT VISIT: 1 Visit                    Darice Potters PT Acute Rehabilitation Services Office [phone]   [name] 12/13/2023, 3:24 PM

## 2023-12-13 NOTE — ED Provider Notes (Signed)
 Emergency Medicine Observation Re-evaluation Note  Connie Snyder is a 89 y.o. female, seen on rounds today.  Pt initially presented to the ED for complaints of Chest Pain Currently, the patient is sleeping on my evaluation.  Physical Exam  BP 123/74 (BP Location: Left Arm)   Pulse 61   Temp (!) 97.5 F (36.4 C) (Oral)   Resp 16   SpO2 95%  Physical Exam General: No acute distress Cardiac: Regular rate and rhythm normal blood pressure Lungs: No distress normal respiratory rate normal oxygen saturations Psych: Sleeping at this time  ED Course / MDM  EKG:EKG Interpretation Date/Time:  Wednesday December 06 2023 20:26:08 EDT Ventricular Rate:  99 PR Interval:  150 QRS Duration:  113 QT Interval:  374 QTC Calculation: 480 R Axis:   -37  Text Interpretation: Sinus rhythm LVH with IVCD, LAD and secondary repol abnrm Anterior ST elevation, probably due to LVH Confirmed by Laurice Coy [phone]) on 12/06/2023 8:39:58 PM  I have reviewed the labs performed to date as well as medications administered while in observation.  Recent changes in the last 24 hours include no changes noted.  Plan  Current plan is for plan is for skilled nursing facility placement.  There is going to be an attempt to appeal the placement process, perhaps going home today.      Emil Share, DO 12/13/23 [phone]

## 2023-12-13 NOTE — Progress Notes (Addendum)
-  late entry-  CSW received communication via fax from [facility] requesting pt complete authorized representative form. CSW called provider line and informed pt has diagnosis of dementia and does not have the capacity to understand the form or provide signature. CSW informed that in conversation with Vernell SQUIBB [facility] Representative) on 9/9, this writer was advised to submit formal letter requesting appeal. Representative, Del C., requested CSW provide documentation of pt's diagnosis so that appeal can proceed. CSW inquired about whether pt's son needed to intervene and request a new appeal and Del informed this Clinical research associate that he could not do so. CSW provided Appeals Dept initial provider note, EDP re-evaluation note, and snapshot/hospital list of pt's diagnosis via fax. Successful transmission notice rec'd.

## 2023-12-14 ENCOUNTER — Emergency Department (HOSPITAL_COMMUNITY)

## 2023-12-14 DIAGNOSIS — S42414D Nondisplaced simple supracondylar fracture without intercondylar fracture of right humerus, subsequent encounter for fracture with routine healing: Secondary | ICD-10-CM | POA: Diagnosis not present

## 2023-12-14 NOTE — Progress Notes (Addendum)
 CSW faxed most recent PT eval to appeals dept.   Addend @ 3:08PM Appeal is still pending.

## 2023-12-14 NOTE — ED Notes (Addendum)
 Pt was given something to drink  Pt has also been changed into clean brief, bed pad changed also

## 2023-12-14 NOTE — ED Provider Notes (Signed)
 Emergency Medicine Observation Re-evaluation Note  Connie Snyder is a 89 y.o. female, seen on rounds today.  Pt initially presented to the ED for complaints of Chest Pain Currently, the patient is resting.  Physical Exam  BP 118/73 (BP Location: Left Arm)   Pulse (!) 59   Temp 98.7 F (37.1 C) (Oral)   Resp 16   SpO2 98%  Physical Exam General: nad Lungs: equal chest rise Psych: sleeping  ED Course / MDM  EKG:EKG Interpretation Date/Time:  Wednesday December 06 2023 20:26:08 EDT Ventricular Rate:  99 PR Interval:  150 QRS Duration:  113 QT Interval:  374 QTC Calculation: 480 R Axis:   -37  Text Interpretation: Sinus rhythm LVH with IVCD, LAD and secondary repol abnrm Anterior ST elevation, probably due to LVH Confirmed by Laurice Coy [phone]) on 12/06/2023 8:39:58 PM  I have reviewed the labs performed to date as well as medications administered while in observation.  Recent changes in the last 24 hours include PT worked w/ pt yestd  Plan  Current plan is for possible placement, discussion of appeal from insurance company, see TOC note.    Elnor Jayson LABOR, DO 12/14/23 [phone]

## 2023-12-15 DIAGNOSIS — Z7901 Long term (current) use of anticoagulants: Secondary | ICD-10-CM | POA: Diagnosis not present

## 2023-12-15 DIAGNOSIS — Z86711 Personal history of pulmonary embolism: Secondary | ICD-10-CM | POA: Diagnosis not present

## 2023-12-15 DIAGNOSIS — W19XXXA Unspecified fall, initial encounter: Secondary | ICD-10-CM | POA: Diagnosis not present

## 2023-12-15 DIAGNOSIS — S42414A Nondisplaced simple supracondylar fracture without intercondylar fracture of right humerus, initial encounter for closed fracture: Secondary | ICD-10-CM | POA: Diagnosis not present

## 2023-12-15 DIAGNOSIS — R079 Chest pain, unspecified: Secondary | ICD-10-CM | POA: Diagnosis not present

## 2023-12-15 DIAGNOSIS — M7989 Other specified soft tissue disorders: Secondary | ICD-10-CM | POA: Diagnosis not present

## 2023-12-15 NOTE — Consult Note (Signed)
 Reason for Consult:Right elbow fx Referring Physician: Andrea Ness Time called: 0730 Time at bedside: 1100   Connie Snyder is an 89 y.o. female.  HPI: Connie Snyder fell and suffered a supracondylar humerus fx on 8/31. She was splinted and discharged with plans for OP f/u. She returned to the ED 9/3 as she was unable to be cared for at home. She has been here ever since trying to get a SNF bed. She has dementia so history is limited though she says the elbow is only a little sore.  Past Medical History:  Diagnosis Date   Arthritis    Pulmonary embolism (HCC)    Sjogren's disease (HCC)     Past Surgical History:  Procedure Laterality Date   TOTAL HIP ARTHROPLASTY Left 12/22/2017   Procedure: TOTAL HIP ARTHROPLASTY ANTERIOR APPROACH;  Surgeon: Jerri Kay HERO, MD;  Location: [facility] OR;  Service: Orthopedics;  Laterality: Left;    Family History  Problem Relation Age of Onset   Pulmonary embolism Other     Social History:  reports that she has quit smoking. She has never used smokeless tobacco. She reports current alcohol use. She reports that she does not use drugs.  Allergies:  Allergies  Allergen Reactions   Aricept  [Donepezil ] Nausea And Vomiting    Medications: I have reviewed the patient's current medications.  No results found for this or any previous visit (from the past 48 hours).  DG ELBOW COMPLETE RIGHT (3+VIEW) Result Date: 12/14/2023 CLINICAL DATA:  Fracture follow-up EXAM: RIGHT ELBOW - COMPLETE 3+ VIEW COMPARISON:  December 03, 2023 FINDINGS: Similar alignment of nondisplaced supracondylar distal humerus fracture with increasing lucency at the fracture site and sclerosis of the fracture fragments, suggestive of healing. Joint spaces are preserved. No new fractures identified. Overlying cast material, limiting evaluation of fine osseous and soft tissue details. IMPRESSION: Similar alignment of healing nondisplaced supracondylar distal humerus fracture. Electronically Signed    By: Michaeline Blanch M.D.   On: 12/14/2023 16:46    Review of Systems  Unable to perform ROS: Dementia   Blood pressure 128/76, pulse 65, temperature 98.2 F (36.8 C), temperature source Oral, resp. rate 19, SpO2 100%. Physical Exam Constitutional:      General: She is not in acute distress.    Appearance: She is well-developed. She is not diaphoretic.  HENT:     Head: Normocephalic and atraumatic.  Eyes:     General: No scleral icterus.       Right eye: No discharge.        Left eye: No discharge.     Conjunctiva/sclera: Conjunctivae normal.  Cardiovascular:     Rate and Rhythm: Normal rate and regular rhythm.  Pulmonary:     Effort: Pulmonary effort is normal. No respiratory distress.  Musculoskeletal:     Cervical back: Normal range of motion.     Comments: Right shoulder, elbow, wrist, digits- no skin wounds, elbow splint in place, no instability, no blocks to motion  Sens  Ax/R/M/U intact  Mot   Ax/ R/ PIN/ M/ AIN/ U intact  Fingers perfused  Skin:    General: Skin is warm and dry.  Neurological:     Mental Status: She is alert.  Psychiatric:        Mood and Affect: Mood normal.        Behavior: Behavior normal.     Assessment/Plan: Right humerus fx -- Plan continued non-operative treatment with splint and NWB. Can covert to cast at this point. F/u  with Dr. Celena in 2 weeks.    Connie DOROTHA Ned, PA-C Orthopedic Surgery [phone] 12/15/2023, 11:05 AM

## 2023-12-15 NOTE — ED Notes (Addendum)
 Pt is catnapping has not slept longer than 

## 2023-12-15 NOTE — Progress Notes (Signed)
 Appeal pending. Determination expected today.

## 2023-12-15 NOTE — ED Provider Notes (Signed)
 Emergency Medicine Observation Re-evaluation Note  Connie Snyder is a 89 y.o. female, seen on rounds today.  Pt initially presented to the ED for complaints of Chest Pain Currently, the patient is resting with no complaints   Physical Exam  BP 128/76 (BP Location: Left Arm)   Pulse 65   Temp 98.2 F (36.8 C) (Oral)   Resp 19   SpO2 100%  Physical Exam General: NAD Cardiac: RR Lungs: non labored  Psych: calm  RUE; brisk cap refill.   ED Course / MDM  EKG:EKG Interpretation Date/Time:  Wednesday December 06 2023 20:26:08 EDT Ventricular Rate:  99 PR Interval:  150 QRS Duration:  113 QT Interval:  374 QTC Calculation: 480 R Axis:   -37  Text Interpretation: Sinus rhythm LVH with IVCD, LAD and secondary repol abnrm Anterior ST elevation, probably due to LVH Confirmed by Laurice Coy [phone]) on 12/06/2023 8:39:58 PM  I have reviewed the labs performed to date as well as medications administered while in observation.  Recent changes in the last 24 hours include none  Plan  Current plan is for placement; I did consult ortho as the plan was for outpatient follow-up, but she has been here for some time.  Ortho has evaluated patient recommendations per their note and recommendations   Assessment/Plan: Right humerus fx -- Plan continued non-operative treatment with splint and NWB. Can covert to cast at this point. F/u with Dr. Celena in 2 weeks.    Neysa Caron JINNY, DO 12/15/23 1433

## 2023-12-15 NOTE — Progress Notes (Signed)
 Orthopedic Tech Progress Note Patient Details:  Connie Snyder 11/18/1927 [id number]  Casting Type of Cast: Long arm cast Cast Location: Right arm Cast Material: Fiberglass Cast Intervention: Application  Post Interventions Patient Tolerated: Well   Connie Snyder Ha Placeres 12/15/2023, 12:24 PM

## 2023-12-16 NOTE — ED Provider Notes (Signed)
 Emergency Medicine Observation Re-evaluation Note  Connie Snyder is a 89 y.o. female, seen on rounds today.  Pt initially presented to the ED for complaints of Chest Pain Currently, the patient is sleeping.  Physical Exam  BP 107/72 (BP Location: Left Arm)   Pulse (!) 57   Temp 97.8 F (36.6 C) (Axillary)   Resp 16   SpO2 100%  Physical Exam   ED Course / MDM  EKG:EKG Interpretation Date/Time:  Wednesday December 06 2023 20:26:08 EDT Ventricular Rate:  99 PR Interval:  150 QRS Duration:  113 QT Interval:  374 QTC Calculation: 480 R Axis:   -37  Text Interpretation: Sinus rhythm LVH with IVCD, LAD and secondary repol abnrm Anterior ST elevation, probably due to LVH Confirmed by Laurice Coy [phone]) on 12/06/2023 8:39:58 PM  I have reviewed the labs performed to date as well as medications administered while in observation.  Recent changes in the last 24 hours include awaiting insurance..  Plan  Current plan is for placement.    Dasie Faden, MD 12/16/23 [phone]

## 2023-12-16 NOTE — Progress Notes (Signed)
 Physical Therapy Treatment Patient Details Name: Connie Snyder MRN: [id number] DOB: 1927/05/03 Today's Date: 12/16/2023   History of Present Illness 89 yo female brouight to ED 12/06/23 from home with RUE pain, then reported chest pain. Patient recentlly diagnosed with right  olecranon fracture,  son reports inability to ambulate, poor PO intake/ FTT. PMH: PE, THA    PT Comments  Pt received semireclined in bed significantly turned on to L side, very lethargic, per sitter pt sleeping all day, responsive to voice but declined opening eyes. Pt dependent for bed mobility and squat pivot transfer as well as sitting EOB, generally limited by lethargy. Pt in recliner repositioned with pillows in stroke-style seated position to change loading of tissues. Discharge destination remains appropriate. Added to mobility specialist caseload for exercises and transfers only to improve time spent OOB as well as opportunity for increased blood flow, oxygen to tissues, and strength. We will continue to follow acutely.     If plan is discharge home, recommend the following: Two people to help with walking and/or transfers;Two people to help with bathing/dressing/bathroom;Assistance with feeding;Assistance with cooking/housework;Direct supervision/assist for medications management;Assist for transportation;Supervision due to cognitive status   Can travel by private Theme park manager cushion (measurements PT);Wheelchair (measurements PT)    Recommendations for Other Services       Precautions / Restrictions Precautions Precautions: Fall Recall of Precautions/Restrictions: Impaired Required Braces or Orthoses: Splint/Cast Splint/Cast: cast Restrictions Weight Bearing Restrictions Per Provider Order: Yes RUE Weight Bearing Per Provider Order: Non weight bearing     Mobility  Bed Mobility Overal bed mobility: Needs Assistance Bed Mobility: Supine to Sit     Supine  to sit: Total assist, +2 for physical assistance     General bed mobility comments: assisted legs and trunk and  use of bed pad via helicopter method. to sit upright, max to total assist to remain sitting upright; once sitting upright improved alertness slightly, but generally non-participatory.    Transfers Overall transfer level: Needs assistance Equipment used: 2 person hand held assist Transfers: Bed to chair/wheelchair/BSC       Squat pivot transfers: Total assist, +2 physical assistance     General transfer comment: Pt sitting EOB and generally low arousal, utilized squat pivot transfer method dependently to place pt in recliner to change loading of tissues, pillows placed under BUE/shoudlers, behind head, and heels floated with BLE elevated, sitter present.    Ambulation/Gait               General Gait Details: unsafe   Stairs             Wheelchair Mobility     Tilt Bed    Modified Rankin (Stroke Patients Only)       Balance Overall balance assessment: Needs assistance Sitting-balance support: Feet supported, No upper extremity supported Sitting balance-Leahy Scale: Zero Sitting balance - Comments: total assist to prevent posterior LOB                                    Communication Communication Communication: No apparent difficulties Factors Affecting Communication: Hearing impaired  Cognition Arousal: Lethargic Behavior During Therapy: Flat affect   PT - Cognitive impairments: No family/caregiver present to determine baseline, Difficult to assess, Attention Difficult to assess due to: Level of arousal  PT - Cognition Comments: Pt very lethargic with eyes closed most of the session Following commands: Impaired Following commands impaired: Follows one step commands inconsistently, Follows one step commands with increased time    Cueing Cueing Techniques: Verbal cues, Tactile cues, Gestural cues   Exercises      General Comments        Pertinent Vitals/Pain Pain Assessment Pain Assessment: No/denies pain Breathing: normal Negative Vocalization: none Facial Expression: smiling or inexpressive Body Language: relaxed Consolability: no need to console PAINAD Score: 0    Home Living                          Prior Function            PT Goals (current goals can now be found in the care plan section) Acute Rehab PT Goals PT Goal Formulation: Patient unable to participate in goal setting Time For Goal Achievement: 12/21/23 Potential to Achieve Goals: Poor Progress towards PT goals: Progressing toward goals    Frequency    Min 2X/week      PT Plan      Co-evaluation              AM-PAC PT 6 Clicks Mobility   Outcome Measure  Help needed turning from your back to your side while in a flat bed without using bedrails?: Total Help needed moving from lying on your back to sitting on the side of a flat bed without using bedrails?: Total Help needed moving to and from a bed to a chair (including a wheelchair)?: Total Help needed standing up from a chair using your arms (e.g., wheelchair or bedside chair)?: Total Help needed to walk in hospital room?: Total Help needed climbing 3-5 steps with a railing? : Total 6 Click Score: 6    End of Session Equipment Utilized During Treatment: Gait belt Activity Tolerance: Patient limited by lethargy Patient left: in chair;with call bell/phone within reach;with nursing/sitter in room Nurse Communication: Mobility status PT Visit Diagnosis: Other abnormalities of gait and mobility (R26.89);Adult, failure to thrive (R62.7)     Time: 1350-1402 PT Time Calculation (min) (ACUTE ONLY): 12 min  Charges:    $Therapeutic Activity: 8-22 mins PT General Charges $$ ACUTE PT VISIT: 1 Visit                     Elsie Grieves, PT, DPT [facility] Rehabilitation Department Office: [phone]   Elsie Grieves 12/16/2023, 2:21 PM

## 2023-12-16 NOTE — Progress Notes (Signed)
 Auth pending.   Tawni HERO.Hildy Nicholl, MSW, LCSW [facility] Management  Clinical Social Worker  Direct Dial: [phone]  Fax: [phone] [email or web address]

## 2023-12-17 NOTE — ED Provider Notes (Signed)
 Emergency Medicine Observation Re-evaluation Note  Connie Snyder is a 89 y.o. female, seen on rounds today.  Pt initially presented to the ED for complaints of Chest Pain Currently, the patient is awake, says she feels well. State she is listening to radio. Tv is on.  Physical Exam  BP 109/73 (BP Location: Left Arm)   Pulse 75   Temp 98.3 F (36.8 C) (Oral)   Resp 18   SpO2 100%  Physical Exam General: NAD Cardiac: regular rate Lungs: equal chest rise Psych: calm  ED Course / MDM  EKG:EKG Interpretation Date/Time:  Wednesday December 06 2023 20:26:08 EDT Ventricular Rate:  99 PR Interval:  150 QRS Duration:  113 QT Interval:  374 QTC Calculation: 480 R Axis:   -37  Text Interpretation: Sinus rhythm LVH with IVCD, LAD and secondary repol abnrm Anterior ST elevation, probably due to LVH Confirmed by Laurice Coy [phone]) on 12/06/2023 8:39:58 PM  I have reviewed the labs performed to date as well as medications administered while in observation.  Recent changes in the last 24 hours include none.  Plan  Current plan is for placement.    Francesca Elsie CROME, MD 12/17/23 [phone]

## 2023-12-17 NOTE — Progress Notes (Addendum)
 Appeal is still showing pending in [place].  Addend @ 10:57AM CSW attempted to contact Appeals Dept. The call center is closed. Will call tomorrow morning to request update on status of appeal as the 72 hour mark has passed. Navi still reflects that appeal is pending.

## 2023-12-17 NOTE — ED Notes (Signed)
 Patient has been alert this shift. Medication compliant. Patient has been cooperative with care. Patient needs assist with ADLs.

## 2023-12-18 MED ORDER — BOOST / RESOURCE BREEZE PO LIQD CUSTOM
1.0000 | Freq: Every day | ORAL | Status: DC
Start: 2023-12-19 — End: 2024-01-14
  Administered 2023-12-20 – 2024-01-11 (×17): 1 via ORAL
  Filled 2023-12-18 (×26): qty 1

## 2023-12-18 MED ORDER — CARMEX CLASSIC LIP BALM EX OINT
TOPICAL_OINTMENT | Freq: Once | CUTANEOUS | Status: AC
  Administered 2023-12-18: 1 via TOPICAL
  Filled 2023-12-18: qty 10

## 2023-12-18 NOTE — Progress Notes (Addendum)
 CSW spoke with [facility] representatives and have continuously gotten the runaround. CSW attempted to contact pt's son Kierra Jezewski without success- unable to leave a vm.   [employer] representative stated there was not an appeal, although there is one showing pending in [place]. Representative also stated pt's son is only on the account as an authorized representative for [employer] and to make minor changes. CSW informed representative that documentation including the dementia diagnosis was provided and reiterated that patient is confused and unable to sign documents.   CSW will discuss with Jerona Ellen and determine disposition.   Addend @ 9:59AM CSW attempted to contact pt's son, Devanee Pomplun, without success. Unable to leave vm. Will try back.   Addend @ 10:56AM CSW sent a secure text message at 10:44AM requesting call back.

## 2023-12-18 NOTE — ED Notes (Signed)
 Family updated as to patient's status.

## 2023-12-18 NOTE — ED Notes (Signed)
 Pt's son Jerona is visiting pt.

## 2023-12-18 NOTE — Progress Notes (Signed)
 CSW spoke with son who says he is POA; however, does not have paperwork and reports he's never had it. He states he and his mom went to [employer] years ago about the house and signed paperwork. When asked if he's certain it was POA paperwork, he said as far as I know. He states he'd have to go to lawyers office or downtown to get it. CSW asked if he could obtain it as soon as possible as [employer] needs it.

## 2023-12-18 NOTE — Progress Notes (Signed)
 PHYSICAL THERAPY  Pt declined all attempts for any OOB activity including going to the bathroom and offering her a wash up.  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  When offered a hand to assist her even just to sit up, she resists.  Don't touch me.  I told you over and over again, I'm not getting up no reason other than I don't feel like it.  NO means NO!SABRA  Lpt has rec Pt will need ST Rehab at SNF to address mobility and functional decline prior to safely returning home.  Katheryn Leap  PTA Acute  Rehabilitation Services Office M-F          [phone]

## 2023-12-18 NOTE — ED Provider Notes (Signed)
 Emergency Medicine Observation Re-evaluation Note  Connie Snyder is a 89 y.o. female, seen on rounds today.  Pt initially presented to the ED for complaints of Chest Pain Currently, the patient is resting comfortably Physical Exam  BP 113/69 (BP Location: Left Arm)   Pulse (!) 58   Temp 97.7 F (36.5 C) (Oral)   Resp 16   SpO2 99%  Physical Exam General: NAD Cardiac: regular rate Lungs: equal chest rise Psych: calm  ED Course / MDM  EKG:EKG Interpretation Date/Time:  Wednesday December 06 2023 20:26:08 EDT Ventricular Rate:  99 PR Interval:  150 QRS Duration:  113 QT Interval:  374 QTC Calculation: 480 R Axis:   -37  Text Interpretation: Sinus rhythm LVH with IVCD, LAD and secondary repol abnrm Anterior ST elevation, probably due to LVH Confirmed by Laurice Coy [phone]) on 12/06/2023 8:39:58 PM  I have reviewed the labs performed to date as well as medications administered while in observation.  Recent changes in the last 24 hours include none.  Plan  Current plan is for placement.       Emil Share, DO 12/18/23 [phone]

## 2023-12-19 NOTE — ED Notes (Signed)
 Pt has been awake talking about school since 7pm  until 0430 am now she has fell asleep .

## 2023-12-19 NOTE — ED Provider Notes (Signed)
 Emergency Medicine Observation Re-evaluation Note  Connie Snyder is a 89 y.o. female, seen on rounds today.  Pt initially presented to the ED for complaints of Chest Pain Currently, the patient is for placement.  Physical Exam  BP 113/66 (BP Location: Left Arm)   Pulse 67   Temp 99.4 F (37.4 C) (Oral)   Resp 18   SpO2 100%  Physical Exam General: Nontoxic no acute distress Cardiac:  Lungs: No respiratory Psych: Resting comfortably  ED Course / MDM  EKG:EKG Interpretation Date/Time:  Wednesday December 06 2023 20:26:08 EDT Ventricular Rate:  99 PR Interval:  150 QRS Duration:  113 QT Interval:  374 QTC Calculation: 480 R Axis:   -37  Text Interpretation: Sinus rhythm LVH with IVCD, LAD and secondary repol abnrm Anterior ST elevation, probably due to LVH Confirmed by Laurice Coy [phone]) on 12/06/2023 8:39:58 PM  I have reviewed the labs performed to date as well as medications administered while in observation.  Recent changes in the last 24 hours include social worker still working on placement.  Plan  Current plan is for placement.    Ruthanne Mcneish, MD 12/19/23 [phone]

## 2023-12-19 NOTE — Progress Notes (Signed)
 Hospital Leadership in communication with [facility] leadership to address barrier to appeal determination.

## 2023-12-20 MED ORDER — POLYETHYLENE GLYCOL 3350 17 G PO PACK
17.0000 g | PACK | Freq: Once | ORAL | Status: AC
  Administered 2023-12-20: 17 g via ORAL
  Filled 2023-12-20: qty 1

## 2023-12-20 NOTE — Progress Notes (Signed)
 Son going to apply for legal guardianship.

## 2023-12-20 NOTE — Progress Notes (Signed)
 Physical Therapy Treatment Patient Details Name: Connie Snyder MRN: [id number] DOB: 11/06/1927 Today's Date: 12/20/2023   History of Present Illness 89 yo female brouight to ED 12/06/23 from home with RUE pain, then reported chest pain. Patient recentlly diagnosed with right  olecranon fracture,  son reports inability to ambulate, poor PO intake/ FTT. PMH: PE, THA    PT Comments   Patient awake, eyes closed mostly. Patient  asking for knife to be removed from her hand.  Reciting  alphabet.  Patient allowed therapist to assist to sitting onto bed edge , required support for balance, intermittent control near midline. Patient sat x ~ 8 mins. Assisted back to be d for dinner.   Patient will benefit from continued inpatient follow up therapy, <3 hours/day    If plan is discharge home, recommend the following: Two people to help with walking and/or transfers;Two people to help with bathing/dressing/bathroom;Assistance with feeding;Assistance with cooking/housework;Direct supervision/assist for medications management;Assist for transportation;Supervision due to cognitive status   Can travel by private Theme park manager cushion (measurements PT);Wheelchair (measurements PT)    Recommendations for Other Services       Precautions / Restrictions Precautions Precautions: Fall Recall of Precautions/Restrictions: Impaired Required Braces or Orthoses: Splint/Cast Splint/Cast: cast Restrictions RUE Weight Bearing Per Provider Order: Non weight bearing     Mobility  Bed Mobility   Bed Mobility: Supine to Sit, Sit to Supine     Supine to sit: Total assist Sit to supine: Total assist   General bed mobility comments: patient  allowed therapist to move legs, bed pad used to turn hips, legs lifted over to bed edge, bed pad used to lift trunk to sitting. Total assist for legs back onto bed, assist trunk to supine    Transfers                         Ambulation/Gait                   Stairs             Wheelchair Mobility     Tilt Bed    Modified Rankin (Stroke Patients Only)       Balance   Sitting-balance support: Feet supported, No upper extremity supported Sitting balance-Leahy Scale: Zero Sitting balance - Comments: intermittently repositions trunk to near midline with tactile cues Postural control: Right lateral lean                                  Communication Communication Communication: No apparent difficulties  Cognition Arousal: Alert     PT - Cognitive impairments: Difficult to assess, Attention   Orientation impairments: Time, Place, Situation                   PT - Cognition Comments: patient alert, keeps eyes closed Following commands: Impaired Following commands impaired: Follows one step commands inconsistently, Follows one step commands with increased time    Cueing    Exercises      General Comments        Pertinent Vitals/Pain Pain Assessment Breathing: normal Negative Vocalization: occasional moan/groan, low speech, negative/disapproving quality Facial Expression: smiling or inexpressive Body Language: tense, distressed pacing, fidgeting Consolability: distracted or reassured by voice/touch PAINAD Score: 3    Home Living  Prior Function            PT Goals (current goals can now be found in the care plan section) Acute Rehab PT Goals PT Goal Formulation: Patient unable to participate in goal setting Time For Goal Achievement: 01/09/24 Potential to Achieve Goals: Poor Progress towards PT goals: Progressing toward goals;Goals updated    Frequency    Min 2X/week      PT Plan      Co-evaluation              AM-PAC PT 6 Clicks Mobility   Outcome Measure  Help needed turning from your back to your side while in a flat bed without using bedrails?: Total Help needed moving from  lying on your back to sitting on the side of a flat bed without using bedrails?: Total Help needed moving to and from a bed to a chair (including a wheelchair)?: Total Help needed standing up from a chair using your arms (e.g., wheelchair or bedside chair)?: Total Help needed to walk in hospital room?: Total Help needed climbing 3-5 steps with a railing? : Total 6 Click Score: 6    End of Session   Activity Tolerance: Patient tolerated treatment well Patient left: in bed;with nursing/sitter in room Nurse Communication: Mobility status PT Visit Diagnosis: Other abnormalities of gait and mobility (R26.89);Adult, failure to thrive (R62.7)     Time: 8384-8367 PT Time Calculation (min) (ACUTE ONLY): 17 min  Charges:    $Therapeutic Activity: 8-22 mins PT General Charges $$ ACUTE PT VISIT: 1 Visit                    Darice Potters PT Acute Rehabilitation Services Office [phone]    [name] 12/20/2023, 4:41 PM

## 2023-12-20 NOTE — ED Provider Notes (Signed)
 Emergency Medicine Observation Re-evaluation Note  Connie Snyder is a 89 y.o. female, seen on rounds today.  Pt initially presented to the ED for complaints of Chest Pain Currently, the patient is sleeping and in no acute distress.  Physical Exam  BP 120/81 (BP Location: Left Arm)   Pulse (!) 51   Temp 98.5 F (36.9 C) (Oral)   Resp 18   SpO2 100%  Physical Exam General: No acute distress Cardiac: Bradycardic Lungs: Clear   ED Course / MDM  EKG:EKG Interpretation Date/Time:  Wednesday December 06 2023 20:26:08 EDT Ventricular Rate:  99 PR Interval:  150 QRS Duration:  113 QT Interval:  374 QTC Calculation: 480 R Axis:   -37  Text Interpretation: Sinus rhythm LVH with IVCD, LAD and secondary repol abnrm Anterior ST elevation, probably due to LVH Confirmed by Laurice Coy [phone]) on 12/06/2023 8:39:58 PM  I have reviewed the labs performed to date as well as medications administered while in observation.  Recent changes in the last 24 hours include none.  Plan  Current plan is for still seeking placement for the patient.    Doretha Folks, MD 12/20/23 [phone]

## 2023-12-21 MED ORDER — POLYETHYLENE GLYCOL 3350 17 G PO PACK
17.0000 g | PACK | Freq: Every day | ORAL | Status: DC
  Administered 2023-12-21 – 2024-01-10 (×16): 17 g via ORAL
  Filled 2023-12-21 (×19): qty 1

## 2023-12-21 NOTE — ED Notes (Signed)
 Ortho tech called and asked to inspect  cast for needed attention.

## 2023-12-21 NOTE — ED Notes (Signed)
 Pt resting for most of the day. Declined breakfast and lunch. Pt not able to take medication today d/t lethargy. Opens eyes and responds to voice. Will continue to offer nutrition and meals.

## 2023-12-21 NOTE — ED Provider Notes (Signed)
 Emergency Medicine Observation Re-evaluation Note  Connie Snyder is a 89 y.o. female, seen on rounds today.  Pt initially presented to the ED for complaints of Chest Pain Currently, the patient is sleeping.SABRA  Physical Exam  BP 117/65 (BP Location: Left Arm)   Pulse (!) 50   Temp (!) 97.4 F (36.3 C)   Resp 14   SpO2 100%  Physical Exam   ED Course / MDM  EKG:EKG Interpretation Date/Time:  Wednesday December 06 2023 20:26:08 EDT Ventricular Rate:  99 PR Interval:  150 QRS Duration:  113 QT Interval:  374 QTC Calculation: 480 R Axis:   -37  Text Interpretation: Sinus rhythm LVH with IVCD, LAD and secondary repol abnrm Anterior ST elevation, probably due to LVH Confirmed by Laurice Coy [phone]) on 12/06/2023 8:39:58 PM  I have reviewed the labs performed to date as well as medications administered while in observation.  Recent changes in the last 24 hours include .  Plan  Current plan is for placement.  Leadership aware of barriers and are working to overcome them.    Dasie Faden, MD 12/21/23 [id number]

## 2023-12-21 NOTE — Progress Notes (Addendum)
 CSW attempted to contact pt's son to inquire about whether he's completed guardianship paperwork. Unable to leave a vm. Will try back.   Addend @ 11:31AM CSW spoke with pts son who sated he has cleared his afternoon and will be going to the courthouse to apply for guardianship. CSW informed there is not movement with [facility] appeal and that it is likely to be denied once a determination is made. CSW explored other options such as sons interest in palliative consult. Son reported he is open to that. CSW also informed the only way pt could potentially be placed into SNF is if she qualified for Medicaid and encouraged son to apply at DSS this afternoon as well. Son reported he will do so. Hospital leadership notified.

## 2023-12-21 NOTE — ED Notes (Signed)
 Patients son asked for something for constipation for his mother

## 2023-12-22 DIAGNOSIS — Z7189 Other specified counseling: Secondary | ICD-10-CM

## 2023-12-22 DIAGNOSIS — M25521 Pain in right elbow: Secondary | ICD-10-CM

## 2023-12-22 DIAGNOSIS — Z515 Encounter for palliative care: Secondary | ICD-10-CM

## 2023-12-22 NOTE — Progress Notes (Signed)
 CSW attempted to contact pt's son without success. Unable to leave vm.

## 2023-12-22 NOTE — ED Provider Notes (Signed)
 Emergency Medicine Observation Re-evaluation Note  Connie Snyder is a 89 y.o. female, seen on rounds today.  Pt initially presented to the ED for complaints of Chest Pain Currently, the patient is sleeping.  Physical Exam  BP 107/62   Pulse (!) 58   Temp 98.1 F (36.7 C) (Oral)   Resp 18   SpO2 98%  Physical Exam   ED Course / MDM  EKG:EKG Interpretation Date/Time:  Wednesday December 06 2023 20:26:08 EDT Ventricular Rate:  99 PR Interval:  150 QRS Duration:  113 QT Interval:  374 QTC Calculation: 480 R Axis:   -37  Text Interpretation: Sinus rhythm LVH with IVCD, LAD and secondary repol abnrm Anterior ST elevation, probably due to LVH Confirmed by Laurice Coy [phone]) on 12/06/2023 8:39:58 PM  I have reviewed the labs performed to date as well as medications administered while in observation.  Recent changes in the last 24 hours include palliative care consult placed..  Plan  Current plan is for discharge to SNF barriers remaining with financial and conservatorship.    Dasie Faden, MD 12/22/23 [phone]

## 2023-12-22 NOTE — Progress Notes (Signed)
 Provided daughter an update.

## 2023-12-22 NOTE — Progress Notes (Signed)
 Son has received guardianship paperwork and reports he has discussed with his sister. He states he will submit the paperwork on Monday. He was unaware that Medicaid process is started at DSS but will also do that on Monday. He initially inquired about the Medicaid app at the courthouse and was informed they did not have them there. ICM following.   Palliative MD to reach out to family.

## 2023-12-22 NOTE — Consult Note (Signed)
 Consultation Note Date: 12/22/2023   Patient Name: Connie Snyder  DOB: 05/29/1927  MRN: [id number]  Age / Sex: 89 y.o., female  PCP: Verdia Lombard, MD Referring Physician: Dasie Faden, MD  Reason for Consultation: Establishing goals of care  HPI/Patient Profile: 89 y.o. female  admitted on 12/06/2023  Clinical Assessment and Goals of Care: 89 year old lady, currently in the ED at [facility] where she initially presented 15 days ago for chest pain, on 8-31 she fell and suffered a supracondylar humerus fracture and was seen and evaluated by orthopedics - for the R humerus fracture, she was treated with splint and NWB non-operative treatment was recommended.  Patient has been followed closely by PT and TOC, disposition has remained a challenge.  A palliative consult has been obtained for additional assistance.   Patient seen and examined, discussed with RN colleague prior to seeing patient. Patient has poor to minimal PO intake and remains confused, sometimes refuses therapies medications and meals as well.  Patient is an elderly lady resting in bed, her arm is in a sling. She is very confused, she thinks that this provider is her cousin Kenney who has been dead for a while now. She is tangential in thought, not oriented to place or time. She believes she is around 89 years old, she talks about someone being in the [employer]. She is not able to be re directed or oriented to her current situation.  Palliative medicine is specialized medical care for people living with serious illness. It focuses on providing relief from the symptoms and stress of a serious illness. The goal is to improve quality of life for both the patient and the family. Goals of care: Broad aims of medical therapy in relation to the patient's values and preferences. Our aim is to provide medical care aimed at enabling patients  to achieve the goals that matter most to them, given the circumstances of their particular medical situation and their constraints.   As per TOC note - patient's son Connie Snyder has received guardianship paperwork and has also been given information regarding the Medicaid process.   NEXT OF KIN  Son Connie Snyder- call placed, unable to reach at this time, palliative care to re attempt on 12-23-23 for code status and goals of care discussions.  Has son and daughter - as per char review.   SUMMARY OF RECOMMENDATIONS    1. Inpatient palliative care team recommends consideration of DNR DNI, ideal disposition would be to a memory care unit with Hospice, [facility] assisting son with medicaid application. Discussed with Emergency Department MD, patient's RN as well as TOC.  PMT to follow.   Code Status/Advance Care Planning: Full code   Symptom Management:     Palliative Prophylaxis:  Frequent Pain Assessment  Additional Recommendations (Limitations, Scope, Preferences): Full Scope Treatment  Psycho-social/Spiritual:  Desire for further Chaplaincy support:yes Additional Recommendations: Caregiving  Support/Resources  Prognosis:  Unable to determine  Discharge Planning: recommend memory care with hospice      Primary  Diagnoses: Present on Admission: **None**   I have reviewed the medical record, interviewed the patient and family, and examined the patient. The following aspects are pertinent.  Past Medical History:  Diagnosis Date   Arthritis    Pulmonary embolism (HCC)    Sjogren's disease (HCC)    Social History   Socioeconomic History   Marital status: Widowed    Spouse name: Not on file   Number of children: Not on file   Years of education: Not on file   Highest education level: Not on file  Occupational History   Not on file  Tobacco Use   Smoking status: Former   Smokeless tobacco: Never  Vaping Use   Vaping status: Never Used  Substance and Sexual Activity    Alcohol use: Yes    Comment: weekly   Drug use: Never   Sexual activity: Not on file  Other Topics Concern   Not on file  Social History Narrative   Not on file   Social Drivers of Health   Financial Resource Strain: Not on file  Food Insecurity: Patient Unable To Answer (03/30/2023)   Hunger Vital Sign    Worried About Running Out of Food in the Last Year: Patient unable to answer    Ran Out of Food in the Last Year: Patient unable to answer  Transportation Needs: Patient Unable To Answer (03/30/2023)   PRAPARE - Transportation    Lack of Transportation (Medical): Patient unable to answer    Lack of Transportation (Non-Medical): Patient unable to answer  Physical Activity: Not on file  Stress: Not on file  Social Connections: Unknown (04/03/2023)   Social Connection and Isolation Panel    Frequency of Communication with Friends and Family: Patient unable to answer    Frequency of Social Gatherings with Friends and Family: Patient unable to answer    Attends Religious Services: Not on Marketing executive or Organizations: Patient unable to answer    Attends Banker Meetings: Patient unable to answer    Marital Status: Patient unable to answer   Family History  Problem Relation Age of Onset   Pulmonary embolism Other    Scheduled Meds:  apixaban   2.5 mg Oral BID   cyanocobalamin   1,000 mcg Oral Daily   feeding supplement  1 Container Oral Q lunch   mirtazapine   15 mg Oral QHS   polyethylene glycol  17 g Oral Daily   Ensure Max Protein  11 oz Oral BID   Continuous Infusions: PRN Meds:.acetaminophen , HYDROcodone -acetaminophen , hydrOXYzine  Medications Prior to Admission:  Prior to Admission medications   Medication Sig Start Date End Date Taking? Authorizing Provider  ELIQUIS  2.5 MG TABS tablet Take 2.5 mg by mouth 2 (two) times daily.   Yes [provider]  feeding supplement (BOOST HIGH PROTEIN) LIQD Take 1 Container by mouth 2 (two)  times daily between meals.   Yes [provider]  ferrous sulfate  325 (65 FE) MG EC tablet Take 1 tablet (325 mg total) by mouth daily with breakfast. 04/04/23 12/08/23 Yes Regalado, Belkys A, MD  folic acid  (FOLVITE ) 1 MG tablet Take 1 tablet (1 mg total) by mouth daily. 04/05/23  Yes Regalado, Belkys A, MD  furosemide  (LASIX ) 20 MG tablet Take 20 mg by mouth daily as needed for fluid.   Yes [provider]  HYDROcodone -acetaminophen  (NORCO/VICODIN) 5-325 MG tablet Take 1 tablet by mouth every 6 (six) hours as needed. 12/06/23  Yes Laurice Coy  C, MD  hydrOXYzine  (ATARAX ) 25 MG tablet Take 25 mg by mouth daily as needed for anxiety. 08/18/23  Yes [provider]  mirtazapine  (REMERON ) 15 MG tablet TAKE 1 TABLET BY MOUTH EVERY DAY AT BEDTIME FOR 90 DAYS for 90   Yes [provider]  Multiple Vitamins-Minerals (MULTIVITAMIN WOMEN 50+) TABS Take 1 tablet by mouth daily with breakfast.   Yes [provider]   Allergies  Allergen Reactions   Aricept  [Donepezil ] Nausea And Vomiting   Review of Systems Denies pain   Physical Exam Confused  Has R arm in a sling  No edema Abdomen not tender  Regular work of breathing  Vital Signs: BP 107/62   Pulse (!) 58   Temp 98.1 F (36.7 C) (Oral)   Resp 18   SpO2 98%  Pain Scale: 0-10   Pain Score: 0-No pain   SpO2: SpO2: 98 % O2 Device:SpO2: 98 % O2 Flow Rate: .   IO: Intake/output summary:  Intake/Output Summary (Last 24 hours) at 12/22/2023 1203 Last data filed at 12/22/2023 1038 Gross per 24 hour  Intake 80 ml  Output --  Net 80 ml    LBM:   Baseline Weight:   Most recent weight:       Palliative Assessment/Data:   PPS 40%  Time In: 12 Time Out: 1320 Time Total: 80 Greater than 50%  of this time was spent counseling and coordinating care related to the above assessment and plan.  Signed by: Lonia Serve, MD   Please contact Palliative Medicine Team phone at [phone] for questions and  concerns.  For individual provider: See Tracey

## 2023-12-22 NOTE — ED Notes (Signed)
 Patient resting in bed ongoing confusion reporting that mom and dad are still alive. Reoriented and purewick checked and diaper changed.

## 2023-12-22 NOTE — ED Notes (Signed)
 Patient was awake and alert. Patient is confused and disoriented with place and time. Patient reoriented and reassured. Patient resting comfortably, breath sounds equal and unlabored.

## 2023-12-22 NOTE — ED Notes (Signed)
 Patient refused medication

## 2023-12-23 DIAGNOSIS — M7989 Other specified soft tissue disorders: Secondary | ICD-10-CM | POA: Diagnosis not present

## 2023-12-23 DIAGNOSIS — R079 Chest pain, unspecified: Secondary | ICD-10-CM | POA: Diagnosis not present

## 2023-12-23 DIAGNOSIS — Z7901 Long term (current) use of anticoagulants: Secondary | ICD-10-CM | POA: Diagnosis not present

## 2023-12-23 DIAGNOSIS — S42414A Nondisplaced simple supracondylar fracture without intercondylar fracture of right humerus, initial encounter for closed fracture: Secondary | ICD-10-CM | POA: Diagnosis not present

## 2023-12-23 NOTE — ED Provider Notes (Signed)
 Emergency Medicine Observation Re-evaluation Note  Connie Snyder is a 89 y.o. female, seen on rounds today.  Pt initially presented to the ED for complaints of Chest Pain Currently, the patient is resting quietly.  I have awakened her.  The patient does not have any complaints of pain.  She reports feeling well and does not endorse chest pain, shortness of breath or other discomfort.  Patient does have a pure wick.  Patient's care nurse tech advises patient does not really ambulate.  She does do some transition to the chair and physical therapy gets her up at bedside.  Physical Exam  BP 119/73 (BP Location: Left Arm)   Pulse 61   Temp 98.5 F (36.9 C) (Oral)   Resp 17   SpO2 99%  Physical Exam General: Resting quietly but when awake is calm and interactive. Cardiac: Regular. Lungs: Clear to auscultation Psych: Calm and cooperative Abdomen: Soft nondistended.  Patient is wearing a diaper which was removed and  sacrum examined.  Patient has some hyperpigmentation over the sacrum.  There is a dry, 1.5 cm sore.  This is superficial and not eroded.  This is not into the deeper dermis level. Lower extremities: Patient does not have any significant peripheral edema.  Calves are soft and pliable.  Feet examined.  Patient does not have any heel sores or appearance of pressure to the heels.  She does move her leg somewhat she can straighten and extend them although she tends to keep them in a flexed position when she is lying down.  Patient is wearing a cast on the right upper extremity.  This is clean and there is no swelling or appearance of discomfort with it.  ED Course / MDM  EKG:EKG Interpretation Date/Time:  Wednesday December 06 2023 20:26:08 EDT Ventricular Rate:  99 PR Interval:  150 QRS Duration:  113 QT Interval:  374 QTC Calculation: 480 R Axis:   -37  Text Interpretation: Sinus rhythm LVH with IVCD, LAD and secondary repol abnrm Anterior ST elevation, probably due to LVH  Confirmed by Laurice Coy [phone]) on 12/06/2023 8:39:58 PM  I have reviewed the labs performed to date as well as medications administered while in observation.  Recent changes in the last 24 hours include none.  Plan  Current plan is for awaiting placement.  Patient has some mild symptoms of pressure sore development on the sacrum.  I have ordered a foam pad for the sacrum and consulted TOC to assist in a air mattress for pressure changes.  Nursing staff advises they are rolling the patient and propping her with towels to reposition.  Patient is getting up with PT to bedside.  I have requested patient be transition to recliner chair couple times a day.    Armenta Canning, MD 12/23/23 1420

## 2023-12-24 DIAGNOSIS — Z7901 Long term (current) use of anticoagulants: Secondary | ICD-10-CM | POA: Diagnosis not present

## 2023-12-24 DIAGNOSIS — M7989 Other specified soft tissue disorders: Secondary | ICD-10-CM | POA: Diagnosis not present

## 2023-12-24 DIAGNOSIS — S42414A Nondisplaced simple supracondylar fracture without intercondylar fracture of right humerus, initial encounter for closed fracture: Secondary | ICD-10-CM | POA: Diagnosis not present

## 2023-12-24 DIAGNOSIS — R079 Chest pain, unspecified: Secondary | ICD-10-CM | POA: Diagnosis not present

## 2023-12-24 NOTE — ED Provider Notes (Signed)
 Emergency Medicine Observation Re-evaluation Note  Connie Snyder is a 89 y.o. female, seen on rounds today.  Pt initially presented to the ED for complaints of Chest Pain Currently, the patient is resting .  Physical Exam  BP 121/70 (BP Location: Left Arm)   Pulse 62   Temp 98.5 F (36.9 C) (Oral)   Resp 16   SpO2 100%  Physical Exam General: nad Cardiac: rr Lungs: non labored Psych: calm   ED Course / MDM  EKG:EKG Interpretation Date/Time:  Wednesday December 06 2023 20:26:08 EDT Ventricular Rate:  99 PR Interval:  150 QRS Duration:  113 QT Interval:  374 QTC Calculation: 480 R Axis:   -37  Text Interpretation: Sinus rhythm LVH with IVCD, LAD and secondary repol abnrm Anterior ST elevation, probably due to LVH Confirmed by Laurice Coy [phone]) on 12/06/2023 8:39:58 PM  I have reviewed the labs performed to date as well as medications administered while in observation.  Recent changes in the last 24 hours include none.  Plan  Current plan is for placement.    Neysa Caron JINNY, DO 12/24/23 1430

## 2023-12-24 NOTE — ED Notes (Signed)
 Patient resting in bed quietly, diaper changed and adjusted, purewick checked.

## 2023-12-25 DIAGNOSIS — S42414A Nondisplaced simple supracondylar fracture without intercondylar fracture of right humerus, initial encounter for closed fracture: Secondary | ICD-10-CM | POA: Diagnosis not present

## 2023-12-25 DIAGNOSIS — R079 Chest pain, unspecified: Secondary | ICD-10-CM | POA: Diagnosis not present

## 2023-12-25 DIAGNOSIS — M7989 Other specified soft tissue disorders: Secondary | ICD-10-CM | POA: Diagnosis not present

## 2023-12-25 DIAGNOSIS — Z7901 Long term (current) use of anticoagulants: Secondary | ICD-10-CM | POA: Diagnosis not present

## 2023-12-25 NOTE — ED Notes (Signed)
 Patient resting in bed breathing eyes closed chest rising and falling.

## 2023-12-25 NOTE — ED Notes (Signed)
 Patient resting in bed.

## 2023-12-25 NOTE — ED Notes (Signed)
 Patient resting in bed breathing watch TV

## 2023-12-25 NOTE — Plan of Care (Signed)
 PMT no charge note.   Call placed and was able to reach son Mr Carvey. He informed me that he is in the process of preparing guardianship paperwork and that his sister who lives in [place] is overseeing the [employer] application.   Patient's chart reviewed.  TOC working on securing a safe disposition.   No new inpatient palliative care specific recommendations at this time.  Recommend outpatient palliative care, as an additional layer of support at the patient's next location.  No charge Lonia Serve MD [facility].

## 2023-12-25 NOTE — ED Notes (Signed)
 Patient resting in bed watching TV

## 2023-12-25 NOTE — Progress Notes (Signed)
 PHYSICAL THERAPY  Pt in bed, pleasant and engaging.  Declined all attempts to get OOB.  Get in recliner to eat lunch.  Go to the bathroom.  Pt stated she walked earlier.  That's how I got here, stated Pt.  Pt stated, I don't have a problem walking but declined to show me. Pt believes she is currently at her Grandmothers house and stated she lives with her Mom and Dad.  Unable to redirect.   Rehab Team to continue to attempt and see another day.  TOC working on placement.  Katheryn Leap  PTA Acute  Rehabilitation Services Office M-F          [phone]

## 2023-12-25 NOTE — ED Provider Notes (Signed)
 Emergency Medicine Observation Re-evaluation Note  Connie Snyder is a 89 y.o. female, seen on rounds today.  Pt initially presented to the ED for complaints of Chest Pain Currently, the patient is sleeping.  Physical Exam  BP 111/63 (BP Location: Left Arm)   Pulse (!) 55   Temp 98.1 F (36.7 C) (Oral)   Resp 16   SpO2 100%  Physical Exam General: no acute distress, sleeping Cardiac: well perfused Lungs: no respiratory distress Psych: no acute distress, sleeping  ED Course / MDM  EKG:EKG Interpretation Date/Time:  Wednesday December 06 2023 20:26:08 EDT Ventricular Rate:  99 PR Interval:  150 QRS Duration:  113 QT Interval:  374 QTC Calculation: 480 R Axis:   -37  Text Interpretation: Sinus rhythm LVH with IVCD, LAD and secondary repol abnrm Anterior ST elevation, probably due to LVH Confirmed by Laurice Coy [phone]) on 12/06/2023 8:39:58 PM  I have reviewed the labs performed to date as well as medications administered while in observation.  Recent changes in the last 24 hours include none.  Plan  Current plan is for placement.    Ula Prentice SAUNDERS, MD 12/25/23 [phone]

## 2023-12-25 NOTE — Progress Notes (Signed)
 Called placed to pt son no answer, unable to leave vm. CSW called pt dtr to f/u on status of medicaid and guardianship application. Per [name], family had not started medicaid application. Connie Snyder stated family was told by a friend that pt would not qualify, so dtr did not start application. CSW advised dtr to move forward w/ completion of application for medicaid as previously recommended. Dtr stated that she was unsure of guardianship status, says her brother told her he had some papers to submit, but she was unsure if completed. CSW emphasized the importance of completing both medicaid application and guardianship paperwork immediately. Will f/u.

## 2023-12-25 NOTE — ED Notes (Signed)
 Patient resting in bed breathing eyes closed

## 2023-12-25 NOTE — ED Notes (Signed)
 Patient son here to visit her

## 2023-12-25 NOTE — Progress Notes (Signed)
 Son reports he is in the process of completing the guardianship paperwork today and states that his sister is helping him out with the Medicaid application. He will follow up with her today to be sure it has been completed.

## 2023-12-26 DIAGNOSIS — M7989 Other specified soft tissue disorders: Secondary | ICD-10-CM | POA: Diagnosis not present

## 2023-12-26 DIAGNOSIS — R079 Chest pain, unspecified: Secondary | ICD-10-CM | POA: Diagnosis not present

## 2023-12-26 DIAGNOSIS — S42414A Nondisplaced simple supracondylar fracture without intercondylar fracture of right humerus, initial encounter for closed fracture: Secondary | ICD-10-CM | POA: Diagnosis not present

## 2023-12-26 DIAGNOSIS — Z7901 Long term (current) use of anticoagulants: Secondary | ICD-10-CM | POA: Diagnosis not present

## 2023-12-26 NOTE — Progress Notes (Signed)
 CSW held conference call with both pt's adult children - Addaline Peplinski and Zada Molly. CSW reiterated what evening CSW encouraged: to apply for Medicaid and if she does not qualify, seek advisement on how she can become eligible. Joy verbalized understanding and Jerona stated he will provide Joy with the information needed such as pt's income, etc.   Jerona reported he is currently at [employer] and has 2 consultations: one for guardianship and the other for legal advice. Jerona stated he was advised to seek legal advice before pursuing guardianship. Jerona note he has the paperwork and will complete it once questions are answered on the consultation but did acknowledge a barrier is the $150 fee in which he'll have to gather money for. The fee must be submitted with the paperwork to the courts.   If patient does not qualify for Medicaid and has money (for example: retirement) then family would need to use it to spend down. The spend down must be related to the pt's care. Hospital leadership.

## 2023-12-26 NOTE — ED Provider Notes (Signed)
 Emergency Medicine Observation Re-evaluation Note  Connie Snyder is a 89 y.o. female, seen on rounds today.  Pt initially presented to the ED for complaints of dementia, has been boarding in ED for possible placement. No new c/o this AM.   Physical Exam  BP 104/72 (BP Location: Left Arm)   Pulse 78   Temp 98.1 F (36.7 C) (Oral)   Resp 16   SpO2 100%  Physical Exam General: resting.  Cardiac: regular rate.  Lungs: breathing comfortably. Psych: calm.  ED Course / MDM   I have reviewed the labs performed to date as well as medications administered while in observation.  Recent changes in the last 24 hours include ED obs, reassessment.  Plan  Current plan is for TOC placement.  It appears progress has been slow as after 400+ hours in ED, still talks of family completing guardian paperwork and medicaid application - will escalate to [facility] leadership as this does not appear in patients best interest to continue to board in ED setting indefinitely.   DIspo per [facility] team.       Bernard Drivers, MD 12/26/23 [phone]

## 2023-12-27 DIAGNOSIS — R079 Chest pain, unspecified: Secondary | ICD-10-CM | POA: Diagnosis not present

## 2023-12-27 DIAGNOSIS — M7989 Other specified soft tissue disorders: Secondary | ICD-10-CM | POA: Diagnosis not present

## 2023-12-27 DIAGNOSIS — S42414A Nondisplaced simple supracondylar fracture without intercondylar fracture of right humerus, initial encounter for closed fracture: Secondary | ICD-10-CM | POA: Diagnosis not present

## 2023-12-27 DIAGNOSIS — Z7901 Long term (current) use of anticoagulants: Secondary | ICD-10-CM | POA: Diagnosis not present

## 2023-12-27 NOTE — Progress Notes (Signed)
 CSW spoke with pt's daughter, Zada, who stated she started filling out the Medicaid application online- she is awaiting her brother, Jerona, to provide her the exact amount of pt's social security and pension. CSW encouraged Joy to follow up with Jerona to receive this information so we can move forward with other options for transitioning the pt.   CSW outreached to [place] who reported his questions were answered and that he plans to file for general guardianship. He stated that [facility] called him yesterday and informed him to get POA but he will notify them today he is pursuing general guardianship. He plans to submit that paperwork this afternoon.   CSW encouraged Jerona to provide the income information to his sister as soon as possible so Medicaid will be able to tell whether the pt qualifies or not. CSW did inform Jerona that if she does not qualify, she will likely have to spend down and they would have to use that money to pay for her care. Jerona verbalized understanding. CSW following.

## 2023-12-27 NOTE — Progress Notes (Addendum)
-  late entry-  Son reported he was at court house submitting paperwork for guardianship at 2:59PM.

## 2023-12-27 NOTE — ED Provider Notes (Signed)
 Emergency Medicine Observation Re-evaluation Note  Connie Snyder is a 89 y.o. female, seen on rounds today.  Pt initially presented to the ED for complaints of Chest Pain Currently, the patient is SNF placement.  Patient is resting quietly  Physical Exam  BP 118/83 (BP Location: Left Arm)   Pulse 60   Temp 97.8 F (36.6 C) (Oral)   Resp 16   SpO2 100%  Physical Exam General: Resting quietly Cardiac: Regular Lungs: Clear Psych: Calm  ED Course / MDM  EKG:EKG Interpretation Date/Time:  Wednesday December 06 2023 20:26:08 EDT Ventricular Rate:  99 PR Interval:  150 QRS Duration:  113 QT Interval:  374 QTC Calculation: 480 R Axis:   -37  Text Interpretation: Sinus rhythm LVH with IVCD, LAD and secondary repol abnrm Anterior ST elevation, probably due to LVH Confirmed by Laurice Coy [phone]) on 12/06/2023 8:39:58 PM  I have reviewed the labs performed to date as well as medications administered while in observation.  Recent changes in the last 24 hours include none.  Plan  Current plan is for placement.    Armenta Canning, MD 12/27/23 1225

## 2023-12-28 DIAGNOSIS — S42414A Nondisplaced simple supracondylar fracture without intercondylar fracture of right humerus, initial encounter for closed fracture: Secondary | ICD-10-CM | POA: Diagnosis not present

## 2023-12-28 DIAGNOSIS — M7989 Other specified soft tissue disorders: Secondary | ICD-10-CM | POA: Diagnosis not present

## 2023-12-28 DIAGNOSIS — Z7901 Long term (current) use of anticoagulants: Secondary | ICD-10-CM | POA: Diagnosis not present

## 2023-12-28 DIAGNOSIS — R079 Chest pain, unspecified: Secondary | ICD-10-CM | POA: Diagnosis not present

## 2023-12-28 NOTE — ED Notes (Signed)
 Patient is awake. Patient is upset and confused. Patient trying to talk to people not there.  Patient reassured and distraction tried. Connie Snyder

## 2023-12-28 NOTE — ED Notes (Signed)
 Patient was medication compliant. Pleasantly confused. Patient needs assist with ADLs.  Patient talking to people no there.

## 2023-12-28 NOTE — ED Provider Notes (Signed)
 Emergency Medicine Observation Re-evaluation Note  Connie Snyder is a 89 y.o. female, seen on rounds today.  Pt initially presented to the ED for complaints of Chest Pain Currently, the patient is asleep.  Physical Exam  BP 108/71 (BP Location: Left Arm)   Pulse (!) 56   Temp 97.6 F (36.4 C) (Oral)   Resp 16   SpO2 97%  Physical Exam General: No acute distress Cardiac: Well-perfused Lungs: Nonlabored Psych: Unable to assess  ED Course / MDM  EKG:EKG Interpretation Date/Time:  Wednesday December 06 2023 20:26:08 EDT Ventricular Rate:  99 PR Interval:  150 QRS Duration:  113 QT Interval:  374 QTC Calculation: 480 R Axis:   -37  Text Interpretation: Sinus rhythm LVH with IVCD, LAD and secondary repol abnrm Anterior ST elevation, probably due to LVH Confirmed by Laurice Coy [phone]) on 12/06/2023 8:39:58 PM  I have reviewed the labs performed to date as well as medications administered while in observation.  Recent changes in the last 24 hours include social work working on placement.  Guardianship application.  Plan  Current plan is for placement.    Towana Ozell BROCKS, MD 12/28/23 [phone]

## 2023-12-29 DIAGNOSIS — M7989 Other specified soft tissue disorders: Secondary | ICD-10-CM | POA: Diagnosis not present

## 2023-12-29 DIAGNOSIS — S42414A Nondisplaced simple supracondylar fracture without intercondylar fracture of right humerus, initial encounter for closed fracture: Secondary | ICD-10-CM | POA: Diagnosis not present

## 2023-12-29 DIAGNOSIS — Z7901 Long term (current) use of anticoagulants: Secondary | ICD-10-CM | POA: Diagnosis not present

## 2023-12-29 DIAGNOSIS — R079 Chest pain, unspecified: Secondary | ICD-10-CM | POA: Diagnosis not present

## 2023-12-29 NOTE — Progress Notes (Signed)
 Physical Therapy Discharge Patient Details Name: Connie Snyder MRN: [id number] DOB: 06/11/1927 Today's Date: 12/29/2023 Time:  -     Patient discharged from PT services secondary to patient has refused 3 (three) consecutive times without medical reason.  Please see latest therapy progress note for current level of functioning and progress toward goals. Patient is  pleasantly confused. States that she has to have approval from her parents before she can do anything or she may be punished. Therapist even   pretended to speak to father, [name] then patient stated her physician had to give approval. No  success to engage patient  to mobilize.   Progress and discharge plan discussed with patient and/or caregiver: Patient unable to participate in discharge planning and no caregivers available  GP    Darice Potters PT Acute Rehabilitation Services Office [phone]  [name] 12/29/2023, 3:51 PM

## 2023-12-29 NOTE — TOC Progression Note (Addendum)
 Transition of Care [facility]) - Progression Note    Patient Details  Name: Connie Snyder MRN: [id number] Date of Birth: May 19, 1927  Transition of Care [facility]) CM/SW Contact  Lorraine LILLETTE Fenton, [place] Phone Number: 12/29/2023, 5:15 PM  Clinical Narrative:    ICM  Consult for Air mattress/special pad for bedsore protection- acknowledged, CSW will consult with RN CM to determine product to order. ICM following.   Addendum:  RNCM with ICM advised to reach out to ED RN team- whoever provides  bed equipment _ material management or portable equipment- at the hospital we would have to fulfill this request, RN agreed to order.    Barriers to Discharge: Continued Medical Work up               Expected Discharge Plan and Services                                               Social Drivers of Health (SDOH) Interventions SDOH Screenings   Food Insecurity: Patient Unable To Answer (03/30/2023)  Housing: Patient Unable To Answer (03/30/2023)  Transportation Needs: Patient Unable To Answer (03/30/2023)  Utilities: Patient Unable To Answer (03/30/2023)  Social Connections: Unknown (04/03/2023)  Tobacco Use: Medium Risk (12/06/2023)    Readmission Risk Interventions     No data to display

## 2023-12-29 NOTE — Progress Notes (Signed)
 Pt's son reports he has submitted guardianship paperwork and has a court date arranged.   Pt's daughter, Zada, reported no one from DSS called her. CSW spoke with DSS Representative Ms. Lang who confirmed pt's phone number and was going to make sure the [facility] eligibility caseworker contacted her. CSW will outreach to Ms. Robinson at the start of their business hours.

## 2023-12-29 NOTE — ED Provider Notes (Signed)
 Emergency Medicine Observation Re-evaluation Note  Connie Snyder is a 89 y.o. female, seen on rounds today.  Pt initially presented to the ED for complaints of Chest Pain Currently, the patient is sleeping.  Physical Exam  BP 117/78 (BP Location: Right Arm)   Pulse 69   Temp 97.8 F (36.6 C) (Oral)   Resp 18   SpO2 100%  Physical Exam General: Sleeping Cardiac: Regular Lungs: Clear Psych: Calm and cooperative  ED Course / MDM  EKG:EKG Interpretation Date/Time:  Wednesday December 06 2023 20:26:08 EDT Ventricular Rate:  99 PR Interval:  150 QRS Duration:  113 QT Interval:  374 QTC Calculation: 480 R Axis:   -37  Text Interpretation: Sinus rhythm LVH with IVCD, LAD and secondary repol abnrm Anterior ST elevation, probably due to LVH Confirmed by Laurice Coy [phone]) on 12/06/2023 8:39:58 PM  I have reviewed the labs performed to date as well as medications administered while in observation.  Recent changes in the last 24 hours include none.  Plan  Current plan is for placement.    Doretha Folks, MD 12/29/23 [phone]

## 2023-12-30 DIAGNOSIS — S42414A Nondisplaced simple supracondylar fracture without intercondylar fracture of right humerus, initial encounter for closed fracture: Secondary | ICD-10-CM | POA: Diagnosis not present

## 2023-12-30 DIAGNOSIS — M7989 Other specified soft tissue disorders: Secondary | ICD-10-CM | POA: Diagnosis not present

## 2023-12-30 DIAGNOSIS — R079 Chest pain, unspecified: Secondary | ICD-10-CM | POA: Diagnosis not present

## 2023-12-30 DIAGNOSIS — Z7901 Long term (current) use of anticoagulants: Secondary | ICD-10-CM | POA: Diagnosis not present

## 2023-12-30 NOTE — ED Notes (Signed)
 Pt sleeping, visible rise and fall of chest. No sitter at this time. This RN watching and caring for pt.

## 2023-12-30 NOTE — ED Notes (Signed)
 Patient resting in bed breathing eyes closed chest rising and falling.

## 2023-12-30 NOTE — ED Provider Notes (Signed)
 Emergency Medicine Observation Re-evaluation Note  Connie Snyder is a 89 y.o. female, seen on rounds today.  Pt initially presented to the ED for complaints of Chest Pain Currently, the patient is resting.  Physical Exam  BP 120/80 (BP Location: Left Arm)   Pulse (!) 57   Temp 98.2 F (36.8 C) (Oral)   Resp 16   SpO2 100%  Physical Exam General: NAD   ED Course / MDM  EKG:EKG Interpretation Date/Time:  Wednesday December 06 2023 20:26:08 EDT Ventricular Rate:  99 PR Interval:  150 QRS Duration:  113 QT Interval:  374 QTC Calculation: 480 R Axis:   -37  Text Interpretation: Sinus rhythm LVH with IVCD, LAD and secondary repol abnrm Anterior ST elevation, probably due to LVH Confirmed by Laurice Coy [phone]) on 12/06/2023 8:39:58 PM  I have reviewed the labs performed to date as well as medications administered while in observation.  Recent changes in the last 24 hours include no acute events reported.  Plan  Current plan is for placement.    Laurice Coy BROCKS, MD 12/30/23 [phone]

## 2023-12-31 DIAGNOSIS — Z7901 Long term (current) use of anticoagulants: Secondary | ICD-10-CM | POA: Diagnosis not present

## 2023-12-31 DIAGNOSIS — R079 Chest pain, unspecified: Secondary | ICD-10-CM | POA: Diagnosis not present

## 2023-12-31 DIAGNOSIS — M7989 Other specified soft tissue disorders: Secondary | ICD-10-CM | POA: Diagnosis not present

## 2023-12-31 DIAGNOSIS — S42414A Nondisplaced simple supracondylar fracture without intercondylar fracture of right humerus, initial encounter for closed fracture: Secondary | ICD-10-CM | POA: Diagnosis not present

## 2023-12-31 NOTE — ED Provider Notes (Signed)
 Emergency Medicine Observation Re-evaluation Note  Connie Snyder is a 89 y.o. female, seen on rounds today.  Pt initially presented to the ED for complaints of Chest Pain Currently, the patient is resting comfortably.  Patient was evaluated for chest pain yesterday.  Cleared from that.  Still awaiting placement.  Physical Exam  BP 118/79 (BP Location: Left Arm)   Pulse 61   Temp 98.1 F (36.7 C) (Oral)   Resp 18   SpO2 100%  Physical Exam General: Nontoxic no acute distress Cardiac:  Lungs: No respiratory Psych: Baseline  ED Course / MDM  EKG:EKG Interpretation Date/Time:  Wednesday December 06 2023 20:26:08 EDT Ventricular Rate:  99 PR Interval:  150 QRS Duration:  113 QT Interval:  374 QTC Calculation: 480 R Axis:   -37  Text Interpretation: Sinus rhythm LVH with IVCD, LAD and secondary repol abnrm Anterior ST elevation, probably due to LVH Confirmed by Laurice Coy [phone]) on 12/06/2023 8:39:58 PM  I have reviewed the labs performed to date as well as medications administered while in observation.  Recent changes in the last 24 hours include evaluation for some chest pain without any acute findings.  Plan  Current plan is for facility placement.    Keelee Yankey, MD 12/31/23 [phone]

## 2023-12-31 NOTE — ED Notes (Signed)
Writer repositioned pt for comfort.

## 2023-12-31 NOTE — ED Notes (Addendum)
 Writer helped pt transition to recliner to sit up for a little while.

## 2023-12-31 NOTE — ED Notes (Signed)
 Pt became very anxious and was yelling out for help. Pt advised she wanted her cast cut off and was saying her blood pressure was flowing everywhere. Pt was given medication for anxiety and for pain.

## 2023-12-31 NOTE — ED Notes (Signed)
 Writer fed pt her meds via applesauce. Pt refused breakfast.

## 2023-12-31 NOTE — Progress Notes (Signed)
 Connie Snyder reports she did submit the application; however, the DSS rep stated a signed authorized representative form was needed. The DSS rep was sending it to [name], who is local in [place].   Connie Snyder reports he has a court date for guardianship on 11/6 at [facility].

## 2023-12-31 NOTE — ED Notes (Signed)
 Writer helped pt with her dinner. Pt ate many bites of peas, mashed potatoes and [place]. Pt also had a few bites of chopped up pears.

## 2024-01-01 DIAGNOSIS — M7989 Other specified soft tissue disorders: Secondary | ICD-10-CM | POA: Diagnosis not present

## 2024-01-01 DIAGNOSIS — R079 Chest pain, unspecified: Secondary | ICD-10-CM | POA: Diagnosis not present

## 2024-01-01 DIAGNOSIS — S42414A Nondisplaced simple supracondylar fracture without intercondylar fracture of right humerus, initial encounter for closed fracture: Secondary | ICD-10-CM | POA: Diagnosis not present

## 2024-01-01 DIAGNOSIS — Z7901 Long term (current) use of anticoagulants: Secondary | ICD-10-CM | POA: Diagnosis not present

## 2024-01-01 NOTE — ED Notes (Signed)
 PT sleeping, equal rise and fall of chest

## 2024-01-01 NOTE — ED Provider Notes (Signed)
 Emergency Medicine Observation Re-evaluation Note  Connie Snyder is a 89 y.o. female, seen on rounds today.  Pt initially presented to the ED for complaints of Chest Pain Currently, the patient is resting.  Physical Exam  BP 104/85   Pulse (!) 58   Temp 97.9 F (36.6 C) (Oral)   Resp 16   SpO2 100%  Physical Exam General: NAD   ED Course / MDM  EKG:EKG Interpretation Date/Time:  Wednesday December 06 2023 20:26:08 EDT Ventricular Rate:  99 PR Interval:  150 QRS Duration:  113 QT Interval:  374 QTC Calculation: 480 R Axis:   -37  Text Interpretation: Sinus rhythm LVH with IVCD, LAD and secondary repol abnrm Anterior ST elevation, probably due to LVH Confirmed by Laurice Coy [phone]) on 12/06/2023 8:39:58 PM  I have reviewed the labs performed to date as well as medications administered while in observation.  Recent changes in the last 24 hours include no acute events reported.  Plan  Current plan is for placement.    Laurice Coy BROCKS, MD 01/01/24 [phone]

## 2024-01-01 NOTE — Progress Notes (Signed)
 CSW spoke with DSS Representative, Mylinda Essex, who confirmed the application was submitted. An authoroized representative form was sent to pt's son. CSW advised pt has a dementia diagnosis and cannot sign. Mylinda informed pt's son needed to submit paperwork that he has a hearing and that it would be acceptable.   CSW notified pt's son, Jerona. Natasha's computer is down and CSW is awaiting pending Medicaid #.

## 2024-01-01 NOTE — Consult Note (Signed)
 WOC Nurse Consult Note: Reason for Consult: sacral wound  Wound type: Stage 2 Pressure injury sacrum  Pressure Injury POA: NA  Measurement: 1 cm x 1 cm x 0.1 cm  Wound bed: pink moist  Drainage (amount, consistency, odor) serosanguinous  Periwound: intact  Dressing procedure/placement/frequency: Cleanse sacral wound with NS, apply Xeroform gauze Soila [phone]) to wound bed daily and secure with silicone foam.   POC discussed with bedside nurse. WOC team will not follow. Re-consult if further needs arise.   Thank you,    Powell Bar MSN, RN-BC, [employer]

## 2024-01-02 DIAGNOSIS — Z7901 Long term (current) use of anticoagulants: Secondary | ICD-10-CM | POA: Diagnosis not present

## 2024-01-02 DIAGNOSIS — R079 Chest pain, unspecified: Secondary | ICD-10-CM | POA: Diagnosis not present

## 2024-01-02 DIAGNOSIS — M7989 Other specified soft tissue disorders: Secondary | ICD-10-CM | POA: Diagnosis not present

## 2024-01-02 DIAGNOSIS — S42414A Nondisplaced simple supracondylar fracture without intercondylar fracture of right humerus, initial encounter for closed fracture: Secondary | ICD-10-CM | POA: Diagnosis not present

## 2024-01-02 NOTE — ED Notes (Signed)
 Sacral wound dressing changed. Xyrofoam gauze applied with silicone foam dressing.

## 2024-01-02 NOTE — ED Provider Notes (Signed)
 Emergency Medicine Observation Re-evaluation Note  Connie Snyder is a 89 y.o. female, seen on rounds today.  Pt initially presented to the ED for complaints of Chest Pain Currently, the patient is resting, comfortably.  Physical Exam  BP 117/68 (BP Location: Left Arm)   Pulse 66   Temp 98.1 F (36.7 C) (Oral)   Resp 17   SpO2 97%  Physical Exam General: Awake. Alert. No acute distress Cardiac: Regular rate rhythm Lungs: Clear to auscultation bilaterally Psych: Calm and cooperative ED Course / MDM  EKG:EKG Interpretation Date/Time:  Wednesday December 06 2023 20:26:08 EDT Ventricular Rate:  99 PR Interval:  150 QRS Duration:  113 QT Interval:  374 QTC Calculation: 480 R Axis:   -37  Text Interpretation: Sinus rhythm LVH with IVCD, LAD and secondary repol abnrm Anterior ST elevation, probably due to LVH Confirmed by Laurice Coy [phone]) on 12/06/2023 8:39:58 PM  I have reviewed the labs performed to date as well as medications administered while in observation.  Recent changes in the last 24 hours include no acute events.  Plan  Current plan is for continued boarding in the ED awaiting placement.    Pamella Ozell LABOR, DO 01/02/24 [phone]

## 2024-01-02 NOTE — Progress Notes (Addendum)
 CSW emailed Connie Snyder to follow up on request for pending Medicaid #. Awaiting response.   Addend @ 10:48AM Pending Medicaid #: [id number] Q. Case worker: Connie Snyder Email: [email or web address]

## 2024-01-02 NOTE — Progress Notes (Signed)
 [facility]: No LTC bed and does not accept Medicaid pending [facility]): requested referral be sent to [facility]: No LTC beds [facility]: Cannot bring someone in LTC, must admit STR and transition

## 2024-01-03 DIAGNOSIS — R079 Chest pain, unspecified: Secondary | ICD-10-CM | POA: Diagnosis not present

## 2024-01-03 DIAGNOSIS — M7989 Other specified soft tissue disorders: Secondary | ICD-10-CM | POA: Diagnosis not present

## 2024-01-03 DIAGNOSIS — S42414A Nondisplaced simple supracondylar fracture without intercondylar fracture of right humerus, initial encounter for closed fracture: Secondary | ICD-10-CM | POA: Diagnosis not present

## 2024-01-03 DIAGNOSIS — Z7901 Long term (current) use of anticoagulants: Secondary | ICD-10-CM | POA: Diagnosis not present

## 2024-01-03 NOTE — ED Provider Notes (Signed)
 Emergency Medicine Observation Re-evaluation Note  Connie Snyder is a 89 y.o. female, seen on rounds today.  Pt initially presented to the ED for complaints of Chest Pain Currently, the patient is resting, comfortably.  Physical Exam  BP 120/86 (BP Location: Left Arm)   Pulse (!) 56   Temp 98.6 F (37 C) (Oral)   Resp 16   SpO2 100%  Physical Exam General: NAD ED Course / MDM  EKG:EKG Interpretation Date/Time:  Wednesday December 06 2023 20:26:08 EDT Ventricular Rate:  99 PR Interval:  150 QRS Duration:  113 QT Interval:  374 QTC Calculation: 480 R Axis:   -37  Text Interpretation: Sinus rhythm LVH with IVCD, LAD and secondary repol abnrm Anterior ST elevation, probably due to LVH Confirmed by Laurice Coy [phone]) on 12/06/2023 8:39:58 PM  I have reviewed the labs performed to date as well as medications administered while in observation.  Recent changes in the last 24 hours include none.  Plan  Current plan is for continued boarding in the ED awaiting placement.        Jerrol Agent, MD 01/03/24 [phone]

## 2024-01-03 NOTE — Progress Notes (Addendum)
 [facility] does not have any LTC beds available. Outreached to [employer] to inquire about LTC beds.    CSW attempted to contact pt's son, Connie Snyder, to discuss [facility] potentially being a placement. Per Madelin Jacobus [phone]), a lot of information regarding finances would have to be provided up front before she can present referral to team for clinical review.   Addend @ 11:17AM Connie Snyder is unable to accept Medicaid pending.   Addend @ 1:14PM CSW spoke with Connie Snyder to inquire abut whether he is interested in possible placement in [place]. CSW did advise if not, home care will need to be arranged until Medicaid is approved and she can get into a facility. Connie Snyder stated he is open to her going to [place]. CSW forwarded Connie Snyder the information requested by [employer]. Connie Snyder states he will speak with his sister when she gets off work. ICM following.  Tammy requested info on: -pt's monthly income and exact amount in pt's bank account -who pt banks with -5 years of bank statements -if pt has a payee -copies of all life insurance policies  Son confirmed the hearing for guardianship is 11/6 at 9AM.

## 2024-01-04 DIAGNOSIS — M7989 Other specified soft tissue disorders: Secondary | ICD-10-CM | POA: Diagnosis not present

## 2024-01-04 DIAGNOSIS — S42414A Nondisplaced simple supracondylar fracture without intercondylar fracture of right humerus, initial encounter for closed fracture: Secondary | ICD-10-CM | POA: Diagnosis not present

## 2024-01-04 DIAGNOSIS — R079 Chest pain, unspecified: Secondary | ICD-10-CM | POA: Diagnosis not present

## 2024-01-04 DIAGNOSIS — Z7901 Long term (current) use of anticoagulants: Secondary | ICD-10-CM | POA: Diagnosis not present

## 2024-01-04 NOTE — ED Provider Notes (Signed)
 Emergency Medicine Observation Re-evaluation Note  Connie Snyder is a 89 y.o. female, seen on rounds today.  Pt initially presented to the ED for complaints of Chest Pain Currently, the patient is awaiting placement.  Physical Exam  BP 95/69 (BP Location: Left Arm)   Pulse 68   Temp 98 F (36.7 C) (Axillary)   Resp 19   SpO2 100%  Physical Exam General: NAD Cardiac: RR Lungs: even unlabored Psych: NA  ED Course / MDM  EKG:EKG Interpretation Date/Time:  Wednesday December 06 2023 20:26:08 EDT Ventricular Rate:  99 PR Interval:  150 QRS Duration:  113 QT Interval:  374 QTC Calculation: 480 R Axis:   -37  Text Interpretation: Sinus rhythm LVH with IVCD, LAD and secondary repol abnrm Anterior ST elevation, probably due to LVH Confirmed by Laurice Coy [phone]) on 12/06/2023 8:39:58 PM  I have reviewed the labs performed to date as well as medications administered while in observation.  Recent changes in the last 24 hours include none.  Plan  Current plan is for placement.    Dreama Longs, MD 01/04/24 [phone]

## 2024-01-04 NOTE — Progress Notes (Addendum)
 Spoke to Tammy with [facility] who reports she has spoken to pt's son and they are reviewing.   Julien Das, MSW, LCSW [phone] (coverage)

## 2024-01-05 DIAGNOSIS — M7989 Other specified soft tissue disorders: Secondary | ICD-10-CM | POA: Diagnosis not present

## 2024-01-05 DIAGNOSIS — Z7901 Long term (current) use of anticoagulants: Secondary | ICD-10-CM | POA: Diagnosis not present

## 2024-01-05 DIAGNOSIS — R079 Chest pain, unspecified: Secondary | ICD-10-CM | POA: Diagnosis not present

## 2024-01-05 DIAGNOSIS — S42414A Nondisplaced simple supracondylar fracture without intercondylar fracture of right humerus, initial encounter for closed fracture: Secondary | ICD-10-CM | POA: Diagnosis not present

## 2024-01-05 MED ORDER — RISPERIDONE 0.5 MG PO TBDP
1.0000 mg | ORAL_TABLET | Freq: Once | ORAL | Status: AC
  Administered 2024-01-05: 1 mg via ORAL
  Filled 2024-01-05: qty 2

## 2024-01-05 NOTE — ED Notes (Signed)
 Pt sleeping today. Woke up for toiletting and meals. Bandage changed.

## 2024-01-05 NOTE — ED Provider Notes (Signed)
 Emergency Medicine Observation Re-evaluation Note  Connie Snyder is a 89 y.o. female, seen on rounds today.  Pt initially presented to the ED for complaints of Chest Pain Currently, the patient is awaiting placement.  Physical Exam  BP 107/69 (BP Location: Left Arm)   Pulse (!) 56   Temp 97.9 F (36.6 C) (Oral)   Resp 19   SpO2 100%  Physical Exam Alert and in no acute distress ED Course / MDM  EKG:EKG Interpretation Date/Time:  Wednesday December 06 2023 20:26:08 EDT Ventricular Rate:  99 PR Interval:  150 QRS Duration:  113 QT Interval:  374 QTC Calculation: 480 R Axis:   -37  Text Interpretation: Sinus rhythm LVH with IVCD, LAD and secondary repol abnrm Anterior ST elevation, probably due to LVH Confirmed by Laurice Coy [phone]) on 12/06/2023 8:39:58 PM  I have reviewed the labs performed to date as well as medications administered while in observation.  Recent changes in the last 24 hours include none.  Plan  Current plan is for placement.    Suzette Pac, MD 01/05/24 [phone]

## 2024-01-05 NOTE — Progress Notes (Signed)
 Spoke to [place] with [facility] who reports their business office has been in touch with pt's son Jerona who will have requested financial information to them by Monday.   Julien Das, MSW, LCSW [phone] (coverage)

## 2024-01-06 DIAGNOSIS — S42414A Nondisplaced simple supracondylar fracture without intercondylar fracture of right humerus, initial encounter for closed fracture: Secondary | ICD-10-CM | POA: Diagnosis not present

## 2024-01-06 DIAGNOSIS — Z7901 Long term (current) use of anticoagulants: Secondary | ICD-10-CM | POA: Diagnosis not present

## 2024-01-06 DIAGNOSIS — M7989 Other specified soft tissue disorders: Secondary | ICD-10-CM | POA: Diagnosis not present

## 2024-01-06 DIAGNOSIS — R079 Chest pain, unspecified: Secondary | ICD-10-CM | POA: Diagnosis not present

## 2024-01-06 NOTE — ED Provider Notes (Signed)
 Emergency Medicine Observation Re-evaluation Note  Connie Snyder is a 89 y.o. female, seen on rounds today.  Pt initially presented to the ED for complaints of Chest Pain Currently, the patient is currently awaiting placement. .  Physical Exam  BP 119/76 (BP Location: Left Arm)   Pulse (!) 52   Temp 97.7 F (36.5 C) (Oral)   Resp 16   SpO2 100%  Physical Exam General: NAD   ED Course / MDM  EKG:EKG Interpretation Date/Time:  Wednesday December 06 2023 20:26:08 EDT Ventricular Rate:  99 PR Interval:  150 QRS Duration:  113 QT Interval:  374 QTC Calculation: 480 R Axis:   -37  Text Interpretation: Sinus rhythm LVH with IVCD, LAD and secondary repol abnrm Anterior ST elevation, probably due to LVH Confirmed by Laurice Coy [phone]) on 12/06/2023 8:39:58 PM  I have reviewed the labs performed to date as well as medications administered while in observation.  Recent changes in the last 24 hours include none .  Plan  Current plan is for placement. Connie Simon Lavonia LOISE, MD 01/06/24 [phone]

## 2024-01-07 DIAGNOSIS — R079 Chest pain, unspecified: Secondary | ICD-10-CM | POA: Diagnosis not present

## 2024-01-07 DIAGNOSIS — Z7901 Long term (current) use of anticoagulants: Secondary | ICD-10-CM | POA: Diagnosis not present

## 2024-01-07 DIAGNOSIS — S42414A Nondisplaced simple supracondylar fracture without intercondylar fracture of right humerus, initial encounter for closed fracture: Secondary | ICD-10-CM | POA: Diagnosis not present

## 2024-01-07 DIAGNOSIS — M7989 Other specified soft tissue disorders: Secondary | ICD-10-CM | POA: Diagnosis not present

## 2024-01-07 NOTE — ED Notes (Signed)
 Patient son here visiting her at this time

## 2024-01-07 NOTE — ED Notes (Signed)
 Patient sitting in chair in room at this time.

## 2024-01-07 NOTE — ED Provider Notes (Signed)
 Emergency Medicine Observation Re-evaluation Note  Connie Snyder is a 89 y.o. female, seen on rounds today.  Pt initially presented to the ED for complaints of Chest Pain Currently, the patient is asleep.  Physical Exam  BP 102/62 (BP Location: Left Arm)   Pulse (!) 58   Temp 97.8 F (36.6 C) (Axillary)   Resp 17   SpO2 100%  Physical Exam General: asleep Cardiac: asleep Lungs: asleep Psych: asleep  ED Course / MDM  EKG:EKG Interpretation Date/Time:  Wednesday December 06 2023 20:26:08 EDT Ventricular Rate:  99 PR Interval:  150 QRS Duration:  113 QT Interval:  374 QTC Calculation: 480 R Axis:   -37  Text Interpretation: Sinus rhythm LVH with IVCD, LAD and secondary repol abnrm Anterior ST elevation, probably due to LVH Confirmed by Laurice Coy [phone]) on 12/06/2023 8:39:58 PM  I have reviewed the labs performed to date as well as medications administered while in observation.  No recent changes in the last 24 hours.  Plan  Current plan is for placement.    Freddi Hamilton, MD 01/07/24 [phone]

## 2024-01-08 DIAGNOSIS — R079 Chest pain, unspecified: Secondary | ICD-10-CM | POA: Diagnosis not present

## 2024-01-08 DIAGNOSIS — M7989 Other specified soft tissue disorders: Secondary | ICD-10-CM | POA: Diagnosis not present

## 2024-01-08 DIAGNOSIS — S42414A Nondisplaced simple supracondylar fracture without intercondylar fracture of right humerus, initial encounter for closed fracture: Secondary | ICD-10-CM | POA: Diagnosis not present

## 2024-01-08 DIAGNOSIS — Z7901 Long term (current) use of anticoagulants: Secondary | ICD-10-CM | POA: Diagnosis not present

## 2024-01-08 NOTE — ED Notes (Signed)
 Patient resting in bed chest rising and falling.

## 2024-01-08 NOTE — Plan of Care (Signed)
 PMT no charge note.   Patient's chart reviewed.  TOC working on securing a safe disposition. Guardianship hearing scheduled for next month.   No new inpatient palliative care specific recommendations at this time.  Recommend outpatient palliative care, as an additional layer of support at the patient's next location.  No charge Lonia Serve MD [facility].

## 2024-01-08 NOTE — ED Notes (Signed)
 Patient resting in chair in room watching TV

## 2024-01-08 NOTE — ED Notes (Signed)
 Patient resting in bed breathing eyes closed chest rising and falling.

## 2024-01-08 NOTE — ED Notes (Signed)
 Patient resting in bed breathing eyes closed chest rising and falling at this time

## 2024-01-08 NOTE — ED Notes (Signed)
 Patient resting in chair in room at this time watching TV

## 2024-01-08 NOTE — ED Notes (Signed)
 Patient assisted from the chair to the bed at this time.

## 2024-01-08 NOTE — ED Notes (Signed)
 Patient resting in bed watching TV

## 2024-01-08 NOTE — ED Notes (Signed)
 Patient resting in bed chair in room at this time watching TV

## 2024-01-08 NOTE — ED Provider Notes (Signed)
 Emergency Medicine Observation Re-evaluation Note  Connie Snyder is a 89 y.o. female, seen on rounds today.  Pt initially presented to the ED for complaints of Chest Pain Currently, the patient is awaiting placement.  Physical Exam  BP 100/71 (BP Location: Left Arm)   Pulse 80   Temp 98.6 F (37 C)   Resp 16   SpO2 100%  Physical Exam Awake and in no acute distress  ED Course / MDM  EKG:EKG Interpretation Date/Time:  Wednesday December 06 2023 20:26:08 EDT Ventricular Rate:  99 PR Interval:  150 QRS Duration:  113 QT Interval:  374 QTC Calculation: 480 R Axis:   -37  Text Interpretation: Sinus rhythm LVH with IVCD, LAD and secondary repol abnrm Anterior ST elevation, probably due to LVH Confirmed by Laurice Coy [phone]) on 12/06/2023 8:39:58 PM  I have reviewed the labs performed to date as well as medications administered while in observation.  Recent changes in the last 24 hours include none.  Plan  Current plan is for placement.    Suzette Pac, MD 01/08/24 [phone]

## 2024-01-09 DIAGNOSIS — M7989 Other specified soft tissue disorders: Secondary | ICD-10-CM | POA: Diagnosis not present

## 2024-01-09 DIAGNOSIS — Z7901 Long term (current) use of anticoagulants: Secondary | ICD-10-CM | POA: Diagnosis not present

## 2024-01-09 DIAGNOSIS — R079 Chest pain, unspecified: Secondary | ICD-10-CM | POA: Diagnosis not present

## 2024-01-09 DIAGNOSIS — S42414A Nondisplaced simple supracondylar fracture without intercondylar fracture of right humerus, initial encounter for closed fracture: Secondary | ICD-10-CM | POA: Diagnosis not present

## 2024-01-09 NOTE — Progress Notes (Addendum)
 CSW attempted to contact pt's son to inquire about providing financial info to [facility]. Unable to leave vm. Will try back.   Addend @ 12:33PM Attempted to contact Marcus. CSW provided update to daughter and requested that she have Jerona give this Clinical research associate a call back to confirm whether he has submitted requested documentation.

## 2024-01-09 NOTE — ED Provider Notes (Signed)
 Emergency Medicine Observation Re-evaluation Note  Connie Snyder is a 89 y.o. female, seen on rounds today.  Pt initially presented to the ED for complaints of Chest Pain Currently, the patient is resting comfortably.  Physical Exam  BP 106/65 (BP Location: Left Arm)   Pulse (!) 56   Temp 97.6 F (36.4 C) (Oral)   Resp 16   SpO2 98%  Physical Exam  ED Course / MDM  EKG:EKG Interpretation Date/Time:  Wednesday December 06 2023 20:26:08 EDT Ventricular Rate:  99 PR Interval:  150 QRS Duration:  113 QT Interval:  374 QTC Calculation: 480 R Axis:   -37  Text Interpretation: Sinus rhythm LVH with IVCD, LAD and secondary repol abnrm Anterior ST elevation, probably due to LVH Confirmed by Laurice Coy [phone]) on 12/06/2023 8:39:58 PM  I have reviewed the labs performed to date as well as medications administered while in observation.  Recent changes in the last 24 hours include seen by palliative care.  No new recommendations made  Plan  Current plan is for placement.    Dasie Faden, MD 01/09/24 [phone]

## 2024-01-09 NOTE — ED Provider Notes (Signed)
 Emergency Medicine Observation Re-evaluation Note  Connie Snyder is a 89 y.o. female, seen on rounds today.  Pt initially presented to the ED for complaints of Chest Pain Currently, the patient is resting comfortably.  Physical Exam  BP 106/65 (BP Location: Left Arm)   Pulse (!) 56   Temp 97.6 F (36.4 C) (Oral)   Resp 16   SpO2 98%  Physical Exam   ED Course / MDM  EKG:EKG Interpretation Date/Time:  Wednesday December 06 2023 20:26:08 EDT Ventricular Rate:  99 PR Interval:  150 QRS Duration:  113 QT Interval:  374 QTC Calculation: 480 R Axis:   -37  Text Interpretation: Sinus rhythm LVH with IVCD, LAD and secondary repol abnrm Anterior ST elevation, probably due to LVH Confirmed by Laurice Coy [phone]) on 12/06/2023 8:39:58 PM  I have reviewed the labs performed to date as well as medications administered while in observation.  Recent changes in the last 24 hours include currently.  Plan  Current plan is for placement.    Dasie Faden, MD 01/09/24 [phone]

## 2024-01-10 DIAGNOSIS — R079 Chest pain, unspecified: Secondary | ICD-10-CM | POA: Diagnosis not present

## 2024-01-10 DIAGNOSIS — Z7901 Long term (current) use of anticoagulants: Secondary | ICD-10-CM | POA: Diagnosis not present

## 2024-01-10 DIAGNOSIS — S42414A Nondisplaced simple supracondylar fracture without intercondylar fracture of right humerus, initial encounter for closed fracture: Secondary | ICD-10-CM | POA: Diagnosis not present

## 2024-01-10 DIAGNOSIS — M7989 Other specified soft tissue disorders: Secondary | ICD-10-CM | POA: Diagnosis not present

## 2024-01-10 NOTE — ED Provider Notes (Signed)
 Emergency Medicine Observation Re-evaluation Note  Connie Snyder is a 89 y.o. female, seen on rounds today.  Pt initially presented to the ED for complaints of Chest Pain Currently, the patient is resting comfortably.  Physical Exam  BP (!) 119/96 (BP Location: Left Arm)   Pulse (!) 53   Temp 98.4 F (36.9 C) (Oral)   Resp 16   SpO2 100%  Physical Exam  General: nad Cardiac: good peripheral perfusion Lungs: bilateral chest rise Psych: resting comfortably  ED Course / MDM  EKG:EKG Interpretation Date/Time:  Wednesday December 06 2023 20:26:08 EDT Ventricular Rate:  99 PR Interval:  150 QRS Duration:  113 QT Interval:  374 QTC Calculation: 480 R Axis:   -37  Text Interpretation: Sinus rhythm LVH with IVCD, LAD and secondary repol abnrm Anterior ST elevation, probably due to LVH Confirmed by Laurice Coy [phone]) on 12/06/2023 8:39:58 PM  I have reviewed the labs performed to date as well as medications administered while in observation.  Recent changes in the last 24 hours include currently.  Plan  Current plan is for placement.      Emil Share, DO 01/10/24 [phone]

## 2024-01-10 NOTE — Progress Notes (Addendum)
 Son reports his phone was messed up yesterday and that he will provide this Clinical research associate an update at 11AM and that he is unable to talk now.  CSW outreached to Madelin Jacobus to inquire about whether pt submitted the requested information to [facility]. Awaiting response. If info has been submitted, will inquire about admission date.   Addend @ 10:31AM Tammy reports she has not rec'd information from [facility]. She did call him and he stated he was at PT appt until 11AM. CSW was informed by leadership that we are unable to continue to support drawn out process and suggested a deadline to submit requested info by noon today. CSW outreached to [facility] to reiterate the need to move swiftly with providing requested documentation or we would need to look at discharging pt home today with [facility]. Awaiting response.

## 2024-01-10 NOTE — Progress Notes (Signed)
 Information has been provided to Tammy by Jerona. CSW also provided Medicaid pending # and case workers name. Tammy will pass information along to business office who will reach out to Ms. Herelle to discuss Medicaid plans and Tammy will provide this Clinical research associate an update.   CSW sent secure email to Ms. Herelle inquiring about how copy of guardianship paperwork could be sent/received quickly.

## 2024-01-10 NOTE — Progress Notes (Signed)
 CSW was provided copy of guardianship hearing paperwork. CSW will email to [facility].

## 2024-01-11 DIAGNOSIS — R079 Chest pain, unspecified: Secondary | ICD-10-CM | POA: Diagnosis not present

## 2024-01-11 DIAGNOSIS — S42414A Nondisplaced simple supracondylar fracture without intercondylar fracture of right humerus, initial encounter for closed fracture: Secondary | ICD-10-CM | POA: Diagnosis not present

## 2024-01-11 DIAGNOSIS — M7989 Other specified soft tissue disorders: Secondary | ICD-10-CM | POA: Diagnosis not present

## 2024-01-11 DIAGNOSIS — Z7901 Long term (current) use of anticoagulants: Secondary | ICD-10-CM | POA: Diagnosis not present

## 2024-01-11 NOTE — Progress Notes (Addendum)
 Copy of guardianship hearing has been sent via secure email to Ms. Herelle, Medicaid caseworker.   Awaiting update from Madelin Jacobus on clinical review by [facility].

## 2024-01-11 NOTE — ED Provider Notes (Signed)
 Emergency Medicine Observation Re-evaluation Note  Connie Snyder is a 89 y.o. female, seen on rounds today.  Pt initially presented to the ED for complaints of Chest Pain Currently, the patient is resting  Physical Exam  BP 110/68 (BP Location: Left Arm)   Pulse (!) 55   Temp 97.8 F (36.6 C) (Axillary)   Resp 17   SpO2 100%  Physical Exam General: NAD Cardiac: RR Lungs: non labored  Psych: calm   ED Course / MDM  EKG:EKG Interpretation Date/Time:  Wednesday December 06 2023 20:26:08 EDT Ventricular Rate:  99 PR Interval:  150 QRS Duration:  113 QT Interval:  374 QTC Calculation: 480 R Axis:   -37  Text Interpretation: Sinus rhythm LVH with IVCD, LAD and secondary repol abnrm Anterior ST elevation, probably due to LVH Confirmed by Laurice Coy [phone]) on 12/06/2023 8:39:58 PM  I have reviewed the labs performed to date as well as medications administered while in observation.  Recent changes in the last 24 hours include none.  Plan  Current plan is for placement per TOC.    Neysa Caron JINNY, DO 01/11/24 [phone]

## 2024-01-11 NOTE — ED Notes (Signed)
 Patient has been resting comfortably this shift. Patient ate breakfast and lunch. Patient medication compliant.

## 2024-01-11 NOTE — Progress Notes (Signed)
 CSW spoke with Connie Snyder who reports he will be renting a car to take the bank statements to [facility] in the morning. He reports he does not have access to the physical policy for the pt but does have bank statements where monthly withdrawals take place.   CSW informed Tammy who has notified business team at [place]. ICM will work with Madelin Jacobus to provide what is needed once confirmed son has provide the statements.

## 2024-01-12 DIAGNOSIS — Z7901 Long term (current) use of anticoagulants: Secondary | ICD-10-CM | POA: Diagnosis not present

## 2024-01-12 DIAGNOSIS — R079 Chest pain, unspecified: Secondary | ICD-10-CM | POA: Diagnosis not present

## 2024-01-12 DIAGNOSIS — S42414A Nondisplaced simple supracondylar fracture without intercondylar fracture of right humerus, initial encounter for closed fracture: Secondary | ICD-10-CM | POA: Diagnosis not present

## 2024-01-12 DIAGNOSIS — M7989 Other specified soft tissue disorders: Secondary | ICD-10-CM | POA: Diagnosis not present

## 2024-01-12 NOTE — TOC CM/SW Note (Signed)
 CM was consulted this afternoon to arrange for DME and HH. HH PT, OT referral accepted by [facility]. DME referral accepted by [facility].   At this time [facility] has been unable to contact pt's son to arrange for the delivery of the hospital bed and BSC. [facility]'s delivery coordinator and delivery driver have both attempted to contact the son. [facility] plans to deliver the equipment first thing tomorrow morning.  This Clinical research associate spoke c/pt's son, Jerona, by phone and he claimed he was not aware of a delivery and he could not be at the house until 10:30 PM tonight. Per Jerona, his plan is for his sister to arrive in town this Sunday and they would prepare for his mother's discharge on Monday.  I informed him that the delivery will take place tomorrow morning and his mother will be discharged home shortly after. If he does not make himself available it will be considered abandonment and APS will be called.   CM placed a consult for palliative home care. [facility] is to evaluate pt tomorrow.

## 2024-01-12 NOTE — ED Provider Notes (Signed)
 Son is wanting to take the patient home.  Social work is recommending home health orders for PT, OT, RN.  3 and 1 bedside commode and hospital bed have also been ordered per their recommendations.  Son will pick patient up.   Freddi Hamilton, MD 01/12/24 [phone]

## 2024-01-12 NOTE — ED Provider Notes (Signed)
 Emergency Medicine Observation Re-evaluation Note  Connie Snyder is a 89 y.o. female, seen on rounds today.  Pt initially presented to the ED for complaints of Chest Pain Currently, the patient is asleep.  Physical Exam  BP 108/60 (BP Location: Left Arm)   Pulse (!) 52   Temp 97.8 F (36.6 C) (Axillary)   Resp 16   SpO2 98%  Physical Exam General: asleep Cardiac: asleep Lungs: asleep Psych: asleep  ED Course / MDM  EKG:EKG Interpretation Date/Time:  Wednesday December 06 2023 20:26:08 EDT Ventricular Rate:  99 PR Interval:  150 QRS Duration:  113 QT Interval:  374 QTC Calculation: 480 R Axis:   -37  Text Interpretation: Sinus rhythm LVH with IVCD, LAD and secondary repol abnrm Anterior ST elevation, probably due to LVH Confirmed by Laurice Coy [phone]) on 12/06/2023 8:39:58 PM  I have reviewed the labs performed to date as well as medications administered while in observation.  No recent changes in the last 24 hours.  Plan  Current plan is for placement.    Freddi Hamilton, MD 01/12/24 [phone]

## 2024-01-12 NOTE — Progress Notes (Addendum)
 Spoke to pt's son Jerona who confirmed he is currently on the way to [facility], ETA 30 min. Debbie at [facility] aware and is waiting to meet with him today.   1400: Call received from Debbie with [facility] who reports pt's son met with her and their Loss adjuster, chartered today. Per [facility], when they discussed relinquishing pt's social security and pension checks to the facility, pt's son stated this was the first he heard of this financial requirement and refused. Call placed to pt's son and he confirmed unwilling to relinquish pt's checks. Pt's son mentioned resubmitting auth with [employer]. SW explained that pt has adamantly refused to work with therapy and they signed off 9/26 after multiple attempts. Pt's son verbalized understanding and is requesting HHPT/OT/RN, BSC, and hospital bed. CM made aware of HH/DME needs.   Julien Das, MSW, LCSW [phone] (coverage)

## 2024-01-13 DIAGNOSIS — M7989 Other specified soft tissue disorders: Secondary | ICD-10-CM | POA: Diagnosis not present

## 2024-01-13 DIAGNOSIS — R079 Chest pain, unspecified: Secondary | ICD-10-CM | POA: Diagnosis not present

## 2024-01-13 DIAGNOSIS — S42414A Nondisplaced simple supracondylar fracture without intercondylar fracture of right humerus, initial encounter for closed fracture: Secondary | ICD-10-CM | POA: Diagnosis not present

## 2024-01-13 DIAGNOSIS — Z7901 Long term (current) use of anticoagulants: Secondary | ICD-10-CM | POA: Diagnosis not present

## 2024-01-13 NOTE — ED Provider Notes (Signed)
 Emergency Medicine Observation Re-evaluation Note  Connie Snyder is a 89 y.o. female, seen on rounds today.  Pt initially presented to the ED for complaints of Chest Pain Currently, the patient is sleeping.  Physical Exam  BP 120/77 (BP Location: Left Arm)   Pulse (!) 58   Temp 97.6 F (36.4 C) (Oral)   Resp 18   SpO2 100%  Physical Exam General: NAD, sleeping Cardiac: well perfused  Lungs: no respiratory distress  Psych: resting comfortably  ED Course / MDM  EKG:EKG Interpretation Date/Time:  Wednesday December 06 2023 20:26:08 EDT Ventricular Rate:  99 PR Interval:  150 QRS Duration:  113 QT Interval:  374 QTC Calculation: 480 R Axis:   -37  Text Interpretation: Sinus rhythm LVH with IVCD, LAD and secondary repol abnrm Anterior ST elevation, probably due to LVH Confirmed by Laurice Coy [phone]) on 12/06/2023 8:39:58 PM  I have reviewed the labs performed to date as well as medications administered while in observation.  Recent changes in the last 24 hours include none.  Plan  Current plan is for further observation until safe disposition home with home health or to skilled nursing facility.    Ula Prentice SAUNDERS, MD 01/13/24 [phone]

## 2024-01-13 NOTE — ED Notes (Signed)
 Verified [facility] transport

## 2024-01-13 NOTE — ED Notes (Signed)
 Son called asking when Connie Snyder will be transported home, notified that [name] has been called and we are waiting for them to pick her up.

## 2024-01-13 NOTE — Care Management (Addendum)
 Called Connie Snyder from [facility] to follow up on patient. He is calling son and will let us  know status Contact made with son and [facility] the bed will be delivered after 2pm 1550 The patients son texted  this RNCM  to let us  know that the bed was at the house.

## 2024-01-13 NOTE — Progress Notes (Signed)
 [name] [phone] -- [facility]?Hospital Liaison note:??   ???   Notified by Corean SINNING manager of patient/family request for [facility] services at home after discharge.????????   ????   Hospital liaison will follow patient for discharge disposition.?   ????   Please call with any hospice or outpatient palliative care related questions.???   ????   Thank you for the opportunity to participate in this patient's care.   Nat Babe, BSN, RN [employer] [phone]

## 2024-01-13 NOTE — ED Notes (Signed)
 Pt has been provided peri care and turned onto opposite side.

## 2024-01-13 NOTE — ED Provider Notes (Signed)
.     Suzette Pac, MD 01/13/24 1601

## 2024-01-17 DIAGNOSIS — S42411A Displaced simple supracondylar fracture without intercondylar fracture of right humerus, initial encounter for closed fracture: Secondary | ICD-10-CM | POA: Diagnosis not present

## 2024-01-17 DIAGNOSIS — I700 Atherosclerosis of aorta: Secondary | ICD-10-CM | POA: Diagnosis not present

## 2024-01-17 DIAGNOSIS — M069 Rheumatoid arthritis, unspecified: Secondary | ICD-10-CM | POA: Diagnosis not present

## 2024-01-17 DIAGNOSIS — R600 Localized edema: Secondary | ICD-10-CM | POA: Diagnosis not present

## 2024-01-17 DIAGNOSIS — D508 Other iron deficiency anemias: Secondary | ICD-10-CM | POA: Diagnosis not present

## 2024-01-24 ENCOUNTER — Ambulatory Visit: Admitting: Podiatry

## 2024-01-25 DIAGNOSIS — R600 Localized edema: Secondary | ICD-10-CM | POA: Diagnosis not present

## 2024-01-25 DIAGNOSIS — Z86711 Personal history of pulmonary embolism: Secondary | ICD-10-CM | POA: Diagnosis not present

## 2024-01-25 DIAGNOSIS — I38 Endocarditis, valve unspecified: Secondary | ICD-10-CM | POA: Diagnosis not present

## 2024-01-25 DIAGNOSIS — M3500 Sicca syndrome, unspecified: Secondary | ICD-10-CM | POA: Diagnosis not present

## 2024-01-25 DIAGNOSIS — M069 Rheumatoid arthritis, unspecified: Secondary | ICD-10-CM | POA: Diagnosis not present

## 2024-01-25 DIAGNOSIS — I700 Atherosclerosis of aorta: Secondary | ICD-10-CM | POA: Diagnosis not present

## 2024-01-25 DIAGNOSIS — D508 Other iron deficiency anemias: Secondary | ICD-10-CM | POA: Diagnosis not present

## 2024-02-08 ENCOUNTER — Ambulatory Visit: Admitting: Podiatry

## 2024-02-08 ENCOUNTER — Encounter: Payer: Self-pay | Admitting: Podiatry

## 2024-02-08 DIAGNOSIS — R410 Disorientation, unspecified: Secondary | ICD-10-CM | POA: Diagnosis not present

## 2024-02-08 DIAGNOSIS — M79675 Pain in left toe(s): Secondary | ICD-10-CM

## 2024-02-08 DIAGNOSIS — B351 Tinea unguium: Secondary | ICD-10-CM

## 2024-02-08 DIAGNOSIS — M79674 Pain in right toe(s): Secondary | ICD-10-CM | POA: Diagnosis not present

## 2024-02-08 NOTE — Progress Notes (Signed)
 This patient presents to the office with chief complaint of long thick painful nails.  Patient says the nails are painful walking and wearing shoes.  This patient is unable to self treat.  This patient is unable to trim her nails since she is unable to reach her nails. She presents to the office with her son. Patient has not been seen in over 1 year.  She presents to the office for preventative foot care services.  General Appearance  Alert, conversant and in no acute stress.  Vascular  Dorsalis pedis and posterior tibial  pulses are weakly  palpable  bilaterally.  Capillary return is within normal limits  bilaterally. Temperature is within normal limits  bilaterally.  Neurologic  Senn-Weinstein monofilament wire test within normal limits  bilaterally. Muscle power within normal limits bilaterally.  Nails Thick disfigured discolored nails with subungual debris  from hallux to fifth toes bilaterally. No evidence of bacterial infection or drainage bilaterally.  Orthopedic  No limitations of motion  feet .  No crepitus or effusions noted.  No bony pathology or digital deformities noted.  Skin  normotropic skin with no porokeratosis noted bilaterally.  No signs of infections or ulcers noted.     Onychomycosis  Nails  B/L.  Pain in right toes  Pain in left toes  Debridement of nails both feet followed trimming the nails with dremel tool.    RTC 6 months.   Cordella Bold DPM

## 2024-04-06 ENCOUNTER — Other Ambulatory Visit: Payer: Self-pay

## 2024-04-06 ENCOUNTER — Encounter (HOSPITAL_BASED_OUTPATIENT_CLINIC_OR_DEPARTMENT_OTHER): Payer: Self-pay | Admitting: Emergency Medicine

## 2024-04-06 ENCOUNTER — Emergency Department (HOSPITAL_BASED_OUTPATIENT_CLINIC_OR_DEPARTMENT_OTHER)
Admission: EM | Admit: 2024-04-06 | Discharge: 2024-04-06 | Disposition: A | Discharge status: Home / Self Care | Attending: Emergency Medicine | Admitting: Emergency Medicine

## 2024-04-06 DIAGNOSIS — R55 Syncope and collapse: Secondary | ICD-10-CM | POA: Insufficient documentation

## 2024-04-06 DIAGNOSIS — R5383 Other fatigue: Secondary | ICD-10-CM | POA: Insufficient documentation

## 2024-04-06 DIAGNOSIS — R400 Somnolence: Secondary | ICD-10-CM | POA: Insufficient documentation

## 2024-04-06 DIAGNOSIS — Z7901 Long term (current) use of anticoagulants: Secondary | ICD-10-CM | POA: Insufficient documentation

## 2024-04-06 LAB — URINALYSIS, ROUTINE W REFLEX MICROSCOPIC
Bilirubin Urine: NEGATIVE
Glucose, UA: NEGATIVE mg/dL
Hgb urine dipstick: NEGATIVE
Ketones, ur: NEGATIVE mg/dL
Nitrite: NEGATIVE
Protein, ur: NEGATIVE mg/dL
Specific Gravity, Urine: 1.01 (ref 1.005–1.030)
pH: 6.5 (ref 5.0–8.0)

## 2024-04-06 LAB — COMPREHENSIVE METABOLIC PANEL WITH GFR
ALT: 10 U/L (ref 0–44)
AST: 27 U/L (ref 15–41)
Albumin: 4.1 g/dL (ref 3.5–5.0)
Alkaline Phosphatase: 80 U/L (ref 38–126)
Anion gap: 13 (ref 5–15)
BUN: 22 mg/dL (ref 8–23)
CO2: 24 mmol/L (ref 22–32)
Calcium: 9.9 mg/dL (ref 8.9–10.3)
Chloride: 104 mmol/L (ref 98–111)
Creatinine, Ser: 0.57 mg/dL (ref 0.44–1.00)
GFR, Estimated: >60 mL/min
Glucose, Bld: 116 mg/dL — ABNORMAL HIGH (ref 70–99)
Potassium: 3.9 mmol/L (ref 3.5–5.1)
Sodium: 140 mmol/L (ref 135–145)
Total Bilirubin: 0.3 mg/dL (ref 0.0–1.2)
Total Protein: 7.8 g/dL (ref 6.5–8.1)

## 2024-04-06 LAB — CBC
HCT: 34 % — ABNORMAL LOW (ref 36.0–46.0)
Hemoglobin: 11.8 g/dL — ABNORMAL LOW (ref 12.0–15.0)
MCH: 27.5 pg (ref 26.0–34.0)
MCHC: 34.7 g/dL (ref 30.0–36.0)
MCV: 79.3 fL — ABNORMAL LOW (ref 80.0–100.0)
Platelets: 322 K/uL (ref 150–400)
RBC: 4.29 MIL/uL (ref 3.87–5.11)
RDW: 15.8 % — ABNORMAL HIGH (ref 11.5–15.5)
WBC: 6.8 K/uL (ref 4.0–10.5)
nRBC: 0 % (ref 0.0–0.2)

## 2024-04-06 LAB — URINALYSIS, MICROSCOPIC (REFLEX): Bacteria, UA: NONE SEEN

## 2024-04-06 MED ORDER — ELIQUIS 2.5 MG PO TABS: 2.5000 mg | ORAL_TABLET | Freq: Two times a day (BID) | ORAL | 0 refills | Status: AC

## 2024-04-06 NOTE — ED Provider Notes (Signed)
 " [place] EMERGENCY DEPARTMENT AT [facility] Provider Note   CSN: [id number] Arrival date & time: 04/06/24  0034     Patient presents with: Near Syncope   Connie Snyder is a 89 y.o. female.   HPI     This is a 89 year old female who presents with her son with concerns for increasing sleepiness and fatigue.  Per the patient's son, she has had increasing sleepiness and fatigue over the last 24 to 48 hours.  He does endorse that they have been very busy at home and she has been more active than normal.  He also states that over the last 5 days he has given her Atarax  on a daily basis and in anticipation of a road trip.  She has some anxiety related to road trips.  She does not normally take this daily.  She is also taking some Tylenol  PM at night.  He has noticed that during the day she is falling asleep quickly and sleeping longer.  Has not noted any strokelike symptoms.  No recent fevers or illnesses.  She is not having any complaints and when she wakes up she seems to be normal.  Prior to Admission medications  Medication Sig Start Date End Date Taking? Authorizing Provider  ELIQUIS  2.5 MG TABS tablet Take 1 tablet (2.5 mg total) by mouth 2 (two) times daily. 04/06/24   Orly Quimby, Charmaine FALCON, MD  feeding supplement (BOOST HIGH PROTEIN) LIQD Take 1 Container by mouth 2 (two) times daily between meals.    [provider]  ferrous sulfate  325 (65 FE) MG EC tablet Take 1 tablet (325 mg total) by mouth daily with breakfast. 04/04/23 12/08/23  Regalado, Belkys A, MD  folic acid  (FOLVITE ) 1 MG tablet Take 1 tablet (1 mg total) by mouth daily. 04/05/23   Regalado, Belkys A, MD  furosemide  (LASIX ) 20 MG tablet Take 20 mg by mouth daily as needed for fluid.    [provider]  HYDROcodone -acetaminophen  (NORCO/VICODIN) 5-325 MG tablet Take 1 tablet by mouth every 6 (six) hours as needed. 12/06/23   Laurice Maude BROCKS, MD  hydrOXYzine  (ATARAX ) 25 MG tablet Take 25 mg by mouth daily  as needed for anxiety. 08/18/23   [provider]  mirtazapine  (REMERON ) 15 MG tablet TAKE 1 TABLET BY MOUTH EVERY DAY AT BEDTIME FOR 90 DAYS for 90    [provider]  Multiple Vitamins-Minerals (MULTIVITAMIN WOMEN 50+) TABS Take 1 tablet by mouth daily with breakfast.    [provider]    Allergies: Aricept  [donepezil ]    Review of Systems  Constitutional:  Positive for fatigue. Negative for fever.  Respiratory:  Negative for shortness of breath.   Cardiovascular:  Negative for chest pain.  Gastrointestinal:  Negative for abdominal pain, nausea and vomiting.  All other systems reviewed and are negative.   Updated Vital Signs BP 100/68 (BP Location: Right Arm)   Pulse 76   Temp 98 F (36.7 C) (Oral)   Resp 18   Wt 47 kg   SpO2 99%   BMI 17.79 kg/m   Physical Exam Vitals and nursing note reviewed.  Constitutional:      Appearance: She is well-developed.     Comments: Oriented to self, no acute distress, elderly and frail-appearing  HENT:     Head: Normocephalic and atraumatic.     Mouth/Throat:     Mouth: Mucous membranes are moist.  Eyes:     Pupils: Pupils are equal, round, and reactive to  light.  Cardiovascular:     Rate and Rhythm: Normal rate and regular rhythm.     Heart sounds: Normal heart sounds.  Pulmonary:     Effort: Pulmonary effort is normal. No respiratory distress.  Abdominal:     Palpations: Abdomen is soft.     Tenderness: There is no abdominal tenderness.  Musculoskeletal:     Cervical back: Neck supple.  Skin:    General: Skin is warm and dry.  Neurological:     Mental Status: She is alert.     Comments: Oriented to self, nerves II through XII intact, follows simple commands, symmetric strength in all 4 extremities  Psychiatric:        Mood and Affect: Mood normal.     (all labs ordered are listed, but only abnormal results are displayed) Labs Reviewed  COMPREHENSIVE METABOLIC PANEL WITH GFR - Abnormal; Notable  for the following components:      Result Value   Glucose, Bld 116 (*)    All other components within normal limits  CBC - Abnormal; Notable for the following components:   Hemoglobin 11.8 (*)    HCT 34.0 (*)    MCV 79.3 (*)    RDW 15.8 (*)    All other components within normal limits  URINALYSIS, ROUTINE W REFLEX MICROSCOPIC - Abnormal; Notable for the following components:   Leukocytes,Ua TRACE (*)    All other components within normal limits  URINALYSIS, MICROSCOPIC (REFLEX)  CBG MONITORING, ED    EKG: EKG Interpretation Date/Time:  Saturday April 06 2024 00:56:40 EST Ventricular Rate:  101 PR Interval:  160 QRS Duration:  112 QT Interval:  374 QTC Calculation: 484 R Axis:   -40  Text Interpretation: Sinus tachycardia Left axis deviation Left ventricular hypertrophy with repolarization abnormality ( R in aVL , Cornell product , Romhilt-Estes ) Inferior infarct , age undetermined Anterolateral infarct , age undetermined Abnormal ECG When compared with ECG of 06-Dec-2023 20:26, PREVIOUS ECG IS PRESENT similar to prior Confirmed by Bari Pfeiffer ([id number]) on 04/06/2024 1:03:14 AM  Radiology: No results found.   Procedures   Medications Ordered in the ED - No data to display                                  Medical Decision Making Amount and/or Complexity of Data Reviewed Labs: ordered.  Risk Prescription drug management.   This patient presents to the ED for concern of fatigue, this involves an extensive number of treatment options, and is a complaint that carries with it a high risk of complications and morbidity.  I considered the following differential and admission for this acute, potentially life threatening condition.  The differential diagnosis includes infection, metabolic derangement, UTI, medication related  MDM:    This is a 89 year old female who presents with her son with concern for increasing fatigue.  He reports increased activity but also addition  of Atarax  and Tylenol  PM in the last several days.  She is overall nontoxic and vital signs are reassuring.  She is afebrile.  She does not appear dehydrated.  She follows commands appropriately.  Is only oriented to herself.  Son states that that would be normal for this time of the day.  She is nonfocal.  Low suspicion for acute stroke.  She is taking several sedating medications which could be culprit.  CBC reassuring.  CMP without metabolic derangement.  Urinalysis without evidence of  ketones or UTI.  Overall reassuring.  Suspect that some of this may be related to medications although if she has had a stressful or busy couple of days, she could just be generally fatigued from this as well.  Recommend avoiding Atarax  and Benadryl .  Close follow-up with PCP recommended if not improving in the next several days.  Son is requesting refill of Eliquis .  She ran out and they have not been able to get up with her office for a refill.  This was provided.  (Labs, imaging, consults)  Labs: I Ordered, and personally interpreted labs.  The pertinent results include: CBC, CMP, urinalysis  Imaging Studies ordered: I ordered imaging studies including none I independently visualized and interpreted imaging. I agree with the radiologist interpretation  Additional history obtained from chart review.  External records from outside source obtained and reviewed including prior evaluations  Cardiac Monitoring: The patient was maintained on a cardiac monitor.  If on the cardiac monitor, I personally viewed and interpreted the cardiac monitored which showed an underlying rhythm of: N/A  Reevaluation: After the interventions noted above, I reevaluated the patient and found that they have :stayed the same  Social Determinants of Health:  lives with son Disposition: Discharge  Co morbidities that complicate the patient evaluation  Past Medical History:  Diagnosis Date   Arthritis    Pulmonary embolism (HCC)     Sjogren's disease      Medicines Meds ordered this encounter  Medications   ELIQUIS  2.5 MG TABS tablet    Sig: Take 1 tablet (2.5 mg total) by mouth 2 (two) times daily.    Dispense:  60 tablet    Refill:  0    I have reviewed the patients home medicines and have made adjustments as needed  Problem List / ED Course: Problem List Items Addressed This Visit   None Visit Diagnoses       Other fatigue    -  Primary                Final diagnoses:  Other fatigue    ED Discharge Orders          Ordered    ELIQUIS  2.5 MG TABS tablet  2 times daily        04/06/24 0447               Anjelika Ausburn, Charmaine FALCON, MD 04/06/24 0457  "

## 2024-04-06 NOTE — Discharge Instructions (Signed)
 You are seen today for increasing fatigue.  Avoid sedating medications including hydroxyzine  and Benadryl .  If not improving over the next several days, follow-up closely with PCP.

## 2024-04-06 NOTE — ED Triage Notes (Signed)
 Patient here with son, c/o near syncopal episode while walking from the restroom.  Patient's son reports patient has been more tired today than normal.  Patient's son reports patient ate dinner and woke up more, but is right back to being more tired than normal. Patient's son reports his mother is normally active during the day.  Patient has been taking hydroxizine x 5 days due to upcoming trip.

## 2024-04-17 ENCOUNTER — Emergency Department (HOSPITAL_BASED_OUTPATIENT_CLINIC_OR_DEPARTMENT_OTHER)
Admission: EM | Admit: 2024-04-17 | Discharge: 2024-04-18 | Disposition: A | Discharge status: Home / Self Care | Attending: Emergency Medicine | Admitting: Emergency Medicine

## 2024-04-17 ENCOUNTER — Encounter (HOSPITAL_BASED_OUTPATIENT_CLINIC_OR_DEPARTMENT_OTHER): Payer: Self-pay

## 2024-04-17 ENCOUNTER — Other Ambulatory Visit: Payer: Self-pay

## 2024-04-17 DIAGNOSIS — R55 Syncope and collapse: Secondary | ICD-10-CM | POA: Insufficient documentation

## 2024-04-17 DIAGNOSIS — Z7901 Long term (current) use of anticoagulants: Secondary | ICD-10-CM | POA: Insufficient documentation

## 2024-04-17 DIAGNOSIS — F0390 Unspecified dementia without behavioral disturbance: Secondary | ICD-10-CM | POA: Insufficient documentation

## 2024-04-17 LAB — URINALYSIS, ROUTINE W REFLEX MICROSCOPIC
Bilirubin Urine: NEGATIVE
Glucose, UA: NEGATIVE mg/dL
Hgb urine dipstick: NEGATIVE
Ketones, ur: NEGATIVE mg/dL
Nitrite: NEGATIVE
Protein, ur: NEGATIVE mg/dL
Specific Gravity, Urine: 1.02 (ref 1.005–1.030)
pH: 7 (ref 5.0–8.0)

## 2024-04-17 LAB — COMPREHENSIVE METABOLIC PANEL WITH GFR
ALT: 17 U/L (ref 0–44)
AST: 32 U/L (ref 15–41)
Albumin: 4.1 g/dL (ref 3.5–5.0)
Alkaline Phosphatase: 81 U/L (ref 38–126)
Anion gap: 12 (ref 5–15)
BUN: 17 mg/dL (ref 8–23)
CO2: 24 mmol/L (ref 22–32)
Calcium: 9.8 mg/dL (ref 8.9–10.3)
Chloride: 103 mmol/L (ref 98–111)
Creatinine, Ser: 0.66 mg/dL (ref 0.44–1.00)
GFR, Estimated: >60 mL/min
Glucose, Bld: 114 mg/dL — ABNORMAL HIGH (ref 70–99)
Potassium: 4.1 mmol/L (ref 3.5–5.1)
Sodium: 138 mmol/L (ref 135–145)
Total Bilirubin: 0.4 mg/dL (ref 0.0–1.2)
Total Protein: 7.5 g/dL (ref 6.5–8.1)

## 2024-04-17 LAB — CBC
HCT: 32.1 % — ABNORMAL LOW (ref 36.0–46.0)
Hemoglobin: 11.3 g/dL — ABNORMAL LOW (ref 12.0–15.0)
MCH: 27.6 pg (ref 26.0–34.0)
MCHC: 35.2 g/dL (ref 30.0–36.0)
MCV: 78.3 fL — ABNORMAL LOW (ref 80.0–100.0)
Platelets: 365 K/uL (ref 150–400)
RBC: 4.1 MIL/uL (ref 3.87–5.11)
RDW: 15.4 % (ref 11.5–15.5)
WBC: 7.8 K/uL (ref 4.0–10.5)
nRBC: 0 % (ref 0.0–0.2)

## 2024-04-17 MED ORDER — SODIUM CHLORIDE 0.9 % IV BOLUS
500.0000 mL | Freq: Once | INTRAVENOUS | Status: AC
  Administered 2024-04-17: 500 mL via INTRAVENOUS

## 2024-04-17 NOTE — ED Triage Notes (Signed)
 Pt presents via POV c/o syncopal episode while eating dinner. Son reports patient had 2 episodes of syncope that last apx 5-10secs.   Reports seen recently for the same thing.

## 2024-04-17 NOTE — ED Provider Notes (Addendum)
 " [place] EMERGENCY DEPARTMENT AT [facility] Provider Note   CSN: [id number] Arrival date & time: 04/17/24  8076     Patient presents with: Near Syncope   Connie Snyder is a 89 y.o. female.  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  Patient had 2 witnessed syncopal episodes at home as described by her son.  She had been in her normal state of health earlier today.  Son notes that she may be dehydrated as he gave her 2 doses of Lasix  in the last couple days because of the swelling in her legs.  No reported headaches chest pain fevers chills shortness of breath.  No subsequent falls or injury    Near Syncope       Prior to Admission medications  Medication Sig Start Date End Date Taking? Authorizing Provider  ELIQUIS  2.5 MG TABS tablet Take 1 tablet (2.5 mg total) by mouth 2 (two) times daily. 04/06/24   Horton, Charmaine FALCON, MD  feeding supplement (BOOST HIGH PROTEIN) LIQD Take 1 Container by mouth 2 (two) times daily between meals.    [provider]  ferrous sulfate  325 (65 FE) MG EC tablet Take 1 tablet (325 mg total) by mouth daily with breakfast. 04/04/23 12/08/23  Regalado, Belkys A, MD  folic acid  (FOLVITE ) 1 MG tablet Take 1 tablet (1 mg total) by mouth daily. 04/05/23   Regalado, Belkys A, MD  furosemide  (LASIX ) 20 MG tablet Take 20 mg by mouth daily as needed for fluid.    [provider]  HYDROcodone -acetaminophen  (NORCO/VICODIN) 5-325 MG tablet Take 1 tablet by mouth every 6 (six) hours as needed. 12/06/23   Laurice Maude BROCKS, MD  hydrOXYzine  (ATARAX ) 25 MG tablet Take 25 mg by mouth daily as needed for anxiety. 08/18/23   [provider]  mirtazapine  (REMERON ) 15 MG tablet TAKE 1 TABLET BY MOUTH EVERY DAY AT BEDTIME FOR 90 DAYS for 90    [provider]  Multiple Vitamins-Minerals (MULTIVITAMIN WOMEN 50+) TABS Take 1 tablet by mouth daily with breakfast.     [provider]    Allergies: Aricept  [donepezil ]    Review of Systems  Cardiovascular:  Positive for near-syncope.    Updated Vital Signs BP 96/68 (BP Location: Right Arm)   Pulse 88   Temp 98.2 F (36.8 C) (Oral)   Resp 16   SpO2 97%   Physical Exam Vitals and nursing note reviewed.  HENT:     Head: Normocephalic and atraumatic.  Eyes:     Pupils: Pupils are equal, round, and reactive to light.  Cardiovascular:     Rate and Rhythm: Normal rate and regular rhythm.  Pulmonary:     Effort: Pulmonary effort is normal.     Breath sounds: Normal breath sounds.  Abdominal:     Palpations: Abdomen is soft.     Tenderness: There is no abdominal tenderness.  Skin:    General: Skin is warm and dry.  Neurological:     Mental Status: She is alert.     Sensory: No sensory deficit.     Motor: No weakness.  Psychiatric:        Mood and Affect: Mood normal.     (all labs ordered are listed, but only abnormal results are displayed) Labs Reviewed  COMPREHENSIVE METABOLIC PANEL WITH GFR - Abnormal; Notable for the following components:      Result Value  Glucose, Bld 114 (*)    All other components within normal limits  CBC - Abnormal; Notable for the following components:   Hemoglobin 11.3 (*)    HCT 32.1 (*)    MCV 78.3 (*)    All other components within normal limits  URINALYSIS, ROUTINE W REFLEX MICROSCOPIC - Abnormal; Notable for the following components:   APPearance HAZY (*)    Leukocytes,Ua MODERATE (*)    Bacteria, UA RARE (*)    Non Squamous Epithelial 0-5 (*)    All other components within normal limits    EKG: EKG Interpretation Date/Time:  Wednesday April 17 2024 19:38:48 EST Ventricular Rate:  96 PR Interval:  174 QRS Duration:  110 QT Interval:  368 QTC Calculation: 464 R Axis:   -58  Text Interpretation: Normal sinus rhythm Left axis deviation Minimal voltage criteria for LVH, may be normal variant ( Cornell product ) Anterolateral  infarct (cited on or before 06-Apr-2024) Abnormal ECG When compared with ECG of 06-Apr-2024 00:56, Questionable change in initial forces of Lateral leads Confirmed by [name] [phone]) on 04/17/2024 9:24:39 PM  Radiology: No results found.   Procedures   Medications Ordered in the ED  sodium chloride  0.9 % bolus 500 mL (0 mLs Intravenous Stopped 04/17/24 2338)    Clinical Course as of 04/17/24 2353  Wed Apr 17, 2024  2352 No evidence of dysrhythmia on EKG.  Laboratory workup unremarkable does show microcytic anemia which she has a history of.  UA is not consistent with UTI.  Patient has remained stable appropriate for discharge with PCP follow-up.  Son who cares for her is in agreement [MP]    Clinical Course User Index [MP] [name] LABOR, DO                                 Medical Decision Making 89 year old female presenting after syncopal episodes at home witnessed by her son.  Does have a history of vasovagal syncope but there are some concern for dehydration.  No respiratory symptoms no infectious symptoms.  Will obtain laboratory workup provide gentle rehydration with IV fluids and obtain urinalysis to look for UTI.  Low suspicion for stroke as she has no focal neurologic deficits and is not reporting any headaches  Amount and/or Complexity of Data Reviewed Labs: ordered.        Final diagnoses:  Syncope, unspecified syncope type    ED Discharge Orders     None          [name] LABOR, DO 04/17/24 2329    [name] LABOR, DO 04/17/24 2353  "

## 2024-04-17 NOTE — Discharge Instructions (Signed)
 Connie Snyder was seen in the emergency department after 2 episodes of fainting at home Her blood work looks okay Urine test was negative for UTI We gave her IV fluids and there were no more fainting episodes Keep her well-hydrated and follow-up with her primary doctor Continue taking all previous prescribed medications

## 2024-08-07 ENCOUNTER — Ambulatory Visit: Admitting: Podiatry
